# Patient Record
Sex: Female | Born: 1940
Health system: Southern US, Community
[De-identification: ages and names within clinical notes are randomized; demographics above are authoritative.]

## PROBLEM LIST (undated history)

## (undated) DIAGNOSIS — H409 Unspecified glaucoma: Secondary | ICD-10-CM

## (undated) DIAGNOSIS — R002 Palpitations: Secondary | ICD-10-CM

## (undated) DIAGNOSIS — J189 Pneumonia, unspecified organism: Secondary | ICD-10-CM

## (undated) DIAGNOSIS — F419 Anxiety disorder, unspecified: Secondary | ICD-10-CM

## (undated) DIAGNOSIS — K219 Gastro-esophageal reflux disease without esophagitis: Secondary | ICD-10-CM

## (undated) DIAGNOSIS — G43909 Migraine, unspecified, not intractable, without status migrainosus: Secondary | ICD-10-CM

## (undated) DIAGNOSIS — I499 Cardiac arrhythmia, unspecified: Secondary | ICD-10-CM

## (undated) DIAGNOSIS — Z87442 Personal history of urinary calculi: Secondary | ICD-10-CM

## (undated) DIAGNOSIS — E785 Hyperlipidemia, unspecified: Secondary | ICD-10-CM

## (undated) DIAGNOSIS — N390 Urinary tract infection, site not specified: Secondary | ICD-10-CM

## (undated) HISTORY — PX: TONSILLECTOMY: SUR1361

## (undated) HISTORY — PX: ABDOMINAL HYSTERECTOMY: SHX81

## (undated) HISTORY — PX: OTHER SURGICAL HISTORY: SHX169

## (undated) HISTORY — DX: Migraine, unspecified, not intractable, without status migrainosus: G43.909

## (undated) HISTORY — PX: EYE SURGERY: SHX253

## (undated) HISTORY — DX: Hyperlipidemia, unspecified: E78.5

## (undated) HISTORY — PX: COLONOSCOPY: SHX174

## (undated) HISTORY — DX: Urinary tract infection, site not specified: N39.0

## (undated) HISTORY — PX: ROTATOR CUFF REPAIR: SHX139

## (undated) HISTORY — DX: Palpitations: R00.2

## (undated) HISTORY — PX: LITHOTRIPSY: SUR834

## (undated) HISTORY — PX: FOOT SURGERY: SHX648

---

## 1985-06-19 HISTORY — PX: BACK SURGERY: SHX140

## 2010-04-12 LAB — PULMONARY FUNCTION TEST

## 2010-06-08 ENCOUNTER — Emergency Department (HOSPITAL_BASED_OUTPATIENT_CLINIC_OR_DEPARTMENT_OTHER)
Admission: EM | Admit: 2010-06-08 | Discharge: 2010-06-08 | Payer: Self-pay | Source: Home / Self Care | Admitting: Emergency Medicine

## 2010-07-14 ENCOUNTER — Ambulatory Visit
Admission: RE | Admit: 2010-07-14 | Discharge: 2010-07-14 | Payer: Self-pay | Source: Home / Self Care | Attending: Family Medicine | Admitting: Family Medicine

## 2010-07-14 ENCOUNTER — Other Ambulatory Visit: Payer: Self-pay | Admitting: Family Medicine

## 2010-07-14 ENCOUNTER — Encounter: Payer: Self-pay | Admitting: Family Medicine

## 2010-07-14 DIAGNOSIS — E119 Type 2 diabetes mellitus without complications: Secondary | ICD-10-CM | POA: Insufficient documentation

## 2010-07-14 DIAGNOSIS — H409 Unspecified glaucoma: Secondary | ICD-10-CM | POA: Insufficient documentation

## 2010-07-14 DIAGNOSIS — F418 Other specified anxiety disorders: Secondary | ICD-10-CM | POA: Insufficient documentation

## 2010-07-14 DIAGNOSIS — J42 Unspecified chronic bronchitis: Secondary | ICD-10-CM | POA: Insufficient documentation

## 2010-07-14 DIAGNOSIS — J309 Allergic rhinitis, unspecified: Secondary | ICD-10-CM | POA: Insufficient documentation

## 2010-07-14 DIAGNOSIS — E785 Hyperlipidemia, unspecified: Secondary | ICD-10-CM | POA: Insufficient documentation

## 2010-07-14 LAB — HEMOGLOBIN A1C: Hgb A1c MFr Bld: 6.9 % — ABNORMAL HIGH (ref 4.6–6.5)

## 2010-07-14 LAB — BASIC METABOLIC PANEL
CO2: 25 mEq/L (ref 19–32)
Chloride: 102 mEq/L (ref 96–112)
Potassium: 4.2 mEq/L (ref 3.5–5.1)

## 2010-07-14 LAB — TSH: TSH: 1.59 u[IU]/mL (ref 0.35–5.50)

## 2010-07-18 ENCOUNTER — Telehealth (INDEPENDENT_AMBULATORY_CARE_PROVIDER_SITE_OTHER): Payer: Self-pay | Admitting: *Deleted

## 2010-07-20 ENCOUNTER — Telehealth: Payer: Self-pay | Admitting: Family Medicine

## 2010-07-27 NOTE — Progress Notes (Signed)
Summary: labs  Phone Note Outgoing Call   Call placed by: Doristine Devoid CMA,  July 18, 2010 1:36 PM Call placed to: Patient Summary of Call: A1C of 6.9 is not ideal- should increase Actoplus met to 15/850 two times a day.  Follow-up for Phone Call        spoke w/ patient aware of labs and change in medication........Marland KitchenDoristine Devoid CMA  July 18, 2010 1:36 PM     New/Updated Medications: ACTOPLUS MET 15-850 MG TABS (PIOGLITAZONE HCL-METFORMIN HCL) take one tablet daily Prescriptions: ACTOPLUS MET 15-850 MG TABS (PIOGLITAZONE HCL-METFORMIN HCL) take one tablet daily  #60 x 2   Entered by:   Doristine Devoid CMA   Authorized by:   Neena Rhymes MD   Signed by:   Doristine Devoid CMA on 07/18/2010   Method used:   Electronically to        [facility] (retail)       [address]       [place], [place]  [address]       Ph: [phone]       Fax: [phone]   RxID:   [id number]

## 2010-07-27 NOTE — Progress Notes (Signed)
Summary: should she have PT referral??  Phone Note Call from Patient Call back at Home Phone [phone]   Caller: Spouse Summary of Call: Patient's spouse called to check on his referrals---said that patient should have a PT from [facility] in her list of referrals---I didnt see anything listed Initial call taken by: Jerolyn Shin,  July 20, 2010 3:11 PM  Follow-up for Phone Call        Pls advise .............Marland KitchenFelecia Deloach CMA  July 20, 2010 3:33 PM   Additional Follow-up for Phone Call Additional follow up Details #1::        At their visits we discussed MR Cirrincione having PT referral.  we did not discuss her having or needing PT referral.  not sure what she would like this for. Additional Follow-up by: Neena Rhymes MD,  July 20, 2010 3:42 PM  New Problems: UNSPECIFIED FALL (ICD-E888.9)   Additional Follow-up for Phone Call Additional follow up Details #2::    Pt states that she is having difficulty with balance that she fail to mention at OV. Pt notes that she fell this AM in shower and bruise arm, but is fine other than that. Pt states that she has spoken with PT and they inform her that they could help her with balancing. Pls advise.Marland KitchenMarland KitchenFelecia Deloach CMA  July 20, 2010 5:06 PM   Additional Follow-up for Phone Call Additional follow up Details #3:: Details for Additional Follow-up Action Taken: ok for PT referral to work on balance.  dx- falls.  Pt aware, awaiting appt info...........Marland KitchenFelecia Deloach CMA  July 21, 2010 9:14 AM  Additional Follow-up by: Neena Rhymes MD,  July 21, 2010 8:03 AM  New Problems: UNSPECIFIED FALL (ICD-E888.9)

## 2010-07-27 NOTE — Assessment & Plan Note (Signed)
Summary: NEW TO EST/MEDICARE NO CPX/KN   Vital Signs:  Patient profile:   70 year old female Height:      63.25 inches Weight:      191 pounds BMI:     33.69 Pulse rate:   68 / minute BP sitting:   124 / 72  (left arm)  Vitals Entered By: Doristine Devoid CMA (July 14, 2010 10:17 AM) CC: NEW EST- new to area no cpx    History of Present Illness: 70 yo woman here today to establish care.  recently moved from [place].  1) DM- on Actoplust Met.  rarely checking sugars.  follows ADA diet.  recently started doing water aerobics 3x/week and fitness program.  last had blood work- Sept.  no symptomatic lows.  no CP, SOB, edema.  2) Elevated Triglycerides- on Tricor.  no abd pain, N/V/D  3) Chronic Bronchitis- on Advair.  no SOB, no cough.  had a lot more problems in [place] due to pollution.  4) allergic rhinitis- on nasonex, allegra D.  sxs well controlled.  has not experienced Spring in [place].  5) Depression- on Venlafaxine.  sxs well controlled.  pt reports that depression is more of a 'short temper' issue and that meds 'keep me more even keeled'.  6) Glaucoma- previously on alphagan but developed an intolerance.  needs local ophthamologist.  Preventive Screening-Counseling & Management  Alcohol-Tobacco     Alcohol drinks/day: <1     Smoking Status: never  Caffeine-Diet-Exercise     Does Patient Exercise: yes      Sexual History:  currently monogamous.        Drug Use:  never.    Current Medications (verified): 1)  Allegra-D Allergy & Congestion 180-240 Mg Xr24h-Tab (Fexofenadine-Pseudoephedrine) .... Take One Tablet Daily 2)  Piroxicam 20 Mg Caps (Piroxicam) .... Take One Tablet Daiy As Needed 3)  Actoplus Met 15-850 Mg Tabs (Pioglitazone Hcl-Metformin Hcl) .... Take One Tablet Daily 4)  Tricor 145 Mg Tabs (Fenofibrate) .... Take One Tablet Daily 5)  Venlafaxine Hcl 75 Mg Tabs (Venlafaxine Hcl) .... Take One Tablet Daily 6)  Advair Diskus 250-50 Mcg/dose Aepb  (Fluticasone-Salmeterol) .Marland Kitchen.. 1 Dose Two Times A Day 7)  Nasonex 50 Mcg/act Susp (Mometasone Furoate) .... 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 No apparent alopecia or balding. Eyes:  PERRL, EOMI Neck:  No deformities, masses, or tenderness noted. Lungs:  Normal respiratory effort, chest expands symmetrically. Lungs are clear to auscultation, no crackles or wheezes. Heart:  Normal rate and regular rhythm. S1 and S2 normal without gallop, murmur, click, rub or other extra sounds. Abdomen:  soft, NT/ND, +BS Pulses:  +2 carotid, radial, DP Extremities:  no C/C/E Neurologic:  alert & oriented X3, cranial nerves II-XII intact, strength normal in all extremities, gait normal, and DTRs symmetrical and normal.   Skin:  turgor normal and color normal.   Cervical Nodes:  No lymphadenopathy noted Psych:  Oriented X3, memory intact for recent and remote, normally interactive, good eye contact, not anxious appearing, and not depressed appearing.     Impression & Recommendations:  Problem # 1:  DIABETES MELLITUS, TYPE II (ICD-250.00) Assessment New check A1C and adjust meds as needed.  pt asymptomatic.  not checking sugars.  recently started  exercising.  applauded her efforts.  will refer for eye exam. Her updated medication list for this problem includes:    Actoplus Met 15-850 Mg Tabs (Pioglitazone hcl-metformin hcl) .Marland Kitchen... Take one tablet daily  Orders: Venipuncture (40981) Specimen Handling (19147) TLB-A1C / Hgb A1C (Glycohemoglobin) (83036-A1C) TLB-BMP (Basic Metabolic Panel-BMET) (80048-METABOL) TLB-TSH (Thyroid Stimulating Hormone) (84443-TSH)  Problem # 2:  HYPERTRIGLYCERIDEMIA (ICD-272.1) Assessment: New  not fasting today.  will need to get records from previous MD.  continue current tx. Her updated medication list for this problem includes:    Tricor 145 Mg Tabs (Fenofibrate) .Marland Kitchen... Take one tablet daily  Orders: Prescription Created Electronically [phone])  Problem # 3:  BRONCHITIS, CHRONIC (ICD-491.9) Assessment: New sxs well controlled on Advair.  will need to watch closely as we progress into spring as pt may have increased difficulty w/ allergic triggers.  Problem # 4:  ALLERGIC RHINITIS (ICD-477.9) Assessment: New Pt on Allegra D and Nasonex.  sxs currently well controlled.  may have increased problems as spring approaches. Her updated medication list for this problem includes:    Nasonex 50 Mcg/act Susp (Mometasone furoate) ..... Use once daily  Problem # 5:  DEPRESSION (ICD-311) Assessment: New doing well on current meds.  no changes at this time. Her updated medication list for this problem includes:    Venlafaxine Hcl 75 Mg Tabs (Venlafaxine hcl) .Marland Kitchen... Take one tablet daily  Problem # 6:  GLAUCOMA (ICD-365.9) Assessment: New  pt in need of local ophtho.  will refer.  Orders: Ophthalmology Referral (Ophthalmology)  Complete Medication List: 1)  Allegra-d Allergy & Congestion 180-240 Mg Xr24h-tab (Fexofenadine-pseudoephedrine) .... Take one tablet daily 2)  Piroxicam 20 Mg Caps (Piroxicam) .... Take one tablet daiy as needed 3)  Actoplus Met 15-850 Mg Tabs (Pioglitazone hcl-metformin hcl)  .... Take one tablet daily 4)  Tricor 145 Mg Tabs (Fenofibrate) .... Take one tablet daily 5)  Venlafaxine Hcl 75 Mg Tabs (Venlafaxine hcl) .... Take one tablet daily 6)  Advair Diskus 250-50 Mcg/dose Aepb (Fluticasone-salmeterol) .Marland Kitchen.. 1 dose two times a day 7)  Nasonex 50 Mcg/act Susp (Mometasone furoate) .... Use once daily  Patient Instructions: 1)  We'll notify you of your lab results and make any adjustments to meds as needed 2)  Plan on following up in 3-4 months for diabetes 3)  We'll refer you to your specialists 4)  Call with any questions or concerns 5)  Welcome!  We're glad to have you! Prescriptions: NASONEX 50 MCG/ACT SUSP (MOMETASONE FUROATE) use once daily  #1 x 3   Entered and Authorized by:   Neena Rhymes MD   Signed by:   Neena Rhymes MD on 07/19/2010   Method used:   Electronically to        [facility] (retail)       [address]       [place], [place]  [address]       Ph: [phone]       Fax: [phone]   RxID:   [id number]  ADVAIR DISKUS 250-50 MCG/DOSE AEPB (FLUTICASONE-SALMETEROL) 1 dose two times a day  #1 x 3   Entered and Authorized by:   Neena Rhymes MD   Signed by:   Neena Rhymes MD on 07/19/2010   Method used:   Electronically to        [facility] (retail)       [address]       [place], [place]  [address]       Ph: [phone]       Fax: [phone]   RxID:   [id number] VENLAFAXINE HCL 75 MG TABS (VENLAFAXINE HCL) take one tablet daily  #30 x 3   Entered and Authorized by:   Neena Rhymes MD   Signed by:   Neena Rhymes MD on 07/19/2010   Method used:   Electronically to        [facility] (retail)       [address]       [place], [place]  [address]       Ph: [phone]       Fax: [phone]   RxID:   [id number] TRICOR 145 MG TABS (FENOFIBRATE) take one tablet daily  #30 x 3   Entered and  Authorized by:   Neena Rhymes MD   Signed by:   Neena Rhymes MD on 07/19/2010   Method used:   Electronically to        [facility] (retail)       [address]       [place], [place]  [address]       Ph: [phone]       Fax: [phone]   RxID:   [id number] PIROXICAM 20 MG CAPS (PIROXICAM) take one tablet daiy as needed  #30 x 3   Entered and Authorized by:   Neena Rhymes MD   Signed by:   Neena Rhymes MD on 07/19/2010   Method used:   Electronically to        [facility] (retail)       [address]       [place], [place]  [address]       Ph: [phone]       Fax: [phone]   RxID:   [id number]    Orders Added: 1)  Venipuncture [82423] 2)  Specimen Handling [99000] 3)  TLB-A1C / Hgb A1C (Glycohemoglobin) [83036-A1C] 4)  TLB-BMP (Basic Metabolic Panel-BMET) [80048-METABOL] 5)  TLB-TSH (Thyroid Stimulating Hormone) [84443-TSH] 6)  Ophthalmology Referral [Ophthalmology] 7)  New Patient Level III [53614] 8)  Prescription Created Electronically [phone]   Immunization History:  Pneumovax Immunization History:    Pneumovax:  historical (04/19/2010)   Immunization History:  Pneumovax Immunization History:    Pneumovax:  Historical (04/19/2010)

## 2010-07-29 ENCOUNTER — Encounter: Payer: Self-pay | Admitting: Family Medicine

## 2010-08-04 ENCOUNTER — Encounter: Payer: Self-pay | Admitting: Family Medicine

## 2010-08-04 NOTE — Progress Notes (Signed)
Summary: List of Physicians provided by patient  List of Physicians provided by patient   Imported By: Maryln Gottron 07/25/2010 12:20:53  _____________________________________________________________________  External Attachment:    Type:   Image     Comment:   External Document

## 2010-08-05 ENCOUNTER — Ambulatory Visit: Payer: Self-pay | Admitting: Family Medicine

## 2010-08-05 ENCOUNTER — Ambulatory Visit (INDEPENDENT_AMBULATORY_CARE_PROVIDER_SITE_OTHER): Payer: Medicare Other | Admitting: Family Medicine

## 2010-08-05 ENCOUNTER — Encounter: Payer: Self-pay | Admitting: Family Medicine

## 2010-08-05 DIAGNOSIS — J019 Acute sinusitis, unspecified: Secondary | ICD-10-CM

## 2010-08-10 NOTE — Assessment & Plan Note (Signed)
Summary: possible sinus infection/kn---LMOM to call back and resch///sph   Vital Signs:  Patient profile:   70 year old female Weight:      193.0 pounds Temp:     98.4 degrees F oral BP sitting:   114 / 60  (right arm) Cuff size:   large  Vitals Entered By: Almeta Monas CMA Duncan Dull) (August 05, 2010 10:14 AM) CC: x3days c/o sinus inf, URI symptoms   History of Present Illness:       This is a 70 year old woman who presents with URI symptoms.  b/l  sinus ha.  The patient complains of nasal congestion, purulent nasal discharge, productive cough, earache, and sick contacts.  The patient denies fever, low-grade fever (<100.5 degrees), fever of 100.5-103 degrees, fever of 103.1-104 degrees, fever to >104 degrees, stiff neck, dyspnea, wheezing, rash, vomiting, diarrhea, use of an antipyretic, and response to antipyretic.  The patient also reports headache.  The patient denies the following risk factors for Strep sinusitis: unilateral facial pain, unilateral nasal discharge, poor response to decongestant, double sickening, tooth pain, Strep exposure, tender adenopathy, and absence of cough.    Current Medications (verified): 1)  Allegra-D Allergy & Congestion 180-240 Mg Xr24h-Tab (Fexofenadine-Pseudoephedrine) .... Take One Tablet Daily 2)  Piroxicam 20 Mg Caps (Piroxicam) .... Take One Tablet Daiy As Needed 3)  Actoplus Met 15-850 Mg Tabs (Pioglitazone Hcl-Metformin Hcl) .... Take One Tablet Daily 4)  Tricor 145 Mg Tabs (Fenofibrate) .... Take One Tablet Daily 5)  Venlafaxine Hcl 75 Mg Tabs (Venlafaxine Hcl) .... Take One Tablet Daily 6)  Advair Diskus 250-50 Mcg/dose Aepb (Fluticasone-Salmeterol) .Marland Kitchen.. 1 Dose Two Times A Day 7)  Nasonex 50 Mcg/act Susp (Mometasone Furoate) .... Use Once Daily 8)  Ceftin 500 Mg Tabs (Cefuroxime Axetil) .Marland Kitchen.. 1 By Mouth Two Times A Day 9)  Flonase 50 Mcg/act Susp (Fluticasone Propionate) .... 2 Sprays Each Nostril Once Daily  Allergies (verified): No Known  Drug Allergies  Past History:  Past medical, surgical, family and social histories (including risk factors) reviewed for relevance to current acute and chronic problems.  Past Medical History: Reviewed history from 07/14/2010 and no changes required. hx of chicken pox Allergic rhinitis Diabetes mellitus, type II UTI  Migraines   Past Surgical History: Reviewed history from 07/14/2010 and no changes required. shoulder Hysterectomy-ovaries remain Tonsillectomy  Family History: Reviewed history from 07/14/2010 and no changes required. CAD-no HTN-no DM-father STROKE-no COLON CA-no BREAST CA-no  Social History: Reviewed history from 07/14/2010 and no changes required. 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  Otoscopic examination reveals clear canals, tympanic membranes are intact bilaterally without bulging, retraction, inflammation or discharge. Hearing is grossly normal bilaterally. Nose:  mucosal erythema, mucosal edema, L frontal sinus tenderness, L maxillary sinus tenderness, R frontal sinus tenderness, and R maxillary sinus tenderness.   Mouth:  Oral mucosa and oropharynx without lesions or exudates.  Teeth in good repair.   Impression & Recommendations:  Problem # 1:  SINUSITIS - ACUTE-NOS (ICD-461.9)  Her updated medication list for this problem includes:    Allegra-d Allergy & Congestion 180-240 Mg Xr24h-tab (Fexofenadine-pseudoephedrine) .Marland Kitchen... Take one tablet daily    Nasonex 50 Mcg/act Susp (Mometasone furoate) ..... Use once daily  Ceftin 500 Mg Tabs (Cefuroxime axetil) .Marland Kitchen... 1 by mouth two times a day    Flonase 50 Mcg/act Susp (Fluticasone propionate) .Marland Kitchen... 2 sprays each  nostril once daily  Instructed on treatment. Call if symptoms persist or worsen.   Complete Medication List: 1)  Allegra-d Allergy & Congestion 180-240 Mg Xr24h-tab (Fexofenadine-pseudoephedrine) .... Take one tablet daily 2)  Piroxicam 20 Mg Caps (Piroxicam) .... Take one tablet daiy as needed 3)  Actoplus Met 15-850 Mg Tabs (Pioglitazone hcl-metformin hcl) .... Take one tablet daily 4)  Tricor 145 Mg Tabs (Fenofibrate) .... Take one tablet daily 5)  Venlafaxine Hcl 75 Mg Tabs (Venlafaxine hcl) .... Take one tablet daily 6)  Advair Diskus 250-50 Mcg/dose Aepb (Fluticasone-salmeterol) .Marland Kitchen.. 1 dose two times a day 7)  Nasonex 50 Mcg/act Susp (Mometasone furoate) .... Use once daily 8)  Ceftin 500 Mg Tabs (Cefuroxime axetil) .Marland Kitchen.. 1 by mouth two times a day 9)  Flonase 50 Mcg/act Susp (Fluticasone propionate) .... 2 sprays each nostril once daily Prescriptions: FLONASE 50 MCG/ACT SUSP (FLUTICASONE PROPIONATE) 2 sprays each nostril once daily  #1 x 2   Entered and Authorized by:   Loreen Freud DO   Signed by:   Loreen Freud DO on 08/05/2010   Method used:   Faxed to ...       [facility] (retail)       [phone]       [place], [place]  [address]       Ph: [phone]       Fax: [phone]   RxID:   [id number] CEFTIN 500 MG TABS (CEFUROXIME AXETIL) 1 by mouth two times a day  #20 x 0   Entered and Authorized by:   Loreen Freud DO   Signed by:   Loreen Freud DO on 08/05/2010   Method used:   Faxed to ...       [facility] (retail)       [phone]       [place], [place]  [address]       Ph: [phone]       Fax: [phone]   RxID:   [id number]    Orders Added: 1)  Est. Patient 18-39 years [phone]

## 2010-08-10 NOTE — Miscellaneous (Signed)
Summary: Orders, Initial Evaluation/[facility]  Orders, Initial Evaluation/[facility]   Imported By: Maryln Gottron 08/03/2010 14:23:44  _____________________________________________________________________  External Attachment:    Type:   Image     Comment:   External Document

## 2010-08-16 NOTE — Miscellaneous (Signed)
Summary: PT Evaluation/[facility]  PT Evaluation/[facility]   Imported By: Maryln Gottron 08/09/2010 14:58:07  _____________________________________________________________________  External Attachment:    Type:   Image     Comment:   External Document

## 2010-08-18 ENCOUNTER — Encounter: Payer: Self-pay | Admitting: Family Medicine

## 2010-08-19 ENCOUNTER — Telehealth (INDEPENDENT_AMBULATORY_CARE_PROVIDER_SITE_OTHER): Payer: Self-pay | Admitting: *Deleted

## 2010-08-25 NOTE — Progress Notes (Signed)
Summary: patient to pick up records from dr wyll eye dr   Phone Note Call from Patient   Caller: Patient Summary of Call: patient called inquiring about records from dr wyll & wanted a copy to take to dr digby - i checked with kelly who was with dr Beverely Low she said there were no records from that dr - the patient was very upset - she asked me to call his office - i called dr wylls office [phone] & spoke with crystal she said records were sent [phone] - she said she could send the records again but the original photos were mail the first time - looked through records on back shelf & found them -   chemira had photos in dr [facility] area -- i called the patient & told her records & photo ready for her to pick up Initial call taken by: Okey Regal [place],  August 19, 2010 10:39 AM

## 2010-08-29 LAB — COMPREHENSIVE METABOLIC PANEL
AST: 23 U/L (ref 0–37)
Albumin: 4.7 g/dL (ref 3.5–5.2)
BUN: 20 mg/dL (ref 6–23)
Calcium: 10 mg/dL (ref 8.4–10.5)
Creatinine, Ser: 0.8 mg/dL (ref 0.4–1.2)
GFR calc non Af Amer: >60 mL/min (ref >60)
Glucose, Bld: 78 mg/dL (ref 70–99)

## 2010-09-06 NOTE — Miscellaneous (Signed)
Summary: OT Discharge Summary/[facility]  OT Discharge Summary/[facility]   Imported By: Maryln Gottron 08/31/2010 12:50:19  _____________________________________________________________________  External Attachment:    Type:   Image     Comment:   External Document

## 2010-09-16 ENCOUNTER — Ambulatory Visit (INDEPENDENT_AMBULATORY_CARE_PROVIDER_SITE_OTHER): Payer: Medicare Other | Admitting: Family Medicine

## 2010-09-16 ENCOUNTER — Encounter: Payer: Self-pay | Admitting: Family Medicine

## 2010-09-16 VITALS — BP 130/86 | Temp 97.3°F | Ht 63.25 in | Wt 193.0 lb

## 2010-09-16 DIAGNOSIS — R42 Dizziness and giddiness: Secondary | ICD-10-CM

## 2010-09-16 LAB — CBC WITH DIFFERENTIAL/PLATELET
Basophils Absolute: 0 10*3/uL (ref 0.0–0.1)
Basophils Relative: 0.7 % (ref 0.0–3.0)
Eosinophils Absolute: 0.2 10*3/uL (ref 0.0–0.7)
HCT: 36.2 % (ref 36.0–46.0)
Hemoglobin: 12.3 g/dL (ref 12.0–15.0)
Monocytes Relative: 8 % (ref 3.0–12.0)
Neutrophils Relative %: 54.4 % (ref 43.0–77.0)
Platelets: 316 10*3/uL (ref 150.0–400.0)
RBC: 4.07 Mil/uL (ref 3.87–5.11)
WBC: 5.9 10*3/uL (ref 4.5–10.5)

## 2010-09-16 NOTE — Patient Instructions (Signed)
Schedule a diabetes follow up for the end of April Make sure you increase your water intake Change positions slowly to allow your body time to adjust If no improvement in the next 7-10 days, CALL ME and we'll send you to a neurologist for evaluation Call with any questions or concerns Hang in there!!!

## 2010-09-16 NOTE — Progress Notes (Signed)
  Subjective:    Patient ID: [name], female    DOB: February 22, 1941, 70 y.o.   MRN: [id number]  HPI Dizziness- 3 episodes since Oct. 2 nights ago woke from sleep and had difficulty maintaining balance on way to bathroom.  Dizziness extended into morning almost through lunch.  Pt describes if as 'an off balanced' feeling.  Had sensation of 'my eyes spinning' w/ some nausea.  Had 15 oz of wine in 3 hrs the evening before.  sxs improved by early afternoon spontaneously.  Ate breakfast.   Is walking regularly, taking golf lessons, doing core work.  Denies current sxs.  No focal weakness or numbness.   Review of Systems For ROS see HPI     Objective:   Physical Exam  Constitutional: She is oriented to person, place, and time. She appears well-developed and well-nourished. No distress.  HENT:  Head: Normocephalic and atraumatic.  Right Ear: Tympanic membrane normal.  Left Ear: Tympanic membrane normal.  Eyes: Conjunctivae and EOM are normal. Pupils are equal, round, and reactive to light. Right eye exhibits no nystagmus. Left eye exhibits no nystagmus.  Cardiovascular: Normal rate, regular rhythm, normal heart sounds and intact distal pulses.   Pulmonary/Chest: Effort normal and breath sounds normal. No respiratory distress.  Neurological: She is alert and oriented to person, place, and time. She has normal strength and normal reflexes. No cranial nerve deficit. She displays a negative Romberg sign. Coordination and gait normal.          Assessment & Plan:

## 2010-09-27 NOTE — Assessment & Plan Note (Signed)
Pt's sxs have resolved.  Dizziness was likely due to wine intake, decreased water intake and mild hang over.  Normal neuro exam today.  Reviewed supportive care and red flags that should prompt return.  Pt expressed understanding and is in agreement w/ plan.

## 2010-12-09 ENCOUNTER — Other Ambulatory Visit: Payer: Self-pay | Admitting: Family Medicine

## 2010-12-09 NOTE — Telephone Encounter (Signed)
RX done by MD. 

## 2010-12-09 NOTE — Telephone Encounter (Signed)
Refill sent, pt appears to be overdue for appt.

## 2010-12-09 NOTE — Telephone Encounter (Signed)
This appears to have never been given by our office. Please advise.

## 2010-12-12 ENCOUNTER — Encounter: Payer: Self-pay | Admitting: Family Medicine

## 2010-12-12 ENCOUNTER — Ambulatory Visit (INDEPENDENT_AMBULATORY_CARE_PROVIDER_SITE_OTHER): Payer: Medicare Other | Admitting: Family Medicine

## 2010-12-12 DIAGNOSIS — R413 Other amnesia: Secondary | ICD-10-CM

## 2010-12-12 DIAGNOSIS — R232 Flushing: Secondary | ICD-10-CM | POA: Insufficient documentation

## 2010-12-12 DIAGNOSIS — N951 Menopausal and female climacteric states: Secondary | ICD-10-CM

## 2010-12-12 DIAGNOSIS — Z1231 Encounter for screening mammogram for malignant neoplasm of breast: Secondary | ICD-10-CM | POA: Insufficient documentation

## 2010-12-12 DIAGNOSIS — E119 Type 2 diabetes mellitus without complications: Secondary | ICD-10-CM

## 2010-12-12 LAB — CBC WITH DIFFERENTIAL/PLATELET
Basophils Relative: 0.5 % (ref 0.0–3.0)
Hemoglobin: 12 g/dL (ref 12.0–15.0)
Lymphocytes Relative: 24.3 % (ref 12.0–46.0)
Lymphs Abs: 1.7 10*3/uL (ref 0.7–4.0)
Monocytes Absolute: 0.4 10*3/uL (ref 0.1–1.0)
Neutro Abs: 4.6 10*3/uL (ref 1.4–7.7)
Neutrophils Relative %: 66.1 % (ref 43.0–77.0)
RBC: 4.01 Mil/uL (ref 3.87–5.11)
WBC: 7 10*3/uL (ref 4.5–10.5)

## 2010-12-12 LAB — HEMOGLOBIN A1C: Hgb A1c MFr Bld: 6.9 % — ABNORMAL HIGH (ref 4.6–6.5)

## 2010-12-12 LAB — BASIC METABOLIC PANEL
Chloride: 102 mEq/L (ref 96–112)
Creatinine, Ser: 1.1 mg/dL (ref 0.4–1.2)

## 2010-12-12 LAB — TSH: TSH: 2.51 u[IU]/mL (ref 0.35–5.50)

## 2010-12-12 NOTE — Progress Notes (Signed)
  Subjective:    Patient ID: [name], female    DOB: [birth year], 70 y.o.   MRN: [id number]  HPI DM- overdue for labs, chronic problem for pt.  On Actoplus Met.  Has not been checking sugars.  Exercising regularly.  Hot Flashes- had sxs for 20 yrs after hysterectomy.  For last 10 yrs has not had sxs.  sxs are occurring 1-2x/day.  sxs started ~1 month ago.  Taking effexor regularly.  Denies fevers.  Increased fatigue.  Memory- pt has family hx of dementia.  Got confused the other morning when she and her husband had different schedules.  Reports she is forgetting names (not of those close to her).  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   Review of Systems For ROS see HPI     Objective:   Physical Exam  Constitutional: She is oriented to person, place, and time. She appears well-developed and well-nourished. No distress.  HENT:  Head: Normocephalic and atraumatic.  Eyes: Conjunctivae and EOM are normal. Pupils are equal, round, and reactive to light.  Neck: Normal range of motion. Neck supple. No thyromegaly present.  Cardiovascular: Normal rate, regular rhythm, normal heart sounds and intact distal pulses.   No murmur heard. Pulmonary/Chest: Effort normal and breath sounds normal. No respiratory distress.  Abdominal: Soft. She exhibits no distension. There is no tenderness.  Musculoskeletal: She exhibits no edema.  Lymphadenopathy:    She has no cervical adenopathy.  Neurological: She is alert and oriented to person, place, and time. She has normal reflexes. No cranial nerve deficit. Coordination normal.  Skin: Skin is warm and dry.  Psychiatric: She has a normal mood and affect. Her behavior is normal. Judgment and thought content normal.          Assessment & Plan:

## 2010-12-12 NOTE — Patient Instructions (Signed)
We will notify you of your lab results Please start checking your sugars- especially if you are feeling hot We'll call you with your neuro appt and mammogram Call with any questions or concerns Hang in there!

## 2010-12-13 NOTE — Assessment & Plan Note (Signed)
Due for mammo- needs local referral since moving from [place].

## 2010-12-13 NOTE — Assessment & Plan Note (Signed)
Overdue for labs.  Pt is not checking sugars.  Since she is exercising regularly and per pt and husband's report eater 'lighter' due to the heat her hot flashes may actually be symptomatic lows.  Stressed importance of checking sugars- particularly when symptomatic.  Will check A1C and adjust meds prn.

## 2010-12-13 NOTE — Assessment & Plan Note (Signed)
Hot flashes at this stage would be unlikely to be hormonal in the traditional sense.  Given associated fatigue will check TSH to r/o thyroid involvement.  May be having symptomatic lows related to her diabetes (see above).  Will follow closely.

## 2010-12-13 NOTE — Assessment & Plan Note (Signed)
Pt w/out evidence of memory loss in office.  She is oriented x3, able to recall the names, birthdates, and other info of those close to her (accuracy confirmed by husband).  Husband reports that due to family hx of dementia she is not going to stop worrying about this until someone can prove it to her.  Will refer to neuro for complete assessment.

## 2010-12-19 ENCOUNTER — Other Ambulatory Visit: Payer: Self-pay | Admitting: Family Medicine

## 2010-12-19 NOTE — Telephone Encounter (Signed)
Refill sent, physical due. 

## 2010-12-26 ENCOUNTER — Ambulatory Visit (INDEPENDENT_AMBULATORY_CARE_PROVIDER_SITE_OTHER): Payer: Medicare Other | Admitting: Family Medicine

## 2010-12-26 VITALS — BP 112/70 | Temp 98.1°F | Wt 192.8 lb

## 2010-12-26 DIAGNOSIS — R232 Flushing: Secondary | ICD-10-CM

## 2010-12-26 DIAGNOSIS — N951 Menopausal and female climacteric states: Secondary | ICD-10-CM

## 2010-12-26 DIAGNOSIS — R002 Palpitations: Secondary | ICD-10-CM | POA: Insufficient documentation

## 2010-12-26 NOTE — Patient Instructions (Signed)
We will call you with your endocrinology appt We have ruled out an arrhythmia, low sugar, and possibly thyroid- this is the good news. We will wait and see what endocrinology has to say about it b/c it sounds hormonal (and this is their specialty) Call with any questions or concerns Hang in there!

## 2010-12-26 NOTE — Progress Notes (Signed)
  Subjective:    Patient ID: [name], female    DOB: [birth year], 70 y.o.   MRN: [id number]  HPI Hot flashes- reports sxs are worsening.  She had 7 episodes in 2 days.  Husband reports 'you can see the heat in her face'.  She says 'sweat just pours off me'.  A few times she felt that her heart was racing.  Notes that it most often occurs w/ some exertion, 'it's like when i stop doing something'.  No chest pain, SOB.  Reviewed CBGs- no dramatic lows or highs.   Review of Systems For ROS see HPI  Objective:   Physical Exam Constitutional: She is oriented to person, place, and time. She appears well-developed and well-nourished. No distress.  HENT:  Head: Normocephalic and atraumatic.  Eyes: Conjunctivae and EOM are normal. Pupils are equal, round, and reactive to light.  Neck: Normal range of motion. Neck supple. No thyromegaly present.  Cardiovascular: Normal rate, regular rhythm, normal heart sounds and intact distal pulses.   No murmur heard. Pulmonary/Chest: Effort normal and breath sounds normal. No respiratory distress.  Abdominal: Soft. She exhibits no distension. There is no tenderness.  Musculoskeletal: She exhibits no edema.  Lymphadenopathy:    She has no cervical adenopathy.  Neurological: She is alert and oriented to person, place, and time. She has normal reflexes. No cranial nerve deficit. Coordination normal.  Skin: Skin is warm and dry.  Psychiatric: She has a normal mood and affect. Her behavior is normal. Judgment and thought content normal.           Assessment & Plan:

## 2010-12-27 ENCOUNTER — Ambulatory Visit
Admission: RE | Admit: 2010-12-27 | Discharge: 2010-12-27 | Disposition: A | Discharge status: Home / Self Care | Payer: Medicare Other | Source: Ambulatory Visit | Attending: Family Medicine | Admitting: Family Medicine

## 2010-12-27 DIAGNOSIS — Z1231 Encounter for screening mammogram for malignant neoplasm of breast: Secondary | ICD-10-CM

## 2011-01-08 NOTE — Assessment & Plan Note (Signed)
Pt's sxs sound hormonal but pt is well past menopausal age and thyroid tests have been normal.  Not having low CBGs.  Will refer to endo for complete eval and tx.  Pt expressed understanding and is in agreement w/ plan.

## 2011-01-08 NOTE — Assessment & Plan Note (Signed)
Most likely related to her hot flashes.  No evidence of arrhythmia on EKG.

## 2011-01-18 ENCOUNTER — Encounter: Payer: Self-pay | Admitting: *Deleted

## 2011-01-19 ENCOUNTER — Encounter: Payer: Self-pay | Admitting: Cardiovascular Disease

## 2011-01-19 ENCOUNTER — Ambulatory Visit (INDEPENDENT_AMBULATORY_CARE_PROVIDER_SITE_OTHER): Payer: Medicare Other | Admitting: Cardiovascular Disease

## 2011-01-19 VITALS — BP 148/83 | HR 98 | Resp 16 | Ht 63.0 in | Wt 192.0 lb

## 2011-01-19 DIAGNOSIS — R002 Palpitations: Secondary | ICD-10-CM

## 2011-01-19 NOTE — Patient Instructions (Signed)
Your physician recommends that you schedule a follow-up appointment in:3 WEEKS WITH DR. Clifton James  Your physician has recommended that you wear a holter monitor for 48 hours. Holter monitors are medical devices that record the heart's electrical activity. Doctors most often use these monitors to diagnose arrhythmias. Arrhythmias are problems with the speed or rhythm of the heartbeat. The monitor is a small, portable device. You can wear one while you do your normal daily activities. This is usually used to diagnose what is causing palpitations/syncope (passing out).  Your physician has requested that you have an echocardiogram. Echocardiography is a painless test that uses sound waves to create images of your heart. It provides your doctor with information about the size and shape of your heart and how well your heart's chambers and valves are working. This procedure takes approximately one hour. There are no restrictions for this procedure.

## 2011-01-19 NOTE — Progress Notes (Signed)
History of Present Illness:70 yo female with history of DM, migraine headaches here today to establish cardiac care. She tells me that she has been having hot flashes. Her head and upper chest become hot. She then feels her heart race. No irregularity. She begins to sweat. No chest pain or SOB. These episodes occur 5-6 times daily. She notes constant fatigue and some dizziness when turning her head. She was seen by Dr. Beverely Low in primary care 3 weeks ago. EKG was normal at that time. She has been seen by endocrinology. She has no family history of CAD. She has no prior cardiac issues. No previous history of HTN. Normal stress test 5 years ago.   Past Medical History  Diagnosis Date  . Allergic rhinitis   . Diabetes mellitus   . Urinary tract infection   . Migraines   . Palpitations     Past Surgical History  Procedure Date  . Shoulder surgery   . Tonsillectomy   . Abdominal hysterectomy   . Deviated septum repair     Current Outpatient Prescriptions  Medication Sig Dispense Refill  . b complex vitamins capsule Take 1 capsule by mouth daily.        . bimatoprost (LUMIGAN) 0.03 % ophthalmic solution 1 drop at bedtime.        . fexofenadine-pseudoephedrine (ALLEGRA-D 24) 180-240 MG per 24 hr tablet Take 1 tablet by mouth daily.        . Fluticasone-Salmeterol (ADVAIR) 250-50 MCG/DOSE AEPB Inhale 1 puff into the lungs every 12 (twelve) hours.        . milk thistle 175 MG tablet Take 175 mg by mouth daily.        . mometasone (NASONEX) 50 MCG/ACT nasal spray 2 sprays by Nasal route daily as needed.        . pioglitazone-metformin (ACTOPLUS MET) 15-500 MG per tablet Take 1 tablet by mouth 2 (two) times daily with a meal.        . piroxicam (FELDENE) 20 MG capsule TAKE ONE CAPSULE DAILY AS NEEDED  30 capsule  0  . TRICOR 145 MG tablet TAKE ONE (1) TABLET EACH DAY  30 tablet  0  . venlafaxine (EFFEXOR) 75 MG tablet TAKE ONE (1) TABLET EACH DAY  30 tablet  0  . VITAMIN D, CHOLECALCIFEROL, PO  Take by mouth daily.          No Known Allergies  History   Social History  . Marital Status: Married    Spouse Name: N/A    Number of Children: N/A  . Years of Education: N/A   Occupational History  . Not on file.   Social History Main Topics  . Smoking status: Former Games developer  . Smokeless tobacco: Not on file   Comment: smoked in college years ago  . Alcohol Use: Yes     wine 4 nights per week  . Drug Use: No  . Sexually Active:    Other Topics Concern  . Not on file   Social History Narrative  . No narrative on file    Family History  Problem Relation Age of Onset  . Diabetes Father     Review of Systems:  As stated in the HPI and otherwise negative.   BP 148/83  Pulse 98  Resp 16  Ht 5\' 3"  (1.6 m)  Wt 192 lb (87.091 kg)  BMI 34.01 kg/m2  Physical Examination: General: Well developed, well nourished, NAD HEENT: OP clear, mucus membranes moist SKIN: warm,  dry. No rashes. Neuro: No focal deficits Musculoskeletal: Muscle strength 5/5 all ext Psychiatric: Mood and affect normal Neck: No JVD, no carotid bruits, no thyromegaly, no lymphadenopathy. Lungs:Clear bilaterally, no wheezes, rhonci, crackles Cardiovascular: Regular rate and rhythm. No murmurs, gallops or rubs. Abdomen:Soft. Bowel sounds present. Non-tender.  Extremities: No lower extremity edema. Pulses are 2 + in the bilateral DP/PT.  EKG reviewed from 12/26/10. NSR.

## 2011-01-19 NOTE — Assessment & Plan Note (Signed)
Will get 48 hour monitor. Will get echo to assess LV function. I do not think she needs an ischemic workup.

## 2011-01-20 ENCOUNTER — Ambulatory Visit (HOSPITAL_COMMUNITY): Payer: Medicare Other | Attending: Cardiovascular Disease | Admitting: Radiology

## 2011-01-20 DIAGNOSIS — Z87891 Personal history of nicotine dependence: Secondary | ICD-10-CM | POA: Insufficient documentation

## 2011-01-20 DIAGNOSIS — R5381 Other malaise: Secondary | ICD-10-CM | POA: Insufficient documentation

## 2011-01-20 DIAGNOSIS — R002 Palpitations: Secondary | ICD-10-CM | POA: Insufficient documentation

## 2011-01-20 DIAGNOSIS — I517 Cardiomegaly: Secondary | ICD-10-CM

## 2011-01-20 DIAGNOSIS — R5383 Other fatigue: Secondary | ICD-10-CM | POA: Insufficient documentation

## 2011-01-20 DIAGNOSIS — R42 Dizziness and giddiness: Secondary | ICD-10-CM | POA: Insufficient documentation

## 2011-01-20 DIAGNOSIS — E119 Type 2 diabetes mellitus without complications: Secondary | ICD-10-CM | POA: Insufficient documentation

## 2011-01-23 ENCOUNTER — Encounter (INDEPENDENT_AMBULATORY_CARE_PROVIDER_SITE_OTHER): Payer: Medicare Other

## 2011-01-23 DIAGNOSIS — R42 Dizziness and giddiness: Secondary | ICD-10-CM

## 2011-01-30 ENCOUNTER — Encounter: Payer: Self-pay | Admitting: Cardiovascular Disease

## 2011-01-31 ENCOUNTER — Telehealth: Payer: Self-pay | Admitting: Cardiology

## 2011-01-31 NOTE — Telephone Encounter (Signed)
Patient aware of her heart monitor results. She will f/u with Dr. Clifton James in a few weeks.

## 2011-02-13 ENCOUNTER — Other Ambulatory Visit: Payer: Self-pay | Admitting: Family Medicine

## 2011-02-13 MED ORDER — PIROXICAM 20 MG PO CAPS: 20.0000 mg | ORAL_CAPSULE | Freq: Every day | ORAL | Status: DC

## 2011-02-13 NOTE — Telephone Encounter (Signed)
Refill request.....Dr.Tabori's patient, please advise     KP

## 2011-02-14 ENCOUNTER — Ambulatory Visit: Payer: Medicare Other | Admitting: Cardiovascular Disease

## 2011-02-21 ENCOUNTER — Other Ambulatory Visit: Payer: Self-pay | Admitting: Family Medicine

## 2011-03-30 ENCOUNTER — Other Ambulatory Visit: Payer: Self-pay | Admitting: Family Medicine

## 2011-04-11 ENCOUNTER — Ambulatory Visit (INDEPENDENT_AMBULATORY_CARE_PROVIDER_SITE_OTHER): Payer: Medicare Other | Admitting: Family Medicine

## 2011-04-11 ENCOUNTER — Encounter: Payer: Self-pay | Admitting: Family Medicine

## 2011-04-11 DIAGNOSIS — E119 Type 2 diabetes mellitus without complications: Secondary | ICD-10-CM

## 2011-04-11 DIAGNOSIS — E781 Pure hyperglyceridemia: Secondary | ICD-10-CM

## 2011-04-11 DIAGNOSIS — Z23 Encounter for immunization: Secondary | ICD-10-CM

## 2011-04-11 DIAGNOSIS — Z0000 Encounter for general adult medical examination without abnormal findings: Secondary | ICD-10-CM | POA: Insufficient documentation

## 2011-04-11 LAB — TSH: TSH: 1.94 u[IU]/mL (ref 0.35–5.50)

## 2011-04-11 LAB — BASIC METABOLIC PANEL
BUN: 19 mg/dL (ref 6–23)
Chloride: 105 mEq/L (ref 96–112)
GFR: 69.04 mL/min (ref >60.00)
Glucose, Bld: 117 mg/dL — ABNORMAL HIGH (ref 70–99)
Sodium: 139 mEq/L (ref 135–145)

## 2011-04-11 LAB — HEPATIC FUNCTION PANEL
ALT: 22 U/L (ref 0–35)
Alkaline Phosphatase: 49 U/L (ref 39–117)
Bilirubin, Direct: 0 mg/dL (ref 0.0–0.3)
Total Protein: 7.6 g/dL (ref 6.0–8.3)

## 2011-04-11 LAB — LIPID PANEL
HDL: 51.8 mg/dL (ref >39.00)
LDL Cholesterol: 87 mg/dL (ref 0–99)
Total CHOL/HDL Ratio: 3

## 2011-04-11 LAB — CBC WITH DIFFERENTIAL/PLATELET
Basophils Absolute: 0 10*3/uL (ref 0.0–0.1)
HCT: 39.1 % (ref 36.0–46.0)
Lymphs Abs: 1.5 10*3/uL (ref 0.7–4.0)
MCHC: 34 g/dL (ref 30.0–36.0)
Platelets: 315 10*3/uL (ref 150.0–400.0)
RBC: 4.44 Mil/uL (ref 3.87–5.11)
RDW: 14.4 % (ref 11.5–14.6)

## 2011-04-11 LAB — HEMOGLOBIN A1C: Hgb A1c MFr Bld: 6.8 % — ABNORMAL HIGH (ref 4.6–6.5)

## 2011-04-11 NOTE — Progress Notes (Signed)
  Subjective:    Patient ID: [name], female    DOB: 03/24/1941, 70 y.o.   MRN: [id number]  HPI Here today for CPE.  Risk Factors: DM- chronic problem, typically well controlled.  On Actoplus Met.  Reports sugars have been well controlled 90s-120.  No CP, SOB, HAs, visual changes, edema. Hyperlipidemia- chronic problem, on Tricor daily.  Denies N/V, myalgias.  Physical Activity: very active, exercises regularly Fall risk: low risk, steady on feet Depression: ongoing issue for pt but well controlled on Effexor. Hearing: normal to whispered voice and conversational tones ADL's: independent Cognitive: normal linear thought process, memory and attention intact Home Safety: safe at home, lives w/ husband Height, Weight, BMI, Visual Acuity: see vitals, vision corrected to 20/20 w/ glasses Counseling: UTD on mammo and colonoscopy.  No need for pap due to TAH Labs Ordered: See A&P Care Plan: See A&P    Review of Systems Patient reports no vision/ hearing changes, adenopathy,fever, weight change,  persistant/recurrent hoarseness , swallowing issues, chest pain, palpitations, edema, persistant/recurrent cough, hemoptysis, dyspnea (rest/exertional/paroxysmal nocturnal), gastrointestinal bleeding (melena, rectal bleeding), abdominal pain, significant heartburn, bowel changes, GU symptoms (dysuria, hematuria, incontinence), Gyn symptoms (abnormal  bleeding, pain),  syncope, focal weakness, memory loss, numbness & tingling, skin/hair/nail changes, abnormal bruising or bleeding, anxiety, or depression.     Objective:   Physical Exam  General Appearance:    Alert, cooperative, no distress, appears stated age  Head:    Normocephalic, without obvious abnormality, atraumatic  Eyes:    PERRL, conjunctiva/corneas clear, EOM's intact, fundi    benign, both eyes  Ears:    Normal TM's and external ear canals, both ears  Nose:   Nares normal, septum midline, mucosa normal, no drainage    or sinus  tenderness  Throat:   Lips, mucosa, and tongue normal; teeth and gums normal  Neck:   Supple, symmetrical, trachea midline, no adenopathy;    Thyroid: no enlargement/tenderness/nodules  Back:     Symmetric, no curvature, ROM normal, no CVA tenderness  Lungs:     Clear to auscultation bilaterally, respirations unlabored  Chest Wall:    No tenderness or deformity   Heart:    Regular rate and rhythm, S1 and S2 normal, no murmur, rub   or gallop  Breast Exam:    No tenderness, masses, or nipple abnormality  Abdomen:     Soft, non-tender, bowel sounds active all four quadrants,    no masses, no organomegaly  Genitalia:    Deferred  Rectal:    Extremities:   Extremities normal, atraumatic, no cyanosis or edema  Pulses:   2+ and symmetric all extremities  Skin:   Skin color, texture, turgor normal, no rashes or lesions  Lymph nodes:   Cervical, supraclavicular, and axillary nodes normal  Neurologic:   CNII-XII intact, normal strength, sensation and reflexes    throughout          Assessment & Plan:

## 2011-04-11 NOTE — Patient Instructions (Signed)
Follow up in 3 months to recheck diabetes We'll notify you of your lab results and make any changes if needed Keep up the good work!  You look great!! Call with any questions or concerns Happy Holidays!!! 

## 2011-04-12 ENCOUNTER — Encounter: Payer: Self-pay | Admitting: *Deleted

## 2011-04-18 NOTE — Assessment & Plan Note (Signed)
Pt's PE WNL.  UTD on health maintenance.  Check labs.  Anticipatory guidance provided.  

## 2011-04-18 NOTE — Assessment & Plan Note (Signed)
Chronic problem for pt, tolerating med w/out difficulty.  Check labs.  Make med adjustments prn.

## 2011-04-18 NOTE — Assessment & Plan Note (Signed)
Chronic problem, typically well controlled.  Asymptomatic.  Check lab.  Adjust meds prn.

## 2011-04-26 ENCOUNTER — Other Ambulatory Visit: Payer: Self-pay | Admitting: Family Medicine

## 2011-06-30 DIAGNOSIS — H2510 Age-related nuclear cataract, unspecified eye: Secondary | ICD-10-CM | POA: Diagnosis not present

## 2011-07-13 ENCOUNTER — Encounter: Payer: Self-pay | Admitting: Family Medicine

## 2011-07-13 ENCOUNTER — Ambulatory Visit (INDEPENDENT_AMBULATORY_CARE_PROVIDER_SITE_OTHER): Payer: Medicare Other | Admitting: Family Medicine

## 2011-07-13 DIAGNOSIS — E119 Type 2 diabetes mellitus without complications: Secondary | ICD-10-CM

## 2011-07-13 DIAGNOSIS — R030 Elevated blood-pressure reading, without diagnosis of hypertension: Secondary | ICD-10-CM | POA: Diagnosis not present

## 2011-07-13 DIAGNOSIS — L989 Disorder of the skin and subcutaneous tissue, unspecified: Secondary | ICD-10-CM | POA: Diagnosis not present

## 2011-07-13 DIAGNOSIS — IMO0001 Reserved for inherently not codable concepts without codable children: Secondary | ICD-10-CM | POA: Insufficient documentation

## 2011-07-13 LAB — BASIC METABOLIC PANEL
BUN: 20 mg/dL (ref 6–23)
Calcium: 9.8 mg/dL (ref 8.4–10.5)
GFR: 57.97 mL/min — ABNORMAL LOW (ref >60.00)
Potassium: 4 mEq/L (ref 3.5–5.1)

## 2011-07-13 LAB — MICROALBUMIN / CREATININE URINE RATIO: Microalb, Ur: 0.8 mg/dL (ref 0.0–1.9)

## 2011-07-13 NOTE — Assessment & Plan Note (Signed)
New.  Pt needs tight BP control b/c of DM.  Discussed starting ACE/ARB for renal protection.  Pt not open to idea of taking additional med unless necessary.  Will continue to follow.

## 2011-07-13 NOTE — Assessment & Plan Note (Signed)
Area in question looks like a keratosis but since it is now itching and scaling will refer to derm for complete evaluation.

## 2011-07-13 NOTE — Patient Instructions (Signed)
Follow up in 3 months to recheck sugar and cholesterol (don't eat before this appt) We'll notify you of your lab results Someone will call you with your derm appt Call with any questions or concerns You look great!  Keep up the good work!

## 2011-07-13 NOTE — Assessment & Plan Note (Signed)
Chronic problem.  Typically well controlled.  Asymptomatic.  Check labs.  Adjust meds prn  

## 2011-07-13 NOTE — Progress Notes (Signed)
  Subjective:    Patient ID: [name], female    DOB: 01/20/1941, 71 y.o.   MRN: [id number]  HPI DM- chronic problem.  On Actoplus Met.  Sugars well controlled.  Denies symptomatic low- shakiness, dizziness.  UTD on eye exam.  No CP, SOB, HAs, visual changes.  Has never been on ACE  Elevated BP- new.  Does not carry dx of HTN.  Needs tight BP control due to DM.  Skin lesion- R shoulder.  Reports area is itching.  Would like to see Derm.  Has long hx of sun exposure.   Review of Systems For ROS see HPI     Objective:   Physical Exam  Vitals reviewed. Constitutional: She is oriented to person, place, and time. She appears well-developed and well-nourished. No distress.  HENT:  Head: Normocephalic and atraumatic.  Eyes: Conjunctivae and EOM are normal. Pupils are equal, round, and reactive to light.  Neck: Normal range of motion. Neck supple. No thyromegaly present.  Cardiovascular: Normal rate, regular rhythm, normal heart sounds and intact distal pulses.   No murmur heard. Pulmonary/Chest: Effort normal and breath sounds normal. No respiratory distress.  Abdominal: Soft. She exhibits no distension. There is no tenderness.  Musculoskeletal: She exhibits no edema.  Lymphadenopathy:    She has no cervical adenopathy.  Neurological: She is alert and oriented to person, place, and time.  Skin: Skin is warm and dry.       1 cm erythematous, scaly lesion on R shoulder  Psychiatric: She has a normal mood and affect. Her behavior is normal.          Assessment & Plan:

## 2011-07-14 ENCOUNTER — Encounter: Payer: Self-pay | Admitting: *Deleted

## 2011-07-17 ENCOUNTER — Telehealth: Payer: Self-pay | Admitting: *Deleted

## 2011-07-17 NOTE — Telephone Encounter (Signed)
Wanted to add the last bone density scan

## 2011-07-31 DIAGNOSIS — D235 Other benign neoplasm of skin of trunk: Secondary | ICD-10-CM | POA: Diagnosis not present

## 2011-07-31 DIAGNOSIS — L821 Other seborrheic keratosis: Secondary | ICD-10-CM | POA: Diagnosis not present

## 2011-08-07 ENCOUNTER — Telehealth: Payer: Self-pay | Admitting: Family Medicine

## 2011-08-07 NOTE — Telephone Encounter (Signed)
Refill- piroxicam(feldene) 20mg  capsule. Take one capsule each day. Qty 30 last fill 1.16.13

## 2011-08-10 MED ORDER — PIROXICAM 20 MG PO CAPS: 20.0000 mg | ORAL_CAPSULE | Freq: Every day | ORAL | Status: DC

## 2011-08-10 NOTE — Telephone Encounter (Signed)
rx sent to pharmacy by e-script  

## 2011-08-23 ENCOUNTER — Ambulatory Visit (HOSPITAL_BASED_OUTPATIENT_CLINIC_OR_DEPARTMENT_OTHER)
Admission: RE | Admit: 2011-08-23 | Discharge: 2011-08-23 | Disposition: A | Discharge status: Home / Self Care | Payer: Medicare Other | Source: Ambulatory Visit | Attending: Family | Admitting: Family

## 2011-08-23 ENCOUNTER — Encounter: Payer: Self-pay | Admitting: Family

## 2011-08-23 ENCOUNTER — Telehealth: Payer: Self-pay | Admitting: *Deleted

## 2011-08-23 ENCOUNTER — Ambulatory Visit (INDEPENDENT_AMBULATORY_CARE_PROVIDER_SITE_OTHER): Payer: Medicare Other | Admitting: Family

## 2011-08-23 DIAGNOSIS — N200 Calculus of kidney: Secondary | ICD-10-CM | POA: Diagnosis not present

## 2011-08-23 DIAGNOSIS — M545 Low back pain, unspecified: Secondary | ICD-10-CM | POA: Diagnosis not present

## 2011-08-23 DIAGNOSIS — R319 Hematuria, unspecified: Secondary | ICD-10-CM | POA: Insufficient documentation

## 2011-08-23 DIAGNOSIS — K7689 Other specified diseases of liver: Secondary | ICD-10-CM | POA: Insufficient documentation

## 2011-08-23 DIAGNOSIS — R911 Solitary pulmonary nodule: Secondary | ICD-10-CM | POA: Insufficient documentation

## 2011-08-23 DIAGNOSIS — R109 Unspecified abdominal pain: Secondary | ICD-10-CM

## 2011-08-23 DIAGNOSIS — R1032 Left lower quadrant pain: Secondary | ICD-10-CM | POA: Insufficient documentation

## 2011-08-23 DIAGNOSIS — R1031 Right lower quadrant pain: Secondary | ICD-10-CM | POA: Diagnosis not present

## 2011-08-23 LAB — POCT URINALYSIS DIPSTICK
Ketones, UA: NEGATIVE
Protein, UA: 100
Spec Grav, UA: >=1.03
pH, UA: 6

## 2011-08-23 MED ORDER — CIPROFLOXACIN HCL 500 MG PO TABS: 500.0000 mg | ORAL_TABLET | Freq: Two times a day (BID) | ORAL | Status: DC

## 2011-08-23 NOTE — Assessment & Plan Note (Signed)
Will obtain CT abd/pelvis to rule out stone.  Start empiric cipro, send urine for culture.

## 2011-08-23 NOTE — Telephone Encounter (Signed)
[facility] Triage Call Report Triage Record Num: [id number] Operator: Di Kindle Patient Name: [name] Call Date & Time: 08/23/2011 9:30:25AM Patient Phone: [phone] PCP: Lezlie Octave Patient Gender: Female PCP Fax : [phone] Patient DOB: 1941-05-27 Practice Name: [facility] Reason for Call: Caller: Naylani/Patient; PCP: Sheliah Hatch.; CB#: [phone]; Call regarding Urinary Sympoms, with back pain, onset 08/21/11 of urine darker in color. Symptoms reviewed Bloody Urine, afebrile, with back pain, caller to office for appt within 4hrs. Protocol(s) Used: Bloody Urine Protocol(s) Used: Urinary Symptoms - Female Recommended Outcome per Protocol: See Provider within 4 hours Reason for Outcome: Blood in urine Urinary tract symptoms AND any flank, low back, lower abdominal or genital area (labia, vagina OR testicle/scrotum) pain Care Advice: ~ Another adult should drive. Most adults need to drink 6-10 eight-ounce glasses (1.2-2.0 liters) of fluids per day unless previously told to limit fluid intake for other medical reasons. Limit fluids that contain caffeine, sugar or alcohol. Urine will be a very light yellow color when you drink enough fluids. ~ Systemic Inflammatory Response Syndrome (SIRS): Watch for signs of a generalized, whole body infection. Occurs within days of a localized infection, especially of the urinary, GI, respiratory or nervous systems; or after a traumatic injury or invasive procedure. - Call EMS 911 if symptoms have worsened, such as increasing confusion or unusual drowsiness; cold and clammy skin; no urine output; rapid respiration (>30/min.) or slow respiration (<10/min.); struggling to breathe. - Go to the ED immediately for early symptoms of rapid pulse >90/min. or rapid breathing >20/min. at rest; chills; oral temperature >100.4 F (38 C) or <96.8 F (36 C) when associated with conditions noted.

## 2011-08-23 NOTE — Progress Notes (Signed)
Subjective:    Patient ID: [name], female    DOB: 14-Oct-1940, 71 y.o.   MRN: [id number]  HPI  [name] is a 71 yr old female who presents today with complaint of suprapubic tenderness, bilateral low back pain,  Gross hematuria.  Symptoms started 2-3 days ago.  Denies fever.  Reports hx of UTI's but denies hx of gross hematuria, or kidney stones.     Review of Systems See HPI   Past Medical History  Diagnosis Date  . Allergic rhinitis   . Diabetes mellitus   . Urinary tract infection   . Migraines   . Palpitations     History   Social History  . Marital Status: Married    Spouse Name: N/A    Number of Children: N/A  . Years of Education: N/A   Occupational History  . Not on file.   Social History Main Topics  . Smoking status: Former Games developer  . Smokeless tobacco: Not on file   Comment: smoked in college years ago  . Alcohol Use: Yes     wine 4 nights per week  . Drug Use: No  . Sexually Active:    Other Topics Concern  . Not on file   Social History Narrative  . No narrative on file    Past Surgical History  Procedure Date  . Shoulder surgery   . Tonsillectomy   . Abdominal hysterectomy   . Deviated septum repair     Family History  Problem Relation Age of Onset  . Diabetes Father     Allergies  Allergen Reactions  . Papaya Derivatives Anaphylaxis  . Mushroom Ext Cmplx(Shiitake-Reishi-Mait) Other (See Comments)    Head congestion and sore throat  . Venlafaxine Other (See Comments)    Pt states she can take Name brand. Lethargy    Current Outpatient Prescriptions on File Prior to Visit  Medication Sig Dispense Refill  . b complex vitamins capsule Take 1 capsule by mouth daily.        . bimatoprost (LUMIGAN) 0.03 % ophthalmic solution 1 drop at bedtime.        . fexofenadine-pseudoephedrine (ALLEGRA-D 24) 180-240 MG per 24 hr tablet Take 1 tablet by mouth daily.        . Fluticasone-Salmeterol (ADVAIR) 250-50 MCG/DOSE AEPB Inhale 1 puff  into the lungs every 12 (twelve) hours.        . milk thistle 175 MG tablet Take 175 mg by mouth daily.        . mometasone (NASONEX) 50 MCG/ACT nasal spray 2 sprays by Nasal route daily as needed.        . pioglitazone-metformin (ACTOPLUS MET) 15-500 MG per tablet Take 1 tablet by mouth 2 (two) times daily with a meal.        . pioglitazone-metformin (ACTOPLUS MET) 15-850 MG per tablet TAKE ONE (1) TABLET EACH DAY  60 tablet  3  . piroxicam (FELDENE) 20 MG capsule Take 1 capsule (20 mg total) by mouth daily.  30 capsule  3  . TRICOR 145 MG tablet TAKE ONE (1) TABLET EACH DAY (PATIENT   NEEDS APPOINTMENT FOR MORE REFILLS)  30 tablet  3  . VITAMIN D, CHOLECALCIFEROL, PO Take by mouth daily.        Marland Kitchen venlafaxine (EFFEXOR) 75 MG tablet TAKE ONE (1) TABLET EACH DAY  30 tablet  0    BP 120/76  Pulse 81  Temp(Src) 97.8 F (36.6 C) (Oral)  Resp 16  Wt 193 lb 1.3 oz (87.581 kg)  SpO2 97%       Objective:   Physical Exam  Constitutional: She appears well-developed and well-nourished. No distress.  Cardiovascular: Normal rate and regular rhythm.   No murmur heard. Pulmonary/Chest: Effort normal and breath sounds normal. No respiratory distress. She has no wheezes. She has no rales. She exhibits no tenderness.  Genitourinary:       Mild bilateral CVAT R>L  Musculoskeletal: She exhibits no edema.          Assessment & Plan:

## 2011-08-23 NOTE — Telephone Encounter (Signed)
noted 

## 2011-08-23 NOTE — Telephone Encounter (Signed)
Pt has appt today with Melissa.

## 2011-08-23 NOTE — Patient Instructions (Signed)
Please start cipro. Complete your CT this afternoon on the first floor.   Call if you develop fever >101, chills, if symptoms worsen, or if you are not feeling better in 2-3 days. Follow up with Dr. Beverely Low in 1 week.

## 2011-08-25 ENCOUNTER — Telehealth: Payer: Self-pay | Admitting: Family

## 2011-08-25 DIAGNOSIS — R911 Solitary pulmonary nodule: Secondary | ICD-10-CM

## 2011-08-25 DIAGNOSIS — K760 Fatty (change of) liver, not elsewhere classified: Secondary | ICD-10-CM | POA: Insufficient documentation

## 2011-08-25 DIAGNOSIS — N200 Calculus of kidney: Secondary | ICD-10-CM | POA: Insufficient documentation

## 2011-08-25 NOTE — Telephone Encounter (Signed)
Spoke with pt. Reviewed neg urine culture- she reports feeling better and blood in urine has "cleared up."  Instructed pt to d/c cipro.  Discussed non-obstructive stone L kidney and incidental finding of hepatic steatosis. We discussed low fat/low cholesterol diet/exercise and weight loss.  Also discussed incidental finding of RLL 4mm nodule. She has former smoking hx, will need follow up CT in 1 yr. Pt is aware.

## 2011-08-28 ENCOUNTER — Telehealth: Payer: Self-pay | Admitting: Family Medicine

## 2011-08-28 NOTE — Telephone Encounter (Signed)
Refill: Fenofibrate 145 mg tab. Take 1 tablet each day-Patient needs appt for more refills. Qty 30. Last fill 07-31-11   

## 2011-08-28 NOTE — Telephone Encounter (Signed)
Refill: Fenofibrate 145 mg tab. Take 1 tablet each day-Patient needs appt for more refills. Qty 30. Last fill 07-31-11

## 2011-08-29 MED ORDER — FENOFIBRATE 145 MG PO TABS: 145.0000 mg | ORAL_TABLET | Freq: Every day | ORAL | Status: DC

## 2011-08-29 NOTE — Telephone Encounter (Signed)
rx sent to pharmacy by e-script  

## 2011-08-31 ENCOUNTER — Encounter: Payer: Self-pay | Admitting: Family Medicine

## 2011-08-31 ENCOUNTER — Ambulatory Visit (INDEPENDENT_AMBULATORY_CARE_PROVIDER_SITE_OTHER): Payer: Medicare Other | Admitting: Family Medicine

## 2011-08-31 VITALS — BP 121/75 | HR 92 | Temp 98.4°F | Ht 63.25 in | Wt 195.0 lb

## 2011-08-31 DIAGNOSIS — R1031 Right lower quadrant pain: Secondary | ICD-10-CM | POA: Diagnosis not present

## 2011-08-31 DIAGNOSIS — R319 Hematuria, unspecified: Secondary | ICD-10-CM

## 2011-08-31 DIAGNOSIS — K2970 Gastritis, unspecified, without bleeding: Secondary | ICD-10-CM | POA: Diagnosis not present

## 2011-08-31 DIAGNOSIS — K2990 Gastroduodenitis, unspecified, without bleeding: Secondary | ICD-10-CM | POA: Diagnosis not present

## 2011-08-31 LAB — CBC WITH DIFFERENTIAL/PLATELET
Basophils Absolute: 0 10*3/uL (ref 0.0–0.1)
Hemoglobin: 12.1 g/dL (ref 12.0–15.0)
Lymphocytes Relative: 26.2 % (ref 12.0–46.0)
Monocytes Relative: 5.9 % (ref 3.0–12.0)
Neutro Abs: 3.8 10*3/uL (ref 1.4–7.7)
RBC: 4.14 Mil/uL (ref 3.87–5.11)
RDW: 14.2 % (ref 11.5–14.6)

## 2011-08-31 LAB — BASIC METABOLIC PANEL
CO2: 28 mEq/L (ref 19–32)
Chloride: 103 mEq/L (ref 96–112)
Creatinine, Ser: 1.1 mg/dL (ref 0.4–1.2)

## 2011-08-31 LAB — POCT URINALYSIS DIPSTICK
Bilirubin, UA: NEGATIVE
Glucose, UA: NEGATIVE
Ketones, UA: NEGATIVE

## 2011-08-31 LAB — CK: Total CK: 34 U/L (ref 7–177)

## 2011-08-31 LAB — HEPATIC FUNCTION PANEL: Albumin: 4.5 g/dL (ref 3.5–5.2)

## 2011-08-31 MED ORDER — CIPROFLOXACIN HCL 500 MG PO TABS: 500.0000 mg | ORAL_TABLET | Freq: Two times a day (BID) | ORAL | Status: AC

## 2011-08-31 MED ORDER — HYDROCODONE-ACETAMINOPHEN 5-500 MG PO TABS: 1.0000 | ORAL_TABLET | Freq: Four times a day (QID) | ORAL | Status: AC | PRN

## 2011-08-31 NOTE — Progress Notes (Signed)
  Subjective:    Patient ID: [name], female    DOB: [birth year], 71 y.o.   MRN: [id number]  HPI Hematuria- saw Melissa for painful hematuria.  'i was doubled over'.  At the time had dark colored urine.  Started on Cipro for UTI but cx was negative.  Still having discomfort.  No dysuria.  + LBP.  Has L 9mm non-obstructing stone.  Pt has hx of intermittent RLQ pain.  Pain will radiate into low back.  GERD- reports central abd pain, last week had episode of regurg.  Certain foods are causing severe abdominal/epigastric pain, 'burning' .  Hx of PUD.   Review of Systems For ROS see HPI     Objective:   Physical Exam  Vitals reviewed. Constitutional: She appears well-developed and well-nourished. No distress.  Cardiovascular: Normal rate, regular rhythm and normal heart sounds.   No murmur heard. Pulmonary/Chest: Effort normal and breath sounds normal. No respiratory distress. She has no wheezes. She has no rales.  Abdominal: Soft. Bowel sounds are normal. She exhibits no distension. There is tenderness (mild suprapubic tenderness on R, no CVA tenderness). There is no rebound and no guarding.          Assessment & Plan:

## 2011-08-31 NOTE — Patient Instructions (Signed)
Start the Nexium daily for the acid reflux Drink plenty of fluids We'll notify you of your lab results and determine the next step Someone will call you with your urology and GI appts Call with any questions or concerns Hang in there!!!

## 2011-09-01 ENCOUNTER — Ambulatory Visit: Payer: Medicare Other | Admitting: Family Medicine

## 2011-09-02 LAB — CULTURE, URINE COMPREHENSIVE
Colony Count: NO GROWTH
Organism ID, Bacteria: NO GROWTH

## 2011-09-05 ENCOUNTER — Encounter: Payer: Self-pay | Admitting: Gastroenterology

## 2011-09-08 DIAGNOSIS — H4011X0 Primary open-angle glaucoma, stage unspecified: Secondary | ICD-10-CM | POA: Diagnosis not present

## 2011-09-08 DIAGNOSIS — H409 Unspecified glaucoma: Secondary | ICD-10-CM | POA: Diagnosis not present

## 2011-09-10 NOTE — Assessment & Plan Note (Signed)
New.  Pt w/ hx of PUD.  Check H pylori.  Start Nexium.  Refer to GI.  Reviewed supportive care and red flags that should prompt return.  Pt expressed understanding and is in agreement w/ plan.

## 2011-09-10 NOTE — Assessment & Plan Note (Signed)
New.  Pt w/ hx of similar.  Recent CT showed no abnormality in this location.  Recheck CBC to r/o infxn (appendicitis).  Refer to GI for complete w/u.  Reviewed supportive care and red flags that should prompt return.  Pt expressed understanding and is in agreement w/ plan.

## 2011-09-10 NOTE — Assessment & Plan Note (Signed)
Persistent.  Given reported 'tea colored' urine have concerns for possible liver dysfxn or myoglobinuria.  Check CK, LFTs, refer to uro.  Restart Cipro for possible infxn.

## 2011-09-12 ENCOUNTER — Telehealth: Payer: Self-pay | Admitting: Family Medicine

## 2011-09-12 NOTE — Telephone Encounter (Signed)
Patient called stating she is having sinus/flu symptoms including productive cough with discolored mucous. She states she has lung problems and the last 3 times she has had these symptoms she has ended up in the hospital with pneumonia. Patient did not want to speak with a triage nurse. She requested to speak with Dr. Rennis Golden assistant because she is familiar with her situation. Patient would like advice on what she should do. I advised pt that she may need to come into the office and she stated she would, if that's what Dr. Rennis Golden asst wants her to do.

## 2011-09-12 NOTE — Telephone Encounter (Signed)
FYI,.left message to have patient return my call.

## 2011-09-12 NOTE — Telephone Encounter (Signed)
Agree w appt

## 2011-09-12 NOTE — Telephone Encounter (Signed)
Called pt to clarify sxs, pt husband advised that the pt has lost her voice and that both of them note same sxs for the most part, husband states that she has yellow and brownish colored mucus, soreness noted behind her nose/throat, ears are stopped up, denies skin hurting/vomitting/fever/diarhea, mostly a lot of mucus and drainage, wondered if anything could be called in for her, advised that she will need to be seen, pt stated she would rather wait to see MD Beverely Low tomorrow instead of seeing another MD in office, pt accepted apt for 09-13-11 at 11:30am, pt husband understood apt time and accepted

## 2011-09-13 ENCOUNTER — Ambulatory Visit (INDEPENDENT_AMBULATORY_CARE_PROVIDER_SITE_OTHER): Payer: Medicare Other | Admitting: Family Medicine

## 2011-09-13 ENCOUNTER — Encounter: Payer: Self-pay | Admitting: Family Medicine

## 2011-09-13 VITALS — BP 122/79 | HR 84 | Temp 98.4°F | Ht 63.25 in | Wt 193.0 lb

## 2011-09-13 DIAGNOSIS — J019 Acute sinusitis, unspecified: Secondary | ICD-10-CM

## 2011-09-13 MED ORDER — AMOXICILLIN 875 MG PO TABS: 875.0000 mg | ORAL_TABLET | Freq: Two times a day (BID) | ORAL | Status: DC

## 2011-09-13 NOTE — Progress Notes (Signed)
  Subjective:    Patient ID: [name], female    DOB: 24-Dec-1940, 71 y.o.   MRN: [id number]  HPI ? Sinusitis- husband was recently sick.  Hx of PNA.  + congestion, facial pain, sore throat, hoarseness.  sxs started on Sunday.  No fevers.  + cough- mild but productive of 'brownish yellow stuff'.   Review of Systems For ROS see HPI     Objective:   Physical Exam  Vitals reviewed. Constitutional: She appears well-developed and well-nourished. No distress.  HENT:  Head: Normocephalic and atraumatic.  Right Ear: Tympanic membrane normal.  Left Ear: Tympanic membrane normal.  Nose: Mucosal edema and rhinorrhea present. Right sinus exhibits maxillary sinus tenderness and frontal sinus tenderness. Left sinus exhibits maxillary sinus tenderness and frontal sinus tenderness.  Mouth/Throat: Uvula is midline and mucous membranes are normal. Posterior oropharyngeal erythema present. No oropharyngeal exudate.  Eyes: Conjunctivae and EOM are normal. Pupils are equal, round, and reactive to light.  Neck: Normal range of motion. Neck supple.  Cardiovascular: Normal rate, regular rhythm and normal heart sounds.   Pulmonary/Chest: Effort normal and breath sounds normal. No respiratory distress. She has no wheezes.  Lymphadenopathy:    She has no cervical adenopathy.          Assessment & Plan:

## 2011-09-13 NOTE — Assessment & Plan Note (Signed)
Pt's sxs and PE consistent w/ infxn.  Start abx.  Reviewed supportive care and red flags that should prompt return.  Pt expressed understanding and is in agreement w/ plan.  

## 2011-09-13 NOTE — Patient Instructions (Signed)
This is a sinus infection Start the Amox twice daily- take w/ food Add Mucinex to thin your congestion Delsym as needed for cough Drink plenty of fluids REST! Hang in there!

## 2011-10-02 ENCOUNTER — Ambulatory Visit (INDEPENDENT_AMBULATORY_CARE_PROVIDER_SITE_OTHER): Payer: Medicare Other | Admitting: Gastroenterology

## 2011-10-02 ENCOUNTER — Encounter: Payer: Self-pay | Admitting: Gastroenterology

## 2011-10-02 DIAGNOSIS — R109 Unspecified abdominal pain: Secondary | ICD-10-CM

## 2011-10-02 MED ORDER — ESOMEPRAZOLE MAGNESIUM 40 MG PO CPDR: 40.0000 mg | DELAYED_RELEASE_CAPSULE | Freq: Every day | ORAL | Status: DC

## 2011-10-02 NOTE — Patient Instructions (Addendum)
You need to reschedule your urology appt to workup the blood in your urine.  Blood in urine is not from GI process.  You will be set up for an upper endoscopy. You should change the way you are taking your antiacid medicine (nexium) so that you are taking it 20-30 minutes prior to a decent meal as that is the way the pill is designed to work most effectively.  A new prescription was called in for this. We will get records from [facility] colonoscopy 3-4 years ago.

## 2011-10-02 NOTE — Progress Notes (Signed)
HPI: This is a   very pleasant 71 year old woman whom I am meeting for the first time today.  Had pain in abd, blood in urine.  She was scheduled to see a urologist but was sick, has not seen them.  This was about 1 month ago.  She has intermittent right lower quadrant pain.  Will awake at night to urinate with the pain, full bladder.  Urinating will cause the pain to diminish usually.  She thinks she is developing   She has had pyrosis periodically.  "burning in her esophagus."  She was given samples of nexium about a month. She has been taking about one pill a day.  Seems to have been helping.  Has had GI acute illness, started 4 days ago.  Her husband had same sypmtoms (diarrheal illness, very gassey).  Husbands illness last 3-4 weeks.  She will have loose stools with any po intake.  Taking pepto which has solidified stools.    Colonsocopy in [place] 3-4 years ago.  She tells me that it was normal. We do not have the reports available at the time of this visit however.  IMPRESSION from last month Non-IV contrast CT scan:  1. 9 mm nonobstructive calculus in the left renal collecting system. 2. Mild hepatic steatosis. 3. 4 mm pulmonary nodule in the right lower lobe. If the patient is at high risk for bronchogenic carcinoma, follow-up chest CT at 1 year is recommended. If the patient is at low risk, no follow-up is needed. This recommendation follows the consensus statement: Guidelines for Management of Small Pulmonary Nodules Detected on CT Scans: A Statement from the Fleischner Society as published in Radiology 2005; 237:395-400. 4. Status post hysterectomy.   Recent bloodwork last month: Normal CBC, normal CMET, + blood in urine, H pylori Ab negative   Review of systems: Pertinent positive and negative review of systems were noted in the above HPI section. Complete review of systems was performed and was otherwise normal.    Past Medical History  Diagnosis Date  . Allergic rhinitis    . Diabetes mellitus   . Urinary tract infection   . Migraines   . Palpitations   . Asthma   . Hyperlipemia     Past Surgical History  Procedure Date  . Shoulder surgery   . Tonsillectomy   . Abdominal hysterectomy   . Deviated septum repair     Current Outpatient Prescriptions  Medication Sig Dispense Refill  . b complex vitamins capsule Take 1 capsule by mouth daily.        . bimatoprost (LUMIGAN) 0.03 % ophthalmic solution 1 drop at bedtime.        . brimonidine (ALPHAGAN P) 0.1 % SOLN as directed.      Marland Kitchen esomeprazole (NEXIUM) 40 MG capsule Take 40 mg by mouth daily before breakfast.      . fenofibrate (TRICOR) 145 MG tablet Take 1 tablet (145 mg total) by mouth daily.  30 tablet  3  . fexofenadine-pseudoephedrine (ALLEGRA-D 24) 180-240 MG per 24 hr tablet Take 1 tablet by mouth daily.        . Fluticasone-Salmeterol (ADVAIR) 250-50 MCG/DOSE AEPB Inhale 1 puff into the lungs every 12 (twelve) hours.        . mometasone (NASONEX) 50 MCG/ACT nasal spray Place 2 sprays into the nose daily as needed.        . pioglitazone-metformin (ACTOPLUS MET) 15-500 MG per tablet Take 1 tablet by mouth 2 (two) times daily with  a meal.        . piroxicam (FELDENE) 20 MG capsule Take 1 capsule (20 mg total) by mouth daily.  30 capsule  3  . venlafaxine (EFFEXOR) 75 MG tablet TAKE ONE (1) TABLET EACH DAY  30 tablet  0  . VITAMIN D, CHOLECALCIFEROL, PO Take 1,000 Units by mouth daily.       . milk thistle 175 MG tablet Take 175 mg by mouth daily.        Marland Kitchen DISCONTD: pioglitazone-metformin (ACTOPLUS MET) 15-850 MG per tablet TAKE ONE (1) TABLET EACH DAY  60 tablet  3    Allergies as of 10/02/2011 - Review Complete 10/02/2011  Allergen Reaction Noted  . Papaya derivatives Anaphylaxis 08/23/2011  . Mushroom ext cmplx(shiitake-reishi-mait) Other (See Comments) 08/23/2011  . Venlafaxine Other (See Comments) 08/23/2011    Family History  Problem Relation Age of Onset  . Diabetes Father   . Colon  cancer Neg Hx     History   Social History  . Marital Status: Married    Spouse Name: N/A    Number of Children: 2  . Years of Education: N/A   Occupational History  . Retired -Research officer, political party    Social History Main Topics  . Smoking status: Former Games developer  . Smokeless tobacco: Never Used   Comment: smoked in college years ago  . Alcohol Use: Yes     1-2 glasses a night   . Drug Use: No  . Sexually Active: Not on file   Other Topics Concern  . Not on file   Social History Narrative   Daily caffeine        Physical Exam: BP 134/68  Pulse 88  Ht 5\' 3"  (1.6 m)  Wt 191 lb (86.637 kg)  BMI 33.83 kg/m2 Constitutional: generally well-appearing Psychiatric: alert and oriented x3 Eyes: extraocular movements intact Mouth: oral pharynx moist, no lesions Neck: supple no lymphadenopathy Cardiovascular: heart regular rate and rhythm Lungs: clear to auscultation bilaterally Abdomen: soft, nontender, nondistended, no obvious ascites, no peritoneal signs, normal bowel sounds Extremities: no lower extremity edema bilaterally Skin: no lesions on visible extremities    Assessment and plan: 71 y.o. female with  pyrosis, intermittent right lower quadrant pains, blood in urine  First, the blood in her urine cannot be explained by a gastrointestinal cause and I recommend that she reschedule her urology appointment. Today she is describing mainly upper GI symptoms with pyrosis, epigastric discomfort. I think we should proceed with EGD at [facility] his convenience and I have called in a new prescription for Nexium. She was not taking it every day since she received the samples. I reassured that she should be taking one pill once daily shortly before breakfast.

## 2011-10-03 ENCOUNTER — Telehealth: Payer: Self-pay | Admitting: Gastroenterology

## 2011-10-03 ENCOUNTER — Telehealth: Payer: Self-pay | Admitting: *Deleted

## 2011-10-03 NOTE — Telephone Encounter (Signed)
Stop Feldene & monitor symptoms

## 2011-10-03 NOTE — Telephone Encounter (Signed)
Left message to call office

## 2011-10-03 NOTE — Telephone Encounter (Signed)
Pt left VM stating that she thinks that the [facility] 20 MG may be interacting with her actoplus and causing her stomach issue. Pt notes that she does not feel like she needs the feldene because she does not have arthritis.Marland KitchenPlease advise

## 2011-10-03 NOTE — Telephone Encounter (Signed)
I spoke with the pt and she provided the information from her last colon dated 09/12/2005 Dr Murrell Redden [phone]  I will fax the records release form today

## 2011-10-04 ENCOUNTER — Telehealth: Payer: Self-pay | Admitting: Family Medicine

## 2011-10-04 NOTE — Telephone Encounter (Signed)
Needs to call GI and discuss this w/ them since she had appt on Monday

## 2011-10-04 NOTE — Telephone Encounter (Signed)
Caller: [name]/Patient; PCP: Sheliah Hatch.; CB#: [phone]; ; ; Call regarding Diarrhea; onset > 1 week ago.  States her entire GI system hurts after eating.  Has endoscopy scheduled 10/06/11 and is concerned that her diarrhea will continue while she is under anesthesia.   Taking fluids and bland diet well.  Per protocol, emergent symptoms denied; advised per home care guidelines, with callback parameters given.

## 2011-10-04 NOTE — Telephone Encounter (Signed)
Called pt to advise, and she stated that she started imodium and drinking plenty of fluids and she feels much better, I advised to stay away from eggs, milk and diary products, pt understood.MD Beverely Low made aware verbally

## 2011-10-04 NOTE — Telephone Encounter (Signed)
Discuss with patient  

## 2011-10-06 ENCOUNTER — Ambulatory Visit (AMBULATORY_SURGERY_CENTER): Payer: Medicare Other | Admitting: Gastroenterology

## 2011-10-06 ENCOUNTER — Encounter: Payer: Self-pay | Admitting: Gastroenterology

## 2011-10-06 VITALS — BP 162/82 | HR 84 | Temp 98.4°F | Resp 18 | Ht 63.0 in | Wt 191.0 lb

## 2011-10-06 DIAGNOSIS — K319 Disease of stomach and duodenum, unspecified: Secondary | ICD-10-CM | POA: Diagnosis not present

## 2011-10-06 DIAGNOSIS — K2990 Gastroduodenitis, unspecified, without bleeding: Secondary | ICD-10-CM | POA: Diagnosis not present

## 2011-10-06 DIAGNOSIS — K2970 Gastritis, unspecified, without bleeding: Secondary | ICD-10-CM

## 2011-10-06 DIAGNOSIS — R109 Unspecified abdominal pain: Secondary | ICD-10-CM | POA: Diagnosis not present

## 2011-10-06 LAB — GLUCOSE, CAPILLARY
Glucose-Capillary: 105 mg/dL — ABNORMAL HIGH (ref 70–99)
Glucose-Capillary: 103 mg/dL — ABNORMAL HIGH (ref 70–99)

## 2011-10-06 MED ORDER — SODIUM CHLORIDE 0.9 % IV SOLN: 500.0000 mL | INTRAVENOUS | Status: DC

## 2011-10-06 MED ORDER — METOCLOPRAMIDE HCL 5 MG PO TABS: 5.0000 mg | ORAL_TABLET | Freq: Three times a day (TID) | ORAL | Status: DC

## 2011-10-06 NOTE — Op Note (Signed)
[facility] [address] [place], [place]  [address]  ENDOSCOPY PROCEDURE REPORT  PATIENT:  Karen, Griffin  MR#:  [id number] BIRTHDATE:  01-28-1941, 70 yrs. old  GENDER:  female ENDOSCOPIST:  Rachael Fee, MD Referred by:  Neena Rhymes, M.D. PROCEDURE DATE:  10/06/2011 PROCEDURE:  EGD with biopsy, 43239 ASA CLASS:  Class II INDICATIONS:  pyrosis, nausea, dyspepsia MEDICATIONS:   Fentanyl 50 mcg IV, These medications were titrated to patient response per physician's verbal order, Versed 5 mg IV TOPICAL ANESTHETIC:  Cetacaine Spray  DESCRIPTION OF PROCEDURE:   After the risks benefits and alternatives of the procedure were thoroughly explained, informed consent was obtained.  The LB [id number] endoscope was introduced through the mouth and advanced to the second portion of the duodenum, without limitations.  The instrument was slowly withdrawn as the mucosa was fully examined. <<PROCEDUREIMAGES>> There was moderate, non-specific gastritis. Biopsies taken and sent to pathology (jar 1) (see image1 and image2).  Otherwise the examination was normal (see image3, image5, image6, and image8). Retroflexed views revealed no abnormalities.    The scope was then withdrawn from the patient and the procedure completed. COMPLICATIONS:  None ENDOSCOPIC IMPRESSION: 1) Moderate gastritis,  biopsied to check for H. pylori 2) Otherwise normal examination  RECOMMENDATIONS: Continue nexium once daily.  Will call in Reglan 5mg  pills, take one pill with meals for the next 7-10 days. If biopsies show H. pylori, you will be started on appropriate antibiotics.  ______________________________ Rachael Fee, MD  n. eSIGNED:   Rachael Fee at 10/06/2011 03:48 PM  Julieanne Cotton, [id number]

## 2011-10-06 NOTE — Patient Instructions (Signed)
Your blood sugar was 103 in the recovery room.  You may resume your prior medications today.  A handout was given on gastritis to your care partner.  Per Dr. Christella Hartigan continue once daily NEXIUM and REGLAN.  Please call if any questions or concerns.    YOU HAD AN ENDOSCOPIC PROCEDURE TODAY AT THE [facility]: Refer to the procedure report that was given to you for any specific questions about what was found during the examination.  If the procedure report does not answer your questions, please call your gastroenterologist to clarify.  If you requested that your care partner not be given the details of your procedure findings, then the procedure report has been included in a sealed envelope for you to review at your convenience later.  YOU SHOULD EXPECT: Some feelings of bloating in the abdomen. Passage of more gas than usual.  Walking can help get rid of the air that was put into your GI tract during the procedure and reduce the bloating. If you had a lower endoscopy (such as a colonoscopy or flexible sigmoidoscopy) you may notice spotting of blood in your stool or on the toilet paper. If you underwent a bowel prep for your procedure, then you may not have a normal bowel movement for a few days.  DIET: Your first meal following the procedure should be a light meal and then it is ok to progress to your normal diet.  A half-sandwich or bowl of soup is an example of a good first meal.  Heavy or fried foods are harder to digest and may make you feel nauseous or bloated.  Likewise meals heavy in dairy and vegetables can cause extra gas to form and this can also increase the bloating.  Drink plenty of fluids but you should avoid alcoholic beverages for 24 hours.  ACTIVITY: Your care partner should take you home directly after the procedure.  You should plan to take it easy, moving slowly for the rest of the day.  You can resume normal activity the day after the procedure however you should NOT DRIVE or  use heavy machinery for 24 hours (because of the sedation medicines used during the test).    SYMPTOMS TO REPORT IMMEDIATELY: A gastroenterologist can be reached at any hour.  During normal business hours, 8:30 AM to 5:00 PM Monday through Friday, call [phone].  After hours and on weekends, please call the GI answering service at [phone] who will take a message and have the physician on call contact you.     Following upper endoscopy (EGD)  Vomiting of blood or coffee ground material  New chest pain or pain under the shoulder blades  Painful or persistently difficult swallowing  New shortness of breath  Fever of 100F or higher  Black, tarry-looking stools  FOLLOW UP: If any biopsies were taken you will be contacted by phone or by letter within the next 1-3 weeks.  Call your gastroenterologist if you have not heard about the biopsies in 3 weeks.  Our staff will call the home number listed on your records the next business day following your procedure to check on you and address any questions or concerns that you may have at that time regarding the information given to you following your procedure. This is a courtesy call and so if there is no answer at the home number and we have not heard from you through the emergency physician on call, we will assume that you have returned to  your regular daily activities without incident.  SIGNATURES/CONFIDENTIALITY: You and/or your care partner have signed paperwork which will be entered into your electronic medical record.  These signatures attest to the fact that that the information above on your After Visit Summary has been reviewed and is understood.  Full responsibility of the confidentiality of this discharge information lies with you and/or your care-partner.

## 2011-10-06 NOTE — Progress Notes (Signed)
No complaints noted in the recovery room. Maw  Patient did not experience any of the following events: a burn prior to discharge; a fall within the facility; wrong site/side/patient/procedure/implant event; or a hospital transfer or hospital admission upon discharge from the facility. (G8907) Patient did not have preoperative order for IV antibiotic SSI prophylaxis. (G8918)  

## 2011-10-09 ENCOUNTER — Telehealth: Payer: Self-pay | Admitting: Gastroenterology

## 2011-10-09 ENCOUNTER — Telehealth: Payer: Self-pay | Admitting: *Deleted

## 2011-10-09 DIAGNOSIS — R197 Diarrhea, unspecified: Secondary | ICD-10-CM

## 2011-10-09 NOTE — Telephone Encounter (Signed)
  Follow up Call-  Call back number 10/06/2011  Post procedure Call Back phone  # [phone]  Permission to leave phone message Yes     Patient questions:  Do you have a fever, pain , or abdominal swelling? no Pain Score  0 *  Have you tolerated food without any problems? yes  Have you been able to return to your normal activities? yes  Do you have any questions about your discharge instructions: Diet   no Medications  no Follow up visit  no  Do you have questions or concerns about your Care? yes Patient states she is still having diarrhea,does feel her stomach is better with taking new medications. Patient wants biopsy result, notified her I do not have this at this time but MD should have the results this week. Told patient if she is concern about symptoms she should call Dr.Jacobs nurse. She understands and said she will call "patty" if she feels like she needs to.   Actions: * If pain score is 4 or above: No action needed, pain <4.

## 2011-10-09 NOTE — Telephone Encounter (Signed)
Pt has diarrhea and is taking reglan and imodium with very little relief,  She also has abd cramping.  Please advise.

## 2011-10-09 NOTE — Telephone Encounter (Signed)
She needs stool tested for C. Difficile by PCR, routine culture, fecal leukocytes. I would like her to cut back on her Reglan to twice daily dosing only

## 2011-10-09 NOTE — Telephone Encounter (Signed)
Pt aware and will be in for stool testing

## 2011-10-10 ENCOUNTER — Other Ambulatory Visit: Payer: Medicare Other

## 2011-10-10 ENCOUNTER — Telehealth: Payer: Self-pay

## 2011-10-10 DIAGNOSIS — R197 Diarrhea, unspecified: Secondary | ICD-10-CM | POA: Diagnosis not present

## 2011-10-10 NOTE — Telephone Encounter (Signed)
The pt walked in today very upset that she has not gotten any answers for her diarrhea.  She went down stairs to the lab and got the kits for her stool testing and she was also upset that she had to complete these test herself.  She states she doesn't understand why Dr Christella Hartigan did not do the stool testing on Monday.  She also is upset that the pathology is not back yet.  She wanted to know why the lab did not work the weekends and get the results back immediately.  I tried to explain to her the way the process works and tell her that we take her health very serious and can not treat her for something until the results are back.  She was encouraged to take imodium as needed until we get some results back.  She had calmed down a lot by the time she left and was going to complete her stool test and wait for her results to be reviewed.

## 2011-10-10 NOTE — Telephone Encounter (Signed)
Ok, thanks.

## 2011-10-11 ENCOUNTER — Telehealth: Payer: Self-pay | Admitting: *Deleted

## 2011-10-11 ENCOUNTER — Telehealth: Payer: Self-pay | Admitting: Gastroenterology

## 2011-10-11 MED ORDER — VENLAFAXINE HCL 75 MG PO TABS: ORAL_TABLET | ORAL | Status: DC

## 2011-10-11 NOTE — Telephone Encounter (Signed)
Pt called and is very upset that she has yet to hear from GI in reference to her results. Pt states that she has left messages and it is taking forever for to get any response. Pt would like to know if there is something that Dr Beverely Low could do to speed this process up so that she can figure out what is going on.Pt indicated that she has been taking Imodium 2 to 3 times a day which is helping with the diarrhea but if she does not take it she end up in the bathroom with diarrhea.. Pt notes that it even hurts her to drink plan water at room temperature.

## 2011-10-11 NOTE — Telephone Encounter (Signed)
Noted pt spoke with GI MD Nurse and agreed to wait for results tomorrow, MD Tabori advised verbally

## 2011-10-11 NOTE — Telephone Encounter (Signed)
At this time I have to defer to GI since they have done the work up.  We will send a message to try and speed her results

## 2011-10-11 NOTE — Telephone Encounter (Signed)
Pt was told that the results have not yet been reviewed and I spoke with her at length about about continuing the imodium for the diarrhea and that she could eat whatever foods she could tolerate.  She did advise me that she was taking an NSAID that she was unaware of and had stopped that about a week ago and the burning in her stomach has gotten better.  She agreed to wait for me to call her tomorrow with any results that are available.

## 2011-10-12 ENCOUNTER — Telehealth: Payer: Self-pay | Admitting: Gastroenterology

## 2011-10-12 ENCOUNTER — Telehealth: Payer: Self-pay | Admitting: *Deleted

## 2011-10-12 ENCOUNTER — Encounter: Payer: Medicare Other | Admitting: Gastroenterology

## 2011-10-12 DIAGNOSIS — R197 Diarrhea, unspecified: Secondary | ICD-10-CM

## 2011-10-12 MED ORDER — EFFEXOR XR 75 MG PO CP24: 75.0000 mg | ORAL_CAPSULE | Freq: Every day | ORAL | Status: DC

## 2011-10-12 NOTE — Telephone Encounter (Signed)
Pt was notified that the stool test have all so far came back negative.  She was told that the recommendation of Dr Christella Hartigan was to have a colonoscopy.  She says she does not want to go through that again.  I did advise her that it was important to have to find out why she is having diarrhea.  She did decide to have it done and will come to the office today for instructions and paperwork.

## 2011-10-12 NOTE — Telephone Encounter (Signed)
i sent a results note already about her biopsy and labs so far.  Good to hear that she is feeling a bit better after stopping nsaid

## 2011-10-12 NOTE — Telephone Encounter (Signed)
Unable to enter previsit area of EPIC to do my updates.  Pt was here for her previsit and was given her instructions and signed her consent form and Pre-Procedure Patient Acknowledgement.

## 2011-10-12 NOTE — Telephone Encounter (Signed)
Per call from PharmacyPt advise them that she is unable to take tablet because she is allgery to them. Pt indicated that she can only take capsule. Verbally advised Dr Beverely Low , [place] to change to 75 mg XR capsules. Rx sent

## 2011-10-13 ENCOUNTER — Telehealth: Payer: Self-pay | Admitting: Gastroenterology

## 2011-10-13 NOTE — Telephone Encounter (Signed)
Pt wanted to let Dr Christella Hartigan know that she had a CT scan done in March.  She wanted to know if it would tell the same as a colon.  I told her that the colon is a different test and she would need to have that done.  Pt agreed

## 2011-10-13 NOTE — Progress Notes (Signed)
This encounter was created in error - please disregard.  This encounter was created in error - please disregard.

## 2011-10-14 LAB — STOOL CULTURE

## 2011-10-16 ENCOUNTER — Ambulatory Visit (AMBULATORY_SURGERY_CENTER): Payer: Medicare Other | Admitting: Gastroenterology

## 2011-10-16 ENCOUNTER — Encounter: Payer: Self-pay | Admitting: Gastroenterology

## 2011-10-16 VITALS — BP 158/76 | HR 91 | Temp 93.6°F | Resp 16 | Ht 63.0 in | Wt 191.0 lb

## 2011-10-16 DIAGNOSIS — R197 Diarrhea, unspecified: Secondary | ICD-10-CM | POA: Diagnosis not present

## 2011-10-16 DIAGNOSIS — K5289 Other specified noninfective gastroenteritis and colitis: Secondary | ICD-10-CM

## 2011-10-16 LAB — GLUCOSE, CAPILLARY: Glucose-Capillary: 119 mg/dL — ABNORMAL HIGH (ref 70–99)

## 2011-10-16 MED ORDER — SODIUM CHLORIDE 0.9 % IV SOLN: 500.0000 mL | INTRAVENOUS | Status: DC

## 2011-10-16 NOTE — Progress Notes (Signed)
Patient did not experience any of the following events: a burn prior to discharge; a fall within the facility; wrong site/side/patient/procedure/implant event; or a hospital transfer or hospital admission upon discharge from the facility. (G8907) Patient did not have preoperative order for IV antibiotic SSI prophylaxis. (G8918)  

## 2011-10-16 NOTE — Op Note (Signed)
[facility] [address] [place], [place]  [address]  COLONOSCOPY PROCEDURE REPORT  PATIENT:  Karen, Griffin  MR#:  [id number] BIRTHDATE:  12-25-1940, 70 yrs. old  GENDER:  female ENDOSCOPIST:  Rachael Fee, MD PROCEDURE DATE:  10/16/2011 PROCEDURE:  Colonoscopy with biopsy ASA CLASS:  Class II INDICATIONS:  diarrhea (lactoferring +), improving this past weekend MEDICATIONS:   Fentanyl 75 mcg IV, These medications were titrated to patient response per physician's verbal order, Versed 8 mg IV  DESCRIPTION OF PROCEDURE:   After the risks benefits and alternatives of the procedure were thoroughly explained, informed consent was obtained.  Digital rectal exam was performed and revealed no rectal masses.   The LB CF-[id number] endoscope was introduced through the anus and advanced to the terminal ileum which was intubated for a short distance, without limitations. The quality of the prep was good..  The instrument was then slowly withdrawn as the colon was fully examined. <<PROCEDUREIMAGES>> FINDINGS:  The terminal ileum appeared normal (see image2).  A normal appearing cecum, ileocecal valve, and appendiceal orifice were identified. The ascending, hepatic flexure, transverse, splenic flexure, descending, sigmoid colon, and rectum appeared unremarkable (see image1 and image3).   Retroflexed views in the rectum revealed no abnormalities.  Random colon biopsies were taken and sent to pathology (jar 1). COMPLICATIONS:  None  ENDOSCOPIC IMPRESSION: 1) Normal terminal ileum 2) Normal colon; randomly biopsied to check for microscopic colitis.  RECOMMENDATIONS: 1) You will receive a letter within 1-2 weeks with the results of your biopsy as well as final recommendations. Please call my office if you have not received a letter after 3 weeks. 2) You should continue to follow colorectal cancer screening guidelines for "routine risk" patients with a repeat  colonoscopy in 10 years. There is no need for FOBT (stool) testing for at least 5 years.  REPEAT EXAM:  10 years  ______________________________ Rachael Fee, MD  cc: Neena Rhymes, MD  n. Rosalie Doctor:   Rachael Fee at 10/16/2011 08:43 AM  Julieanne Cotton, [phone]

## 2011-10-16 NOTE — Patient Instructions (Signed)
Discharge instructions given with verbal understanding. Biopsies taken. Resume previous medications. YOU HAD AN ENDOSCOPIC PROCEDURE TODAY AT THE [facility]: Refer to the procedure report that was given to you for any specific questions about what was found during the examination.  If the procedure report does not answer your questions, please call your gastroenterologist to clarify.  If you requested that your care partner not be given the details of your procedure findings, then the procedure report has been included in a sealed envelope for you to review at your convenience later.  YOU SHOULD EXPECT: Some feelings of bloating in the abdomen. Passage of more gas than usual.  Walking can help get rid of the air that was put into your GI tract during the procedure and reduce the bloating. If you had a lower endoscopy (such as a colonoscopy or flexible sigmoidoscopy) you may notice spotting of blood in your stool or on the toilet paper. If you underwent a bowel prep for your procedure, then you may not have a normal bowel movement for a few days.  DIET: Your first meal following the procedure should be a light meal and then it is ok to progress to your normal diet.  A half-sandwich or bowl of soup is an example of a good first meal.  Heavy or fried foods are harder to digest and may make you feel nauseous or bloated.  Likewise meals heavy in dairy and vegetables can cause extra gas to form and this can also increase the bloating.  Drink plenty of fluids but you should avoid alcoholic beverages for 24 hours.  ACTIVITY: Your care partner should take you home directly after the procedure.  You should plan to take it easy, moving slowly for the rest of the day.  You can resume normal activity the day after the procedure however you should NOT DRIVE or use heavy machinery for 24 hours (because of the sedation medicines used during the test).    SYMPTOMS TO REPORT IMMEDIATELY: A gastroenterologist  can be reached at any hour.  During normal business hours, 8:30 AM to 5:00 PM Monday through Friday, call [phone].  After hours and on weekends, please call the GI answering service at [phone] who will take a message and have the physician on call contact you.   Following lower endoscopy (colonoscopy or flexible sigmoidoscopy):  Excessive amounts of blood in the stool  Significant tenderness or worsening of abdominal pains  Swelling of the abdomen that is new, acute  Fever of 100F or higher  FOLLOW UP: If any biopsies were taken you will be contacted by phone or by letter within the next 1-3 weeks.  Call your gastroenterologist if you have not heard about the biopsies in 3 weeks.  Our staff will call the home number listed on your records the next business day following your procedure to check on you and address any questions or concerns that you may have at that time regarding the information given to you following your procedure. This is a courtesy call and so if there is no answer at the home number and we have not heard from you through the emergency physician on call, we will assume that you have returned to your regular daily activities without incident.  SIGNATURES/CONFIDENTIALITY: You and/or your care partner have signed paperwork which will be entered into your electronic medical record.  These signatures attest to the fact that that the information above on your After Visit Summary has been reviewed   and is understood.  Full responsibility of the confidentiality of this discharge information lies with you and/or your care-partner.  

## 2011-10-17 ENCOUNTER — Telehealth: Payer: Self-pay | Admitting: *Deleted

## 2011-10-17 NOTE — Telephone Encounter (Signed)
  Follow up Call-  Call back number 10/16/2011 10/06/2011  Post procedure Call Back phone  # [phone] hm [phone]  Permission to leave phone message Yes Yes     Patient questions:  Do you have a fever, pain , or abdominal swelling? no Pain Score  0 *  Have you tolerated food without any problems? yes  Have you been able to return to your normal activities? yes  Do you have any questions about your discharge instructions: Diet   no Medications  no Follow up visit  no  Do you have questions or concerns about your Care? no  Actions: * If pain score is 4 or above: No action needed, pain <4.

## 2011-10-18 ENCOUNTER — Telehealth: Payer: Self-pay | Admitting: Gastroenterology

## 2011-10-19 NOTE — Telephone Encounter (Addendum)
Pt has still been having to eat very bland food and is beginning to have diarrhea very frequently again.  She is taking Align daily and imodium 3-4 daily.  She has also taken Pepto Bismol.  Pt would like to have her path results.  I did advise her that the results are not available yet, but I will call her as soon as available.  Pt agreed and ok's a message on the voice mail

## 2011-10-20 ENCOUNTER — Other Ambulatory Visit: Payer: Self-pay

## 2011-10-20 DIAGNOSIS — K52839 Microscopic colitis, unspecified: Secondary | ICD-10-CM

## 2011-10-20 MED ORDER — BUDESONIDE 3 MG PO CP24: 9.0000 mg | ORAL_CAPSULE | Freq: Every day | ORAL | Status: DC

## 2011-10-20 MED ORDER — BUDESONIDE 3 MG PO CP24: 3.0000 mg | ORAL_CAPSULE | Freq: Three times a day (TID) | ORAL | Status: DC

## 2011-10-20 NOTE — Telephone Encounter (Signed)
See path report

## 2011-10-24 ENCOUNTER — Telehealth: Payer: Self-pay | Admitting: Gastroenterology

## 2011-10-25 NOTE — Telephone Encounter (Signed)
Left message on machine to call back  

## 2011-10-25 NOTE — Telephone Encounter (Signed)
The pt has been on Budesonide for 5 days and still has diarrhea about 4-5 episodes per day.  The cramping has gotten better but she is still concerned about the diarrhea episodes.  What else can she try?  Please advise

## 2011-10-26 NOTE — Telephone Encounter (Signed)
[phone] cell Left message on machine to call back cell and home

## 2011-10-26 NOTE — Telephone Encounter (Signed)
Pt has been notified she will call if no better

## 2011-10-26 NOTE — Telephone Encounter (Signed)
Should stay on budesonide 3 pills a day and ADD two imodium every am shortly after she wakes up.

## 2011-11-08 ENCOUNTER — Encounter: Payer: Self-pay | Admitting: Gastroenterology

## 2011-11-08 ENCOUNTER — Ambulatory Visit (INDEPENDENT_AMBULATORY_CARE_PROVIDER_SITE_OTHER): Payer: Medicare Other | Admitting: Gastroenterology

## 2011-11-08 VITALS — BP 132/68 | HR 88 | Ht 63.0 in | Wt 186.0 lb

## 2011-11-08 DIAGNOSIS — K52839 Microscopic colitis, unspecified: Secondary | ICD-10-CM

## 2011-11-08 DIAGNOSIS — K5289 Other specified noninfective gastroenteritis and colitis: Secondary | ICD-10-CM

## 2011-11-08 NOTE — Progress Notes (Signed)
Review of pertinent gastrointestinal problems: 1. routine risk for colon cancer, no polyps on colonoscopy April 2013 2. microscopic, lymphocytic colitis noted on biopsies, colonoscopy April 2013 with normal colon, normal terminal ileum, random colon biopsies performed.  Started on budesonide 9mg  a day    HPI: This is a  pleasant 71 year old woman whom I last saw the time of colonoscopy about a month ago.   Has been on budesonide 9mg  a day for 3 weeks now.  The past 2-3 days, her stools have been "considerably better."  She does not have any cramps, the "burning in her gut," have improved.   She was having 2-3 loose stools a night at times, 3 times during the day.   Her loose stools are "definitely trending better."  She still drinks a lot of coffee.  Still takes 2 imodium a day.  Has not need any peptobismol at all.  Drinks 24 oz of coffee in AM, then no other caffinated bevs a day.     Past Medical History  Diagnosis Date  . Allergic rhinitis   . Diabetes mellitus   . Urinary tract infection   . Migraines   . Palpitations   . Asthma   . Hyperlipemia     Past Surgical History  Procedure Date  . Shoulder surgery   . Tonsillectomy   . Abdominal hysterectomy   . Deviated septum repair     Current Outpatient Prescriptions  Medication Sig Dispense Refill  . b complex vitamins capsule Take 1 capsule by mouth daily.        . bimatoprost (LUMIGAN) 0.03 % ophthalmic solution 1 drop at bedtime.        Marland Kitchen BIOTIN PO Take by mouth.      . brimonidine (ALPHAGAN P) 0.1 % SOLN as directed.      . budesonide (ENTOCORT EC) 3 MG 24 hr capsule Take 3 capsules (9 mg total) by mouth daily.  90 capsule  3  . EFFEXOR XR 75 MG 24 hr capsule Take 1 capsule (75 mg total) by mouth daily.  30 capsule  2  . esomeprazole (NEXIUM) 40 MG capsule Take 1 capsule (40 mg total) by mouth daily before breakfast.  30 capsule  2  . fenofibrate (TRICOR) 145 MG tablet Take 1 tablet (145 mg total) by mouth daily.   30 tablet  3  . fexofenadine-pseudoephedrine (ALLEGRA-D 24) 180-240 MG per 24 hr tablet Take 1 tablet by mouth daily.        . Fluticasone-Salmeterol (ADVAIR) 250-50 MCG/DOSE AEPB Inhale 1 puff into the lungs every 12 (twelve) hours.        . Loperamide HCl (IMODIUM PO) Take by mouth.      . milk thistle 175 MG tablet Take 175 mg by mouth daily.        . mometasone (NASONEX) 50 MCG/ACT nasal spray Place 2 sprays into the nose daily as needed.        . pioglitazone-metformin (ACTOPLUS MET) 15-500 MG per tablet Take 1 tablet by mouth 2 (two) times daily with a meal.        . VITAMIN D, CHOLECALCIFEROL, PO Take 1,000 Units by mouth daily.         Allergies as of 11/08/2011 - Review Complete 11/08/2011  Allergen Reaction Noted  . Papaya derivatives Anaphylaxis 08/23/2011  . Mushroom ext cmplx(shiitake-reishi-mait) Other (See Comments) 08/23/2011  . Venlafaxine Other (See Comments) 08/23/2011    Family History  Problem Relation Age of Onset  . Diabetes  Father   . Colon cancer Neg Hx   . Esophageal cancer Neg Hx   . Rectal cancer Neg Hx   . Stomach cancer Neg Hx     History   Social History  . Marital Status: Married    Spouse Name: N/A    Number of Children: 2  . Years of Education: N/A   Occupational History  . Retired -Research officer, political party    Social History Main Topics  . Smoking status: Former Games developer  . Smokeless tobacco: Never Used   Comment: smoked in college years ago  . Alcohol Use: 0.0 oz/week     1-2 glasses a night variety beer/wine/scotch  . Drug Use: No  . Sexually Active: Not on file   Other Topics Concern  . Not on file   Social History Narrative   Daily caffeine       Physical Exam: Ht 5\' 3"  (1.6 m)  Wt 186 lb (84.369 kg)  BMI 32.95 kg/m2 Constitutional: generally well-appearing Psychiatric: alert and oriented x3 Abdomen: soft, nontender, nondistended, no obvious ascites, no peritoneal signs, normal bowel sounds     Assessment and plan: 71 y.o.  female with microscopic, lymphocytic colitis  Partial response so far to budesonide 9 mg once daily. She will continue on this for another 3 weeks and then start to taper. She'll return here in 5-6 weeks and sooner if needed.

## 2011-11-08 NOTE — Patient Instructions (Addendum)
Continue budesonide 3 pills once a day for another 3 weeks. Then cut back to 2 pills once a day for 1 month, then cut back to 1 pill once a day. Return to see Dr. Christella Hartigan in 5-6 weeks. Continue imodium 2 pills in AM. Caffinated beverages and alcohol can contribute to loose stools.

## 2011-12-11 ENCOUNTER — Encounter: Payer: Self-pay | Admitting: Gastroenterology

## 2011-12-11 ENCOUNTER — Ambulatory Visit (INDEPENDENT_AMBULATORY_CARE_PROVIDER_SITE_OTHER): Payer: Medicare Other | Admitting: Gastroenterology

## 2011-12-11 VITALS — BP 130/70 | HR 70 | Ht 63.75 in | Wt 178.6 lb

## 2011-12-11 DIAGNOSIS — R197 Diarrhea, unspecified: Secondary | ICD-10-CM | POA: Diagnosis not present

## 2011-12-11 NOTE — Patient Instructions (Addendum)
Avoid dairy products or try lactaid supplement. Cut back on budesonide to 2 pills a day for one month, then 1 pill a day for one month. Take imodium if needed. Return to see Dr. Christella Hartigan in 2 months. You need to see a urologist again for blood in your urine, right pelvic pain improved with urination.

## 2011-12-11 NOTE — Progress Notes (Signed)
Review of pertinent gastrointestinal problems:  1. routine risk for colon cancer, no polyps on colonoscopy April 2013  2. microscopic, lymphocytic colitis noted on biopsies, colonoscopy April 2013 with normal colon, normal terminal ileum, random colon biopsies performed. Started on budesonide 9mg  a day   HPI: This is a  pleasant 71 year old woman whom I last saw one month ago.  She has not cut back the budesonide. Still on 3 pills a day.  Bowels are better. This AM, she had milk on cereal.    Having 2-4 semiformed bms a day.  She has cut back  A bit on caffeine.  Does not drink etoh lately.  She brought up blood in her urine again and asked me my opinion about the blood in her urine as well as some right sided pelvic pains that are relieved when she urinates.    Past Medical History  Diagnosis Date  . Allergic rhinitis   . Diabetes mellitus   . Urinary tract infection   . Migraines   . Palpitations   . Asthma   . Hyperlipemia   . Kidney stone     Past Surgical History  Procedure Date  . Rotator cuff repair     right  . Tonsillectomy   . Abdominal hysterectomy     Has ovaries  . Deviated septum repair     Current Outpatient Prescriptions  Medication Sig Dispense Refill  . b complex vitamins capsule Take 1 capsule by mouth daily.        . bimatoprost (LUMIGAN) 0.03 % ophthalmic solution 1 drop at bedtime.        Marland Kitchen BIOTIN PO Take by mouth.      . brimonidine (ALPHAGAN P) 0.1 % SOLN as directed.      . budesonide (ENTOCORT EC) 3 MG 24 hr capsule Take 3 capsules (9 mg total) by mouth daily.  90 capsule  3  . EFFEXOR XR 75 MG 24 hr capsule Take 1 capsule (75 mg total) by mouth daily.  30 capsule  2  . esomeprazole (NEXIUM) 40 MG capsule Take 1 capsule (40 mg total) by mouth daily before breakfast.  30 capsule  2  . fenofibrate (TRICOR) 145 MG tablet Take 1 tablet (145 mg total) by mouth daily.  30 tablet  3  . fexofenadine-pseudoephedrine (ALLEGRA-D 24) 180-240 MG per 24 hr  tablet Take 1 tablet by mouth daily.        . Fluticasone-Salmeterol (ADVAIR) 250-50 MCG/DOSE AEPB Inhale 1 puff into the lungs every 12 (twelve) hours.        . Loperamide HCl (IMODIUM PO) Take by mouth.      . milk thistle 175 MG tablet Take 175 mg by mouth daily.        . mometasone (NASONEX) 50 MCG/ACT nasal spray Place 2 sprays into the nose daily as needed.        . pioglitazone-metformin (ACTOPLUS MET) 15-500 MG per tablet Take 1 tablet by mouth 2 (two) times daily with a meal.        . Probiotic Product (ALIGN PO) Take by mouth.      Marland Kitchen VITAMIN D, CHOLECALCIFEROL, PO Take 1,000 Units by mouth daily.         Allergies as of 12/11/2011 - Review Complete 12/11/2011  Allergen Reaction Noted  . Papaya derivatives Anaphylaxis 08/23/2011  . Mushroom ext cmplx(shiitake-reishi-mait) Other (See Comments) 08/23/2011  . Venlafaxine Other (See Comments) 08/23/2011    Family History  Problem Relation Age of  Onset  . Diabetes Father   . Colon cancer Neg Hx   . Esophageal cancer Neg Hx   . Rectal cancer Neg Hx   . Stomach cancer Neg Hx   . Breast cancer Neg Hx     History   Social History  . Marital Status: Married    Spouse Name: N/A    Number of Children: 2  . Years of Education: N/A   Occupational History  . Retired -Research officer, political party    Social History Main Topics  . Smoking status: Former Smoker    Types: Cigarettes  . Smokeless tobacco: Never Used   Comment: smoked in college years ago  . Alcohol Use: 0.0 oz/week     1-2 glasses a night variety beer  . Drug Use: No  . Sexually Active: Not on file   Other Topics Concern  . Not on file   Social History Narrative   Daily caffeine       Physical Exam: BP 130/70  Pulse 70  Ht 5' 3.75" (1.619 m)  Wt 178 lb 9.6 oz (81.012 kg)  BMI 30.90 kg/m2 Constitutional: generally well-appearing Psychiatric: alert and oriented x3 Abdomen: soft, nontender, nondistended, no obvious ascites, no peritoneal signs, normal bowel  sounds     Assessment and plan: 71 y.o. female with microscopic colitis, and blood in urine  I recommended she see her urologist again for the blood in her urine. For microscopic colitis she should start cutting back as I have recommended she do a month ago on the budesonide. I gave her a tapering schedule and she'll return to see me in 7-8 weeks, sooner if needed.

## 2011-12-14 ENCOUNTER — Encounter: Payer: Self-pay | Admitting: Family Medicine

## 2011-12-14 ENCOUNTER — Ambulatory Visit (INDEPENDENT_AMBULATORY_CARE_PROVIDER_SITE_OTHER): Payer: Medicare Other | Admitting: Family Medicine

## 2011-12-14 VITALS — BP 125/80 | HR 84 | Temp 97.9°F | Ht 63.5 in | Wt 178.4 lb

## 2011-12-14 DIAGNOSIS — Z780 Asymptomatic menopausal state: Secondary | ICD-10-CM

## 2011-12-14 DIAGNOSIS — R3989 Other symptoms and signs involving the genitourinary system: Secondary | ICD-10-CM

## 2011-12-14 DIAGNOSIS — R319 Hematuria, unspecified: Secondary | ICD-10-CM

## 2011-12-14 DIAGNOSIS — R301 Vesical tenesmus: Secondary | ICD-10-CM

## 2011-12-14 DIAGNOSIS — E739 Lactose intolerance, unspecified: Secondary | ICD-10-CM | POA: Diagnosis not present

## 2011-12-14 LAB — POCT URINALYSIS DIPSTICK
Glucose, UA: NEGATIVE
Protein, UA: NEGATIVE
Spec Grav, UA: 1.02
Urobilinogen, UA: 0.2

## 2011-12-14 NOTE — Progress Notes (Signed)
  Subjective:    Patient ID: [name], female    DOB: 03/29/1941, 71 y.o.   MRN: [id number]  HPI Urinary sxs- pt has hx of tea colored urine, continues to have R pelvic pain when she needs to urinate.  Had 1 episode of 'bloody brown urine last week'.  Has seen uro for this and has not had sxs in 'months'. That episode was painless.  Urine is now 'back to normal'.  Reports bowels are improving w/ Dr Christella Hartigan help.  They have discovered that she is lactose intolerant and this has pt concerned about osteoporosis due to limited dairy/calcium intake.  Has not had DEXA in 'years'.  R pelvic pain will worsen if pt has held urine for any period of time.  Pain will relieve s/p urination- particularly in the AM which is when sxs are the worst.  Pt knows it's 'b/c i hold my urine all night'.  Denies dysuria, frequency, urgency.   Review of Systems For ROS see HPI     Objective:   Physical Exam  Vitals reviewed. Constitutional: She appears well-developed and well-nourished. No distress.  Abdominal: Soft. Bowel sounds are normal. She exhibits no distension. There is no tenderness. There is no rebound and no guarding.          Assessment & Plan:

## 2011-12-14 NOTE — Patient Instructions (Addendum)
We'll call you with your urology appt Call us and let us know which imaging center you prefer for the bone density Call with any questions or concerns- you look great!!! Have a great summer!!!

## 2011-12-15 ENCOUNTER — Telehealth: Payer: Self-pay | Admitting: Family Medicine

## 2011-12-15 LAB — CULTURE, URINE COMPREHENSIVE: Colony Count: NO GROWTH

## 2011-12-15 NOTE — Telephone Encounter (Signed)
gets mammogram & Bone density at [facility]  Patient call back # [phone]

## 2011-12-17 NOTE — Assessment & Plan Note (Signed)
New.  Pt reports she has discovered this w/ the help of Dr Christella Hartigan (who she likes very much).  But due to this, she gets limited amounts of calcium in diet.  Needs DEXA.  Encouraged her to take OTC Ca supplement.  Will follow.

## 2011-12-17 NOTE — Assessment & Plan Note (Signed)
Recurrent issue for pt.  Has seen urology previously but w/u was WNL.  Pt again had 'tea colored urine' last week.  Episode was painless and pt reports urine is now 'back to normal' despite large blood present in UA.  Will refer back to urology for additional evaluation.  Pt expressed understanding and is in agreement w/ plan.

## 2011-12-17 NOTE — Assessment & Plan Note (Signed)
New.  Pt has had persistent RLQ/pelvic pain.  Has had normal GI and urologic workups in regards to pain.  Pt describes sxs as worse when holding urine and relieved w/ urination.  Sounds like painful bladder spasm.  Will refer to urology for possible urodynamics.  Pt expressed understanding and is in agreement w/ plan.

## 2011-12-17 NOTE — Assessment & Plan Note (Signed)
Check DEXA.  Pt to call w/ preferred imaging center.

## 2011-12-18 ENCOUNTER — Encounter: Payer: Self-pay | Admitting: *Deleted

## 2011-12-19 ENCOUNTER — Other Ambulatory Visit: Payer: Self-pay | Admitting: Family Medicine

## 2011-12-19 DIAGNOSIS — Z1231 Encounter for screening mammogram for malignant neoplasm of breast: Secondary | ICD-10-CM

## 2011-12-19 NOTE — Telephone Encounter (Signed)
Patient scheduled for dexa & mammo at [facility].  I have left message for patient to return my call.

## 2012-01-04 ENCOUNTER — Other Ambulatory Visit: Payer: Self-pay | Admitting: Urology

## 2012-01-04 DIAGNOSIS — R310 Gross hematuria: Secondary | ICD-10-CM | POA: Diagnosis not present

## 2012-01-04 DIAGNOSIS — N200 Calculus of kidney: Secondary | ICD-10-CM | POA: Diagnosis not present

## 2012-01-04 DIAGNOSIS — R1031 Right lower quadrant pain: Secondary | ICD-10-CM | POA: Diagnosis not present

## 2012-01-08 ENCOUNTER — Telehealth: Payer: Self-pay | Admitting: Family Medicine

## 2012-01-08 DIAGNOSIS — R102 Pelvic and perineal pain: Secondary | ICD-10-CM

## 2012-01-08 NOTE — Telephone Encounter (Signed)
[id number] referral in pt chart with additional notation to send to [facility] per MD Tabori suggestion, called pt to advise that someone will call her with the apt once they have one, pt understood, pt notes she is in severe pain, this nurse asked if she is still taking the pain medication prescribed, pt advised she stopped taking about the medication per thought this was possibly causing her GI issues, also noted that she was told a kidney stone that she will have removed next month per upcoming apt, however MD advised that the pain is more likely from the scar tissue, pt advised she still has a current Hydrocodone 500mg  that MD Tabori prescribed for her 3-13, pt noted she will start taking this medication again and if this does not help her pain she will call our office again to advise if this is not helping her pain, pt also advised if her sxs worsen to please go to the ER, pt understood all instructions, advised that we will put a rush on the GYN referral, called Helen at [facility] per referral rep out of office today, was advised they will call me back when Myriam Jacobson returns to office

## 2012-01-08 NOTE — Telephone Encounter (Signed)
Any GYN can do laprascopic surgery.  We typically use [facility] or [facility]

## 2012-01-08 NOTE — Telephone Encounter (Signed)
Received incoming call from [facility] whom advised that she will put a rush on pt GYN referral and get back to the pt asap. MD Beverely Low made aware verbally

## 2012-01-08 NOTE — Telephone Encounter (Signed)
Patient states she needs a referral for a gynecologist who performs surgery through the navel. The kidney specialist we referred her to states she has scar tissue that is causing her pain.

## 2012-01-08 NOTE — Telephone Encounter (Signed)
Noted  

## 2012-01-08 NOTE — Telephone Encounter (Signed)
Please advise if knowledge of such GYN per referral rep out of office today

## 2012-01-09 ENCOUNTER — Ambulatory Visit
Admission: RE | Admit: 2012-01-09 | Discharge: 2012-01-09 | Disposition: A | Discharge status: Home / Self Care | Payer: Medicare Other | Source: Ambulatory Visit | Attending: Family Medicine | Admitting: Family Medicine

## 2012-01-09 DIAGNOSIS — Z1231 Encounter for screening mammogram for malignant neoplasm of breast: Secondary | ICD-10-CM

## 2012-01-09 DIAGNOSIS — Z780 Asymptomatic menopausal state: Secondary | ICD-10-CM | POA: Diagnosis not present

## 2012-01-09 DIAGNOSIS — E739 Lactose intolerance, unspecified: Secondary | ICD-10-CM

## 2012-01-09 DIAGNOSIS — Z13820 Encounter for screening for osteoporosis: Secondary | ICD-10-CM | POA: Diagnosis not present

## 2012-01-10 ENCOUNTER — Encounter: Payer: Self-pay | Admitting: *Deleted

## 2012-01-10 DIAGNOSIS — R1031 Right lower quadrant pain: Secondary | ICD-10-CM | POA: Diagnosis not present

## 2012-01-15 ENCOUNTER — Telehealth: Payer: Self-pay | Admitting: Family Medicine

## 2012-01-15 MED ORDER — EFFEXOR XR 75 MG PO CP24: 75.0000 mg | ORAL_CAPSULE | Freq: Every day | ORAL | Status: DC

## 2012-01-15 NOTE — Telephone Encounter (Signed)
rx sent to pharmacy by e-script  

## 2012-01-15 NOTE — Telephone Encounter (Signed)
Refill: effexor er 75mg . Take one capsule each day. Qty 30. Last fill 12-14-11

## 2012-01-17 DIAGNOSIS — R1031 Right lower quadrant pain: Secondary | ICD-10-CM | POA: Diagnosis not present

## 2012-01-18 ENCOUNTER — Telehealth: Payer: Self-pay | Admitting: Gastroenterology

## 2012-01-18 NOTE — Telephone Encounter (Signed)
Randie Heinz, i agree

## 2012-01-18 NOTE — Telephone Encounter (Signed)
Pt called because she is having some constipation and would like to know what she can try.  She is going to use 2 caps of miralax this morning and again tomorrow until she has a bowel movement and then daily unless diarrhea develops.  She will call if she doesn't get any results tomorrow.

## 2012-01-19 ENCOUNTER — Telehealth: Payer: Self-pay

## 2012-01-19 NOTE — Telephone Encounter (Signed)
Pt has used 4 doses of miralax yesterday and 2 doses today she has had several bowel movements today and pain has gotten better.  She will call back if not 100% better after the weekend.

## 2012-01-22 ENCOUNTER — Telehealth: Payer: Self-pay | Admitting: Gastroenterology

## 2012-01-22 DIAGNOSIS — M999 Biomechanical lesion, unspecified: Secondary | ICD-10-CM | POA: Diagnosis not present

## 2012-01-22 DIAGNOSIS — M5137 Other intervertebral disc degeneration, lumbosacral region: Secondary | ICD-10-CM | POA: Diagnosis not present

## 2012-01-22 DIAGNOSIS — IMO0002 Reserved for concepts with insufficient information to code with codable children: Secondary | ICD-10-CM | POA: Diagnosis not present

## 2012-01-22 NOTE — Telephone Encounter (Signed)
Pt continues to use 2 doses of miralax everyday and is having but the pain continues under her right rib cage.  Pt feels like she is not emptying her bowels.  Her GYN told her she has large amount of stool on her right side per an [place].  Pt is having her [place] results faxed to Dr Christella Hartigan for his review.  Pt not sure what else to try.  She has appt on 02/13/12 with Dr Christella Hartigan.  Please advise

## 2012-01-23 NOTE — Telephone Encounter (Signed)
I don't think she is obstructed with stool.  She is having BMs with miralax.  Also colonoscopy recently.  Plan on rov already scheduled.

## 2012-01-23 NOTE — Telephone Encounter (Signed)
He had lymphocytic colitis with diarrhea, now is bothered by feeling "impacted" and "incomplete evacution" on miralax.  I'm not sure what is going on with her.  Have no advice until I see her in office.

## 2012-01-23 NOTE — Telephone Encounter (Signed)
Note was faxed to me by the pt that states she does not want me to bother Dr Christella Hartigan about her problem she has figured it out by her own research.  Dr Christella Hartigan was given the letter for review

## 2012-01-23 NOTE — Telephone Encounter (Signed)
Pt very upset that she has been given no further advice.  She says she has a "blockage" and was told so by her GYN and she is not sure why Dr Christella Hartigan has no time for her.  She asked to be admitted to the hospital and have the stool removed.  I advised her that I would send Dr Christella Hartigan this message.  I did tell the pt that she can have stool in her colon and NOT have an obstruction.  She has been passing gas and loose stools since she began the miralax, she doesn't feel like she has emptied,  She is also concerned because she is having lithortripsy on Monday.  Please advise

## 2012-01-24 ENCOUNTER — Encounter (HOSPITAL_COMMUNITY): Payer: Self-pay | Admitting: *Deleted

## 2012-01-24 NOTE — Progress Notes (Signed)
Spoke to patient via phone,history obtained,updated.  Bring blue folder,insurance cards,picture ID,designated driver and living will,POA, if desires (to be placed on chart). Reinforced no aspirin(instructions to hold aspirin per your doctor), ibuprofen products 72 hours prior to procedure. No vitamins or herbal medicines 7 days prior to procedure.   Follow laxative instructions provided by urologist (office) and in blue folder. Wear easy on/off clothing and no jewelry except wedding rings and ear rings. Leave all other valuables at home. Verbalizes understanding of instructions  To arrive [facility] short stay parking lot 08 12 2013 at 0530, npo after mn

## 2012-01-25 NOTE — H&P (Signed)
History of Present Illness     Right lower quadrant pain. The patient reports she has had intermittent pain in the right lower quadrant and that seems to be most pronounced in the morning when she awakens and her bladder is full. It also occurs at night when she gets up to urinate. The discomfort is relieved after about 15 minutes once she has voided. It does not resolve immediately with voiding. She has a history of scar tissue/adhesions from her hysterectomy that has caused difficulty with previous colonoscopies.  Left renal calculus. She underwent a CT scan in 3/13 which revealed a nonobstructing 9 mm stone in the left kidney located in the area of the renal pelvis with Hounsfield units of approximately 900. No right renal calculi were identified at that time. She was told about 20 years ago that she had a stone in her left kidney. She's not passed any stones.  Gross hematuria. She had an episode of gross hematuria in 3/13. A urine culture done at that time was negative. Her urine remained clear but then gross hematuria was again noted in 6/13. This is almost certainly due to her left renal pelvic stone.   Past Medical History Problems  1. History of  Diabetes Mellitus 250.00 2. History of  Gastric Ulcer 531.90  Surgical History Problems  1. History of  Back Surgery 2. History of  Hysteroscopy Of Uterus 3. History of  Shoulder Surgery Right 4. History of  Tonsillectomy  Current Meds 1. Align; Therapy: (Recorded:18Jul2013) to 2. Budesonide ER 3 MG Oral Capsule Extended Release 24 Hour; Therapy: 03May2013 to 3. Cetirizine HCl 10 MG Oral Tablet; Therapy: (Recorded:18Jul2013) to 4. Effexor XR 75 MG Oral Capsule Extended Release 24 Hour; Therapy: 17Dec2012 to 5. Fenofibrate 145 MG Oral Tablet; Therapy: 07Nov2012 to 6. Imodium Advanced TABS; TAKE TABLET  PRN; Therapy: (Recorded:18Jul2013) to 7. Lumigan 0.01 % Ophthalmic Solution; Therapy: 09Mar2012 to 8. Pioglitazone HCl-Metformin HCl  15-850 MG Oral Tablet; Therapy: 11Oct2012 to  Allergies Medication  1. Venlafaxine HCl TABS  Family History Problems  1. Paternal history of  Asthma V17.5 2. Family history of  Death In The Family Father 3. Family history of  Death In The Family Mother 4. Paternal history of  Diabetes Mellitus V18.0 5. Maternal history of  Esophageal Reflux 6. Maternal history of  Glaucoma V19.11 7. Paternal history of  Stroke Syndrome V17.1  Social History Problems    Alcohol Use   Caffeine Use   Former Smoker V15.82   Marital History - Currently Married   Retired From Work Denied    History of  Tobacco Use  Review of Systems Genitourinary, constitutional, skin, eye, otolaryngeal, hematologic/lymphatic, cardiovascular, pulmonary, endocrine, musculoskeletal, gastrointestinal, neurological and psychiatric system(s) were reviewed and pertinent findings if present are noted.  Genitourinary: nocturia, incontinence and hematuria.  Gastrointestinal: abdominal pain and diarrhea.  Constitutional: feeling tired (fatigue).  Musculoskeletal: back pain and joint pain.    Vitals Vital Signs  BMI Calculated: 30.97 BSA Calculated: 1.84 Height: 5 ft 3.5 in Weight: 177 lb  Blood Pressure: 125 / 77 Temperature: 97.6 F Heart Rate: 75  Physical Exam Constitutional: Well nourished and well developed . No acute distress.  ENT:. The ears and nose are normal in appearance.  Neck: The appearance of the neck is normal and no neck mass is present.  Pulmonary: No respiratory distress and normal respiratory rhythm and effort.  Cardiovascular: Heart rate and rhythm are normal . No peripheral edema.  Abdomen: The abdomen is soft  and nontender. No masses are palpated. No CVA tenderness. No hernias are palpable. No hepatosplenomegaly noted.  Lymphatics: The femoral and inguinal nodes are not enlarged or tender.  Skin: Normal skin turgor, no visible rash and no visible skin lesions.  Neuro/Psych:. Mood and  affect are appropriate.    Results/Data Urine     COLOR YELLOW   APPEARANCE CLEAR   SPECIFIC GRAVITY 1.025   pH 6.0   GLUCOSE NEG mg/dL  BILIRUBIN NEG   KETONE NEG mg/dL  BLOOD NEG   PROTEIN NEG mg/dL  UROBILINOGEN 0.2 mg/dL  NITRITE NEG   LEUKOCYTE ESTERASE NEG    Old records or history reviewed: Notes from Dr. Rennis Golden office as above.  The following images/tracing/specimen were independently visualized:  CT scan as above.  The following clinical lab reports were reviewed:  Urine culture as above.  The following radiology reports were reviewed: CT scan.    Procedure  Procedure: Cystoscopy   Indication: Hematuria.  Informed Consent: Risks, benefits, and potential adverse events were discussed and informed consent was obtained from the patient.  Prep: The patient was prepped with betadine.  Procedure Note:  Urethral meatus:. No abnormalities.  Anterior urethra: No abnormalities.  Prostatic urethra: No abnormalities.  Bladder: Visulization was clear. The ureteral orifices were in the normal anatomic position bilaterally and had clear efflux of urine. A systematic survey of the bladder demonstrated no bladder tumors or stones. The mucosa was smooth without abnormalities. The patient tolerated the procedure well.  Complications: None.    Assessment Assessed  1. Abdominal Pain In The Right Lower Belly (RLQ) 789.03 2. Gross Hematuria 599.71 3. Nephrolithiasis Of The Left Kidney 592.0      As far as the left renal calculus does she says she has had a stone in her left kidney dating back 20 years however I suspect it was more peripherally located and not located at the ureteropelvic junction/renal pelvic region where it is currently located and I think this is what is causing her intermittent gross hematuria. We therefore have discussed treating the stone with lithotripsy. Ureteroscopy was also discussed as an alternative option. I went over each of these procedures with her in  detail including the risks, complications and limitations. She has elected to proceed with lithotripsy. I have discussed this procedure with her in detail including its risks and complications and anticipated probability of success.  Her gross hematuria is almost certainly from her stone. Cystoscopically I found no abnormality of her bladder. The pain she is experiencing in her right lower quadrant sounds like it is due to scar tissue from her previous hysterectomy it does not appear to be intrinsic to the bladder itself. We also discussed that I saw nothing on her CT scan in the right lower quadrant that could cause the pain either. She has not seen a gynecologist and I think she may benefit from that. It's possible that laparoscopy may be necessary for both diagnostic and therapeutic reasons.   Plan    She will be scheduled for lithotripsy of her left renal calculus.

## 2012-01-26 ENCOUNTER — Encounter (HOSPITAL_COMMUNITY): Payer: Self-pay | Admitting: Pharmacy Technician

## 2012-01-29 ENCOUNTER — Ambulatory Visit (HOSPITAL_COMMUNITY): Payer: Medicare Other

## 2012-01-29 ENCOUNTER — Ambulatory Visit (HOSPITAL_COMMUNITY)
Admission: RE | Admit: 2012-01-29 | Discharge: 2012-01-29 | Disposition: A | Discharge status: Home / Self Care | Payer: Medicare Other | Source: Ambulatory Visit | Attending: Urology | Admitting: Urology

## 2012-01-29 ENCOUNTER — Encounter (HOSPITAL_COMMUNITY): Payer: Self-pay

## 2012-01-29 ENCOUNTER — Encounter (HOSPITAL_COMMUNITY)
Admission: RE | Disposition: A | Discharge status: Home / Self Care | Payer: Self-pay | Source: Ambulatory Visit | Attending: Urology

## 2012-01-29 DIAGNOSIS — N200 Calculus of kidney: Secondary | ICD-10-CM

## 2012-01-29 DIAGNOSIS — E119 Type 2 diabetes mellitus without complications: Secondary | ICD-10-CM | POA: Insufficient documentation

## 2012-01-29 DIAGNOSIS — Z79899 Other long term (current) drug therapy: Secondary | ICD-10-CM | POA: Insufficient documentation

## 2012-01-29 LAB — GLUCOSE, CAPILLARY: Glucose-Capillary: 105 mg/dL — ABNORMAL HIGH (ref 70–99)

## 2012-01-29 SURGERY — LITHOTRIPSY, ESWL
Anesthesia: LOCAL | Laterality: Left

## 2012-01-29 MED ORDER — SODIUM CHLORIDE 0.9 % IV SOLN
INTRAVENOUS | Status: DC
  Administered 2012-01-29: 07:00:00 via INTRAVENOUS

## 2012-01-29 MED ORDER — OXYCODONE-ACETAMINOPHEN 5-325 MG PO TABS: 1.0000 | ORAL_TABLET | ORAL | Status: AC | PRN

## 2012-01-29 MED ORDER — DIPHENHYDRAMINE HCL 25 MG PO CAPS
25.0000 mg | ORAL_CAPSULE | ORAL | Status: AC
  Administered 2012-01-29: 25 mg via ORAL
  Filled 2012-01-29: qty 1

## 2012-01-29 MED ORDER — DIAZEPAM 5 MG PO TABS
10.0000 mg | ORAL_TABLET | ORAL | Status: AC
  Administered 2012-01-29: 10 mg via ORAL
  Filled 2012-01-29: qty 2

## 2012-01-29 MED ORDER — TAMSULOSIN HCL 0.4 MG PO CAPS: 0.4000 mg | ORAL_CAPSULE | Freq: Every day | ORAL | Status: DC

## 2012-01-29 MED ORDER — CIPROFLOXACIN HCL 500 MG PO TABS
500.0000 mg | ORAL_TABLET | ORAL | Status: AC
  Administered 2012-01-29: 500 mg via ORAL
  Filled 2012-01-29: qty 1

## 2012-01-29 NOTE — Progress Notes (Signed)
Denies aspirin, advil or toradol

## 2012-01-29 NOTE — Op Note (Signed)
See [facility] OP note scanned into chart. 

## 2012-01-29 NOTE — Interval H&P Note (Signed)
History and Physical Interval Note:  01/29/2012 7:32 AM  [name]  has presented today for surgery, with the diagnosis of Left Renal Pelvis Stone  The various methods of treatment have been discussed with the patient and family. After consideration of risks, benefits and other options for treatment, the patient has consented to  Procedure(s) (LRB): EXTRACORPOREAL SHOCK WAVE LITHOTRIPSY (ESWL) (Left) as a surgical intervention .  The patient's history has been reviewed, patient examined, no change in status, stable for surgery.  I have reviewed the patient's chart and labs.  Questions were answered to the patient's satisfaction.     [place]ett Farm

## 2012-01-30 ENCOUNTER — Encounter (HOSPITAL_BASED_OUTPATIENT_CLINIC_OR_DEPARTMENT_OTHER): Payer: Self-pay | Admitting: *Deleted

## 2012-01-30 ENCOUNTER — Emergency Department (HOSPITAL_BASED_OUTPATIENT_CLINIC_OR_DEPARTMENT_OTHER)
Admission: EM | Admit: 2012-01-30 | Discharge: 2012-01-30 | Disposition: A | Discharge status: Home / Self Care | Payer: Medicare Other | Attending: Emergency Medicine | Admitting: Emergency Medicine

## 2012-01-30 ENCOUNTER — Emergency Department (HOSPITAL_BASED_OUTPATIENT_CLINIC_OR_DEPARTMENT_OTHER): Payer: Medicare Other

## 2012-01-30 DIAGNOSIS — N23 Unspecified renal colic: Secondary | ICD-10-CM | POA: Diagnosis not present

## 2012-01-30 DIAGNOSIS — E785 Hyperlipidemia, unspecified: Secondary | ICD-10-CM | POA: Insufficient documentation

## 2012-01-30 DIAGNOSIS — Z79899 Other long term (current) drug therapy: Secondary | ICD-10-CM | POA: Diagnosis not present

## 2012-01-30 DIAGNOSIS — R109 Unspecified abdominal pain: Secondary | ICD-10-CM | POA: Insufficient documentation

## 2012-01-30 DIAGNOSIS — Z9889 Other specified postprocedural states: Secondary | ICD-10-CM | POA: Diagnosis not present

## 2012-01-30 DIAGNOSIS — Z87442 Personal history of urinary calculi: Secondary | ICD-10-CM | POA: Diagnosis not present

## 2012-01-30 DIAGNOSIS — N201 Calculus of ureter: Secondary | ICD-10-CM | POA: Diagnosis not present

## 2012-01-30 DIAGNOSIS — E119 Type 2 diabetes mellitus without complications: Secondary | ICD-10-CM | POA: Insufficient documentation

## 2012-01-30 DIAGNOSIS — N200 Calculus of kidney: Secondary | ICD-10-CM | POA: Diagnosis not present

## 2012-01-30 LAB — BASIC METABOLIC PANEL
CO2: 21 mEq/L (ref 19–32)
Glucose, Bld: 159 mg/dL — ABNORMAL HIGH (ref 70–99)
Potassium: 3.4 mEq/L — ABNORMAL LOW (ref 3.5–5.1)
Sodium: 138 mEq/L (ref 135–145)

## 2012-01-30 LAB — URINE MICROSCOPIC-ADD ON

## 2012-01-30 LAB — CBC
HCT: 35.9 % — ABNORMAL LOW (ref 36.0–46.0)
Hemoglobin: 12.6 g/dL (ref 12.0–15.0)
MCH: 29 pg (ref 26.0–34.0)
RBC: 4.35 MIL/uL (ref 3.87–5.11)

## 2012-01-30 LAB — URINALYSIS, ROUTINE W REFLEX MICROSCOPIC
Bilirubin Urine: NEGATIVE
Glucose, UA: NEGATIVE mg/dL
Nitrite: NEGATIVE
Specific Gravity, Urine: 1.019 (ref 1.005–1.030)
pH: 5.5 (ref 5.0–8.0)

## 2012-01-30 MED ORDER — MORPHINE SULFATE 4 MG/ML IJ SOLN: 6.0000 mg | Freq: Once | INTRAMUSCULAR | Status: DC

## 2012-01-30 MED ORDER — ONDANSETRON HCL 4 MG/2ML IJ SOLN: 4.0000 mg | Freq: Once | INTRAMUSCULAR | Status: DC

## 2012-01-30 MED ORDER — MORPHINE SULFATE 10 MG/ML IJ SOLN
INTRAMUSCULAR | Status: AC
  Administered 2012-01-30: 6 mg
  Filled 2012-01-30: qty 1

## 2012-01-30 MED ORDER — KETOROLAC TROMETHAMINE 30 MG/ML IJ SOLN
30.0000 mg | Freq: Once | INTRAMUSCULAR | Status: AC
  Administered 2012-01-30: 30 mg via INTRAVENOUS
  Filled 2012-01-30: qty 1

## 2012-01-30 MED ORDER — HYDROMORPHONE HCL PF 1 MG/ML IJ SOLN
1.0000 mg | Freq: Once | INTRAMUSCULAR | Status: AC
  Administered 2012-01-30: 1 mg via INTRAVENOUS
  Filled 2012-01-30: qty 1

## 2012-01-30 MED ORDER — ONDANSETRON HCL 4 MG/2ML IJ SOLN
INTRAMUSCULAR | Status: AC
  Administered 2012-01-30: 4 mg
  Filled 2012-01-30: qty 2

## 2012-01-30 NOTE — ED Provider Notes (Signed)
History     CSN: [id number]  Arrival date & time 01/30/12  0228   First MD Initiated Contact with Patient 01/30/12 0239      Chief Complaint  Patient presents with  . Flank Pain    (Consider location/radiation/quality/duration/timing/severity/associated sxs/prior treatment) The history is provided by the patient.   the patient lithotripsy 18 hours ago for a 13 mm left kidney stone.  She reports she did well throughout the day and then was awoken tonight in the middle the night with severe left flank pain radiating towards her left groin.  She had nausea without vomiting.  She denies fevers or chills.  Her pain is severe at this time.  She denies upper abdominal pain.  She has no other complaints.  Past Medical History  Diagnosis Date  . Allergic rhinitis   . Diabetes mellitus   . Urinary tract infection   . Migraines   . Palpitations   . Asthma   . Hyperlipemia   . Kidney stone     Past Surgical History  Procedure Date  . Rotator cuff repair     right  . Tonsillectomy   . Abdominal hysterectomy     Has ovaries  . Deviated septum repair   . Lithotripsy     Family History  Problem Relation Age of Onset  . Diabetes Father   . Colon cancer Neg Hx   . Esophageal cancer Neg Hx   . Rectal cancer Neg Hx   . Stomach cancer Neg Hx   . Breast cancer Neg Hx     History  Substance Use Topics  . Smoking status: Former Smoker    Types: Cigarettes  . Smokeless tobacco: Never Used   Comment: smoked in college years ago  . Alcohol Use: 0.0 oz/week     1-2 glasses a night variety beer    OB History    Grav Para Term Preterm Abortions TAB SAB Ect Mult Living   2 2              Review of Systems  All other systems reviewed and are negative.    Allergies  Papain; Papaya derivatives; Mushroom ext cmplx(shiitake-reishi-mait); and Venlafaxine  Home Medications   Current Outpatient Rx  Name Route Sig Dispense Refill  . B COMPLEX VITAMINS PO CAPS Oral Take 1  capsule by mouth daily.     Marland Kitchen BIMATOPROST 0.03 % OP SOLN Left Eye Place 1 drop into the left eye at bedtime.     Marland Kitchen CALCIUM CARB-CHOLECALCIFEROL 600-800 MG-UNIT PO TABS Oral Take 1 tablet by mouth 2 (two) times daily.    Marland Kitchen CETIRIZINE HCL 10 MG PO TABS Oral Take 10 mg by mouth daily.    . FENOFIBRATE 145 MG PO TABS Oral Take 145 mg by mouth daily after breakfast.    . OVER THE COUNTER MEDICATION Oral Take 1 tablet by mouth 2 (two) times daily. Glucosamine 1500mg  chondroitin 1200mg     . OXYCODONE-ACETAMINOPHEN 5-325 MG PO TABS Oral Take 1 tablet by mouth every 4 (four) hours as needed for pain. 30 tablet 0  . PIOGLITAZONE HCL-METFORMIN HCL 15-850 MG PO TABS Oral Take 1 tablet by mouth daily after breakfast.     . TAMSULOSIN HCL 0.4 MG PO CAPS Oral Take 1 capsule (0.4 mg total) by mouth daily after supper. 30 capsule   . VENLAFAXINE HCL ER 75 MG PO CP24 Oral Take 75 mg by mouth daily after breakfast.    . VITAMIN D (CHOLECALCIFEROL) PO  Oral Take 1,000 Units by mouth daily.       BP 179/75  Pulse 76  Temp 98.7 F (37.1 C) (Oral)  Resp 18  SpO2 98%  Physical Exam  Nursing note and vitals reviewed. Constitutional: She is oriented to person, place, and time. She appears well-developed and well-nourished. No distress.       Uncomfortable appearing  HENT:  Head: Normocephalic and atraumatic.  Eyes: EOM are normal.  Neck: Normal range of motion.  Cardiovascular: Normal rate, regular rhythm and normal heart sounds.   Pulmonary/Chest: Effort normal and breath sounds normal.  Abdominal: Soft. She exhibits no distension. There is no tenderness.  Musculoskeletal: Normal range of motion.  Neurological: She is alert and oriented to person, place, and time.  Skin: Skin is warm and dry.  Psychiatric: She has a normal mood and affect. Judgment normal.    ED Course  Procedures (including critical care time)  Labs Reviewed  URINALYSIS, ROUTINE W REFLEX MICROSCOPIC - Abnormal; Notable for the  following:    APPearance CLOUDY (*)     Hgb urine dipstick LARGE (*)     Ketones, ur 15 (*)     Leukocytes, UA TRACE (*)     All other components within normal limits  CBC - Abnormal; Notable for the following:    HCT 35.9 (*)     All other components within normal limits  BASIC METABOLIC PANEL - Abnormal; Notable for the following:    Potassium 3.4 (*)     Glucose, Bld 159 (*)     GFR calc non Af Amer 72 (*)     GFR calc Af Amer 83 (*)     All other components within normal limits  URINE MICROSCOPIC-ADD ON - Abnormal; Notable for the following:    Squamous Epithelial / LPF FEW (*)     Bacteria, UA FEW (*)     Crystals CA OXALATE CRYSTALS (*)     All other components within normal limits   Ct Abdomen Pelvis Wo Contrast  01/30/2012  *RADIOLOGY REPORT*  Clinical Data: Recent lithotripsy.  Lithotripsy for left renal stone.  Severe abdominal pain.  Hysterectomy.  CT ABDOMEN AND PELVIS WITHOUT CONTRAST  Technique:  Multidetector CT imaging of the abdomen and pelvis was performed following the standard protocol without intravenous contrast.  Comparison: 08/23/2011.  Findings: 4 mm right lower lobe pulmonary nodule again noted. Fragmentation of previously seen calculus in the left renal pelvis. Small collecting system calculi remain present.  The largest calculi measure 6 mm and 7 mm in the upper lower pole.  Fragmented calculi are present within the left renal pelvis.  There is no subcapsular hematoma in the left kidney.  Inflammatory changes are present in the pelvis and around the left ureter. Multiple tiny stones present in the distal left ureter extending up to the left ureter vesicle junction.  The right kidney appears within normal limits without calculi.  Colon and small bowel appear normal.  Hysterectomy.  Normal appendix. Lumbar spondylosis.  IMPRESSION: Postoperative changes of recent left lithotripsy. Expected inflammatory changes around the left kidney.  No subcapsular hematoma.  Residual  collecting system and renal pelvis calculi are present.  Multiple tiny fragmented stones are present in the distal ureter extending up to the left UVJ.  Original Report Authenticated By: Andreas Newport, M.D.   Dg Abd 1 View  01/29/2012  *RADIOLOGY REPORT*  Clinical Data: Pre lithotripsy.  Left renal calculus.  ABDOMEN - 1 VIEW  Comparison: 08/23/2011.  Findings: Left sided calculus is present measuring 13 mm long axis. This overlies the interpolar left renal shadow and left renal pelvis.  No definite right-sided renal calculi are identified.  No ureteral calculi.  The calcified phleboliths are present in the right anatomic pelvis.  IMPRESSION: 13 mm left renal calculus.  Original Report Authenticated By: Andreas Newport, M.D.    I personally reviewed the imaging tests through PACS system  I reviewed available ER/hospitalization records thought the EMR   1. Ureteral colic       MDM  5:11 AM The patient's pain is resolved at this time.  She has Percocet at home.  I've instructed her to call her urology office later today for further instructions.        Lyanne Co, MD 01/30/12 [phone]

## 2012-01-30 NOTE — ED Notes (Signed)
Pt. Is pain free at present. No complaints. Awaiting CT results. Resp even and unlabored. O2 sat 99% on RA. Family with pt.

## 2012-01-30 NOTE — ED Notes (Signed)
Pt had lithotripsy Monday for 9mm stone, pt now c/o severe pain

## 2012-01-30 NOTE — ED Notes (Signed)
Returned from CT.

## 2012-01-30 NOTE — ED Notes (Signed)
Patient transported to CT 

## 2012-01-30 NOTE — ED Notes (Signed)
MD at bedside. 

## 2012-02-01 ENCOUNTER — Encounter: Payer: Self-pay | Admitting: Family Medicine

## 2012-02-05 ENCOUNTER — Telehealth: Payer: Self-pay | Admitting: Family Medicine

## 2012-02-05 NOTE — Telephone Encounter (Signed)
Caller: [place]/Patient; Patient Name: [name]; PCP: Sheliah Hatch.; Best Callback Phone Number: [phone].  Pt. calling - relates she has kidney stone which she had lithotripsy on 01/29/12.  Constant pressure with urination.  Reports small amount blood in urine.  Urgency, pain with urination.  Advised see provider within 24 hours per Bloody Urine protocol.   She states she has appointment( with provider who did the Lithotripsy on 02/06/12).  Requests Rx. to [facility].  She refused appointment.  Note to provider for follow up  Per patient insistence "to get something for relief - antibiotic or something.Marland Kitchen"

## 2012-02-05 NOTE — Telephone Encounter (Signed)
Will need to call uro if she wants abx w/out being seen.  Would need appt for possible UTI to get abx from this office.

## 2012-02-05 NOTE — Telephone Encounter (Signed)
Discuss with patient, decline to schedule OV.

## 2012-02-06 DIAGNOSIS — R300 Dysuria: Secondary | ICD-10-CM | POA: Diagnosis not present

## 2012-02-06 DIAGNOSIS — N200 Calculus of kidney: Secondary | ICD-10-CM | POA: Diagnosis not present

## 2012-02-07 ENCOUNTER — Encounter: Payer: Self-pay | Admitting: Family Medicine

## 2012-02-07 ENCOUNTER — Ambulatory Visit (INDEPENDENT_AMBULATORY_CARE_PROVIDER_SITE_OTHER): Payer: Medicare Other | Admitting: Family Medicine

## 2012-02-07 VITALS — BP 127/82 | HR 89 | Temp 98.2°F | Ht 64.0 in | Wt 179.6 lb

## 2012-02-07 DIAGNOSIS — L259 Unspecified contact dermatitis, unspecified cause: Secondary | ICD-10-CM | POA: Diagnosis not present

## 2012-02-07 DIAGNOSIS — R21 Rash and other nonspecific skin eruption: Secondary | ICD-10-CM | POA: Insufficient documentation

## 2012-02-07 MED ORDER — TRIAMCINOLONE ACETONIDE 0.1 % EX OINT: TOPICAL_OINTMENT | Freq: Two times a day (BID) | CUTANEOUS | Status: DC

## 2012-02-07 MED ORDER — METHYLPREDNISOLONE ACETATE 80 MG/ML IJ SUSP
80.0000 mg | Freq: Once | INTRAMUSCULAR | Status: AC
  Administered 2012-02-07: 80 mg via INTRAMUSCULAR

## 2012-02-07 NOTE — Patient Instructions (Addendum)
This is something you came in contact with Start the Benadryl as needed for itching Use the Triamcinolone twice daily as needed for itching Wash all your clothes Hang in there!!!

## 2012-02-07 NOTE — Progress Notes (Signed)
  Subjective:    Patient ID: [name], female    DOB: 06-04-1941, 71 y.o.   MRN: [id number]  HPI Rash- started in scalp a few weeks ago.  Scalp is improving but now having red, itchy bumps on chest, abdomen.  Husband w/out similar sxs.  No recent travel.  No new or different meds.  On both sides of abd.  + fatigue.  Spares bra area and stops at waistband.   Review of Systems For ROS see HPI     Objective:   Physical Exam  Vitals reviewed. Constitutional: She appears well-developed and well-nourished. No distress.  Skin: Skin is warm and dry. Rash (maculopapular rash consistent w/ contact dermatitis) noted.          Assessment & Plan:

## 2012-02-07 NOTE — Assessment & Plan Note (Signed)
New.  Likely due to shirt washed in different detergent or shirt that was not washed prior to wearing due to distribution of rash.  Depo medrol given in office, pt to use benadryl and triamcinolone prn.  Pt declined oral steroids due to previous side effects.  Pt expressed understanding and is in agreement w/ plan.

## 2012-02-13 ENCOUNTER — Ambulatory Visit: Payer: Medicare Other | Admitting: Gastroenterology

## 2012-02-14 ENCOUNTER — Ambulatory Visit (INDEPENDENT_AMBULATORY_CARE_PROVIDER_SITE_OTHER): Payer: Medicare Other | Admitting: Family Medicine

## 2012-02-14 ENCOUNTER — Encounter: Payer: Self-pay | Admitting: Family Medicine

## 2012-02-14 VITALS — BP 123/76 | HR 90 | Temp 98.5°F | Ht 63.25 in | Wt 176.2 lb

## 2012-02-14 DIAGNOSIS — L259 Unspecified contact dermatitis, unspecified cause: Secondary | ICD-10-CM | POA: Diagnosis not present

## 2012-02-14 MED ORDER — HYDROXYZINE HCL 10 MG PO TABS: 10.0000 mg | ORAL_TABLET | Freq: Three times a day (TID) | ORAL | Status: AC | PRN

## 2012-02-14 MED ORDER — METHYLPREDNISOLONE ACETATE 80 MG/ML IJ SUSP
80.0000 mg | Freq: Once | INTRAMUSCULAR | Status: AC
  Administered 2012-02-14: 80 mg via INTRAMUSCULAR

## 2012-02-14 NOTE — Patient Instructions (Addendum)
We'll call you with your derm appt Hold the Melatonin for 1-2 weeks and see if your symptoms improve Use the Hydroxyzine as needed for itching Continue the steroid ointment Call with any questions or concerns Hang in there! Happy Labor Day!!!

## 2012-02-14 NOTE — Progress Notes (Signed)
  Subjective:    Patient ID: [name], female    DOB: 02-20-1941, 71 y.o.   MRN: [id number]  HPI Rash- improved from last visit but still itching.  Ran out of benadryl.  Has changed shampoos, lotion, has washed clothes.  Redness much improved but still diffuse across abdomen.  Pt is starting to wonder if melatonin tabs are cause of scalp and abd itch.  Has been using steroid ointment w/ some relief.  Still opposed to taking oral steroids.  Rash remains confined to abdomen and base of scalp.  No itching in groin, finger webbing, feet.  Pt would like to see derm.   Review of Systems For ROS see HPI     Objective:   Physical Exam  Vitals reviewed. Constitutional: She appears well-developed and well-nourished. No distress.  Skin: Skin is warm and dry. Rash noted. There is erythema (faint erythematous maculopapular rash consistent w/ contact dermatitis).          Assessment & Plan:

## 2012-02-14 NOTE — Assessment & Plan Note (Signed)
Unchanged.  i feel rash has improved but pt reports it remains 'terribly itchy'.  Will start atarax prn.  Refer to new derm as she was unhappy w/ last one.  Repeat depo medrol injxn as pt still refusing oral steroids.  Will follow.

## 2012-02-20 ENCOUNTER — Encounter: Payer: Self-pay | Admitting: Family Medicine

## 2012-02-20 NOTE — Telephone Encounter (Signed)
Please advise 

## 2012-02-21 NOTE — Telephone Encounter (Signed)
MD Beverely Low made aware of pt apt for today, no further instructions given.

## 2012-02-22 ENCOUNTER — Other Ambulatory Visit: Payer: Self-pay | Admitting: Dermatology

## 2012-02-22 DIAGNOSIS — L259 Unspecified contact dermatitis, unspecified cause: Secondary | ICD-10-CM | POA: Diagnosis not present

## 2012-02-26 ENCOUNTER — Other Ambulatory Visit: Payer: Self-pay | Admitting: Family Medicine

## 2012-02-26 MED ORDER — FENOFIBRATE 145 MG PO TABS: 145.0000 mg | ORAL_TABLET | Freq: Every day | ORAL | Status: DC

## 2012-02-26 NOTE — Telephone Encounter (Signed)
Refill TRICOR 145 MG Take 145 mg by mouth daily after breakfast--last fill 8.7.13 no qty listed  Last ov 8.28.13 acute

## 2012-02-26 NOTE — Telephone Encounter (Signed)
rx sent to pharmacy by e-script  

## 2012-03-06 ENCOUNTER — Ambulatory Visit (INDEPENDENT_AMBULATORY_CARE_PROVIDER_SITE_OTHER): Payer: Medicare Other | Admitting: Family Medicine

## 2012-03-06 ENCOUNTER — Encounter: Payer: Self-pay | Admitting: Family Medicine

## 2012-03-06 VITALS — BP 128/75 | HR 92 | Temp 98.2°F | Ht 63.25 in | Wt 174.2 lb

## 2012-03-06 DIAGNOSIS — L259 Unspecified contact dermatitis, unspecified cause: Secondary | ICD-10-CM

## 2012-03-06 DIAGNOSIS — R110 Nausea: Secondary | ICD-10-CM | POA: Diagnosis not present

## 2012-03-06 DIAGNOSIS — E119 Type 2 diabetes mellitus without complications: Secondary | ICD-10-CM

## 2012-03-06 LAB — TSH: TSH: 1.08 u[IU]/mL (ref 0.35–5.50)

## 2012-03-06 LAB — CBC WITH DIFFERENTIAL/PLATELET
Basophils Absolute: 0 10*3/uL (ref 0.0–0.1)
Lymphocytes Relative: 19.3 % (ref 12.0–46.0)
Monocytes Relative: 5.1 % (ref 3.0–12.0)
Neutrophils Relative %: 71.7 % (ref 43.0–77.0)
Platelets: 337 10*3/uL (ref 150.0–400.0)
RDW: 15.4 % — ABNORMAL HIGH (ref 11.5–14.6)

## 2012-03-06 LAB — BASIC METABOLIC PANEL
CO2: 27 mEq/L (ref 19–32)
Calcium: 10 mg/dL (ref 8.4–10.5)
Chloride: 103 mEq/L (ref 96–112)
Potassium: 3.5 mEq/L (ref 3.5–5.1)
Sodium: 140 mEq/L (ref 135–145)

## 2012-03-06 LAB — HEPATIC FUNCTION PANEL
ALT: 19 U/L (ref 0–35)
AST: 21 U/L (ref 0–37)
Bilirubin, Direct: 0 mg/dL (ref 0.0–0.3)
Total Bilirubin: 0.5 mg/dL (ref 0.3–1.2)

## 2012-03-06 MED ORDER — CICLOPIROX 1 % EX SHAM: 1.0000 "application " | MEDICATED_SHAMPOO | Freq: Every day | CUTANEOUS | Status: DC

## 2012-03-06 NOTE — Progress Notes (Signed)
  Subjective:    Patient ID: [name], female    DOB: May 17, 1941, 71 y.o.   MRN: [id number]  HPI Rash- saw derm, results of multiple biopsies were inconclusive.  Using Clobetasol cream BID w/ some relief. But rash is spreading under arms and head is still itching.  Has not used prescription shampoo for scalp itching.  Has changed everything but toothpaste (has been using x20 yrs) and dove soap (using since age 6)  Nausea- having food aversion (currently sweets), nausea will worsen will certain movements and 'it gets really bad when I get hot'.  Will get a little lightheaded w/ rapid movement.  Had episode on boat recently that she got so hot and nauseated she had to be brought back to shore- 'no one else was hot'.  Craving fluids.   Review of Systems For ROS see HPI     Objective:   Physical Exam  Vitals reviewed. Constitutional: She is oriented to person, place, and time. She appears well-developed and well-nourished. No distress.  HENT:  Head: Normocephalic and atraumatic.  Eyes: Conjunctivae normal and EOM are normal. Pupils are equal, round, and reactive to light.  Neck: Normal range of motion. Neck supple. No thyromegaly present.  Cardiovascular: Normal rate, regular rhythm and normal heart sounds.   Pulmonary/Chest: Effort normal and breath sounds normal. No respiratory distress. She has no wheezes. She has no rales.  Abdominal: Soft. Bowel sounds are normal. She exhibits no distension. There is no tenderness. There is no rebound and no guarding.  Musculoskeletal: She exhibits no edema.  Lymphadenopathy:    She has no cervical adenopathy.  Neurological: She is alert and oriented to person, place, and time. No cranial nerve deficit. Coordination normal.  Skin: Skin is warm and dry. Rash (now very faint, much improved from previous, bx sites on abd x4 are well healing) noted.          Assessment & Plan:

## 2012-03-06 NOTE — Assessment & Plan Note (Signed)
Chronic problem.  Long overdue on A1C.  Pt's nausea, hot flashes, and fatigue may be due to low CBGs.  Encouraged pt to check sugars when feeling poorly.  Will adjust meds based on A1C results.  Will need more regular f/u.  Pt expressed understanding and is in agreement w/ plan.

## 2012-03-06 NOTE — Assessment & Plan Note (Signed)
Improving but continues to have severely itchy scalp.  Will start ciclopirox shampoo and see if pt has relief.

## 2012-03-06 NOTE — Assessment & Plan Note (Signed)
Recurrent problem.  Pt's current sxs sound consistent w/ either vagal response or hypoglycemia.  Will check labs to r/o infxn, biliary abnormality, electrolyte disturbance, thyroid problem.  Encouraged her to check CBGs when sxs occur.  Will follow closely.

## 2012-03-06 NOTE — Patient Instructions (Addendum)
We'll notify you of your lab results and make any changes if needed Use the shampoo daily for the itchy scalp- decrease to 2-3x/week once itching improves Make sure you are eating regularly Drink plenty of fluids Change positions slowly Call with any questions or concerns Hang in there!!!

## 2012-03-11 ENCOUNTER — Telehealth: Payer: Self-pay | Admitting: Family Medicine

## 2012-03-11 MED ORDER — PIOGLITAZONE HCL-METFORMIN HCL 15-850 MG PO TABS: 1.0000 | ORAL_TABLET | Freq: Every day | ORAL | Status: DC

## 2012-03-11 NOTE — Telephone Encounter (Signed)
ACTOPLUS MET 15-85 TAKE ONE TABLET EACH DAY

## 2012-03-19 DIAGNOSIS — H2510 Age-related nuclear cataract, unspecified eye: Secondary | ICD-10-CM | POA: Diagnosis not present

## 2012-03-25 ENCOUNTER — Ambulatory Visit (INDEPENDENT_AMBULATORY_CARE_PROVIDER_SITE_OTHER): Payer: Medicare Other | Admitting: Family Medicine

## 2012-03-25 ENCOUNTER — Encounter: Payer: Self-pay | Admitting: Family Medicine

## 2012-03-25 VITALS — BP 127/79 | HR 81 | Temp 98.5°F | Ht 63.25 in | Wt 175.6 lb

## 2012-03-25 DIAGNOSIS — L259 Unspecified contact dermatitis, unspecified cause: Secondary | ICD-10-CM | POA: Diagnosis not present

## 2012-03-25 MED ORDER — PERMETHRIN 5 % EX CREA: TOPICAL_CREAM | Freq: Once | CUTANEOUS | Status: DC

## 2012-03-25 NOTE — Patient Instructions (Addendum)
Apply the ointment from the jawline down, sleep in it, then wash it off Wash all sheets and towels If symptoms persist after 7 days, use the 2nd half of the tube If this doesn't do the trick, we'll send you to [facility]  Scabies Scabies are small bugs (mites) that burrow under the skin and cause red bumps and severe itching. These bugs can only be seen with a microscope. Scabies are highly contagious. They can spread easily from person to person by direct contact. They are also spread through sharing clothing or linens that have the scabies mites living in them. It is not unusual for an entire family to become infected through shared towels, clothing, or bedding.  HOME CARE INSTRUCTIONS   Your caregiver may prescribe a cream or lotion to kill the mites. If cream is prescribed, massage the cream into the entire body from the neck to the bottom of both feet. Also massage the cream into the scalp and face if your child is less than 40 year old. Avoid the eyes and mouth. Do not wash your hands after application.  Leave the cream on for 8 to 12 hours. Your child should bathe or shower after the 8 to 12 hour application period. Sometimes it is helpful to apply the cream to your child right before bedtime.  One treatment is usually effective and will eliminate approximately 95% of infestations. For severe cases, your caregiver may decide to repeat the treatment in 1 week. Everyone in your household should be treated with one application of the cream.  New rashes or burrows should not appear within 24 to 48 hours after successful treatment. However, the itching and rash may last for 2 to 4 weeks after successful treatment. Your caregiver may prescribe a medicine to help with the itching or to help the rash go away more quickly.  Scabies can live on clothing or linens for up to 3 days. All of your child's recently used clothing, towels, stuffed toys, and bed linens should be washed in hot water and then dried  in a dryer for at least 20 minutes on high heat. Items that cannot be washed should be enclosed in a plastic bag for at least 3 days.  To help relieve itching, bathe your child in a cool bath or apply cool washcloths to the affected areas.  Your child may return to school after treatment with the prescribed cream. SEEK MEDICAL CARE IF:   The itching persists longer than 4 weeks after treatment.  The rash spreads or becomes infected. Signs of infection include red blisters or yellow-tan crust. Document Released: 06/05/2005 Document Revised: 08/28/2011 Document Reviewed: 10/14/2008 [facility] Patient Information 2013 [place], [place].

## 2012-03-25 NOTE — Progress Notes (Signed)
  Subjective:    Patient ID: [name], female    DOB: 06/25/1940, 71 y.o.   MRN: [id number]  HPI Skin rash- trunk, back, now spreading to arms.  Saw derm x2- was found to be perivasicular inflammation w/ eosinophils, drug rxn vs urticaria vs bites.  sxs started initially the end of August.  No longer on Flomax.  On Effexor, Actoplus Met, Tricor, and decongestant for allergies.  Did trial off both Tricor and Actoplus met to see if sxs improved and they didn't.  Is using Ciclopirox shampoo for itching but no relief.  Itching is causing pt to wake up at night.     Review of Systems For ROS see HPI     Objective:   Physical Exam  Vitals reviewed. Constitutional: She appears well-developed and well-nourished. No distress.  Skin: Skin is warm and dry. Rash (faint, linear appearing red bumps on trunk, upper arms, groin.  + excoriations) noted.          Assessment & Plan:

## 2012-03-25 NOTE — Assessment & Plan Note (Signed)
Pt's rash is not responding to traditional tx of contact dermatitis despite steroid injxns, creams, lotions, etc.  Given itch out of proportion to exam will attempt to treat as scabies ([place] w/ Elimite.  If no improvement w/ this, will refer to [facility] for additional opinion.  Pt expressed understanding and is in agreement w/ plan.

## 2012-03-27 ENCOUNTER — Telehealth: Payer: Self-pay | Admitting: Family Medicine

## 2012-03-27 NOTE — Telephone Encounter (Signed)
Called pt to advise per MD Tabori that the itching will get worse before and can take up to 7 days for this to get better and please do not repeat the cream until 7 days have passed, pt understood and will not use the cream until the 7 days have passed

## 2012-03-27 NOTE — Telephone Encounter (Signed)
Pt called and is still really bad with itching from the bugs. She needs to talk with someone about the med she has and what to do. She says she has enough for one more coat this evening.

## 2012-04-01 ENCOUNTER — Telehealth: Payer: Self-pay | Admitting: *Deleted

## 2012-04-01 ENCOUNTER — Ambulatory Visit: Payer: Medicare Other | Admitting: Family Medicine

## 2012-04-01 DIAGNOSIS — B86 Scabies: Secondary | ICD-10-CM

## 2012-04-01 MED ORDER — HYDROXYZINE HCL 50 MG PO TABS: 50.0000 mg | ORAL_TABLET | ORAL | Status: DC | PRN

## 2012-04-01 NOTE — Telephone Encounter (Signed)
Pt notes she is broken out from head to her toes and notes holes in her skin, feels that it is worse, taking sleeping pills but cant sleep per the itching, does note her head does feel better, notes worsening everyday, now notes tiny scabs and little holes, MD Tabori advised that she use the second dose of the cream tonight and she will call her in a itch pills and set her up to see a specialist with [facility], pt understood and agreed to the new medication and referral placed in chart for [facility], pt aware someone will call her with the apt, pt advised that she has been taking hot showers, advised this can dry the skin out more and cause more itching, pt understood all instructions

## 2012-04-03 ENCOUNTER — Telehealth: Payer: Self-pay | Admitting: Family Medicine

## 2012-04-03 MED ORDER — CLOBETASOL PROPIONATE 0.05 % EX FOAM: 1.0000 "application " | Freq: Two times a day (BID) | CUTANEOUS | Status: DC

## 2012-04-03 NOTE — Telephone Encounter (Signed)
Ok to give largest quantity available of Clobetasol Yes, the mites may exit through the hands, particularly the wrists and webbing between the fingers.  This is normal and expected.  There is nothing that needs to be done at this time.

## 2012-04-03 NOTE — Telephone Encounter (Signed)
2-issues today  1-wants Dr.Tabori to send a new RX for a medication prescribed by Dr.Lupkin--Spoke wt/tabori verbally and she said she would if I could get the name for it per pt is  CLOBETASOL PROPIONATE FOAM .05% -- NOTE she wants the largest tube she can get called into [facility] [phone]  2-Pt made a mistake after medicating and washed her hands and is having some issues. Feels like they are coming out thru her hands needs medical advise please call at [phone]

## 2012-04-03 NOTE — Telephone Encounter (Signed)
Spoke to pt to advise results/instructions. Pt understood. Sent Rx to pharmacy per pt requests, notes plans to place cream on her face and hands tonight per forgot last night during the overall treatment for her body, MD Beverely Low advised okay to use that treatment in the area

## 2012-04-03 NOTE — Telephone Encounter (Signed)
Please advise 

## 2012-04-04 DIAGNOSIS — R21 Rash and other nonspecific skin eruption: Secondary | ICD-10-CM | POA: Diagnosis not present

## 2012-04-20 ENCOUNTER — Emergency Department (HOSPITAL_BASED_OUTPATIENT_CLINIC_OR_DEPARTMENT_OTHER): Payer: Medicare Other

## 2012-04-20 ENCOUNTER — Encounter (HOSPITAL_BASED_OUTPATIENT_CLINIC_OR_DEPARTMENT_OTHER): Payer: Self-pay

## 2012-04-20 ENCOUNTER — Emergency Department (HOSPITAL_BASED_OUTPATIENT_CLINIC_OR_DEPARTMENT_OTHER)
Admission: EM | Admit: 2012-04-20 | Discharge: 2012-04-20 | Disposition: A | Discharge status: Home / Self Care | Payer: Medicare Other | Attending: Emergency Medicine | Admitting: Emergency Medicine

## 2012-04-20 DIAGNOSIS — Z23 Encounter for immunization: Secondary | ICD-10-CM | POA: Insufficient documentation

## 2012-04-20 DIAGNOSIS — IMO0002 Reserved for concepts with insufficient information to code with codable children: Secondary | ICD-10-CM | POA: Insufficient documentation

## 2012-04-20 DIAGNOSIS — T148XXA Other injury of unspecified body region, initial encounter: Secondary | ICD-10-CM

## 2012-04-20 DIAGNOSIS — Z79899 Other long term (current) drug therapy: Secondary | ICD-10-CM | POA: Insufficient documentation

## 2012-04-20 DIAGNOSIS — S022XXA Fracture of nasal bones, initial encounter for closed fracture: Secondary | ICD-10-CM | POA: Diagnosis not present

## 2012-04-20 DIAGNOSIS — Z91018 Allergy to other foods: Secondary | ICD-10-CM | POA: Insufficient documentation

## 2012-04-20 DIAGNOSIS — S298XXA Other specified injuries of thorax, initial encounter: Secondary | ICD-10-CM | POA: Diagnosis not present

## 2012-04-20 DIAGNOSIS — S199XXA Unspecified injury of neck, initial encounter: Secondary | ICD-10-CM | POA: Diagnosis not present

## 2012-04-20 DIAGNOSIS — G43909 Migraine, unspecified, not intractable, without status migrainosus: Secondary | ICD-10-CM | POA: Insufficient documentation

## 2012-04-20 DIAGNOSIS — Y93E8 Activity, other personal hygiene: Secondary | ICD-10-CM | POA: Insufficient documentation

## 2012-04-20 DIAGNOSIS — E785 Hyperlipidemia, unspecified: Secondary | ICD-10-CM | POA: Insufficient documentation

## 2012-04-20 DIAGNOSIS — W1789XA Other fall from one level to another, initial encounter: Secondary | ICD-10-CM | POA: Insufficient documentation

## 2012-04-20 DIAGNOSIS — S0990XA Unspecified injury of head, initial encounter: Secondary | ICD-10-CM | POA: Diagnosis not present

## 2012-04-20 DIAGNOSIS — Z87442 Personal history of urinary calculi: Secondary | ICD-10-CM | POA: Insufficient documentation

## 2012-04-20 DIAGNOSIS — W19XXXA Unspecified fall, initial encounter: Secondary | ICD-10-CM

## 2012-04-20 DIAGNOSIS — J45909 Unspecified asthma, uncomplicated: Secondary | ICD-10-CM | POA: Insufficient documentation

## 2012-04-20 DIAGNOSIS — F1010 Alcohol abuse, uncomplicated: Secondary | ICD-10-CM | POA: Insufficient documentation

## 2012-04-20 DIAGNOSIS — Z87891 Personal history of nicotine dependence: Secondary | ICD-10-CM | POA: Insufficient documentation

## 2012-04-20 DIAGNOSIS — E119 Type 2 diabetes mellitus without complications: Secondary | ICD-10-CM | POA: Insufficient documentation

## 2012-04-20 DIAGNOSIS — Y92009 Unspecified place in unspecified non-institutional (private) residence as the place of occurrence of the external cause: Secondary | ICD-10-CM | POA: Insufficient documentation

## 2012-04-20 MED ORDER — CEPHALEXIN 500 MG PO CAPS: 500.0000 mg | ORAL_CAPSULE | Freq: Four times a day (QID) | ORAL | Status: DC

## 2012-04-20 MED ORDER — TETANUS-DIPHTH-ACELL PERTUSSIS 5-2.5-18.5 LF-MCG/0.5 IM SUSP
0.5000 mL | Freq: Once | INTRAMUSCULAR | Status: AC
  Administered 2012-04-20: 0.5 mL via INTRAMUSCULAR
  Filled 2012-04-20: qty 0.5

## 2012-04-20 NOTE — ED Notes (Signed)
Pt got up to use bathroom, husband heard scream, pt was found on floor with blood on face, bruise to nasal bridge, [facility] called, ekg en route nsr, vs 152/78, cbg 123, hr 92, pt intoxicated, reports drinking vodka night before

## 2012-04-20 NOTE — ED Provider Notes (Signed)
History     CSN: [id number]  Arrival date & time 04/20/12  4098   First MD Initiated Contact with Patient 04/20/12 [phone]      Chief Complaint  Patient presents with  . Alcohol Intoxication    (Consider location/radiation/quality/duration/timing/severity/associated sxs/prior treatment) Patient is a 71 y.o. female presenting with intoxication and fall. The history is provided by the patient. No language interpreter was used.  Alcohol Intoxication This is a new problem. The current episode started 1 to 2 hours ago. The problem occurs constantly. The problem has not changed since onset.Pertinent negatives include no chest pain, no abdominal pain, no headaches and no shortness of breath. Nothing aggravates the symptoms.  Fall She fell from a height of 1 to 2 ft. She landed on a hard floor. The volume of blood lost was minimal. The point of impact was the head. The pain is present in the head. The pain is mild. Pertinent negatives include no fever, no numbness, no abdominal pain, no nausea and no headaches. Treatment on scene includes a c-collar and a backboard. She has tried nothing for the symptoms. The treatment provided no relief.    Past Medical History  Diagnosis Date  . Allergic rhinitis   . Diabetes mellitus   . Urinary tract infection   . Migraines   . Palpitations   . Asthma   . Hyperlipemia   . Kidney stone     Past Surgical History  Procedure Date  . Rotator cuff repair     right  . Tonsillectomy   . Abdominal hysterectomy     Has ovaries  . Deviated septum repair   . Lithotripsy     Family History  Problem Relation Age of Onset  . Diabetes Father   . Colon cancer Neg Hx   . Esophageal cancer Neg Hx   . Rectal cancer Neg Hx   . Stomach cancer Neg Hx   . Breast cancer Neg Hx     History  Substance Use Topics  . Smoking status: Former Smoker    Types: Cigarettes  . Smokeless tobacco: Never Used   Comment: smoked in college years ago  . Alcohol Use: 0.0  oz/week     1-2 glasses a night variety beer    OB History    Grav Para Term Preterm Abortions TAB SAB Ect Mult Living   2 2              Review of Systems  Constitutional: Negative for fever.  Respiratory: Negative for chest tightness and shortness of breath.   Cardiovascular: Negative for chest pain and palpitations.  Gastrointestinal: Negative for nausea, abdominal pain and diarrhea.  Genitourinary: Negative for dysuria.  Neurological: Negative for dizziness, weakness, numbness and headaches.  Hematological: Negative for adenopathy.  All other systems reviewed and are negative.    Allergies  Papain; Papaya derivatives; Mushroom ext cmplx(shiitake-reishi-mait); and Venlafaxine  Home Medications   Current Outpatient Rx  Name Route Sig Dispense Refill  . B COMPLEX VITAMINS PO CAPS Oral Take 1 capsule by mouth daily.     Marland Kitchen BIMATOPROST 0.03 % OP SOLN Left Eye Place 1 drop into the left eye at bedtime.     . BUDESONIDE ER 3 MG PO CP24      . CALCIUM CARB-CHOLECALCIFEROL 600-800 MG-UNIT PO TABS Oral Take 1 tablet by mouth 2 (two) times daily.    Marland Kitchen CETIRIZINE HCL 10 MG PO TABS Oral Take 10 mg by mouth daily.    Marland Kitchen  CICLOPIROX 1 % EX SHAM Topical Apply 1 application topically at bedtime. 120 mL 3  . CLOBETASOL PROPIONATE 0.05 % EX FOAM Topical Apply 1 application topically 2 (two) times daily. 100 g 1  . FENOFIBRATE 145 MG PO TABS Oral Take 1 tablet (145 mg total) by mouth daily after breakfast. 30 tablet 5  . HYDROXYZINE HCL 50 MG PO TABS Oral Take 1 tablet (50 mg total) by mouth every 4 (four) hours as needed for itching. 60 tablet 0  . MELATONIN 10 MG PO CAPS Oral Take 1 capsule by mouth.    Marland Kitchen OVER THE COUNTER MEDICATION Oral Take 1 tablet by mouth 2 (two) times daily. Glucosamine 1500mg  chondroitin 1200mg     . OXYCODONE-ACETAMINOPHEN 5-500 MG PO CAPS      . PERMETHRIN 5 % EX CREA Topical Apply topically once. Repeat in 7 days if symptoms persist 60 g 0  . PIOGLITAZONE  HCL-METFORMIN HCL 15-850 MG PO TABS Oral Take 1 tablet by mouth daily after breakfast. 30 tablet 3  . SULFAMETHOXAZOLE-TMP DS 800-160 MG PO TABS      . TAMSULOSIN HCL 0.4 MG PO CAPS      . TRIAMCINOLONE ACETONIDE 0.1 % EX OINT      . VENLAFAXINE HCL ER 75 MG PO CP24 Oral Take 75 mg by mouth daily after breakfast.    . VITAMIN D (CHOLECALCIFEROL) PO Oral Take 1,000 Units by mouth daily.       BP 145/76  Pulse 83  Temp 97.4 F (36.3 C) (Oral)  Resp 18  Ht 5\' 3"  (1.6 m)  Wt 170 lb (77.111 kg)  BMI 30.11 kg/m2  SpO2 96%  Physical Exam  Constitutional: She is oriented to person, place, and time. She appears well-developed and well-nourished.  HENT:  Head: Macrocephalic.    Right Ear: No mastoid tenderness. No hemotympanum.  Left Ear: No mastoid tenderness. No hemotympanum.  Mouth/Throat: Oropharynx is clear and moist.  Eyes: Conjunctivae normal and EOM are normal. Pupils are equal, round, and reactive to light.  Neck: No tracheal deviation present.       No c spine tenderness  Cardiovascular: Normal rate and regular rhythm.   Pulmonary/Chest: Effort normal and breath sounds normal. She has no wheezes. She has no rales.  Abdominal: Soft. Bowel sounds are normal. There is no tenderness. There is no rebound.  Musculoskeletal: She exhibits no tenderness.       No snuff box tenderness of either wrist, negative anterior and posterior drawer tests of B knees  Neurological: She is alert and oriented to person, place, and time. She has normal reflexes.  Skin: Skin is warm and dry.  Psychiatric: She has a normal mood and affect.    ED Course  Procedures (including critical care time)  Labs Reviewed - No data to display Dg Chest 2 View  04/20/2012  *RADIOLOGY REPORT*  Clinical Data: Fall.  CHEST - 2 VIEW  Comparison: 01/03/2011  Findings: The heart size and pulmonary vascularity are normal. The lungs appear clear and expanded without focal air space disease or consolidation. No blunting  of the costophrenic angles.  No pneumothorax.  Mediastinal contours appear intact.  Postoperative changes in the right shoulder.  Degenerative changes in the spine. No significant change since previous study.  IMPRESSION: No evidence of active pulmonary disease.   Original Report Authenticated By: Burman Nieves, M.D.    Dg Pelvis 1-2 Views  04/20/2012  *RADIOLOGY REPORT*  Clinical Data: Fall.  PELVIS - 1-2 VIEW  Comparison: 02/06/2012  Findings: Degenerative changes in the lower lumbar spine and hips. No displaced fractures identified.  Bone cortex and trabecular architecture appear intact.  No focal bone lesion or bone destruction.  SI joints and symphysis pubis are not displaced.  IMPRESSION: Degenerative changes in the lower lumbar spine and hips.  No displaced fractures identified.   Original Report Authenticated By: Burman Nieves, M.D.    Ct Head Wo Contrast  04/20/2012  *RADIOLOGY REPORT*  Clinical Data:  Intoxicated patient after fall was found on floor with blood on face and bruised to the nose.  CT HEAD WITHOUT CONTRAST CT MAXILLOFACIAL WITHOUT CONTRAST CT CERVICAL SPINE WITHOUT CONTRAST  Technique:  Multidetector CT imaging of the head, cervical spine, and maxillofacial structures were performed using the standard protocol without intravenous contrast. Multiplanar CT image reconstructions of the cervical spine and maxillofacial structures were also generated.  Comparison:   None  CT HEAD  Findings: Mild cerebral atrophy.  Low attenuation changes in the deep white matter consistent with small vessel ischemia.  No ventricular dilatation.  No mass effect or midline shift.  No abnormal extra-axial fluid collections.  Gray-white matter junctions are distinct.  Basal cisterns are not effaced.  No evidence of acute intracranial hemorrhage.  No depressed skull fractures.  Mastoid air cells are not opacified.  IMPRESSION: No acute intracranial abnormalities.  CT MAXILLOFACIAL  Findings:  Comminuted nasal  bone fractures displaced towards the left.  Fractures of the superior nasal septum.  The globes and extraocular muscles appear intact and symmetrical.  Small air-fluid level in the right maxillary antrum.  No displaced maxillary antral fractures are appreciated.  The orbital rims, maxillary antral walls, pterygoid plates, zygomatic arches, temporomandibular joints, maxilla, and mandibles appear intact.  IMPRESSION: Displaced fractures of the nasal bones.  Nasal septal fracture superiorly.  Acute appearing air-fluid level in the right maxillary antrum.  No additional displaced fractures are identified.  CT CERVICAL SPINE  Findings:   There is reversal of the usual cervical lordosis.  This may be due to patient positioning but muscle spasm or ligamentous injury can also have this appearance.  Degenerative changes throughout the cervical spine with narrowed cervical interspaces and associated endplate hypertrophic changes.  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  Degenerative changes in the facet joints.  No vertebral compression deformities.  No prevertebral soft tissue swelling.  Lateral masses of C1 appear symmetrical.  The odontoid process appears intact.  Degenerative changes at C1-2.  No focal bone lesion or bone destruction is otherwise appreciated.  Bone cortex and trabecular architecture appear otherwise intact.  IMPRESSION: Straightening of the usual cervical lordosis is likely due to patient positioning but ligamentous injury or muscle spasm can also have this appearance.  Diffuse degenerative changes.  No displaced fractures identified.   Original Report Authenticated By: Burman Nieves, M.D.    Ct Cervical Spine Wo Contrast  04/20/2012  *RADIOLOGY REPORT*  Clinical Data:  Intoxicated patient after fall was found on floor with blood on face and bruised to the nose.  CT HEAD WITHOUT CONTRAST CT MAXILLOFACIAL WITHOUT CONTRAST CT CERVICAL SPINE WITHOUT CONTRAST  Technique:   Multidetector CT imaging of the head, cervical spine, and maxillofacial structures were performed using the standard protocol without intravenous contrast. Multiplanar CT image reconstructions of the cervical spine and maxillofacial structures were also generated.  Comparison:   None  CT HEAD  Findings: Mild cerebral atrophy.  Low attenuation changes in the deep white matter consistent with small vessel  ischemia.  No ventricular dilatation.  No mass effect or midline shift.  No abnormal extra-axial fluid collections.  Gray-white matter junctions are distinct.  Basal cisterns are not effaced.  No evidence of acute intracranial hemorrhage.  No depressed skull fractures.  Mastoid air cells are not opacified.  IMPRESSION: No acute intracranial abnormalities.  CT MAXILLOFACIAL  Findings:  Comminuted nasal bone fractures displaced towards the left.  Fractures of the superior nasal septum.  The globes and extraocular muscles appear intact and symmetrical.  Small air-fluid level in the right maxillary antrum.  No displaced maxillary antral fractures are appreciated.  The orbital rims, maxillary antral walls, pterygoid plates, zygomatic arches, temporomandibular joints, maxilla, and mandibles appear intact.  IMPRESSION: Displaced fractures of the nasal bones.  Nasal septal fracture superiorly.  Acute appearing air-fluid level in the right maxillary antrum.  No additional displaced fractures are identified.  CT CERVICAL SPINE  Findings:   There is reversal of the usual cervical lordosis.  This may be due to patient positioning but muscle spasm or ligamentous injury can also have this appearance.  Degenerative changes throughout the cervical spine with narrowed cervical interspaces and associated endplate hypertrophic changes.  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  Degenerative changes in the facet joints.  No vertebral compression deformities.  No prevertebral soft tissue swelling.  Lateral masses of C1  appear symmetrical.  The odontoid process appears intact.  Degenerative changes at C1-2.  No focal bone lesion or bone destruction is otherwise appreciated.  Bone cortex and trabecular architecture appear otherwise intact.  IMPRESSION: Straightening of the usual cervical lordosis is likely due to patient positioning but ligamentous injury or muscle spasm can also have this appearance.  Diffuse degenerative changes.  No displaced fractures identified.   Original Report Authenticated By: Burman Nieves, M.D.    Ct Maxillofacial Wo Cm  04/20/2012  *RADIOLOGY REPORT*  Clinical Data:  Intoxicated patient after fall was found on floor with blood on face and bruised to the nose.  CT HEAD WITHOUT CONTRAST CT MAXILLOFACIAL WITHOUT CONTRAST CT CERVICAL SPINE WITHOUT CONTRAST  Technique:  Multidetector CT imaging of the head, cervical spine, and maxillofacial structures were performed using the standard protocol without intravenous contrast. Multiplanar CT image reconstructions of the cervical spine and maxillofacial structures were also generated.  Comparison:   None  CT HEAD  Findings: Mild cerebral atrophy.  Low attenuation changes in the deep white matter consistent with small vessel ischemia.  No ventricular dilatation.  No mass effect or midline shift.  No abnormal extra-axial fluid collections.  Gray-white matter junctions are distinct.  Basal cisterns are not effaced.  No evidence of acute intracranial hemorrhage.  No depressed skull fractures.  Mastoid air cells are not opacified.  IMPRESSION: No acute intracranial abnormalities.  CT MAXILLOFACIAL  Findings:  Comminuted nasal bone fractures displaced towards the left.  Fractures of the superior nasal septum.  The globes and extraocular muscles appear intact and symmetrical.  Small air-fluid level in the right maxillary antrum.  No displaced maxillary antral fractures are appreciated.  The orbital rims, maxillary antral walls, pterygoid plates, zygomatic arches,  temporomandibular joints, maxilla, and mandibles appear intact.  IMPRESSION: Displaced fractures of the nasal bones.  Nasal septal fracture superiorly.  Acute appearing air-fluid level in the right maxillary antrum.  No additional displaced fractures are identified.  CT CERVICAL SPINE  Findings:   There is reversal of the usual cervical lordosis.  This may be due to patient positioning but muscle spasm  or ligamentous injury can also have this appearance.  Degenerative changes throughout the cervical spine with narrowed cervical interspaces and associated endplate hypertrophic changes.  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  Degenerative changes in the facet joints.  No vertebral compression deformities.  No prevertebral soft tissue swelling.  Lateral masses of C1 appear symmetrical.  The odontoid process appears intact.  Degenerative changes at C1-2.  No focal bone lesion or bone destruction is otherwise appreciated.  Bone cortex and trabecular architecture appear otherwise intact.  IMPRESSION: Straightening of the usual cervical lordosis is likely due to patient positioning but ligamentous injury or muscle spasm can also have this appearance.  Diffuse degenerative changes.  No displaced fractures identified.   Original Report Authenticated By: Burman Nieves, M.D.      No diagnosis found.    MDM  No C spine tenderness.  Will update tetanus and patient will follow up with maxillofacial in 7 days regarding broken nose.  Follow up with ENT in 7 days       Haelee Bolen Smitty Cords, MD 04/20/12 1610

## 2012-04-22 ENCOUNTER — Telehealth: Payer: Self-pay | Admitting: Family Medicine

## 2012-04-22 NOTE — Telephone Encounter (Signed)
Pt notified, Dr. Jarold Motto will have to do the testing.

## 2012-04-22 NOTE — Telephone Encounter (Signed)
Dr Jarold Motto would do this testing

## 2012-04-22 NOTE — Telephone Encounter (Signed)
Patient would like to know if she can come here for celiac test or if she needs referral to Dr. Jarold Motto. She states she has been doing research and thinks she has Celiac disease.

## 2012-04-24 DIAGNOSIS — S022XXA Fracture of nasal bones, initial encounter for closed fracture: Secondary | ICD-10-CM | POA: Diagnosis not present

## 2012-04-24 DIAGNOSIS — S060X9A Concussion with loss of consciousness of unspecified duration, initial encounter: Secondary | ICD-10-CM | POA: Diagnosis not present

## 2012-04-25 ENCOUNTER — Encounter (HOSPITAL_BASED_OUTPATIENT_CLINIC_OR_DEPARTMENT_OTHER): Payer: Self-pay | Admitting: *Deleted

## 2012-04-25 NOTE — Progress Notes (Signed)
Pt says she cannot come in for labs Will have to do preop Needs istat and ekg To bring meds She is somewhat hyper

## 2012-04-26 NOTE — H&P (Signed)
[name], [name] 71 y.o., female [id number]     Chief Complaint:  Closed Displaced Nasal Fracture  HPI: Patient is a 71 year old female who presents for nasal trauma. She reports going to the bathroom very early one morning, 4.5 days ago. She states that she was "still asleep" and fell with her face hitting the tile floor. She also hit her right knee, wrist and shoulder. For about 20 minutes, her husband states that her mental status was not normal. Patient does not recall the injury very well. She feels more lightheaded than usual and has been more tired with headaches since the accident. Vision seems to be blurred with reading. No palpitations. Blood sugars are reportedly under good control.  She had significant swelling under the eyes and nose. Also chipped a tooth. Facial swelling has come down significantly. Nasal breathing is reported as normal. She feels the nose is wider than it should be.  She is due to see her dentist next week. Maxillofacial CT showed displaced fractures of the nasal bones. Nasal septal fracture superiorly. Acute appearing air-fluid level in the right maxillary antrum. CT head showed no acute intracranial abnormalities. CT cervical spine showed no displaced fractures. History of nasal fractures twice as a child. Also history of nasal polypectomy and septoplasty in her 70's in [place].  PMH: Past Medical History  Diagnosis Date  . Allergic rhinitis   . Diabetes mellitus   . Urinary tract infection   . Migraines   . Palpitations   . Asthma   . Hyperlipemia   . Kidney stone   . Glaucoma     Surg Hx: Past Surgical History  Procedure Date  . Rotator cuff repair     right  . Tonsillectomy   . Abdominal hysterectomy     Has ovaries  . Deviated septum repair   . Lithotripsy   . Back surgery 1987    lumb  . Colonoscopy     FHx:   Family History  Problem Relation Age of Onset  . Diabetes Father   . Colon cancer Neg Hx   . Esophageal cancer Neg Hx   . Rectal  cancer Neg Hx   . Stomach cancer Neg Hx   . Breast cancer Neg Hx    SocHx:  reports that she has quit smoking. Her smoking use included Cigarettes. She has never used smokeless tobacco. She reports that she drinks alcohol. She reports that she does not use illicit drugs.  ALLERGIES:  Allergies  Allergen Reactions  . Papain Anaphylaxis    Takes an extreme concentration  . Papaya Derivatives Anaphylaxis  . Mushroom Ext Cmplx(Shiitake-Reishi-Mait) Other (See Comments)    Head congestion and sore throat  . Venlafaxine Other (See Comments)    Pt states she can take Name brand. Lethargy    No prescriptions prior to admission    No results found for this or any previous visit (from the past 48 hour(s)). No results found.  WUJ:WJXBJYNW: Not feeling tired (fatigue).  No fever, no night sweats, and no recent weight loss. Head: No headache. Eyes: No eye symptoms. Otolaryngeal: No hearing loss, no earache, no tinnitus, and no purulent nasal discharge.  No nasal passage blockage (stuffiness), no snoring, no sneezing, no hoarseness, and no sore throat. Cardiovascular: No chest pain or discomfort  and no palpitations. Pulmonary: No dyspnea, no cough, and no wheezing. Gastrointestinal: No dysphagia  and no heartburn.  No nausea, no abdominal pain, and no melena.  No diarrhea. Genitourinary: No  dysuria. Endocrine: No muscle weakness. Musculoskeletal: No calf muscle cramps, no arthralgias, and no soft tissue swelling. Neurological: No dizziness, no fainting, no tingling, and no numbness. Psychological: No anxiety  and no depression. Skin: Rash:.  Height 5\' 3"  (1.6 m), weight 77.111 kg (170 lb).  PHYSICAL EXAM:  Height: 63.5 in, Weight: 172 lb, BMI: 30 kg/m2  BP: 136/73 mm Hg   HR: 107 b/min     APPEARANCE: Well developed, well nourished, in no acute distress.  Normal affect, in a pleasant mood. Oriented to time, place and person. COMMUNICATION: Normal voice   HEAD & FACE:  No scars,  lesions or masses of head and face.  Bilateral infraorbital ecchymosis. Sinuses nontender to palpation.  Salivary glands without mass or tenderness.  Facial strength symmetric.   EYES: EOMI with normal primary gaze alignment. Visual acuity grossly intact.  PERRLA. No bony step-off of orbital rim. EXTERNAL EAR & NOSE: No scars, lesions or masses. Mild swelling and ecchymosis of nasal dorsum. Concave curvature to the right.  EAC & TYMPANIC MEMBRANE:  EAC shows no obstructing lesions or debris and tympanic membranes are normal bilaterally. No middle ear effusion, infection or cholesteatoma. GROSS HEARING: Normal   TMJ:  Nontender  INTRANASAL EXAM: Septum midline, no septal hematoma. No polyps or purulence.  NASOPHARYNX: Normal, without lesions. LIPS, TEETH & GUMS: No lip lesions and normal gums. Right lower pre-molar chipped. ORAL CAVITY/OROPHARYNX:  Oral mucosa moist without lesion or asymmetry of the palate, tongue or posterior pharynx. Tonsils absent.  NECK:  Supple without adenopathy or mass. THYROID:  Normal with no masses palpable.  NEUROLOGIC:  No gross CN deficits. No nystagmus noted.   LYMPHATIC:  No enlarged nodes palpable. CARDIOVASCULAR: regular rate and rhythm.  LUNGS: clear to auscultation bilaterally.  Studies Reviewed:  CT maxillofacial    Assessment/Plan  Fracture of nasal bones (802.0) (S02.2XXA).  Concussion (850.9) (S06.0X9A).  I am concerned that you are having headaches, sleepiness, trouble focusing your eyes.  This could all be related to concussion.  I am also concerned that we're not exactly sure what happened to you that caused the fall.  Could it have been fainting, or some sort of seizure, or a heart rhythm disturbance  I need for you to see Dr. Beverely Low in the near future to sort this out.  I also think you might benefit from seeing Dr. Hazle Quant, and of course, you do need to see your dentist.   Referrable to me and my expertise, the nose seems to be deviated to the  LEFT.  I trust you when you tell me this is more than before.  It is still moderately swollen, so I would defer the final decision for when the swelling is further reduced and the exam is more clear.   At this time, we will plan to do a closed reduction with stabilization of your nasal fracture Monday AM under a light general anesthesia.    This takes about 15 minutes, home the same day, and with a foam rubber and aluminum splint on the outside for the better part of 10 days.  You should not have worsening of your black eyes.  You should not need any pain medication.  I would plan on seeing you back around 10 days here in the office.  If the splint is still in place at that time, I will remove it then.     The nose will be somewhat fragile for 6 weeks, so avoid  banging it again  or it may break again  more easily.  Flo Shanks 04/26/2012, 4:30 PM

## 2012-04-29 ENCOUNTER — Ambulatory Visit (HOSPITAL_BASED_OUTPATIENT_CLINIC_OR_DEPARTMENT_OTHER)
Admission: RE | Admit: 2012-04-29 | Discharge: 2012-04-29 | Disposition: A | Discharge status: Home / Self Care | Payer: Medicare Other | Source: Ambulatory Visit | Attending: Otolaryngology | Admitting: Otolaryngology

## 2012-04-29 ENCOUNTER — Ambulatory Visit (HOSPITAL_BASED_OUTPATIENT_CLINIC_OR_DEPARTMENT_OTHER): Payer: Medicare Other | Admitting: Anesthesiology

## 2012-04-29 ENCOUNTER — Encounter (HOSPITAL_BASED_OUTPATIENT_CLINIC_OR_DEPARTMENT_OTHER)
Admission: RE | Disposition: A | Discharge status: Home / Self Care | Payer: Self-pay | Source: Ambulatory Visit | Attending: Otolaryngology

## 2012-04-29 ENCOUNTER — Encounter (HOSPITAL_BASED_OUTPATIENT_CLINIC_OR_DEPARTMENT_OTHER): Payer: Self-pay | Admitting: Anesthesiology

## 2012-04-29 ENCOUNTER — Encounter (HOSPITAL_BASED_OUTPATIENT_CLINIC_OR_DEPARTMENT_OTHER): Payer: Self-pay | Admitting: *Deleted

## 2012-04-29 DIAGNOSIS — E785 Hyperlipidemia, unspecified: Secondary | ICD-10-CM | POA: Diagnosis not present

## 2012-04-29 DIAGNOSIS — J45909 Unspecified asthma, uncomplicated: Secondary | ICD-10-CM | POA: Diagnosis not present

## 2012-04-29 DIAGNOSIS — Z01812 Encounter for preprocedural laboratory examination: Secondary | ICD-10-CM | POA: Diagnosis not present

## 2012-04-29 DIAGNOSIS — G43909 Migraine, unspecified, not intractable, without status migrainosus: Secondary | ICD-10-CM | POA: Diagnosis not present

## 2012-04-29 DIAGNOSIS — Y92009 Unspecified place in unspecified non-institutional (private) residence as the place of occurrence of the external cause: Secondary | ICD-10-CM | POA: Insufficient documentation

## 2012-04-29 DIAGNOSIS — H409 Unspecified glaucoma: Secondary | ICD-10-CM | POA: Insufficient documentation

## 2012-04-29 DIAGNOSIS — W19XXXA Unspecified fall, initial encounter: Secondary | ICD-10-CM | POA: Insufficient documentation

## 2012-04-29 DIAGNOSIS — Z87442 Personal history of urinary calculi: Secondary | ICD-10-CM | POA: Insufficient documentation

## 2012-04-29 DIAGNOSIS — Z01810 Encounter for preprocedural cardiovascular examination: Secondary | ICD-10-CM | POA: Diagnosis not present

## 2012-04-29 DIAGNOSIS — S022XXA Fracture of nasal bones, initial encounter for closed fracture: Secondary | ICD-10-CM | POA: Insufficient documentation

## 2012-04-29 DIAGNOSIS — E119 Type 2 diabetes mellitus without complications: Secondary | ICD-10-CM | POA: Diagnosis not present

## 2012-04-29 HISTORY — PX: CLOSED REDUCTION NASAL FRACTURE: SHX5365

## 2012-04-29 HISTORY — DX: Unspecified glaucoma: H40.9

## 2012-04-29 LAB — POCT I-STAT, CHEM 8
Chloride: 106 mEq/L (ref 96–112)
Creatinine, Ser: 1 mg/dL (ref 0.50–1.10)
Hemoglobin: 13.3 g/dL (ref 12.0–15.0)
Potassium: 3.8 mEq/L (ref 3.5–5.1)
Sodium: 141 mEq/L (ref 135–145)

## 2012-04-29 SURGERY — CLOSED REDUCTION, FRACTURE, NASAL BONE
Anesthesia: General | Site: Nose | Wound class: Clean Contaminated

## 2012-04-29 MED ORDER — PROPOFOL 10 MG/ML IV BOLUS
INTRAVENOUS | Status: DC | PRN
  Administered 2012-04-29: 200 mg via INTRAVENOUS

## 2012-04-29 MED ORDER — LACTATED RINGERS IV SOLN
INTRAVENOUS | Status: DC
  Administered 2012-04-29: 09:00:00 via INTRAVENOUS

## 2012-04-29 MED ORDER — LIDOCAINE HCL (CARDIAC) 20 MG/ML IV SOLN
INTRAVENOUS | Status: DC | PRN
  Administered 2012-04-29: 50 mg via INTRAVENOUS

## 2012-04-29 MED ORDER — VANCOMYCIN HCL IN DEXTROSE 1-5 GM/200ML-% IV SOLN
INTRAVENOUS | Status: AC
  Filled 2012-04-29: qty 200

## 2012-04-29 MED ORDER — FENTANYL CITRATE 0.05 MG/ML IJ SOLN
INTRAMUSCULAR | Status: DC | PRN
  Administered 2012-04-29 (×2): 50 ug via INTRAVENOUS

## 2012-04-29 SURGICAL SUPPLY — 36 items
APPLICATOR COTTON TIP 6IN STRL (MISCELLANEOUS) IMPLANT
CANISTER SUCTION 1200CC (MISCELLANEOUS) ×2 IMPLANT
CLOTH BEACON ORANGE TIMEOUT ST (SAFETY) ×2 IMPLANT
CONT SPEC 4OZ CLIKSEAL STRL BL (MISCELLANEOUS) IMPLANT
CONT SPECI 4OZ STER CLIK (MISCELLANEOUS) IMPLANT
COTTONBALL LRG STERILE PKG (GAUZE/BANDAGES/DRESSINGS) IMPLANT
DECANTER SPIKE VIAL GLASS SM (MISCELLANEOUS) IMPLANT
DEPRESSOR TONGUE BLADE STERILE (MISCELLANEOUS) IMPLANT
DRSG NASAL KENNEDY LMNT 8CM (GAUZE/BANDAGES/DRESSINGS) IMPLANT
DRSG TELFA 3X8 NADH (GAUZE/BANDAGES/DRESSINGS) IMPLANT
GAUZE PACKING FOLDED 2  STR (GAUZE/BANDAGES/DRESSINGS)
GAUZE PACKING FOLDED 2 STR (GAUZE/BANDAGES/DRESSINGS) IMPLANT
GAUZE SPONGE 4X4 12PLY STRL LF (GAUZE/BANDAGES/DRESSINGS) IMPLANT
GAUZE SPONGE 4X4 16PLY XRAY LF (GAUZE/BANDAGES/DRESSINGS) IMPLANT
GLOVE ECLIPSE 8.0 STRL XLNG CF (GLOVE) ×2 IMPLANT
GOWN PREVENTION PLUS XLARGE (GOWN DISPOSABLE) IMPLANT
KIT SPLINT NASAL DENVER LRG BE (GAUZE/BANDAGES/DRESSINGS) IMPLANT
KIT SPLINT NASAL DENVER MIN BE (GAUZE/BANDAGES/DRESSINGS) IMPLANT
KIT SPLINT NASAL DENVER PET BE (GAUZE/BANDAGES/DRESSINGS) IMPLANT
KIT SPLINT NASAL DENVER SM BEI (GAUZE/BANDAGES/DRESSINGS) ×2 IMPLANT
MARKER SKIN DUAL TIP RULER LAB (MISCELLANEOUS) IMPLANT
NDL SAFETY ECLIPSE 18X1.5 (NEEDLE) IMPLANT
NEEDLE HYPO 18GX1.5 SHARP (NEEDLE)
NEEDLE HYPO 22GX1.5 SAFETY (NEEDLE) IMPLANT
NEEDLE HYPO 25X1 1.5 SAFETY (NEEDLE) IMPLANT
PAD ALCOHOL SWAB (MISCELLANEOUS) IMPLANT
SHEET MEDIUM DRAPE 40X70 STRL (DRAPES) ×2 IMPLANT
SPONGE GAUZE 2X2 8PLY STRL LF (GAUZE/BANDAGES/DRESSINGS) IMPLANT
SPONGE NEURO XRAY DETECT 1X3 (DISPOSABLE) ×2 IMPLANT
SURGILUBE 2OZ TUBE FLIPTOP (MISCELLANEOUS) IMPLANT
SUT SILK 2 0 FS (SUTURE) IMPLANT
SYR 5ML LL (SYRINGE) IMPLANT
SYR CONTROL 10ML LL (SYRINGE) IMPLANT
TOWEL OR NON WOVEN STRL DISP B (DISPOSABLE) ×2 IMPLANT
TUBE CONNECTING 20X1/4 (TUBING) ×2 IMPLANT
YANKAUER SUCT BULB TIP NO VENT (SUCTIONS) ×2 IMPLANT

## 2012-04-29 NOTE — Anesthesia Procedure Notes (Signed)
Procedure Name: LMA Insertion Date/Time: 04/29/2012 10:34 AM Performed by: Gar Gibbon Pre-anesthesia Checklist: Patient identified, Emergency Drugs available, Suction available and Patient being monitored Patient Re-evaluated:Patient Re-evaluated prior to inductionOxygen Delivery Method: Circle System Utilized Preoxygenation: Pre-oxygenation with 100% oxygen Intubation Type: IV induction Ventilation: Mask ventilation without difficulty LMA: LMA inserted LMA Size: 4.0 Number of attempts: 1 Airway Equipment and Method: bite block Placement Confirmation: positive ETCO2 Tube secured with: Tape Dental Injury: Teeth and Oropharynx as per pre-operative assessment

## 2012-04-29 NOTE — Transfer of Care (Signed)
Immediate Anesthesia Transfer of Care Note  Patient: [name]  Procedure(s) Performed: Procedure(s) (LRB) with comments: CLOSED REDUCTION NASAL FRACTURE (N/A) - WITH STABILIZATION  Patient Location: PACU  Anesthesia Type:General  Level of Consciousness: awake, alert  and oriented  Airway & Oxygen Therapy: Patient Spontanous Breathing and aerosol face mask  Post-op Assessment: Report given to PACU RN and Post -op Vital signs reviewed and stable  Post vital signs: Reviewed and stable  Complications: No apparent anesthesia complications

## 2012-04-29 NOTE — Op Note (Signed)
04/29/2012  10:50 AM    Julieanne Cotton  [id number]   Pre-Op Dx:  Closed displaced nasal fracture  Post-op Dx: Same  Proc: Closed reduction nasal fracture with stabilization   Surg:  Flo Shanks T MD  Anes: General LMA  EBL:  None  Comp:  None  Findings:  Leftward displacement of the bony nasal dorsum, easily reduced.  Procedure: The patient received preoperative Afrin nasal spray for decongestion and hemostasis.  In a comfortable supine position, general LMA anesthesia was administered. At an appropriate level, she was placed in a slight sitting position. The nose was inspected with the findings as described above. A blunt fracture elevator was measured to the level of the medial canthus to avoid trauma to the cribriform plates. This was placed under the nasal dorsum and with anterior and rightward pressure, dorsum was reduced back into a midline configuration. A good symmetric reduction was accomplished with good support of the dorsum. Hemostasis was spontaneous.  The external nose was cleaned with alcohol and painted with skin prep followed by 1/2 inch Steri-Strips in the standard fashion. A small/medium [place] splint was fashioned and then placed on the nose and compressed slightly for support.  At this point the procedure was completed. Hemostasis was observed. The patient was returned to anesthesia, awakened, extubated, and transferred to recovery in stable condition.  Dispo:   PACU to home  Plan:  Ice, elevation. We'll remove the splint in 10 days. Given low anticipated risk of postanesthetic or postsurgical complications, feel an outpatient venue is appropriate.  Cephus Richer MD

## 2012-04-29 NOTE — Anesthesia Preprocedure Evaluation (Signed)
Anesthesia Evaluation  Patient identified by MRN, date of birth, ID band Patient awake    Reviewed: Allergy & Precautions, H&P , NPO status , Patient's Chart, lab work & pertinent test results  Airway Mallampati: III TM Distance: >3 FB Neck ROM: Full    Dental No notable dental hx. (+) Teeth Intact, Chipped and Dental Advisory Given   Pulmonary asthma ,  breath sounds clear to auscultation  Pulmonary exam normal       Cardiovascular negative cardio ROS  Rhythm:Regular Rate:Normal     Neuro/Psych  Headaches, negative psych ROS   GI/Hepatic negative GI ROS, Neg liver ROS,   Endo/Other  diabetes, Well Controlled, Type 2, Oral Hypoglycemic Agents  Renal/GU negative Renal ROS  negative genitourinary   Musculoskeletal   Abdominal   Peds  Hematology negative hematology ROS (+)   Anesthesia Other Findings   Reproductive/Obstetrics negative OB ROS                           Anesthesia Physical Anesthesia Plan  ASA: II  Anesthesia Plan: General   Post-op Pain Management:    Induction: Intravenous  Airway Management Planned: LMA  Additional Equipment:   Intra-op Plan:   Post-operative Plan: Extubation in OR  Informed Consent: I have reviewed the patients History and Physical, chart, labs and discussed the procedure including the risks, benefits and alternatives for the proposed anesthesia with the patient or authorized representative who has indicated his/her understanding and acceptance.   Dental advisory given  Plan Discussed with: CRNA  Anesthesia Plan Comments:         Anesthesia Quick Evaluation

## 2012-04-29 NOTE — Anesthesia Postprocedure Evaluation (Signed)
  Anesthesia Post-op Note  Patient: [name]  Procedure(s) Performed: Procedure(s) (LRB) with comments: CLOSED REDUCTION NASAL FRACTURE (N/A) - WITH STABILIZATION  Patient Location: PACU  Anesthesia Type:General  Level of Consciousness: awake, alert  and oriented  Airway and Oxygen Therapy: Patient Spontanous Breathing  Post-op Pain: none  Post-op Assessment: Post-op Vital signs reviewed, Patient's Cardiovascular Status Stable, Respiratory Function Stable, Patent Airway and No signs of Nausea or vomiting  Post-op Vital Signs: Reviewed and stable  Complications: No apparent anesthesia complications

## 2012-04-29 NOTE — Interval H&P Note (Signed)
History and Physical Interval Note:  04/29/2012 9:57 AM  [name]  has presented today for surgery, with the diagnosis of CLOSED DISPLACED NASAL FRACTURE  The various methods of treatment have been discussed with the patient and family. After consideration of risks, benefits and other options for treatment, the patient has consented to  Procedure(s) (LRB) with comments: CLOSED REDUCTION NASAL FRACTURE (N/A) - WITH STABILIZATION as a surgical intervention .  The patient's history has been re-reviewed, patient re-examined, no change in status, stable for surgery.  I have re-reviewed the patient's chart and labs.  Questions were answered to the patient's satisfaction.     Flo Shanks

## 2012-04-30 ENCOUNTER — Encounter (HOSPITAL_BASED_OUTPATIENT_CLINIC_OR_DEPARTMENT_OTHER): Payer: Self-pay | Admitting: Otolaryngology

## 2012-05-02 DIAGNOSIS — L259 Unspecified contact dermatitis, unspecified cause: Secondary | ICD-10-CM | POA: Diagnosis not present

## 2012-05-02 DIAGNOSIS — R21 Rash and other nonspecific skin eruption: Secondary | ICD-10-CM | POA: Diagnosis not present

## 2012-05-06 ENCOUNTER — Encounter: Payer: Self-pay | Admitting: Family Medicine

## 2012-05-06 ENCOUNTER — Ambulatory Visit (INDEPENDENT_AMBULATORY_CARE_PROVIDER_SITE_OTHER): Payer: Medicare Other | Admitting: Family Medicine

## 2012-05-06 VITALS — BP 120/68 | HR 85 | Temp 98.0°F | Wt 179.0 lb

## 2012-05-06 DIAGNOSIS — K9041 Non-celiac gluten sensitivity: Secondary | ICD-10-CM | POA: Insufficient documentation

## 2012-05-06 DIAGNOSIS — K900 Celiac disease: Secondary | ICD-10-CM

## 2012-05-06 NOTE — Progress Notes (Signed)
  Subjective:    Patient ID: [name], female    DOB: [birth year], 71 y.o.   MRN: [id number]  HPI Gluten intolerance- pt has been struggling w/ rash and itching for months.  Has seen multiple dermatologists, has tried oral steroids, steroid injxns, creams w/out relief.  When she eats a gluten free diet, sxs dramatically improve.  When she does eat gluten, itching will return.  Has tested negative for celiac disease on multiple occasions.  Wants to know what i think about this.   Review of Systems For ROS see HPI     Objective:   Physical Exam  Vitals reviewed. Constitutional: She is oriented to person, place, and time. She appears well-developed and well-nourished. No distress.  Neurological: She is alert and oriented to person, place, and time.  Skin: Skin is warm and dry. Rash (faint erythema on trunk) noted.  Psychiatric:       Rapid, almost pressured speech          Assessment & Plan:

## 2012-05-06 NOTE — Patient Instructions (Addendum)
Continue the gluten free diet since the rash is improving There is no reason not to believe the evidence- the rash is better! Keep up the good work!!  You look great! Call with any questions or concerns Happy Thanksgiving!!!

## 2012-05-12 NOTE — Assessment & Plan Note (Signed)
New.  Unclear is this is an actual problem for pt or not.  Rash that has been unresponsive to multiple treatment approaches and after seeing multiple dermatologists improves w/ elimination of gluten from diet (per pt report).  At this time, pt to continue gluten free eating and see if she remains sxs free.  Pt expressed understanding and is in agreement w/ plan.

## 2012-07-16 DIAGNOSIS — M545 Low back pain: Secondary | ICD-10-CM | POA: Diagnosis not present

## 2012-07-16 DIAGNOSIS — M546 Pain in thoracic spine: Secondary | ICD-10-CM | POA: Diagnosis not present

## 2012-07-16 DIAGNOSIS — M999 Biomechanical lesion, unspecified: Secondary | ICD-10-CM | POA: Diagnosis not present

## 2012-07-19 DIAGNOSIS — M999 Biomechanical lesion, unspecified: Secondary | ICD-10-CM | POA: Diagnosis not present

## 2012-07-19 DIAGNOSIS — M546 Pain in thoracic spine: Secondary | ICD-10-CM | POA: Diagnosis not present

## 2012-07-19 DIAGNOSIS — M545 Low back pain: Secondary | ICD-10-CM | POA: Diagnosis not present

## 2012-07-22 ENCOUNTER — Other Ambulatory Visit: Payer: Self-pay | Admitting: Family Medicine

## 2012-07-22 NOTE — Telephone Encounter (Signed)
Please advise on RF request.  Last OV:05-01-12.//AB/CMA

## 2012-08-03 ENCOUNTER — Other Ambulatory Visit: Payer: Self-pay

## 2012-08-07 DIAGNOSIS — M545 Low back pain: Secondary | ICD-10-CM | POA: Diagnosis not present

## 2012-08-07 DIAGNOSIS — M546 Pain in thoracic spine: Secondary | ICD-10-CM | POA: Diagnosis not present

## 2012-08-07 DIAGNOSIS — M999 Biomechanical lesion, unspecified: Secondary | ICD-10-CM | POA: Diagnosis not present

## 2012-08-28 ENCOUNTER — Telehealth: Payer: Self-pay | Admitting: Family Medicine

## 2012-08-28 NOTE — Telephone Encounter (Signed)
refill ACTOPLUS MET 15-85 MG Take 1 tablet by mouth each day #30 last fill 2.12.14

## 2012-08-30 MED ORDER — PIOGLITAZONE HCL-METFORMIN HCL 15-850 MG PO TABS: ORAL_TABLET | ORAL | Status: DC

## 2012-09-10 DIAGNOSIS — E119 Type 2 diabetes mellitus without complications: Secondary | ICD-10-CM | POA: Diagnosis not present

## 2012-10-25 ENCOUNTER — Ambulatory Visit (INDEPENDENT_AMBULATORY_CARE_PROVIDER_SITE_OTHER): Payer: Medicare Other | Admitting: Family Medicine

## 2012-10-25 ENCOUNTER — Encounter: Payer: Self-pay | Admitting: Family Medicine

## 2012-10-25 VITALS — BP 128/62 | HR 80 | Temp 98.3°F | Ht 63.25 in | Wt 187.6 lb

## 2012-10-25 DIAGNOSIS — R21 Rash and other nonspecific skin eruption: Secondary | ICD-10-CM

## 2012-10-25 DIAGNOSIS — E119 Type 2 diabetes mellitus without complications: Secondary | ICD-10-CM | POA: Diagnosis not present

## 2012-10-25 DIAGNOSIS — J309 Allergic rhinitis, unspecified: Secondary | ICD-10-CM | POA: Diagnosis not present

## 2012-10-25 DIAGNOSIS — E781 Pure hyperglyceridemia: Secondary | ICD-10-CM

## 2012-10-25 LAB — LIPID PANEL
Cholesterol: 166 mg/dL (ref 0–200)
HDL: 50.3 mg/dL (ref >39.00)
Triglycerides: 101 mg/dL (ref 0.0–149.0)
VLDL: 20.2 mg/dL (ref 0.0–40.0)

## 2012-10-25 LAB — BASIC METABOLIC PANEL
BUN: 20 mg/dL (ref 6–23)
Creatinine, Ser: 1 mg/dL (ref 0.4–1.2)
GFR: 59.83 mL/min — ABNORMAL LOW (ref >60.00)
Potassium: 3.4 mEq/L — ABNORMAL LOW (ref 3.5–5.1)

## 2012-10-25 LAB — TSH: TSH: 2.26 u[IU]/mL (ref 0.35–5.50)

## 2012-10-25 LAB — HEPATIC FUNCTION PANEL: Total Bilirubin: 0.4 mg/dL (ref 0.3–1.2)

## 2012-10-25 MED ORDER — MELATONIN 10 MG PO CAPS: 1.0000 | ORAL_CAPSULE | Freq: Every day | ORAL | Status: DC

## 2012-10-25 MED ORDER — CETIRIZINE HCL 10 MG PO TABS: 10.0000 mg | ORAL_TABLET | Freq: Every day | ORAL | Status: DC

## 2012-10-25 MED ORDER — FENOFIBRATE 145 MG PO TABS: 145.0000 mg | ORAL_TABLET | Freq: Every day | ORAL | Status: DC

## 2012-10-25 MED ORDER — LIRAGLUTIDE 18 MG/3ML ~~LOC~~ SOPN: 1.6000 mg | PEN_INJECTOR | Freq: Every day | SUBCUTANEOUS | Status: DC

## 2012-10-25 MED ORDER — LIRAGLUTIDE 18 MG/3ML ~~LOC~~ SOPN: 1.2000 mg | PEN_INJECTOR | Freq: Every day | SUBCUTANEOUS | Status: DC

## 2012-10-25 MED ORDER — TRIAMCINOLONE ACETONIDE 0.1 % EX OINT: TOPICAL_OINTMENT | Freq: Two times a day (BID) | CUTANEOUS | Status: DC

## 2012-10-25 NOTE — Progress Notes (Signed)
  Subjective:    Patient ID: [name], female    DOB: August 02, 1940, 72 y.o.   MRN: [id number]  HPI Hypertriglyceridemia- chronic problem, on Tricor.  Denies abd pain, N/V, myalgias  DM- chronic problem, on Actoplusmet.  Pt would like to switch med due to increased appetite.  Denies symptomatic lows.  No CP, SOB, HAs, visual changes, edema.  UTD on eye exam.  No numbness/tingling.  Husband is on Victoza and she wants to be on this for appetite suppression.  Allergic rhinitis- needs refill on Zyrtec  Rash- bilateral hips, very itchy.  1st noticed 8-10 days ago after working in the garden.  Only on hips, not spreading.   Review of Systems For ROS see HPI     Objective:   Physical Exam  Vitals reviewed. Constitutional: She is oriented to person, place, and time. She appears well-developed and well-nourished. No distress.  HENT:  Head: Normocephalic and atraumatic.  Eyes: Conjunctivae and EOM are normal. Pupils are equal, round, and reactive to light.  Neck: Normal range of motion. Neck supple. No thyromegaly present.  Cardiovascular: Normal rate, regular rhythm, normal heart sounds and intact distal pulses.   No murmur heard. Pulmonary/Chest: Effort normal and breath sounds normal. No respiratory distress.  Abdominal: Soft. She exhibits no distension. There is no tenderness.  Musculoskeletal: She exhibits no edema.  Lymphadenopathy:    She has no cervical adenopathy.  Neurological: She is alert and oriented to person, place, and time.  Skin: Skin is warm and dry. Rash (scabbed area consistent w/ contact dermatitis) noted.  Psychiatric: She has a normal mood and affect. Her behavior is normal.          Assessment & Plan:

## 2012-10-25 NOTE — Patient Instructions (Addendum)
Follow up in 3 months to recheck sugars We'll notify you of your lab results STOP the ActoPlusMet START the Victoza as directed- 0.6 mg x1 week and then increase to 1.2 mg daily We'll notify you of your lab results Use the Triamcinolone twice daily on the rash Call with any questions or concerns Happy Mother's Day and Happy Belated Birthday!

## 2012-10-27 NOTE — Assessment & Plan Note (Signed)
Chronic problem.  Asymptomatic.  UTD on eye exam.  Pt concerned b/c meds are increasing her appetite (Actos).  Will switch to Victoza to control diabetes and manage appetite.  Pt expressed understanding and is in agreement w/ plan.

## 2012-10-27 NOTE — Assessment & Plan Note (Signed)
Refill on Zyrtec provided.

## 2012-10-27 NOTE — Assessment & Plan Note (Signed)
Chronic problem.  Tolerating meds w/out difficulty.  Check labs.  Adjust meds prn  

## 2012-10-27 NOTE — Assessment & Plan Note (Signed)
New.  Suspect contact dermatitis.  Already scabbed.  Start topical steroid ointment.  Pt expressed understanding and is in agreement w/ plan.

## 2012-11-08 DIAGNOSIS — M62838 Other muscle spasm: Secondary | ICD-10-CM | POA: Diagnosis not present

## 2012-11-08 DIAGNOSIS — M545 Low back pain: Secondary | ICD-10-CM | POA: Diagnosis not present

## 2012-11-08 DIAGNOSIS — M999 Biomechanical lesion, unspecified: Secondary | ICD-10-CM | POA: Diagnosis not present

## 2012-11-08 DIAGNOSIS — M546 Pain in thoracic spine: Secondary | ICD-10-CM | POA: Diagnosis not present

## 2012-11-13 DIAGNOSIS — M545 Low back pain: Secondary | ICD-10-CM | POA: Diagnosis not present

## 2012-11-13 DIAGNOSIS — M999 Biomechanical lesion, unspecified: Secondary | ICD-10-CM | POA: Diagnosis not present

## 2012-11-13 DIAGNOSIS — M546 Pain in thoracic spine: Secondary | ICD-10-CM | POA: Diagnosis not present

## 2012-11-13 DIAGNOSIS — M62838 Other muscle spasm: Secondary | ICD-10-CM | POA: Diagnosis not present

## 2012-11-14 ENCOUNTER — Encounter: Payer: Self-pay | Admitting: Family Medicine

## 2012-11-14 DIAGNOSIS — R21 Rash and other nonspecific skin eruption: Secondary | ICD-10-CM

## 2012-11-15 DIAGNOSIS — M546 Pain in thoracic spine: Secondary | ICD-10-CM | POA: Diagnosis not present

## 2012-11-15 DIAGNOSIS — M999 Biomechanical lesion, unspecified: Secondary | ICD-10-CM | POA: Diagnosis not present

## 2012-11-15 DIAGNOSIS — M62838 Other muscle spasm: Secondary | ICD-10-CM | POA: Diagnosis not present

## 2012-11-15 DIAGNOSIS — M545 Low back pain: Secondary | ICD-10-CM | POA: Diagnosis not present

## 2012-11-15 NOTE — Telephone Encounter (Signed)
Referral placed.

## 2012-11-19 ENCOUNTER — Other Ambulatory Visit: Payer: Self-pay | Admitting: Family Medicine

## 2012-11-22 NOTE — Telephone Encounter (Signed)
Med filled.  

## 2012-12-09 DIAGNOSIS — M999 Biomechanical lesion, unspecified: Secondary | ICD-10-CM | POA: Diagnosis not present

## 2012-12-09 DIAGNOSIS — M545 Low back pain: Secondary | ICD-10-CM | POA: Diagnosis not present

## 2012-12-09 DIAGNOSIS — M62838 Other muscle spasm: Secondary | ICD-10-CM | POA: Diagnosis not present

## 2012-12-09 DIAGNOSIS — M546 Pain in thoracic spine: Secondary | ICD-10-CM | POA: Diagnosis not present

## 2012-12-10 DIAGNOSIS — L259 Unspecified contact dermatitis, unspecified cause: Secondary | ICD-10-CM | POA: Diagnosis not present

## 2012-12-10 DIAGNOSIS — L2089 Other atopic dermatitis: Secondary | ICD-10-CM | POA: Diagnosis not present

## 2012-12-26 ENCOUNTER — Encounter: Payer: Self-pay | Admitting: Family Medicine

## 2013-01-07 DIAGNOSIS — H4011X0 Primary open-angle glaucoma, stage unspecified: Secondary | ICD-10-CM | POA: Diagnosis not present

## 2013-01-07 DIAGNOSIS — H2510 Age-related nuclear cataract, unspecified eye: Secondary | ICD-10-CM | POA: Diagnosis not present

## 2013-01-07 DIAGNOSIS — H409 Unspecified glaucoma: Secondary | ICD-10-CM | POA: Diagnosis not present

## 2013-01-21 ENCOUNTER — Telehealth: Payer: Self-pay | Admitting: Family Medicine

## 2013-01-21 MED ORDER — SCOPOLAMINE 1 MG/3DAYS TD PT72: 1.0000 | MEDICATED_PATCH | TRANSDERMAL | Status: DC

## 2013-01-21 NOTE — Telephone Encounter (Signed)
Ok for scopolamine patch.  Change every 3 days.  #4 (1 box)

## 2013-01-21 NOTE — Telephone Encounter (Signed)
Called and LM informing the pt that Dr. Tawni Carnes the motion sickness patches and I will sent the rx to her pharmacy.  Informed the pt if she has any questions she can give me a call back.  Rx sent to the pharmacy by e-script.//AB/CMA

## 2013-01-21 NOTE — Telephone Encounter (Signed)
Patient called stating she is going on a trip and will be on a boat for about 7-8 days. She is requesting motion sickness patches be sent to [facility]. She states she has the bracelets, but she doesn't know if that will be enough.

## 2013-03-05 ENCOUNTER — Other Ambulatory Visit: Payer: Self-pay | Admitting: *Deleted

## 2013-03-05 DIAGNOSIS — M546 Pain in thoracic spine: Secondary | ICD-10-CM | POA: Diagnosis not present

## 2013-03-05 DIAGNOSIS — M62838 Other muscle spasm: Secondary | ICD-10-CM | POA: Diagnosis not present

## 2013-03-05 DIAGNOSIS — M545 Low back pain: Secondary | ICD-10-CM | POA: Diagnosis not present

## 2013-03-05 DIAGNOSIS — M999 Biomechanical lesion, unspecified: Secondary | ICD-10-CM | POA: Diagnosis not present

## 2013-03-05 MED ORDER — LIRAGLUTIDE 18 MG/3ML ~~LOC~~ SOPN: 1.2000 mg | PEN_INJECTOR | Freq: Every day | SUBCUTANEOUS | Status: DC

## 2013-03-05 NOTE — Telephone Encounter (Signed)
Rx was refilled for victoza pen.  Ag cma

## 2013-03-11 DIAGNOSIS — M546 Pain in thoracic spine: Secondary | ICD-10-CM | POA: Diagnosis not present

## 2013-03-11 DIAGNOSIS — M545 Low back pain: Secondary | ICD-10-CM | POA: Diagnosis not present

## 2013-03-11 DIAGNOSIS — M62838 Other muscle spasm: Secondary | ICD-10-CM | POA: Diagnosis not present

## 2013-03-11 DIAGNOSIS — M999 Biomechanical lesion, unspecified: Secondary | ICD-10-CM | POA: Diagnosis not present

## 2013-03-18 DIAGNOSIS — Z23 Encounter for immunization: Secondary | ICD-10-CM | POA: Diagnosis not present

## 2013-03-26 ENCOUNTER — Encounter: Payer: Self-pay | Admitting: Family Medicine

## 2013-03-26 ENCOUNTER — Other Ambulatory Visit: Payer: Self-pay | Admitting: General Practice

## 2013-03-26 MED ORDER — INSULIN PEN NEEDLE 32G X 6 MM MISC: 1.0000 | Freq: Every day | Status: DC

## 2013-04-21 IMAGING — CT CT ABD-PELV W/O CM
2 of 4 series · 16 of 46 positions shown, 18 images · non-contrast
Comparison: No priors.

CLINICAL DATA: Bilateral flank pain and gross hematuria.

CT ABDOMEN AND PELVIS WITHOUT CONTRAST
TECHNIQUE: Multidetector CT imaging of the abdomen and pelvis was
performed following the standard protocol without intravenous
contrast.

[Series 2: renal stone > 200 lbs 5.0 b31f · axial · 0.76mm/px · z∈[-448,-63]mm · 13 of 85 slices shown, 15 images]
[im 4/85  soft-tissue]
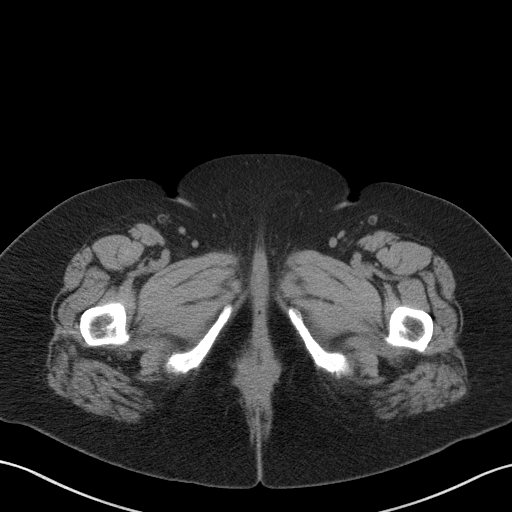
[im 4/85  bone]
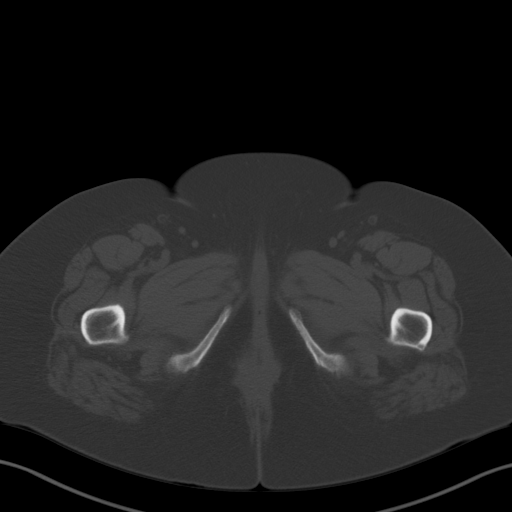
[im 11/85  soft-tissue]
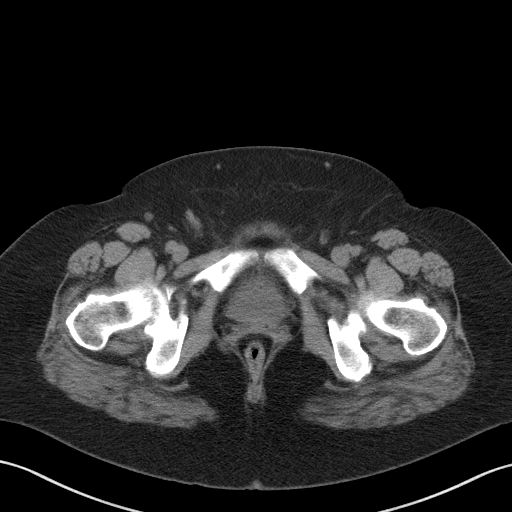
[im 19/85  soft-tissue]
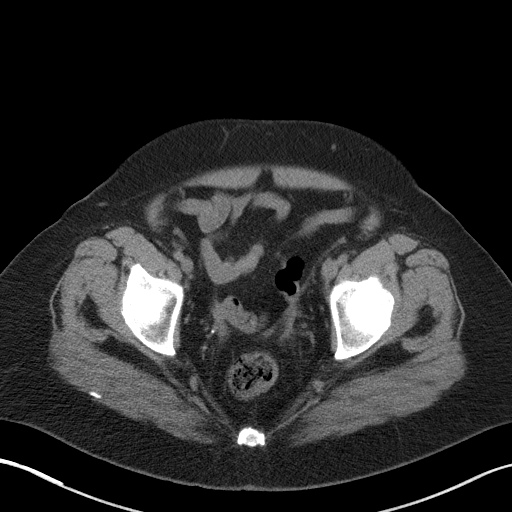
[im 22/85  soft-tissue]
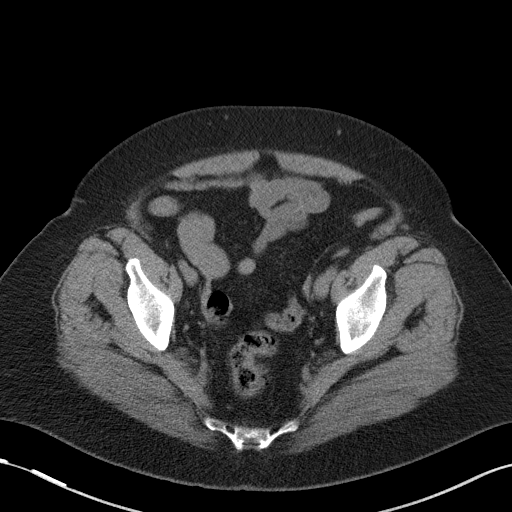
[im 30/85  soft-tissue]
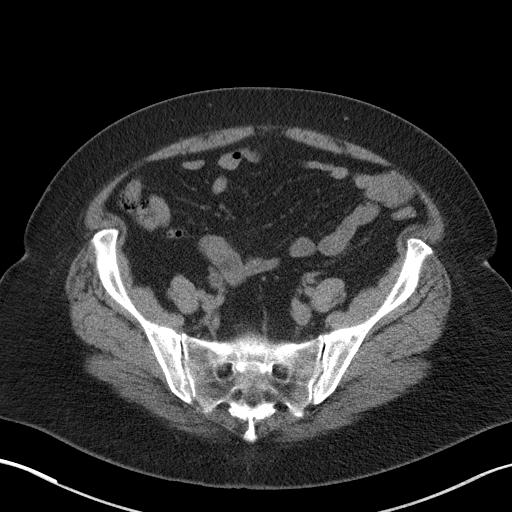
[im 37/85  soft-tissue]
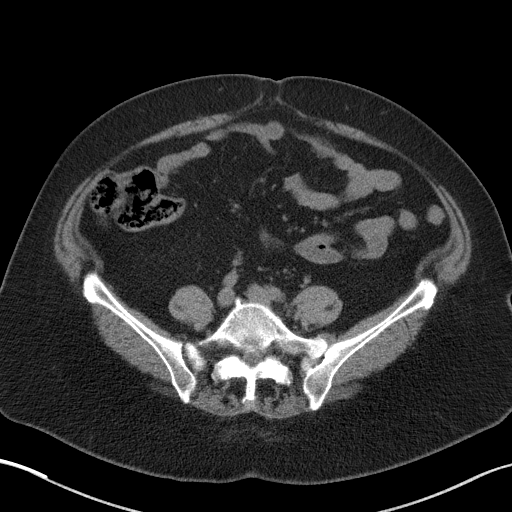
[im 44/85  soft-tissue]
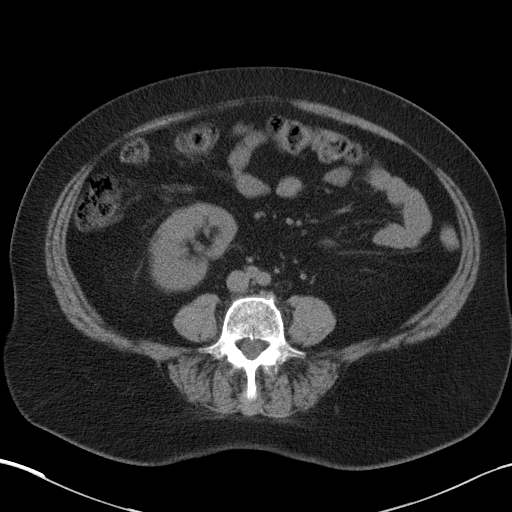
[im 48/85  soft-tissue]
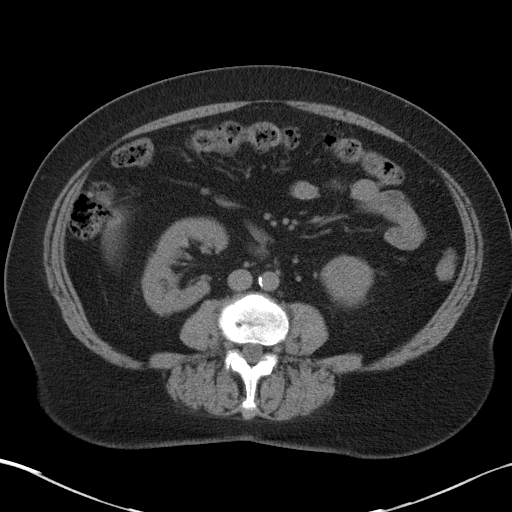
[im 55/85  soft-tissue]
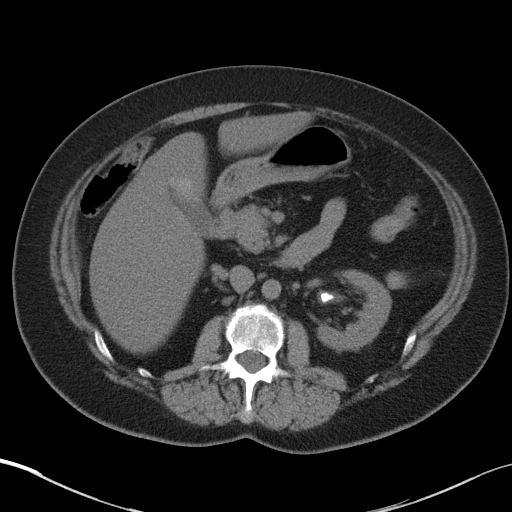
[im 55/85  bone]
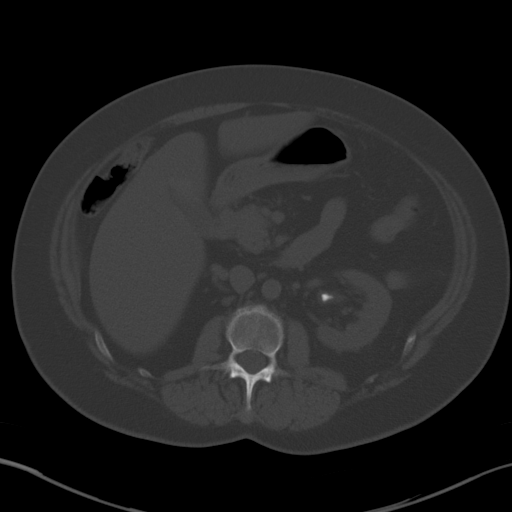
[im 63/85  soft-tissue]
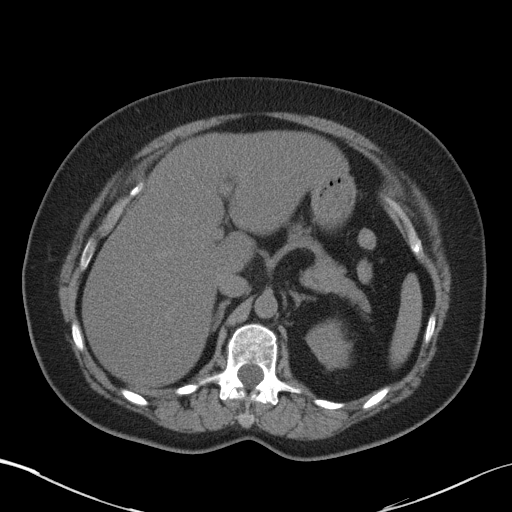
[im 66/85  soft-tissue]
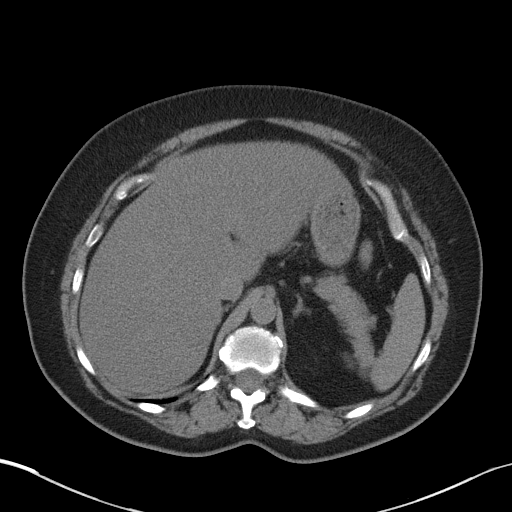
[im 74/85  soft-tissue]
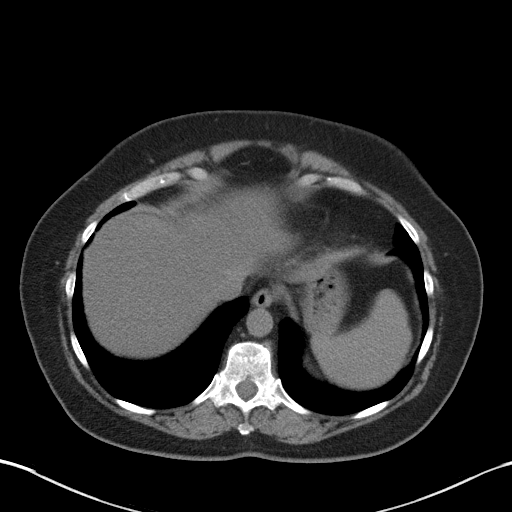
[im 81/85  soft-tissue]
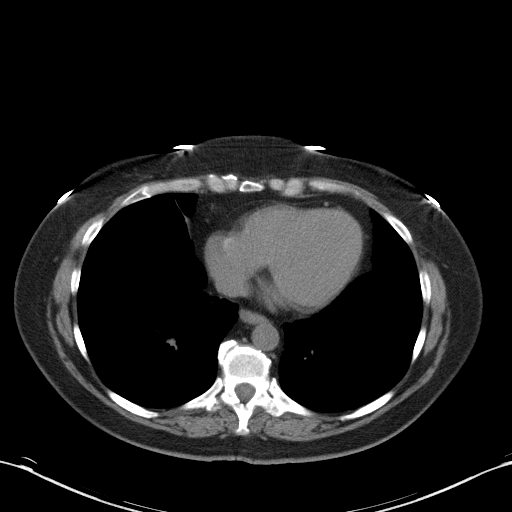

[Series 5: renal stone 3.0 coronal · coronal · 0.78mm/px · 3 of 106 slices shown]
[im 36/106  soft-tissue]
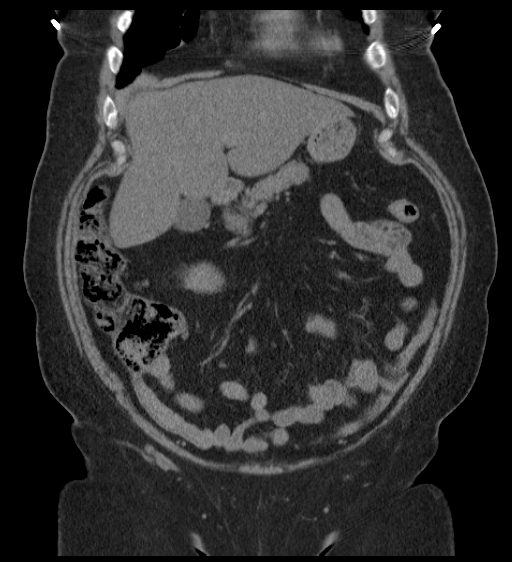
[im 47/106  soft-tissue]
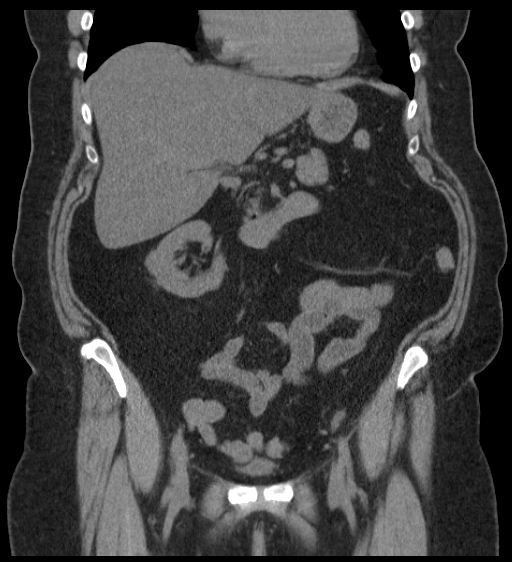
[im 59/106  soft-tissue]
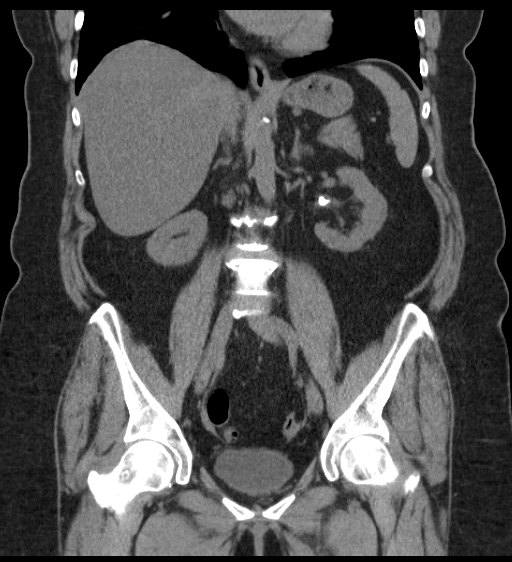

[16 of 46 positions shown; findings below may reference images not displayed]

FINDINGS: Lung Bases: Subsegmental atelectasis or scarring in the medial
segment of the right middle lobe and inferior segment of the
lingula.  4 mm right lower lobe nodule (image 6 of series 4).

Abdomen/Pelvis:  Mild diffuse decreased attenuation throughout the
hepatic parenchyma, consistent with mild hepatic steatosis (some
focal fatty sparing adjacent to the gallbladder fossa is noted).
The liver is otherwise unremarkable in appearance.  The unenhanced
appearance of the gallbladder, pancreas, spleen, bilateral adrenal
glands and right kidney is unremarkable.  In the left renal
collecting system there is a 9 mm calculus (image 31 of series 2).
No ureteral calculi are identified within either ureter.  No
calculus noted within the lumen of the urinary bladder.  The
patient is status post hysterectomy.  Ovaries are atrophic
bilaterally.  Normal appendix.  No ascites or pneumoperitoneum and
no pathologic distension of bowel.  No definite pathologic
adenopathy noted within the abdomen or pelvis.

Musculoskeletal: There are no aggressive appearing lytic or blastic
lesions noted in the visualized portions of the skeleton.
IMPRESSION: 1.  9 mm nonobstructive calculus in the left renal collecting
system.
2.  Mild hepatic steatosis.
3.  4 mm pulmonary nodule in the right lower lobe. If the patient
is at high risk for bronchogenic carcinoma, follow-up chest CT at 1
year is recommended.  If the patient is at low risk, no follow-up
is needed.  This recommendation follows the consensus statement:
Guidelines for Management of Small Pulmonary Nodules Detected on CT
Scans:  A Statement from the [HOSPITAL] as published in
4.  Status post hysterectomy.

## 2013-04-24 ENCOUNTER — Other Ambulatory Visit: Payer: Self-pay

## 2013-04-29 DIAGNOSIS — H2510 Age-related nuclear cataract, unspecified eye: Secondary | ICD-10-CM | POA: Diagnosis not present

## 2013-05-05 ENCOUNTER — Encounter: Payer: Self-pay | Admitting: Family Medicine

## 2013-05-05 ENCOUNTER — Ambulatory Visit (INDEPENDENT_AMBULATORY_CARE_PROVIDER_SITE_OTHER): Payer: Medicare Other | Admitting: Family Medicine

## 2013-05-05 VITALS — BP 130/70 | HR 84 | Temp 98.1°F | Resp 16 | Wt 177.4 lb

## 2013-05-05 DIAGNOSIS — E781 Pure hyperglyceridemia: Secondary | ICD-10-CM

## 2013-05-05 DIAGNOSIS — E119 Type 2 diabetes mellitus without complications: Secondary | ICD-10-CM

## 2013-05-05 DIAGNOSIS — G4700 Insomnia, unspecified: Secondary | ICD-10-CM

## 2013-05-05 MED ORDER — TRAZODONE HCL 50 MG PO TABS
25.0000 mg | ORAL_TABLET | Freq: Every evening | ORAL | Status: DC | PRN
Start: 2013-05-05 — End: 2013-05-16

## 2013-05-05 NOTE — Progress Notes (Signed)
  Subjective:    Patient ID: [name], female    DOB: Nov 24, 1940, 72 y.o.   MRN: [id number]  HPI Pre visit review using our clinic review tool, if applicable. No additional management support is needed unless otherwise documented below in the visit note.   Insomnia- pt has been taking Melatonin and OTC sleep aides w/out relief.  'i am just not sleeping, and it's getting worse and worse'.  Pt has been using ETOH to fall asleep and this causes HA.  Difficulty both falling asleep and staying asleep.  DM- overdue for A1C.  On Victoza.  UTD on eye exam.  Denies symptomatic lows.  No CP, SOB, HAs, visual changes, edema.  Hyperlipidemia- chronic problem, on Tricor.  Denies abd pain, N/V, myalgias.   Review of Systems For ROS see HPI     Objective:   Physical Exam  Vitals reviewed. Constitutional: She is oriented to person, place, and time. She appears well-developed and well-nourished. No distress.  HENT:  Head: Normocephalic and atraumatic.  Eyes: Conjunctivae and EOM are normal. Pupils are equal, round, and reactive to light.  Neck: Normal range of motion. Neck supple. No thyromegaly present.  Cardiovascular: Normal rate, regular rhythm, normal heart sounds and intact distal pulses.   No murmur heard. Pulmonary/Chest: Effort normal and breath sounds normal. No respiratory distress.  Abdominal: Soft. She exhibits no distension. There is no tenderness.  Musculoskeletal: She exhibits no edema.  Lymphadenopathy:    She has no cervical adenopathy.  Neurological: She is alert and oriented to person, place, and time.  Skin: Skin is warm and dry.  Psychiatric: She has a normal mood and affect. Her behavior is normal.          Assessment & Plan:

## 2013-05-05 NOTE — Patient Instructions (Signed)
Schedule your complete physical in 6 months We'll notify you of your lab results and make any changes if needed Start the Trazodone as needed for sleep- start w/ 1/2 tab and increase to full tab if needed Keep up the good work!  You look great! Call with any questions or concerns Happy Thanksgiving!

## 2013-05-06 LAB — HEPATIC FUNCTION PANEL
ALT: 28 U/L (ref 0–35)
Bilirubin, Direct: 0.1 mg/dL (ref 0.0–0.3)
Total Protein: 7.5 g/dL (ref 6.0–8.3)

## 2013-05-06 LAB — BASIC METABOLIC PANEL
CO2: 26 mEq/L (ref 19–32)
Calcium: 10.1 mg/dL (ref 8.4–10.5)
Creatinine, Ser: 0.9 mg/dL (ref 0.4–1.2)
Glucose, Bld: 91 mg/dL (ref 70–99)

## 2013-05-06 LAB — LDL CHOLESTEROL, DIRECT: Direct LDL: 113 mg/dL

## 2013-05-06 LAB — LIPID PANEL
Cholesterol: 160 mg/dL (ref 0–200)
Triglycerides: 232 mg/dL — ABNORMAL HIGH (ref 0.0–149.0)

## 2013-05-07 NOTE — Assessment & Plan Note (Signed)
New.  Cautioned against self medicating w/ ETOH.  Will start Trazodone.  Monitor for sxs improvement.

## 2013-05-07 NOTE — Assessment & Plan Note (Signed)
Chronic problem.  Typically well controlled.  Asymptomatic.  Check labs.  Adjust meds prn

## 2013-05-07 NOTE — Assessment & Plan Note (Signed)
Chronic problem, on fenofibrate.  Check labs.  Adjust meds prn

## 2013-05-09 ENCOUNTER — Encounter: Payer: Self-pay | Admitting: Family Medicine

## 2013-05-09 ENCOUNTER — Ambulatory Visit (INDEPENDENT_AMBULATORY_CARE_PROVIDER_SITE_OTHER): Payer: Medicare Other | Admitting: Family Medicine

## 2013-05-09 VITALS — BP 142/82 | HR 107 | Temp 99.1°F | Resp 17 | Wt 172.2 lb

## 2013-05-09 DIAGNOSIS — R6889 Other general symptoms and signs: Secondary | ICD-10-CM | POA: Diagnosis not present

## 2013-05-09 DIAGNOSIS — J019 Acute sinusitis, unspecified: Secondary | ICD-10-CM

## 2013-05-09 LAB — POCT INFLUENZA A/B
Influenza A, POC: NEGATIVE
Influenza B, POC: NEGATIVE

## 2013-05-09 MED ORDER — PROMETHAZINE-DM 6.25-15 MG/5ML PO SYRP: 5.0000 mL | ORAL_SOLUTION | Freq: Four times a day (QID) | ORAL | Status: DC | PRN

## 2013-05-09 MED ORDER — AMOXICILLIN 875 MG PO TABS: 875.0000 mg | ORAL_TABLET | Freq: Two times a day (BID) | ORAL | Status: DC

## 2013-05-09 NOTE — Progress Notes (Signed)
  Subjective:    Patient ID: [name], female    DOB: 1941/02/06, 72 y.o.   MRN: [id number]  HPI Pre visit review using our clinic review tool, if applicable. No additional management support is needed unless otherwise documented below in the visit note.   URI- sxs started Wednesday w/ cold sxs.  + nasal congestion, fatigue.  No body aches.  Sore throat, eyes burn, HA.  No ear pain.  Some sinus pressure.  + cough- productive.  Subjective fevers.  No N/V/D.   Review of Systems For ROS see HPI     Objective:   Physical Exam  Vitals reviewed. Constitutional: She appears well-developed and well-nourished. No distress.  HENT:  Head: Normocephalic and atraumatic.  Right Ear: Tympanic membrane normal.  Left Ear: Tympanic membrane normal.  Nose: Mucosal edema and rhinorrhea present. Right sinus exhibits maxillary sinus tenderness. Right sinus exhibits no frontal sinus tenderness. Left sinus exhibits maxillary sinus tenderness. Left sinus exhibits no frontal sinus tenderness.  Mouth/Throat: Uvula is midline and mucous membranes are normal. Posterior oropharyngeal erythema present. No oropharyngeal exudate.  Eyes: Conjunctivae and EOM are normal. Pupils are equal, round, and reactive to light.  Neck: Normal range of motion. Neck supple.  Cardiovascular: Normal rate, regular rhythm and normal heart sounds.   Pulmonary/Chest: Effort normal and breath sounds normal. No respiratory distress. She has no wheezes.  Lymphadenopathy:    She has no cervical adenopathy.          Assessment & Plan:

## 2013-05-09 NOTE — Assessment & Plan Note (Signed)
Pt's sxs and PE consistent w/ infxn.  Start abx.  Cough meds prn.  Reviewed supportive care and red flags that should prompt return.  Pt expressed understanding and is in agreement w/ plan.  

## 2013-05-09 NOTE — Patient Instructions (Signed)
Follow up as needed This is a sinus infection- start the Amoxicillin twice daily Drink plenty of fluids Use the cough syrup for nights and weekends (will cause drowsiness) REST! Call with any questions or concerns Hang in there!! Happy Holidays!

## 2013-05-12 ENCOUNTER — Encounter: Payer: Self-pay | Admitting: Family Medicine

## 2013-05-12 ENCOUNTER — Ambulatory Visit (INDEPENDENT_AMBULATORY_CARE_PROVIDER_SITE_OTHER): Payer: Medicare Other | Admitting: Family Medicine

## 2013-05-12 VITALS — BP 138/82 | HR 101 | Temp 98.6°F | Resp 16

## 2013-05-12 DIAGNOSIS — J42 Unspecified chronic bronchitis: Secondary | ICD-10-CM | POA: Diagnosis not present

## 2013-05-12 DIAGNOSIS — J019 Acute sinusitis, unspecified: Secondary | ICD-10-CM

## 2013-05-12 MED ORDER — PREDNISONE 10 MG PO TABS: ORAL_TABLET | ORAL | Status: DC

## 2013-05-12 MED ORDER — IPRATROPIUM BROMIDE 0.02 % IN SOLN
0.5000 mg | Freq: Once | RESPIRATORY_TRACT | Status: AC
  Administered 2013-05-12: 0.5 mg via RESPIRATORY_TRACT

## 2013-05-12 MED ORDER — ALBUTEROL SULFATE HFA 108 (90 BASE) MCG/ACT IN AERS: 2.0000 | INHALATION_SPRAY | RESPIRATORY_TRACT | Status: DC | PRN

## 2013-05-12 MED ORDER — CLARITHROMYCIN ER 500 MG PO TB24: 1000.0000 mg | ORAL_TABLET | Freq: Every day | ORAL | Status: DC

## 2013-05-12 MED ORDER — METHYLPREDNISOLONE ACETATE 80 MG/ML IJ SUSP
80.0000 mg | Freq: Once | INTRAMUSCULAR | Status: AC
  Administered 2013-05-12: 80 mg via INTRAMUSCULAR

## 2013-05-12 MED ORDER — ALBUTEROL SULFATE (5 MG/ML) 0.5% IN NEBU
2.5000 mg | INHALATION_SOLUTION | Freq: Once | RESPIRATORY_TRACT | Status: AC
  Administered 2013-05-12: 2.5 mg via RESPIRATORY_TRACT

## 2013-05-12 NOTE — Progress Notes (Signed)
  Subjective:    Patient ID: [name], female    DOB: [birth year], 72 y.o.   MRN: [id number]  HPI Pre visit review using our clinic review tool, if applicable. No additional management support is needed unless otherwise documented below in the visit note.  Sinusitis- started Amox last week (3 days ago) for sinus infxn.  Increased fatigue- sleeping nearly all day.  Alternating sweats and chills.  Cough is now painful, 'my breathing is restricted'.  Hx of PNA w/ URI.  Pt admits to SOB, increased wheezing, chest tightness  Insomnia- pt reports Trazodone kept her awake rather than help her sleep.  Bought OTC sleep aid w/ better results.   Review of Systems For ROS see HPI     Objective:   Physical Exam  Vitals reviewed. Constitutional: She is oriented to person, place, and time. She appears well-developed and well-nourished. No distress.  HENT:  Head: Normocephalic and atraumatic.  TMs normal bilaterally Mild nasal congestion Throat w/out erythema, edema, or exudate  Eyes: Conjunctivae and EOM are normal. Pupils are equal, round, and reactive to light.  Neck: Normal range of motion. Neck supple.  Cardiovascular: Normal rate, regular rhythm, normal heart sounds and intact distal pulses.   No murmur heard. Pulmonary/Chest: Effort normal. No respiratory distress. She has wheezes (diffuse inspiratory/expiratory wheezes s/p neb tx).  + hacking cough Poor air movement diffusely- improved s/p neb tx w/ increased wheezing No obvious crackles but coarse BS diffusely w/ SOB  Lymphadenopathy:    She has no cervical adenopathy.  Neurological: She is alert and oriented to person, place, and time.          Assessment & Plan:

## 2013-05-12 NOTE — Patient Instructions (Signed)
Follow up as needed STOP the Amoxicillin START the Biaxin twice daily Continue the cough syrup Drink plenty of fluids REST! Hang in there!!

## 2013-05-13 NOTE — Assessment & Plan Note (Signed)
Deteriorated.  Pt now w/ wheezing, coarse BS, and obvious SOB.  Start pred taper after depo-medrol shot in office.  Change abx to stronger Biaxin.  Albuterol prn.  Reviewed supportive care and red flags that should prompt return.  Pt expressed understanding and is in agreement w/ plan.

## 2013-05-13 NOTE — Assessment & Plan Note (Signed)
Unchanged.  Will switch abx to cover deteriorating respiratory picture (see below)

## 2013-05-16 ENCOUNTER — Ambulatory Visit (INDEPENDENT_AMBULATORY_CARE_PROVIDER_SITE_OTHER): Payer: Medicare Other | Admitting: Internal Medicine

## 2013-05-16 ENCOUNTER — Encounter (HOSPITAL_COMMUNITY): Payer: Self-pay | Admitting: Emergency Medicine

## 2013-05-16 ENCOUNTER — Inpatient Hospital Stay (HOSPITAL_COMMUNITY)
Admission: EM | Admit: 2013-05-16 | Discharge: 2013-05-19 | DRG: 202 | Disposition: A | Discharge status: Home / Self Care | Payer: Medicare Other | Attending: Internal Medicine | Admitting: Internal Medicine

## 2013-05-16 ENCOUNTER — Emergency Department (HOSPITAL_COMMUNITY): Payer: Medicare Other

## 2013-05-16 ENCOUNTER — Encounter: Payer: Self-pay | Admitting: Internal Medicine

## 2013-05-16 VITALS — BP 152/92 | HR 93 | Temp 98.0°F | Resp 18 | Wt 172.0 lb

## 2013-05-16 DIAGNOSIS — J309 Allergic rhinitis, unspecified: Secondary | ICD-10-CM | POA: Diagnosis not present

## 2013-05-16 DIAGNOSIS — K2900 Acute gastritis without bleeding: Secondary | ICD-10-CM | POA: Diagnosis not present

## 2013-05-16 DIAGNOSIS — IMO0001 Reserved for inherently not codable concepts without codable children: Secondary | ICD-10-CM | POA: Diagnosis present

## 2013-05-16 DIAGNOSIS — E781 Pure hyperglyceridemia: Secondary | ICD-10-CM | POA: Diagnosis present

## 2013-05-16 DIAGNOSIS — J45909 Unspecified asthma, uncomplicated: Secondary | ICD-10-CM | POA: Diagnosis present

## 2013-05-16 DIAGNOSIS — J449 Chronic obstructive pulmonary disease, unspecified: Secondary | ICD-10-CM

## 2013-05-16 DIAGNOSIS — E785 Hyperlipidemia, unspecified: Secondary | ICD-10-CM | POA: Diagnosis present

## 2013-05-16 DIAGNOSIS — J9819 Other pulmonary collapse: Secondary | ICD-10-CM | POA: Diagnosis not present

## 2013-05-16 DIAGNOSIS — R0602 Shortness of breath: Secondary | ICD-10-CM | POA: Diagnosis not present

## 2013-05-16 DIAGNOSIS — E119 Type 2 diabetes mellitus without complications: Secondary | ICD-10-CM | POA: Diagnosis not present

## 2013-05-16 DIAGNOSIS — J209 Acute bronchitis, unspecified: Secondary | ICD-10-CM | POA: Diagnosis not present

## 2013-05-16 DIAGNOSIS — R0902 Hypoxemia: Secondary | ICD-10-CM

## 2013-05-16 DIAGNOSIS — J4540 Moderate persistent asthma, uncomplicated: Secondary | ICD-10-CM | POA: Insufficient documentation

## 2013-05-16 DIAGNOSIS — J45901 Unspecified asthma with (acute) exacerbation: Secondary | ICD-10-CM

## 2013-05-16 DIAGNOSIS — Z8701 Personal history of pneumonia (recurrent): Secondary | ICD-10-CM

## 2013-05-16 DIAGNOSIS — J42 Unspecified chronic bronchitis: Secondary | ICD-10-CM | POA: Diagnosis not present

## 2013-05-16 DIAGNOSIS — K2970 Gastritis, unspecified, without bleeding: Secondary | ICD-10-CM | POA: Diagnosis not present

## 2013-05-16 LAB — CREATININE, SERUM
GFR calc Af Amer: >90 mL/min (ref >90)
GFR calc non Af Amer: 82 mL/min — ABNORMAL LOW (ref >90)

## 2013-05-16 LAB — CBC
HCT: 39.6 % (ref 36.0–46.0)
HCT: 37.5 % (ref 36.0–46.0)
Hemoglobin: 13.1 g/dL (ref 12.0–15.0)
MCH: 29.8 pg (ref 26.0–34.0)
MCH: 29.8 pg (ref 26.0–34.0)
MCHC: 34.9 g/dL (ref 30.0–36.0)
MCV: 83.7 fL (ref 78.0–100.0)
Platelets: 249 10*3/uL (ref 150–400)
Platelets: 299 10*3/uL (ref 150–400)
RBC: 4.73 MIL/uL (ref 3.87–5.11)
RBC: 4.4 MIL/uL (ref 3.87–5.11)
RDW: 13.4 % (ref 11.5–15.5)
WBC: 9.1 10*3/uL (ref 4.0–10.5)
WBC: 9 10*3/uL (ref 4.0–10.5)

## 2013-05-16 LAB — BASIC METABOLIC PANEL
BUN: 16 mg/dL (ref 6–23)
CO2: 17 mEq/L — ABNORMAL LOW (ref 19–32)
Chloride: 95 mEq/L — ABNORMAL LOW (ref 96–112)
Creatinine, Ser: 0.72 mg/dL (ref 0.50–1.10)
GFR calc Af Amer: >90 mL/min (ref >90)
Sodium: 134 mEq/L — ABNORMAL LOW (ref 135–145)

## 2013-05-16 LAB — GLUCOSE, CAPILLARY

## 2013-05-16 LAB — POCT I-STAT TROPONIN I

## 2013-05-16 MED ORDER — LEVOFLOXACIN IN D5W 750 MG/150ML IV SOLN
750.0000 mg | INTRAVENOUS | Status: DC
  Administered 2013-05-17 – 2013-05-18 (×2): 750 mg via INTRAVENOUS
  Filled 2013-05-16 (×3): qty 150

## 2013-05-16 MED ORDER — FOLIC ACID 0.5 MG HALF TAB
0.5000 mg | ORAL_TABLET | Freq: Every day | ORAL | Status: DC
  Administered 2013-05-16 – 2013-05-19 (×4): 0.5 mg via ORAL
  Filled 2013-05-16 (×4): qty 1

## 2013-05-16 MED ORDER — IPRATROPIUM BROMIDE 0.02 % IN SOLN
0.5000 mg | Freq: Four times a day (QID) | RESPIRATORY_TRACT | Status: DC
  Administered 2013-05-17 – 2013-05-19 (×8): 0.5 mg via RESPIRATORY_TRACT
  Filled 2013-05-16 (×7): qty 2.5

## 2013-05-16 MED ORDER — ALUM & MAG HYDROXIDE-SIMETH 200-200-20 MG/5ML PO SUSP: 30.0000 mL | Freq: Four times a day (QID) | ORAL | Status: DC | PRN

## 2013-05-16 MED ORDER — LEVALBUTEROL HCL 0.63 MG/3ML IN NEBU: 0.6300 mg | INHALATION_SOLUTION | Freq: Four times a day (QID) | RESPIRATORY_TRACT | Status: DC | PRN

## 2013-05-16 MED ORDER — MAGNESIUM OXIDE 400 MG PO TABS: 400.0000 mg | ORAL_TABLET | Freq: Every day | ORAL | Status: DC

## 2013-05-16 MED ORDER — HYDROMORPHONE HCL PF 1 MG/ML IJ SOLN: 1.0000 mg | INTRAMUSCULAR | Status: DC | PRN

## 2013-05-16 MED ORDER — BUDESONIDE 0.25 MG/2ML IN SUSP
0.2500 mg | Freq: Two times a day (BID) | RESPIRATORY_TRACT | Status: DC
  Administered 2013-05-17 – 2013-05-19 (×5): 0.25 mg via RESPIRATORY_TRACT
  Filled 2013-05-16 (×8): qty 2

## 2013-05-16 MED ORDER — SODIUM CHLORIDE 0.9 % IJ SOLN
3.0000 mL | Freq: Two times a day (BID) | INTRAMUSCULAR | Status: DC
  Administered 2013-05-16 – 2013-05-18 (×4): 3 mL via INTRAVENOUS

## 2013-05-16 MED ORDER — PANTOPRAZOLE SODIUM 40 MG PO TBEC
40.0000 mg | DELAYED_RELEASE_TABLET | Freq: Two times a day (BID) | ORAL | Status: DC
  Administered 2013-05-16 – 2013-05-19 (×6): 40 mg via ORAL
  Filled 2013-05-16 (×6): qty 1

## 2013-05-16 MED ORDER — ALBUTEROL SULFATE (5 MG/ML) 0.5% IN NEBU
5.0000 mg | INHALATION_SOLUTION | Freq: Once | RESPIRATORY_TRACT | Status: AC
  Administered 2013-05-16: 5 mg via RESPIRATORY_TRACT

## 2013-05-16 MED ORDER — ENOXAPARIN SODIUM 40 MG/0.4ML ~~LOC~~ SOLN
40.0000 mg | SUBCUTANEOUS | Status: DC
  Administered 2013-05-16 – 2013-05-18 (×2): 40 mg via SUBCUTANEOUS
  Filled 2013-05-16 (×4): qty 0.4

## 2013-05-16 MED ORDER — DEXTROSE 5 % IV SOLN
500.0000 mg | Freq: Once | INTRAVENOUS | Status: AC
  Administered 2013-05-16: 500 mg via INTRAVENOUS

## 2013-05-16 MED ORDER — FOLIC ACID 400 MCG PO TABS
400.0000 ug | ORAL_TABLET | Freq: Every day | ORAL | Status: DC
Start: 2013-05-16 — End: 2013-05-16

## 2013-05-16 MED ORDER — LORATADINE 10 MG PO TABS
10.0000 mg | ORAL_TABLET | Freq: Every day | ORAL | Status: DC
  Administered 2013-05-16 – 2013-05-19 (×4): 10 mg via ORAL
  Filled 2013-05-16 (×4): qty 1

## 2013-05-16 MED ORDER — ONDANSETRON HCL 4 MG/2ML IJ SOLN: 4.0000 mg | Freq: Four times a day (QID) | INTRAMUSCULAR | Status: DC | PRN

## 2013-05-16 MED ORDER — GUAIFENESIN-DM 100-10 MG/5ML PO SYRP
5.0000 mL | ORAL_SOLUTION | ORAL | Status: DC | PRN
  Filled 2013-05-16: qty 5

## 2013-05-16 MED ORDER — ALBUTEROL SULFATE (5 MG/ML) 0.5% IN NEBU: 2.5000 mg | INHALATION_SOLUTION | RESPIRATORY_TRACT | Status: DC

## 2013-05-16 MED ORDER — ACETAMINOPHEN 650 MG RE SUPP: 650.0000 mg | Freq: Four times a day (QID) | RECTAL | Status: DC | PRN

## 2013-05-16 MED ORDER — ALBUTEROL SULFATE (5 MG/ML) 0.5% IN NEBU
2.5000 mg | INHALATION_SOLUTION | Freq: Once | RESPIRATORY_TRACT | Status: DC
  Filled 2013-05-16: qty 0.5

## 2013-05-16 MED ORDER — DEXTROSE 5 % IV SOLN
1.0000 g | Freq: Once | INTRAVENOUS | Status: AC
  Administered 2013-05-16: 1 g via INTRAVENOUS
  Filled 2013-05-16: qty 10

## 2013-05-16 MED ORDER — VENLAFAXINE HCL ER 75 MG PO CP24
75.0000 mg | ORAL_CAPSULE | Freq: Every day | ORAL | Status: DC
  Filled 2013-05-16: qty 1

## 2013-05-16 MED ORDER — IPRATROPIUM BROMIDE 0.02 % IN SOLN
0.5000 mg | RESPIRATORY_TRACT | Status: DC
  Administered 2013-05-16: 20:00:00 0.5 mg via RESPIRATORY_TRACT
  Filled 2013-05-16: qty 2.5

## 2013-05-16 MED ORDER — HYDROCODONE-ACETAMINOPHEN 5-325 MG PO TABS: 1.0000 | ORAL_TABLET | ORAL | Status: DC | PRN

## 2013-05-16 MED ORDER — ACETAMINOPHEN 325 MG PO TABS: 650.0000 mg | ORAL_TABLET | Freq: Four times a day (QID) | ORAL | Status: DC | PRN

## 2013-05-16 MED ORDER — MAGNESIUM OXIDE 400 (241.3 MG) MG PO TABS
400.0000 mg | ORAL_TABLET | Freq: Every day | ORAL | Status: DC
  Administered 2013-05-16 – 2013-05-19 (×4): 400 mg via ORAL
  Filled 2013-05-16 (×4): qty 1

## 2013-05-16 MED ORDER — ALBUTEROL SULFATE (5 MG/ML) 0.5% IN NEBU: 2.5000 mg | INHALATION_SOLUTION | RESPIRATORY_TRACT | Status: DC | PRN

## 2013-05-16 MED ORDER — INSULIN ASPART 100 UNIT/ML ~~LOC~~ SOLN: 0.0000 [IU] | Freq: Every day | SUBCUTANEOUS | Status: DC

## 2013-05-16 MED ORDER — LEVALBUTEROL HCL 0.63 MG/3ML IN NEBU
0.6300 mg | INHALATION_SOLUTION | Freq: Four times a day (QID) | RESPIRATORY_TRACT | Status: DC
  Administered 2013-05-17 – 2013-05-19 (×8): 0.63 mg via RESPIRATORY_TRACT
  Filled 2013-05-16 (×19): qty 3

## 2013-05-16 MED ORDER — METHYLPREDNISOLONE SODIUM SUCC 125 MG IJ SOLR
125.0000 mg | Freq: Once | INTRAMUSCULAR | Status: AC
  Administered 2013-05-16: 125 mg via INTRAVENOUS
  Filled 2013-05-16: qty 2

## 2013-05-16 MED ORDER — ALBUTEROL SULFATE (5 MG/ML) 0.5% IN NEBU
2.5000 mg | INHALATION_SOLUTION | RESPIRATORY_TRACT | Status: DC
  Administered 2013-05-16: 20:00:00 2.5 mg via RESPIRATORY_TRACT
  Filled 2013-05-16: qty 0.5

## 2013-05-16 MED ORDER — DIPHENHYDRAMINE HCL 25 MG PO CAPS
25.0000 mg | ORAL_CAPSULE | Freq: Once | ORAL | Status: AC
  Administered 2013-05-16: 23:00:00 25 mg via ORAL
  Filled 2013-05-16: qty 1

## 2013-05-16 MED ORDER — LATANOPROST 0.005 % OP SOLN
1.0000 [drp] | Freq: Every day | OPHTHALMIC | Status: DC
  Administered 2013-05-16 – 2013-05-18 (×2): 1 [drp] via OPHTHALMIC
  Filled 2013-05-16 (×2): qty 2.5

## 2013-05-16 MED ORDER — METHYLPREDNISOLONE SODIUM SUCC 125 MG IJ SOLR
60.0000 mg | Freq: Four times a day (QID) | INTRAMUSCULAR | Status: DC
  Administered 2013-05-16 – 2013-05-19 (×10): 60 mg via INTRAVENOUS
  Filled 2013-05-16 (×13): qty 0.96
  Filled 2013-05-16: qty 2
  Filled 2013-05-16: qty 0.96

## 2013-05-16 MED ORDER — MELATONIN 10 MG PO CAPS
1.0000 | ORAL_CAPSULE | Freq: Every day | ORAL | Status: DC
Start: 2013-05-16 — End: 2013-05-16

## 2013-05-16 MED ORDER — ONDANSETRON HCL 4 MG PO TABS: 4.0000 mg | ORAL_TABLET | Freq: Four times a day (QID) | ORAL | Status: DC | PRN

## 2013-05-16 MED ORDER — INSULIN ASPART 100 UNIT/ML ~~LOC~~ SOLN
8.0000 [IU] | Freq: Once | SUBCUTANEOUS | Status: AC
  Administered 2013-05-16: 8 [IU] via SUBCUTANEOUS

## 2013-05-16 MED ORDER — INSULIN ASPART 100 UNIT/ML ~~LOC~~ SOLN
0.0000 [IU] | Freq: Three times a day (TID) | SUBCUTANEOUS | Status: DC
  Administered 2013-05-17: 12:00:00 9 [IU] via SUBCUTANEOUS
  Administered 2013-05-17: 5 [IU] via SUBCUTANEOUS
  Administered 2013-05-18: 09:00:00 9 [IU] via SUBCUTANEOUS

## 2013-05-16 MED ORDER — IPRATROPIUM BROMIDE 0.02 % IN SOLN
0.5000 mg | RESPIRATORY_TRACT | Status: DC
Start: 2013-05-16 — End: 2013-05-16

## 2013-05-16 MED ORDER — FENOFIBRATE 160 MG PO TABS
160.0000 mg | ORAL_TABLET | Freq: Every day | ORAL | Status: DC
  Administered 2013-05-16 – 2013-05-19 (×4): 160 mg via ORAL
  Filled 2013-05-16 (×4): qty 1

## 2013-05-16 NOTE — Progress Notes (Signed)
Subjective:    Patient ID: [name], female    DOB: [birth year], 72 y.o.   MRN: [id number]  HPI Despite the weaning dose of prednisone ( presently 20 mg daily as of today), Biaxin, and albuterol metered-dose inhaler she continues to have a significant productive cough associated with shortness breath and wheezing. This is her 5th physician visit.Today she exhibited paroxysmal nocturnal dyspnea which required her to get out of bed. She stayed in a hot shower to help her respiratory issues with marginal response.  She states that she coughs up a teaspoon of yellow sputum up to 20 times a day.  She has a long-standing history of asthma which manifested itself as bronchitis as a child. She's had pneumonia on multiple occasions, initially at age 78. Her father had a similar history  Her smoking history is insignificant.    Review of Systems  She denies any significant extrinsic symptoms of itchy, watery eyes, or sneezing.  She also has no rhinosinusitis symptoms of frontal sinus pain, maxillary sinus pain, dental pain, sore throat, or nasal purulence.  She is not having fever, chills, or sweats.  Since she's been on the weaning doses of steroids; she's not been checking her sugars. Her most recent A1c 05/05/13 exhibited good control with an A1c of 6.4%.     Objective:   Physical Exam Gen.:  well-nourished in appearance. Alert, appropriate and cooperative throughout exam. Obvious respiratory compromise.  Eyes: No corneal or conjunctival inflammation noted.  Ears: External  ear exam reveals no significant lesions or deformities. Canals clear .TMs normal. Hearing is grossly normal bilaterally. Nose: External nasal exam reveals no deformity or inflammation. Nasal mucosa are dry  With scant exudate. No lesions or exudates noted.   Mouth: Oral mucosa and oropharynx reveal no lesions or exudates. Teeth in good repair. Neck: No deformities, masses, or tenderness noted. Audible upper airway  rhonchorous sounds. No NVD @10  degrees Lungs: Increased respiratory effort; decreased inspiratory capacity.Chest sounds asymmetric, louder on R than L with diffuse coarse rhonchi & musical  wheezes. Heart: Normal rate and rhythm. Normal S1 and S2. Slight gallop, click, or rub. Breath sounds obscure heart sounds. Abdomen: Bowel sounds normal; abdomen soft and nontender. No masses, organomegaly or hernias noted. No HJR                                  Musculoskeletal/extremities:  No clubbing, cyanosis, edema, or significant extremity  deformity noted. Tone & strength normal. Hand joints normal . Fake fingernails. Able to lie down & sit up w/o help.  Vascular: Carotid, radial artery, dorsalis pedis and  posterior tibial pulses are full and equal. No bruits present. Homan's negative bilaterally Neurologic: Alert and oriented x3.        Skin: Intact without suspicious lesions or rashes. Lymph: No cervical, axillary lymphadenopathy present. Psych: Mood and affect are normal. Normally interactive                                                                                        Assessment & Plan:  See Current  Assessment & Plan in Problem List under specific Diagnosis

## 2013-05-16 NOTE — ED Notes (Signed)
Phlebotomy at bedside.

## 2013-05-16 NOTE — ED Notes (Signed)
Onset today 0500 shortness of breath took steam shower no relief.  Seen Doctor who gave one treatment of albuterol and called EMS who started IV left ac 20g gave solumedrol 125mg  IVP and 10mg  albuterol and 1mg  Atrovent. Stated feels better.

## 2013-05-16 NOTE — Patient Instructions (Addendum)
Share records with ER medical staff . [facility] Triage Nurse called

## 2013-05-16 NOTE — ED Provider Notes (Signed)
CSN: [id number]     Arrival date & time 05/16/13  1316 History   First MD Initiated Contact with Patient 05/16/13 1320     Chief Complaint  Patient presents with  . Shortness of Breath   (Consider location/radiation/quality/duration/timing/severity/associated sxs/prior Treatment) HPI This is a 72 year old female with history of recurrent pneumonia and respiratory arrest who presents today after multiple days of worsening dyspnea. She was seen in her primary care office on several occasions and had been placed on an inhaler, Biaxin, and steroids several days ago. She returned to them for reevaluation today due to worsening dyspnea. There she was noted to have rales at her right base, oxygen saturations that were 88% on nasal cannula and after nebulizer. She was subsequently transferred here due to her respiratory status. She states that she has had URI type symptoms for a week with nasal congestion, productive cough, and subjective fever and chills. She does not have a nebulizer at home and had not been using it until she was given this in her doctor's office several days ago but has been using her inhaler multiple times per day since that time. She is not a smoker. She states that her previous respiratory arrest occurred abruptly and she required resuscitation in the emergency department however this was her prior residence in [place] I do not have record availability here Past Medical History  Diagnosis Date  . Allergic rhinitis   . Diabetes mellitus   . Urinary tract infection   . Migraines   . Palpitations   . Asthma   . Hyperlipemia   . Kidney stone   . Glaucoma    Past Surgical History  Procedure Laterality Date  . Rotator cuff repair      right  . Tonsillectomy    . Abdominal hysterectomy      Has ovaries  . Deviated septum repair    . Lithotripsy    . Back surgery  1987    lumb  . Colonoscopy    . Closed reduction nasal fracture  04/29/2012    Procedure: CLOSED REDUCTION  NASAL FRACTURE;  Surgeon: Flo Shanks, MD;  Location: [facility];  Service: ENT;  Laterality: N/A;  WITH STABILIZATION   Family History  Problem Relation Age of Onset  . Diabetes Father   . Colon cancer Neg Hx   . Esophageal cancer Neg Hx   . Rectal cancer Neg Hx   . Stomach cancer Neg Hx   . Breast cancer Neg Hx    History  Substance Use Topics  . Smoking status: Former Smoker    Types: Cigarettes  . Smokeless tobacco: Never Used     Comment: smoked in college years ago  . Alcohol Use: 0.0 oz/week     Comment: 1-2 glasses a night variety beer   OB History   Grav Para Term Preterm Abortions TAB SAB Ect Mult Living   2 2             Review of Systems  All other systems reviewed and are negative.    Allergies  Papain; Papaya derivatives; Mushroom ext cmplx(shiitake-reishi-mait); Penicillins; and Venlafaxine  Home Medications   Current Outpatient Rx  Name  Route  Sig  Dispense  Refill  . albuterol (PROAIR HFA) 108 (90 BASE) MCG/ACT inhaler   Inhalation   Inhale 2 puffs into the lungs every 4 (four) hours as needed for wheezing or shortness of breath.   1 Inhaler   6   . b  complex vitamins capsule   Oral   Take 1 capsule by mouth daily.          . bimatoprost (LUMIGAN) 0.03 % ophthalmic solution   Left Eye   Place 1 drop into the left eye at bedtime.          . Biotin 5000 MCG CAPS   Oral   Take 1 capsule by mouth daily.         . cetirizine (ZYRTEC) 10 MG tablet   Oral   Take 1 tablet (10 mg total) by mouth daily.   30 tablet   6   . clarithromycin (BIAXIN XL) 500 MG 24 hr tablet   Oral   Take 2 tablets (1,000 mg total) by mouth daily.   20 tablet   0   . EFFEXOR XR 75 MG 24 hr capsule      TAKE ONE (1) CAPSULE EACH DAY   30 each   11     Dispense as written.   . fenofibrate (TRICOR) 145 MG tablet   Oral   Take 1 tablet (145 mg total) by mouth daily after breakfast.   30 tablet   5   . folic acid (FOLVITE) 400 MCG  tablet   Oral   Take 400 mcg by mouth daily.         . Insulin Pen Needle 32G X 6 MM MISC   Does not apply   1 each by Does not apply route daily. To inject insulin.   50 each   12     DX. 250.00   . Liraglutide (VICTOZA) 18 MG/3ML SOPN   Subcutaneous   Inject 1.2 mg into the skin daily.   9 mL   6   . magnesium oxide (MAG-OX) 400 MG tablet   Oral   Take 400 mg by mouth daily.         . Melatonin 10 MG CAPS   Oral   Take 1 capsule by mouth at bedtime.   30 capsule   6   . OVER THE COUNTER MEDICATION   Oral   Take 1 tablet by mouth 2 (two) times daily. Glucosamine 1500mg  chondroitin 1200mg          . oxyCODONE-acetaminophen (TYLOX) 5-500 MG per capsule               . predniSONE (DELTASONE) 10 MG tablet      3 tabs x3 days and then 2 tabs x3 days and then 1 tab x3 days.  Take w/ food.   18 tablet   0   . promethazine-dextromethorphan (PROMETHAZINE-DM) 6.25-15 MG/5ML syrup   Oral   Take 5 mLs by mouth 4 (four) times daily as needed.   240 mL   0   . traZODone (DESYREL) 50 MG tablet   Oral   Take 0.5-1 tablets (25-50 mg total) by mouth at bedtime as needed for sleep.   30 tablet   3   . VITAMIN D, CHOLECALCIFEROL, PO   Oral   Take 1,000 Units by mouth daily.           BP 161/69  Pulse 102  Temp(Src) 98 F (36.7 C) (Oral)  Resp 22  SpO2 100% Physical Exam  Nursing note and vitals reviewed. Constitutional: She is oriented to person, place, and time. She appears well-developed and well-nourished.  Patient has mask in place and does not appear to be in respiratory distress. She is awake and alert and interactive with examiner.  HENT:  Head: Normocephalic and atraumatic.  Right Ear: External ear normal.  Left Ear: External ear normal.  Nose: Nose normal.  Mouth/Throat: Oropharynx is clear and moist.  Eyes: Conjunctivae and EOM are normal. Pupils are equal, round, and reactive to light.  Neck: Normal range of motion. Neck supple.   Cardiovascular: Tachycardia present.   Pulmonary/Chest:  Diffuse coarse breath sounds with bilateral basilar rhonchi and some and expiratory wheezes noted  Abdominal: Soft. Bowel sounds are normal.  Musculoskeletal: Normal range of motion.  Neurological: She is alert and oriented to person, place, and time.  Skin: Skin is warm and dry.  Psychiatric: She has a normal mood and affect. Her behavior is normal. Judgment and thought content normal.    ED Course  Procedures (including critical care time) Labs Review Labs Reviewed  BASIC METABOLIC PANEL  CBC   Imaging Review No results found.  EKG Interpretation    Date/Time:  Friday May 16 2013 13:22:58 EST Ventricular Rate:  99 PR Interval:  153 QRS Duration: 85 QT Interval:  372 QTC Calculation: 477 R Axis:   44 Text Interpretation:  Sinus rhythm Non-specific ST-t changes unchanged from first prior ekg of 04/29/12 Confirmed by Sandra Tellefsen MD, Chantavia Bazzle ([id number]) on 05/16/2013 1:31:54 PM            MDM  72 year old female history of asthma and rest rest it comes in today with several day history of upper respiratory symptoms, cough, and worsening dyspnea. She appears to be hemodynamically stable here and although tachypneic, does not appear to have any increased work of breathing. Her saturations have remained stable here. I discussed the patient's care with Dr. Isidoro Donning and plan admission to telemetry bed.    Hilario Quarry, MD 05/16/13 [id number]

## 2013-05-16 NOTE — H&P (Signed)
History and Physical       Hospital Admission Note Date: 05/16/2013  Patient name: [name] Medical record number: [id number] Date of birth: 07/19/1940 Age: 72 y.o. Gender: female PCP: Neena Rhymes, MD    Chief Complaint:  Shortness of breath with wheezing for last 1week  HPI: Patient is 72 year old female with history of pneumonia, asthma, history of prior respiratory arrest in [place]  (relocated to [place] 3 years ago), presented to the ER with above complaints. Patient reports that she has been having shortness of breath with wheezing, low-grade fevers, productive cough for last one week. Patient went to her primary care physician who placed her on antibiotics. Patient reports that she continued to get worse and Friday (a week ago), she went back to her PCP.  Patient was noted to have hypoxia with O2 sats 88% and was treated with nebulizer and placed on prednisone. Patient states that she started feeling a little bit better however this morning she woke up and couldn't breathe.   Review of Systems:  Constitutional: + fever, chills, diaphoresis, poor appetite and fatigue.  HEENT: Denies photophobia, eye pain, redness, hearing loss, ear pain,+ congestion, +sore throat, rhinorrhea, sneezing, mouth sores, trouble swallowing, neck pain, neck stiffness and tinnitus.   Respiratory please see history of present illness  Cardiovascular: Denies chest pain, palpitations and leg swelling.  Gastrointestinal: Denies nausea, vomiting, abdominal pain, diarrhea, constipation, blood in stool and abdominal distention.  Genitourinary: Denies dysuria, urgency, frequency, hematuria, flank pain and difficulty urinating.  Musculoskeletal: Denies myalgias, back pain, joint swelling, arthralgias and gait problem.  Skin: Denies pallor, rash and wound.  Neurological: Denies dizziness, seizures, syncope, weakness, light-headedness, numbness and  headaches.  Hematological: Denies adenopathy. Easy bruising, personal or family bleeding history  Psychiatric/Behavioral: Denies suicidal ideation, mood changes, confusion, nervousness, sleep disturbance and agitation  Past Medical History: Past Medical History  Diagnosis Date  . Allergic rhinitis   . Diabetes mellitus   . Urinary tract infection   . Migraines   . Palpitations   . Asthma   . Hyperlipemia   . Kidney stone   . Glaucoma    Past Surgical History  Procedure Laterality Date  . Rotator cuff repair      right  . Tonsillectomy    . Abdominal hysterectomy      Has ovaries  . Deviated septum repair    . Lithotripsy    . Back surgery  1987    lumb  . Colonoscopy    . Closed reduction nasal fracture  04/29/2012    Procedure: CLOSED REDUCTION NASAL FRACTURE;  Surgeon: Flo Shanks, MD;  Location: [facility];  Service: ENT;  Laterality: N/A;  WITH STABILIZATION    Medications: Prior to Admission medications   Medication Sig Start Date End Date Taking? Authorizing Provider  albuterol (PROAIR HFA) 108 (90 BASE) MCG/ACT inhaler Inhale 2 puffs into the lungs every 4 (four) hours as needed for wheezing or shortness of breath. 05/12/13  Yes Sheliah Hatch, MD  b complex vitamins capsule Take 1 capsule by mouth daily.    Yes Historical Provider, MD  bimatoprost (LUMIGAN) 0.03 % ophthalmic solution Place 1 drop into the left eye at bedtime.    Yes Historical Provider, MD  Biotin 5000 MCG CAPS Take 1 capsule by mouth daily.   Yes Historical Provider, MD  cetirizine (ZYRTEC) 10 MG tablet Take 1 tablet (10 mg total) by mouth daily. 10/25/12  Yes Sheliah Hatch, MD  clarithromycin Quita Skye  XL) 500 MG 24 hr tablet Take 2 tablets (1,000 mg total) by mouth daily. 05/12/13  Yes Sheliah Hatch, MD  fenofibrate (TRICOR) 145 MG tablet Take 1 tablet (145 mg total) by mouth daily after breakfast. 10/25/12  Yes Sheliah Hatch, MD  folic acid (FOLVITE) 400 MCG tablet  Take 400 mcg by mouth daily.   Yes Historical Provider, MD  Liraglutide (VICTOZA) 18 MG/3ML SOPN Inject 1.2 mg into the skin daily. 03/05/13  Yes Sheliah Hatch, MD  magnesium oxide (MAG-OX) 400 MG tablet Take 400 mg by mouth daily.   Yes Historical Provider, MD  Melatonin 10 MG CAPS Take 1 capsule by mouth at bedtime. 10/25/12  Yes Sheliah Hatch, MD  OVER THE COUNTER MEDICATION Take 1 tablet by mouth 2 (two) times daily. Glucosamine 1500mg  chondroitin 1200mg    Yes Historical Provider, MD  predniSONE (DELTASONE) 10 MG tablet Take 10 mg by mouth See admin instructions. 3 tabs x3 days and then 2 tabs x3 days and then 1 tab x3 days.  Take w/ food. Patient states she is on the 4th day of taking 2 tabs as of 05-16-13 05/12/13  Yes Sheliah Hatch, MD  promethazine-dextromethorphan (PROMETHAZINE-DM) 6.25-15 MG/5ML syrup Take 5 mLs by mouth 4 (four) times daily as needed for cough. 05/09/13  Yes Sheliah Hatch, MD  venlafaxine XR (EFFEXOR XR) 75 MG 24 hr capsule TAKE ONE (1) CAPSULE EACH DAY 07/22/12  Yes Sheliah Hatch, MD  VITAMIN D, CHOLECALCIFEROL, PO Take 1,000 Units by mouth daily.    Yes Historical Provider, MD  Insulin Pen Needle 32G X 6 MM MISC 1 each by Does not apply route daily. To inject insulin. 03/26/13   Sheliah Hatch, MD    Allergies:   Allergies  Allergen Reactions  . Papain Anaphylaxis    Takes an extreme concentration  . Papaya Derivatives Anaphylaxis  . Mushroom Ext Cmplx(Shiitake-Reishi-Mait) Other (See Comments)    Head congestion and sore throat  . Penicillins   . Venlafaxine Other (See Comments)    Pt states she can take Name brand. Lethargy    Social History:  reports that she has quit smoking. Her smoking use included Cigarettes. She smoked 0.00 packs per day. She has never used smokeless tobacco. She reports that she drinks alcohol. She reports that she does not use illicit drugs.  Family History: Family History  Problem Relation Age of Onset  .  Diabetes Father   . Colon cancer Neg Hx   . Esophageal cancer Neg Hx   . Rectal cancer Neg Hx   . Stomach cancer Neg Hx   . Breast cancer Neg Hx     Physical Exam: Blood pressure 133/64, pulse 110, temperature 98 F (36.7 C), temperature source Oral, resp. rate 23, SpO2 100.00%. General: Alert, awake, oriented x3, in mild respiratory distress however able to speak full sentences. HEENT: normocephalic, atraumatic, anicteric sclera, pink conjunctiva, pupils equal and reactive to light and accomodation, oropharynx clear Neck: supple, no masses or lymphadenopathy, no goiter, no bruits  Heart: Regular rate and rhythm, without murmurs, rubs or gallops. Sinus tachycardia Lungs: Diffuse wheezing bilaterally  Abdomen: Soft, nontender, nondistended, positive bowel sounds, no masses. Extremities: No clubbing, cyanosis or edema with positive pedal pulses. Neuro: Grossly intact, no focal neurological deficits, strength 5/5 upper and lower extremities bilaterally Psych: alert and oriented x 3, normal mood and affect Skin: no rashes or lesions, warm and dry   LABS on Admission:  Basic Metabolic Panel:  Recent Labs Lab 05/16/13 1324  NA 134*  K 3.8  CL 95*  CO2 17*  GLUCOSE 301*  BUN 16  CREATININE 0.72  CALCIUM 9.5   Liver Function Tests: No results found for this basename: AST, ALT, ALKPHOS, BILITOT, PROT, ALBUMIN,  in the last 168 hours No results found for this basename: LIPASE, AMYLASE,  in the last 168 hours No results found for this basename: AMMONIA,  in the last 168 hours CBC:  Recent Labs Lab 05/16/13 1324  WBC 9.1  HGB 14.1  HCT 39.6  MCV 83.7  PLT 249   Cardiac Enzymes: No results found for this basename: CKTOTAL, CKMB, CKMBINDEX, TROPONINI,  in the last 168 hours BNP: No components found with this basename: POCBNP,  CBG: No results found for this basename: GLUCAP,  in the last 168 hours   Radiological Exams on Admission: Dg Chest Portable 1  View  05/16/2013   CLINICAL DATA:  Shortness of breath.  EXAM: PORTABLE CHEST - 1 VIEW  COMPARISON:  04/20/2012.  FINDINGS: Mediastinum and hilar structures are normal. Heart size normal. Mild subsegmental atelectasis left lung base. No pleural effusion or pneumothorax. Degenerative changes thoracic spine and both shoulders.  IMPRESSION: Mild subsegmental atelectasis left lung base.   Electronically Signed   By: Maisie Fus  Register   On: 05/16/2013 13:51    Assessment/Plan Principal Problem:   Asthma exacerbation - Placed on scheduled Nebulizer treatments, Pulmicort, IV Solu-Medrol (transition to oral prednisone tomorrow if improving), IV Levaquin, flutter valve - Patient does not have any nebulizer machine on nebulizers at home, will need case management help - Home O2 evaluation at the time of discharge  Active Problems:   DIABETES MELLITUS, TYPE II - Obtain hemoglobin A1c, place on sliding scale insulin    HYPERTRIGLYCERIDEMIA - Continue TriCor  DVT prophylaxis: Lovenox   CODE STATUS:  discussed in detail with the patient, she opted to be full CODE STATUS, intubation if needed   Family Communication: Admission, patients condition and plan of care including tests being ordered have been discussed with the patient  who indicates understanding and agree with the plan and Code Status   Further plan will depend as patient's clinical course evolves and further radiologic and laboratory data become available.   Time Spent on Admission: 1 hour  Harl Wiechmann M.D. [facility] 05/16/2013, 5:29 PM Pager: [phone]  If 7PM-7AM, please contact night-coverage [email or web address] Password [email or web address]

## 2013-05-16 NOTE — Progress Notes (Signed)
PT with an elevated cbg of 406. rn paged Md on call. Awaiting further orders.

## 2013-05-16 NOTE — Assessment & Plan Note (Signed)
A1c 6.4 % pre oral steroids; ? Current level of control

## 2013-05-16 NOTE — Assessment & Plan Note (Addendum)
Clinical respiratory compromise with desaturation . Asymmetry of lung sounds raise possibility of R sided pneumonia, especially with PMH of recurrent PNA & respiratory arrest 2009. Rx: neb treatment , O2 @2L / min Post neb treatment & nasal O2 ; O2 sats still 88 %. Ambulance transport to hospital for admission with parenteral therapy

## 2013-05-16 NOTE — Progress Notes (Signed)
Pt admitted to the unit. Pt is alert and oriented. Pt oriented to room, staff, and call bell. Bed in lowest position. Full assessment to Epic. Full Code.  Call bell with in reach. Told to call for assists. Will continue to monitor.  Earley Favor, Zeplin Aleshire L

## 2013-05-17 DIAGNOSIS — J42 Unspecified chronic bronchitis: Secondary | ICD-10-CM

## 2013-05-17 DIAGNOSIS — E119 Type 2 diabetes mellitus without complications: Secondary | ICD-10-CM

## 2013-05-17 DIAGNOSIS — J209 Acute bronchitis, unspecified: Principal | ICD-10-CM | POA: Diagnosis present

## 2013-05-17 LAB — GLUCOSE, CAPILLARY
Glucose-Capillary: 285 mg/dL — ABNORMAL HIGH (ref 70–99)
Glucose-Capillary: 391 mg/dL — ABNORMAL HIGH (ref 70–99)
Glucose-Capillary: 463 mg/dL — ABNORMAL HIGH (ref 70–99)
Glucose-Capillary: 412 mg/dL — ABNORMAL HIGH (ref 70–99)

## 2013-05-17 LAB — BASIC METABOLIC PANEL
BUN: 16 mg/dL (ref 6–23)
CO2: 24 mEq/L (ref 19–32)
Calcium: 9.4 mg/dL (ref 8.4–10.5)
Creatinine, Ser: 0.62 mg/dL (ref 0.50–1.10)
GFR calc non Af Amer: 87 mL/min — ABNORMAL LOW (ref >90)
Glucose, Bld: 300 mg/dL — ABNORMAL HIGH (ref 70–99)

## 2013-05-17 LAB — CBC
MCH: 29.5 pg (ref 26.0–34.0)
MCHC: 35.1 g/dL (ref 30.0–36.0)
MCV: 84 fL (ref 78.0–100.0)
Platelets: 310 10*3/uL (ref 150–400)
WBC: 8.1 10*3/uL (ref 4.0–10.5)

## 2013-05-17 LAB — HEMOGLOBIN A1C: Mean Plasma Glucose: 146 mg/dL — ABNORMAL HIGH (ref <117)

## 2013-05-17 MED ORDER — IPRATROPIUM BROMIDE 0.02 % IN SOLN
RESPIRATORY_TRACT | Status: AC
  Filled 2013-05-17: qty 2.5

## 2013-05-17 MED ORDER — POTASSIUM CHLORIDE CRYS ER 20 MEQ PO TBCR
40.0000 meq | EXTENDED_RELEASE_TABLET | Freq: Once | ORAL | Status: AC
  Administered 2013-05-17: 40 meq via ORAL
  Filled 2013-05-17: qty 2

## 2013-05-17 MED ORDER — FUROSEMIDE 10 MG/ML IJ SOLN
40.0000 mg | Freq: Once | INTRAMUSCULAR | Status: AC
  Administered 2013-05-17: 11:00:00 40 mg via INTRAVENOUS
  Filled 2013-05-17: qty 4

## 2013-05-17 MED ORDER — INSULIN ASPART 100 UNIT/ML ~~LOC~~ SOLN
8.0000 [IU] | Freq: Once | SUBCUTANEOUS | Status: AC
  Administered 2013-05-17: 22:00:00 8 [IU] via SUBCUTANEOUS

## 2013-05-17 MED ORDER — INSULIN ASPART 100 UNIT/ML ~~LOC~~ SOLN
15.0000 [IU] | Freq: Once | SUBCUTANEOUS | Status: AC
  Administered 2013-05-17: 15 [IU] via SUBCUTANEOUS

## 2013-05-17 MED ORDER — GUAIFENESIN ER 600 MG PO TB12
1200.0000 mg | ORAL_TABLET | Freq: Two times a day (BID) | ORAL | Status: DC
  Administered 2013-05-17 – 2013-05-19 (×5): 1200 mg via ORAL
  Filled 2013-05-17 (×7): qty 2

## 2013-05-17 MED ORDER — LEVALBUTEROL HCL 0.63 MG/3ML IN NEBU
INHALATION_SOLUTION | RESPIRATORY_TRACT | Status: AC
  Administered 2013-05-17: 0.63 mg
  Filled 2013-05-17: qty 3

## 2013-05-17 NOTE — Progress Notes (Signed)
Pt Blood sugar was 463, MD contacted and 15 units were ordered. Will continue to monitor pt.

## 2013-05-17 NOTE — Progress Notes (Signed)
UR completed. Haik Mahoney RN CCM Case Mgmt 

## 2013-05-17 NOTE — Progress Notes (Signed)
[facility] PROGRESS NOTE  CLOA BUSHONG WUJ:[id number] DOB: 06/15/1941 DOA: 05/16/2013 PCP: Neena Rhymes, MD  HPI/Subjective: Feels much better, still has nonproductive cough  Assessment/Plan: Principal Problem:   Asthma exacerbation Active Problems:   DIABETES MELLITUS, TYPE II   HYPERTRIGLYCERIDEMIA   Asthma   Acute asthma exacerbation with acute bronchitis -Patient treated as outpatient for acute bronchitis, failed outpatient therapy. -Admitted to the hospital, started on IV Levaquin, continue inhaled bronchodilators. -Started also on systemic steroid therapy, supportive management with antitussives and mucolytics. -Chest x-ray showed no evidence of pneumonia.  Diabetes mellitus type 2 -Controlled diabetes with hemoglobin A1c of 6.7. -Currently on insulin sliding scale, patient is on high dose of steroids.  Hypertriglyceridemia -Patient is on TriCor, continue.  Code Status: Full code Family Communication: Plan discussed with the patient. Disposition Plan: Remains inpatient   Consultants:  None  Procedures:  None  Antibiotics:  On Levaquin 750 mg.   Objective: Filed Vitals:   05/17/13 0705  BP: 157/77  Pulse: 94  Temp: 97.8 F (36.6 C)  Resp: 28   No intake or output data in the 24 hours ending 05/17/13 1026 Filed Weights   05/16/13 1750  Weight: 77.111 kg (170 lb)    Exam: General: Alert and awake, oriented x3, not in any acute distress. HEENT: anicteric sclera, pupils reactive to light and accommodation, EOMI CVS: S1-S2 clear, no murmur rubs or gallops Chest: clear to auscultation bilaterally, no wheezing, rales or rhonchi Abdomen: soft nontender, nondistended, normal bowel sounds, no organomegaly Extremities: no cyanosis, clubbing or edema noted bilaterally Neuro: Cranial nerves II-XII intact, no focal neurological deficits  Data Reviewed: Basic Metabolic Panel:  Recent Labs Lab 05/16/13 1324 05/16/13 2034 05/17/13 0600   NA 134*  --  134*  K 3.8  --  3.7  CL 95*  --  96  CO2 17*  --  24  GLUCOSE 301*  --  300*  BUN 16  --  16  CREATININE 0.72 0.76 0.62  CALCIUM 9.5  --  9.4   Liver Function Tests: No results found for this basename: AST, ALT, ALKPHOS, BILITOT, PROT, ALBUMIN,  in the last 168 hours No results found for this basename: LIPASE, AMYLASE,  in the last 168 hours No results found for this basename: AMMONIA,  in the last 168 hours CBC:  Recent Labs Lab 05/16/13 1324 05/16/13 2034 05/17/13 0600  WBC 9.1 9.0 8.1  HGB 14.1 13.1 12.7  HCT 39.6 37.5 36.2  MCV 83.7 85.2 84.0  PLT 249 299 310   Cardiac Enzymes: No results found for this basename: CKTOTAL, CKMB, CKMBINDEX, TROPONINI,  in the last 168 hours BNP (last 3 results)  Recent Labs  05/16/13 2034  PROBNP 152.3*   CBG:  Recent Labs Lab 05/16/13 2155 05/17/13 0745  GLUCAP 406* 285*    Micro No results found for this or any previous visit (from the past 240 hour(s)).   Studies: Dg Chest Portable 1 View  05/16/2013   CLINICAL DATA:  Shortness of breath.  EXAM: PORTABLE CHEST - 1 VIEW  COMPARISON:  04/20/2012.  FINDINGS: Mediastinum and hilar structures are normal. Heart size normal. Mild subsegmental atelectasis left lung base. No pleural effusion or pneumothorax. Degenerative changes thoracic spine and both shoulders.  IMPRESSION: Mild subsegmental atelectasis left lung base.   Electronically Signed   By: Maisie Fus  Register   On: 05/16/2013 13:51    Scheduled Meds: . budesonide  0.25 mg Nebulization BID  . enoxaparin (LOVENOX) injection  40 mg Subcutaneous Q24H  . fenofibrate  160 mg Oral Daily  . folic acid  0.5 mg Oral Daily  . insulin aspart  0-5 Units Subcutaneous QHS  . insulin aspart  0-9 Units Subcutaneous TID WC  . ipratropium  0.5 mg Nebulization QID  . latanoprost  1 drop Left Eye QHS  . levalbuterol  0.63 mg Nebulization QID  . levofloxacin (LEVAQUIN) IV  750 mg Intravenous Q24H  . loratadine  10 mg Oral  Daily  . magnesium oxide  400 mg Oral Daily  . methylPREDNISolone (SOLU-MEDROL) injection  60 mg Intravenous Q6H  . pantoprazole  40 mg Oral BID  . sodium chloride  3 mL Intravenous Q12H   Continuous Infusions:      Time spent: 35 minutes    [facility] A  [facility] Pager [phone] If 7PM-7AM, please contact night-coverage at [email or web address], password [facility] 05/17/2013, 10:26 AM  LOS: 1 day

## 2013-05-18 DIAGNOSIS — J209 Acute bronchitis, unspecified: Principal | ICD-10-CM

## 2013-05-18 LAB — BASIC METABOLIC PANEL
BUN: 24 mg/dL — ABNORMAL HIGH (ref 6–23)
Calcium: 9.5 mg/dL (ref 8.4–10.5)
Creatinine, Ser: 0.8 mg/dL (ref 0.50–1.10)
GFR calc non Af Amer: 71 mL/min — ABNORMAL LOW (ref >90)
Glucose, Bld: 328 mg/dL — ABNORMAL HIGH (ref 70–99)
Sodium: 133 mEq/L — ABNORMAL LOW (ref 135–145)

## 2013-05-18 LAB — URINE CULTURE: Colony Count: 15000

## 2013-05-18 LAB — CBC
HCT: 36.3 % (ref 36.0–46.0)
MCH: 29.1 pg (ref 26.0–34.0)
MCHC: 34.7 g/dL (ref 30.0–36.0)
Platelets: 322 10*3/uL (ref 150–400)
RDW: 13.5 % (ref 11.5–15.5)
WBC: 9.6 10*3/uL (ref 4.0–10.5)

## 2013-05-18 LAB — GLUCOSE, CAPILLARY
Glucose-Capillary: 366 mg/dL — ABNORMAL HIGH (ref 70–99)
Glucose-Capillary: 366 mg/dL — ABNORMAL HIGH (ref 70–99)
Glucose-Capillary: 289 mg/dL — ABNORMAL HIGH (ref 70–99)

## 2013-05-18 MED ORDER — INSULIN ASPART 100 UNIT/ML ~~LOC~~ SOLN
0.0000 [IU] | Freq: Three times a day (TID) | SUBCUTANEOUS | Status: DC
  Administered 2013-05-18 – 2013-05-19 (×3): 20 [IU] via SUBCUTANEOUS

## 2013-05-18 NOTE — Progress Notes (Signed)
Physician notified of cbg results. New order received.

## 2013-05-18 NOTE — Progress Notes (Signed)
[facility] PROGRESS NOTE  [name] ZOX:[id number] DOB: [birth year] DOA: 05/16/2013 PCP: Neena Rhymes, MD  HPI/Subjective: Feels much better, still has wheezing and nonproductive cough  Assessment/Plan: Principal Problem:   Asthma exacerbation Active Problems:   DIABETES MELLITUS, TYPE II   HYPERTRIGLYCERIDEMIA   Asthma   Acute bronchitis   Acute asthma exacerbation with acute bronchitis -Patient treated as outpatient for acute bronchitis, failed outpatient therapy. -Admitted to the hospital, started on IV Levaquin, continue inhaled bronchodilators. -Started also on systemic steroid therapy, supportive management with antitussives and mucolytics. -Chest x-ray showed no evidence of pneumonia.  Diabetes mellitus type 2 -Controlled diabetes with hemoglobin A1c of 6.7. -Currently on insulin sliding scale, patient is on high dose of steroids. -Currently has uncontrolled CBGs secondary to high dose of steroids, increase sliding scale dosage.  Hypertriglyceridemia -Patient is on TriCor, continue.  Code Status: Full code Family Communication: Plan discussed with the patient. Disposition Plan: Remains inpatient   Consultants:  None  Procedures:  None  Antibiotics:  On Levaquin 750 mg.   Objective: Filed Vitals:   05/18/13 0608  BP: 151/77  Pulse: 85  Temp: 98.4 F (36.9 C)  Resp: 20    Intake/Output Summary (Last 24 hours) at 05/18/13 0958 Last data filed at 05/18/13 0000  Gross per 24 hour  Intake    450 ml  Output      0 ml  Net    450 ml   Filed Weights   05/16/13 1750  Weight: 77.111 kg (170 lb)    Exam: General: Alert and awake, oriented x3, not in any acute distress. HEENT: anicteric sclera, pupils reactive to light and accommodation, EOMI CVS: S1-S2 clear, no murmur rubs or gallops Chest: clear to auscultation bilaterally, no wheezing, rales or rhonchi Abdomen: soft nontender, nondistended, normal bowel sounds, no  organomegaly Extremities: no cyanosis, clubbing or edema noted bilaterally Neuro: Cranial nerves II-XII intact, no focal neurological deficits  Data Reviewed: Basic Metabolic Panel:  Recent Labs Lab 05/16/13 1324 05/16/13 2034 05/17/13 0600 05/18/13 0445  NA 134*  --  134* 133*  K 3.8  --  3.7 4.2  CL 95*  --  96 95*  CO2 17*  --  24 24  GLUCOSE 301*  --  300* 328*  BUN 16  --  16 24*  CREATININE 0.72 0.76 0.62 0.80  CALCIUM 9.5  --  9.4 9.5   Liver Function Tests: No results found for this basename: AST, ALT, ALKPHOS, BILITOT, PROT, ALBUMIN,  in the last 168 hours No results found for this basename: LIPASE, AMYLASE,  in the last 168 hours No results found for this basename: AMMONIA,  in the last 168 hours CBC:  Recent Labs Lab 05/16/13 1324 05/16/13 2034 05/17/13 0600 05/18/13 0445  WBC 9.1 9.0 8.1 9.6  HGB 14.1 13.1 12.7 12.6  HCT 39.6 37.5 36.2 36.3  MCV 83.7 85.2 84.0 83.8  PLT 249 299 310 322   Cardiac Enzymes: No results found for this basename: CKTOTAL, CKMB, CKMBINDEX, TROPONINI,  in the last 168 hours BNP (last 3 results)  Recent Labs  05/16/13 2034  PROBNP 152.3*   CBG:  Recent Labs Lab 05/17/13 0745 05/17/13 1140 05/17/13 1727 05/17/13 2116 05/18/13 0758  GLUCAP 285* 391* 463* 412* 366*    Micro No results found for this or any previous visit (from the past 240 hour(s)).   Studies: Dg Chest Portable 1 View  05/16/2013   CLINICAL DATA:  Shortness of breath.  EXAM:  PORTABLE CHEST - 1 VIEW  COMPARISON:  04/20/2012.  FINDINGS: Mediastinum and hilar structures are normal. Heart size normal. Mild subsegmental atelectasis left lung base. No pleural effusion or pneumothorax. Degenerative changes thoracic spine and both shoulders.  IMPRESSION: Mild subsegmental atelectasis left lung base.   Electronically Signed   By: Maisie Fus  Register   On: 05/16/2013 13:51    Scheduled Meds: . budesonide  0.25 mg Nebulization BID  . enoxaparin (LOVENOX)  injection  40 mg Subcutaneous Q24H  . fenofibrate  160 mg Oral Daily  . folic acid  0.5 mg Oral Daily  . guaiFENesin  1,200 mg Oral BID  . insulin aspart  0-5 Units Subcutaneous QHS  . insulin aspart  0-9 Units Subcutaneous TID WC  . ipratropium  0.5 mg Nebulization QID  . latanoprost  1 drop Left Eye QHS  . levalbuterol  0.63 mg Nebulization QID  . levofloxacin (LEVAQUIN) IV  750 mg Intravenous Q24H  . loratadine  10 mg Oral Daily  . magnesium oxide  400 mg Oral Daily  . methylPREDNISolone (SOLU-MEDROL) injection  60 mg Intravenous Q6H  . pantoprazole  40 mg Oral BID  . sodium chloride  3 mL Intravenous Q12H   Continuous Infusions:      Time spent: 35 minutes    [facility] A  [facility] Pager [phone] If 7PM-7AM, please contact night-coverage at [email or web address], password [facility] 05/18/2013, 9:58 AM  LOS: 2 days

## 2013-05-18 NOTE — Progress Notes (Signed)
Patient slept through night. No c/o voiced.

## 2013-05-19 LAB — BASIC METABOLIC PANEL
BUN: 25 mg/dL — ABNORMAL HIGH (ref 6–23)
CO2: 23 mEq/L (ref 19–32)
Calcium: 9.3 mg/dL (ref 8.4–10.5)
Chloride: 94 mEq/L — ABNORMAL LOW (ref 96–112)
Creatinine, Ser: 0.81 mg/dL (ref 0.50–1.10)
Glucose, Bld: 359 mg/dL — ABNORMAL HIGH (ref 70–99)

## 2013-05-19 MED ORDER — LEVOFLOXACIN 500 MG PO TABS: 500.0000 mg | ORAL_TABLET | Freq: Every day | ORAL | Status: DC

## 2013-05-19 MED ORDER — PREDNISONE 10 MG PO TABS: ORAL_TABLET | ORAL | Status: DC

## 2013-05-19 MED ORDER — ALBUTEROL SULFATE HFA 108 (90 BASE) MCG/ACT IN AERS: 2.0000 | INHALATION_SPRAY | RESPIRATORY_TRACT | Status: DC | PRN

## 2013-05-19 NOTE — Discharge Summary (Signed)
Physician Discharge Summary  [name]:[id number] DOB: 29-Mar-1941 DOA: 05/16/2013  PCP: Neena Rhymes, MD  Admit date: 05/16/2013 Discharge date: 05/19/2013  Time spent: 40 minutes  Recommendations for Outpatient Follow-up:  1. Followup with primary care physician within one week  Discharge Diagnoses:  Principal Problem:   Asthma exacerbation Active Problems:   DIABETES MELLITUS, TYPE II   HYPERTRIGLYCERIDEMIA   Asthma   Acute bronchitis   Discharge Condition: Stable  Diet recommendation: Modified diet  Filed Weights   05/16/13 1750  Weight: 77.111 kg (170 lb)    History of present illness:  Patient is 72 year old female with history of pneumonia, asthma, history of prior respiratory arrest in [place] (relocated to [place] 3 years ago), presented to the ER with above complaints. Patient reports that she has been having shortness of breath with wheezing, low-grade fevers, productive cough for last one week. Patient went to her primary care physician who placed her on antibiotics. Patient reports that she continued to get worse and Friday (a week ago), she went back to her PCP.  Patient was noted to have hypoxia with O2 sats 88% and was treated with nebulizer and placed on prednisone. Patient states that she started feeling a little bit better however this morning she woke up and couldn't breathe.   Hospital Course:   1. Acute bronchitis: Patient failed outpatient therapy for acute bronchitis, but came in to the hospital with shortness of breath and low-grade fever. Started on IV Levaquin. Inhaled bronchodilators, supportive management with antitussives and mucolytics continued throughout the hospital stay. Chest x-ray showed no evidence of pneumonia. On the day of discharge Levaquin for 5 more days prescribed as well as prednisone taper.  2. Acute asthma exacerbation: Patient had mild asthma which pretty controlled, asthma exacerbation happen because of the acute  bronchitis. This is treated with systemic steroids, inhaled bronchodilators. Wheezing resolved prior to discharge.  3. Diabetes mellitus type 2: Uncontrolled diabetes with hemoglobin A1c of 6.7. On the day of discharge patient started on insulin sliding scale. The CBGs were high in the hospital secondary to the high-dose of steroids patient was getting for her asthma exacerbation. Explained to the patient to expect high blood glucose still she finishes the prednisone taper.  4. Hypertriglyceridemia: She is on TriCor, continued.  Procedures:  None  Consultations:  None  Discharge Exam: Filed Vitals:   05/19/13 0617  BP: 148/81  Pulse: 92  Temp: 98.2 F (36.8 C)  Resp: 20   General: Alert and awake, oriented x3, not in any acute distress. HEENT: anicteric sclera, pupils reactive to light and accommodation, EOMI CVS: S1-S2 clear, no murmur rubs or gallops Chest: clear to auscultation bilaterally, no wheezing, rales or rhonchi Abdomen: soft nontender, nondistended, normal bowel sounds, no organomegaly Extremities: no cyanosis, clubbing or edema noted bilaterally Neuro: Cranial nerves II-XII intact, no focal neurological deficits  Discharge Instructions     Medication List    STOP taking these medications       clarithromycin 500 MG 24 hr tablet  Commonly known as:  BIAXIN XL      TAKE these medications       albuterol 108 (90 BASE) MCG/ACT inhaler  Commonly known as:  PROAIR HFA  Inhale 2 puffs into the lungs every 4 (four) hours as needed for wheezing or shortness of breath.     b complex vitamins capsule  Take 1 capsule by mouth daily.     bimatoprost 0.03 % ophthalmic solution  Commonly known as:  LUMIGAN  Place 1 drop into the left eye at bedtime.     Biotin 5000 MCG Caps  Take 1 capsule by mouth daily.     cetirizine 10 MG tablet  Commonly known as:  ZYRTEC  Take 1 tablet (10 mg total) by mouth daily.     EFFEXOR XR 75 MG 24 hr capsule  Generic drug:   venlafaxine XR  TAKE ONE (1) CAPSULE EACH DAY     fenofibrate 145 MG tablet  Commonly known as:  TRICOR  Take 1 tablet (145 mg total) by mouth daily after breakfast.     folic acid 400 MCG tablet  Commonly known as:  FOLVITE  Take 400 mcg by mouth daily.     Insulin Pen Needle 32G X 6 MM Misc  1 each by Does not apply route daily. To inject insulin.     levofloxacin 500 MG tablet  Commonly known as:  LEVAQUIN  Take 1 tablet (500 mg total) by mouth daily.     Liraglutide 18 MG/3ML Sopn  Commonly known as:  VICTOZA  Inject 1.2 mg into the skin daily.     magnesium oxide 400 MG tablet  Commonly known as:  MAG-OX  Take 400 mg by mouth daily.     Melatonin 10 MG Caps  Take 1 capsule by mouth at bedtime.     OVER THE COUNTER MEDICATION  Take 1 tablet by mouth 2 (two) times daily. Glucosamine 1500mg  chondroitin 1200mg      predniSONE 10 MG tablet  Commonly known as:  DELTASONE  Take 6-5-4-3-2-1 PO daily till gone     promethazine-dextromethorphan 6.25-15 MG/5ML syrup  Commonly known as:  PROMETHAZINE-DM  Take 5 mLs by mouth 4 (four) times daily as needed for cough.     VITAMIN D (CHOLECALCIFEROL) PO  Take 1,000 Units by mouth daily.       Allergies  Allergen Reactions  . Papain Anaphylaxis    Takes an extreme concentration  . Papaya Derivatives Anaphylaxis  . Mushroom Ext Cmplx(Shiitake-Reishi-Mait) Other (See Comments)    Head congestion and sore throat  . Penicillins   . Venlafaxine Other (See Comments)    Pt states she can take Name brand. Lethargy       Follow-up Information   Follow up with Neena Rhymes, MD In 1 week.   Specialty:  Family Medicine   Contact information:   [phone] [place] [address] [phone]        The results of significant diagnostics from this hospitalization (including imaging, microbiology, ancillary and laboratory) are listed below for reference.    Significant Diagnostic Studies: Dg Chest Portable 1  View  05/16/2013   CLINICAL DATA:  Shortness of breath.  EXAM: PORTABLE CHEST - 1 VIEW  COMPARISON:  04/20/2012.  FINDINGS: Mediastinum and hilar structures are normal. Heart size normal. Mild subsegmental atelectasis left lung base. No pleural effusion or pneumothorax. Degenerative changes thoracic spine and both shoulders.  IMPRESSION: Mild subsegmental atelectasis left lung base.   Electronically Signed   By: Maisie Fus  Register   On: 05/16/2013 13:51    Microbiology: Recent Results (from the past 240 hour(s))  URINE CULTURE     Status: None   Collection Time    05/16/13  8:46 PM      Result Value Range Status   Specimen Description URINE, RANDOM   Final   Special Requests NONE   Final   Culture  Setup Time     Final  Value: 05/16/2013 22:57     Performed at [facility] Count     Final   Value: 15,000 COLONIES/ML     Performed at [facility]   Culture     Final   Value: Multiple bacterial morphotypes present, none predominant. Suggest appropriate recollection if clinically indicated.     Performed at [facility]   Report Status 05/18/2013 FINAL   Final     Labs: Basic Metabolic Panel:  Recent Labs Lab 05/16/13 1324 05/16/13 2034 05/17/13 0600 05/18/13 0445 05/19/13 0652  NA 134*  --  134* 133* 131*  K 3.8  --  3.7 4.2 4.2  CL 95*  --  96 95* 94*  CO2 17*  --  24 24 23   GLUCOSE 301*  --  300* 328* 359*  BUN 16  --  16 24* 25*  CREATININE 0.72 0.76 0.62 0.80 0.81  CALCIUM 9.5  --  9.4 9.5 9.3   Liver Function Tests: No results found for this basename: AST, ALT, ALKPHOS, BILITOT, PROT, ALBUMIN,  in the last 168 hours No results found for this basename: LIPASE, AMYLASE,  in the last 168 hours No results found for this basename: AMMONIA,  in the last 168 hours CBC:  Recent Labs Lab 05/16/13 1324 05/16/13 2034 05/17/13 0600 05/18/13 0445  WBC 9.1 9.0 8.1 9.6  HGB 14.1 13.1 12.7 12.6  HCT 39.6 37.5 36.2 36.3  MCV 83.7 85.2 84.0  83.8  PLT 249 299 310 322   Cardiac Enzymes: No results found for this basename: CKTOTAL, CKMB, CKMBINDEX, TROPONINI,  in the last 168 hours BNP: BNP (last 3 results)  Recent Labs  05/16/13 2034  PROBNP 152.3*   CBG:  Recent Labs Lab 05/18/13 0758 05/18/13 1205 05/18/13 1656 05/18/13 2033 05/19/13 0735  GLUCAP 366* 366* 356* 289* 400*       Signed:  Carrera Kiesel A  [facility] 05/19/2013, 10:23 AM

## 2013-05-19 NOTE — Care Management Note (Signed)
    Page 1 of 1   05/19/2013     12:13:15 PM   CARE MANAGEMENT NOTE 05/19/2013  Patient:  [name], [name]   Account Number:  [id number]  Date Initiated:  05/17/2013  Documentation initiated by:  [facility]  Subjective/Objective Assessment:   asthma exacerbation     Action/Plan:   Anticipated DC Date:  05/19/2013   Anticipated DC Plan:  HOME W HOME HEALTH SERVICES      DC Planning Services  CM consult      Choice offered to / List presented to:             Status of service:  Completed, signed off Medicare Important Message given?   (If response is "NO", the following Medicare IM given date fields will be blank) Date Medicare IM given:   Date Additional Medicare IM given:    Discharge Disposition:  HOME/SELF CARE  Per UR Regulation:  Reviewed for med. necessity/level of care/duration of stay  If discussed at Long Length of Stay Meetings, dates discussed:    Comments:  05/19/13 12:10 Letha Cape RN, BSN [phone] patient lives with spouse, patient is dc to home, no NCM referral.  No needs antiicpated.

## 2013-05-19 NOTE — Progress Notes (Signed)
[name] to be D/C'd Home per MD order.  Discussed with the patient and all questions fully answered.    Medication List    STOP taking these medications       clarithromycin 500 MG 24 hr tablet  Commonly known as:  BIAXIN XL      TAKE these medications       albuterol 108 (90 BASE) MCG/ACT inhaler  Commonly known as:  PROAIR HFA  Inhale 2 puffs into the lungs every 4 (four) hours as needed for wheezing or shortness of breath.     b complex vitamins capsule  Take 1 capsule by mouth daily.     bimatoprost 0.03 % ophthalmic solution  Commonly known as:  LUMIGAN  Place 1 drop into the left eye at bedtime.     Biotin 5000 MCG Caps  Take 1 capsule by mouth daily.     cetirizine 10 MG tablet  Commonly known as:  ZYRTEC  Take 1 tablet (10 mg total) by mouth daily.     EFFEXOR XR 75 MG 24 hr capsule  Generic drug:  venlafaxine XR  TAKE ONE (1) CAPSULE EACH DAY     fenofibrate 145 MG tablet  Commonly known as:  TRICOR  Take 1 tablet (145 mg total) by mouth daily after breakfast.     folic acid 400 MCG tablet  Commonly known as:  FOLVITE  Take 400 mcg by mouth daily.     Insulin Pen Needle 32G X 6 MM Misc  1 each by Does not apply route daily. To inject insulin.     levofloxacin 500 MG tablet  Commonly known as:  LEVAQUIN  Take 1 tablet (500 mg total) by mouth daily.     Liraglutide 18 MG/3ML Sopn  Commonly known as:  VICTOZA  Inject 1.2 mg into the skin daily.     magnesium oxide 400 MG tablet  Commonly known as:  MAG-OX  Take 400 mg by mouth daily.     Melatonin 10 MG Caps  Take 1 capsule by mouth at bedtime.     OVER THE COUNTER MEDICATION  Take 1 tablet by mouth 2 (two) times daily. Glucosamine 1500mg  chondroitin 1200mg      predniSONE 10 MG tablet  Commonly known as:  DELTASONE  Take 6-5-4-3-2-1 PO daily till gone     promethazine-dextromethorphan 6.25-15 MG/5ML syrup  Commonly known as:  PROMETHAZINE-DM  Take 5 mLs by mouth 4 (four) times daily  as needed for cough.     VITAMIN D (CHOLECALCIFEROL) PO  Take 1,000 Units by mouth daily.        VVS, Skin clean, dry and intact without evidence of skin break down, no evidence of skin tears noted. IV catheter discontinued intact. Site without signs and symptoms of complications. Dressing and pressure applied.  An After Visit Summary was printed and given to the patient. Follow up appointments , new prescriptions and medication administration times given. Pt given handout and education on Bronchitis with teachback. Patient escorted via WC, and D/C home via private auto.  Cindra Eves, RN 05/19/2013 11:30 AM

## 2013-05-19 NOTE — Progress Notes (Signed)
Patient slept throughout majority of night. Patient states she does not feel the need for oxygen any longer. Patient does practice deep breathing.

## 2013-05-21 ENCOUNTER — Telehealth: Payer: Self-pay | Admitting: Family Medicine

## 2013-05-21 ENCOUNTER — Telehealth: Payer: Self-pay | Admitting: *Deleted

## 2013-05-21 NOTE — Telephone Encounter (Signed)
Pt spoke w/ Lorene Dy.  See other phone note

## 2013-05-21 NOTE — Telephone Encounter (Signed)
Called and spoke with Pt and spouse. Was advised that pt is feeling better with the inhaler use but is still not great. Pt spouse declined any appts at [facility] office stating that pt would prefer to wait on appt with Tabori tomorrow. Pt and spouse were advised that if she is worse that she needs to be seen at the [facility] in [facility]. Pt and spouse agreed to treatment plan.

## 2013-05-21 NOTE — Telephone Encounter (Signed)
Noted.  Agree w/ advice given 

## 2013-05-21 NOTE — Telephone Encounter (Signed)
Patient husband called for his wife and wanted to schedule her for an apt with Dr Beverely Low today but Dr Beverely Low is full. I told patient husband that Dr Beverely Low did not have any appoinments but if she is having a difficult time breathing that she needed to go to the ER. Patient husband then precedes to hang up the phone.

## 2013-05-21 NOTE — Telephone Encounter (Signed)
Patient's husband called triage and left a message stating that the patient was experiencing SOB and believes that she may need a breathing treatment.Calles patient to get more information, their was no answer and was unable to leave a voicemail. Will attempt to call again.

## 2013-05-22 ENCOUNTER — Ambulatory Visit (INDEPENDENT_AMBULATORY_CARE_PROVIDER_SITE_OTHER): Payer: Medicare Other | Admitting: Family Medicine

## 2013-05-22 ENCOUNTER — Encounter: Payer: Self-pay | Admitting: Family Medicine

## 2013-05-22 VITALS — BP 128/88 | HR 115 | Temp 98.1°F | Resp 16 | Wt 171.4 lb

## 2013-05-22 DIAGNOSIS — J45909 Unspecified asthma, uncomplicated: Secondary | ICD-10-CM

## 2013-05-22 DIAGNOSIS — J45901 Unspecified asthma with (acute) exacerbation: Secondary | ICD-10-CM

## 2013-05-22 MED ORDER — ALBUTEROL SULFATE (5 MG/ML) 0.5% IN NEBU
2.5000 mg | INHALATION_SOLUTION | Freq: Once | RESPIRATORY_TRACT | Status: AC
  Administered 2013-05-22: 2.5 mg via RESPIRATORY_TRACT

## 2013-05-22 MED ORDER — METHYLPREDNISOLONE ACETATE 80 MG/ML IJ SUSP
80.0000 mg | Freq: Once | INTRAMUSCULAR | Status: AC
  Administered 2013-05-22: 80 mg via INTRAMUSCULAR

## 2013-05-22 MED ORDER — ALBUTEROL SULFATE (2.5 MG/3ML) 0.083% IN NEBU
2.5000 mg | INHALATION_SOLUTION | Freq: Once | RESPIRATORY_TRACT | Status: AC
  Administered 2013-05-22: 2.5 mg via RESPIRATORY_TRACT

## 2013-05-22 MED ORDER — IPRATROPIUM BROMIDE 0.02 % IN SOLN
0.5000 mg | Freq: Once | RESPIRATORY_TRACT | Status: AC
  Administered 2013-05-22: 0.5 mg via RESPIRATORY_TRACT

## 2013-05-22 MED ORDER — BUDESONIDE-FORMOTEROL FUMARATE 160-4.5 MCG/ACT IN AERO: 2.0000 | INHALATION_SPRAY | Freq: Two times a day (BID) | RESPIRATORY_TRACT | Status: DC

## 2013-05-22 NOTE — Patient Instructions (Signed)
Follow-up next week to recheck breathing Start the Symbicort- 2 puffs twice daily Continue the albuterol Take 3 prednisone tomorrow, 2 pills x4 days, 1 pill x4 days Call with any questions or concerns REST! Hang in there!

## 2013-05-22 NOTE — Progress Notes (Signed)
   Subjective:    Patient ID: [name], female    DOB: [birth year], 72 y.o.   MRN: [id number]  HPI Pre visit review using our clinic review tool, if applicable. No additional management support is needed unless otherwise documented below in the visit note.  Hospital f/u- pt was admitted on 12/28 for asthma exacerbation and bronchitis.  D/c'd on 12/1.  Pt still fatigued and short of breath.  Finished the Levaquin yesterday.  Still on Pred taper.  'i don't think I was ready to come home'.  Cough is starting to be productive.  No fevers.  + wheezing.   Review of Systems For ROS see HPI     Objective:   Physical Exam  Vitals reviewed. Constitutional: She appears well-developed and well-nourished. She appears distressed (pale, dyspnic).  Cardiovascular:  Tachy but regular S1/S2  Pulmonary/Chest:  Increased WOB, diffuse inspiratory and expiratory wheezes- no improvement s/p 1st neb treatment, better relief w/ 2nd neb treatment Wet cough Pt able to ambulate w/out desat'ing          Assessment & Plan:

## 2013-05-25 NOTE — Assessment & Plan Note (Addendum)
Pt not well appearing in office- very SOB.  No improvement w/ 1st neb treatment.  Better relief w/ 2nd neb treatment.  Able to ambulate in office w/out desaturating.  Reviewed pt's d/c instructions- pred taper is too rapid.  Shot of depo-medrol given in office, modified pred taper.  Pt to start Symbicort in addition to albuterol.  Will follow closely.  Pt and husband given strict instructions to return or go to ER if breathing worsens.  Pt expressed understanding and is in agreement w/ plan.

## 2013-05-26 ENCOUNTER — Ambulatory Visit (INDEPENDENT_AMBULATORY_CARE_PROVIDER_SITE_OTHER): Payer: Medicare Other | Admitting: Family Medicine

## 2013-05-26 ENCOUNTER — Encounter: Payer: Self-pay | Admitting: Family Medicine

## 2013-05-26 VITALS — BP 120/70 | HR 114 | Temp 98.0°F | Wt 169.8 lb

## 2013-05-26 DIAGNOSIS — J45909 Unspecified asthma, uncomplicated: Secondary | ICD-10-CM

## 2013-05-26 NOTE — Patient Instructions (Signed)
Follow up as needed Continue the Symbicort daily Use the albuterol as needed for rescue med REST!  Your body needs to recover!!! Call with any questions or concerns Hang in there! Happy Holidays!!!

## 2013-05-26 NOTE — Progress Notes (Signed)
Pre visit review using our clinic review tool, if applicable. No additional management support is needed unless otherwise documented below in the visit note. 

## 2013-05-26 NOTE — Progress Notes (Signed)
   Subjective:    Patient ID: [name], female    DOB: 02/25/1941, 72 y.o.   MRN: [id number]  HPI Pre visit review using our clinic review tool, if applicable. No additional management support is needed unless otherwise documented below in the visit note.  Bronchitis- pt was started on Symbicort last visit.  Hasn't required as much albuterol.  Finishing pred taper.  Pt reports extreme fatigue.  Husband reports cough has decreased, breathing has improved.   Review of Systems For ROS see HPI     Objective:   Physical Exam  Vitals reviewed. Constitutional: She appears well-developed and well-nourished. No distress.  HENT:  Head: Normocephalic and atraumatic.  TMs normal bilaterally Mild nasal congestion Throat w/out erythema, edema, or exudate  Eyes: Conjunctivae and EOM are normal. Pupils are equal, round, and reactive to light.  Neck: Normal range of motion. Neck supple.  Cardiovascular: Normal rate, regular rhythm, normal heart sounds and intact distal pulses.   No murmur heard. Pulmonary/Chest: Effort normal and breath sounds normal. No respiratory distress. She has no wheezes.  + hacking cough  Lymphadenopathy:    She has no cervical adenopathy.          Assessment & Plan:

## 2013-05-27 NOTE — Assessment & Plan Note (Signed)
Improved.  Pt is doing better since start Symbicort- requiring less albuterol.  About to finish steroid taper.  Pt is not short of breath, no increased WOB, not wheezing.  Encouraged her to continue to rest and allow herself time to recover.

## 2013-06-04 ENCOUNTER — Other Ambulatory Visit: Payer: Self-pay | Admitting: Family Medicine

## 2013-06-04 NOTE — Telephone Encounter (Signed)
Med filled.  

## 2013-07-04 ENCOUNTER — Telehealth: Payer: Self-pay | Admitting: Family Medicine

## 2013-07-04 ENCOUNTER — Other Ambulatory Visit: Payer: Self-pay | Admitting: General Practice

## 2013-07-04 DIAGNOSIS — J42 Unspecified chronic bronchitis: Secondary | ICD-10-CM

## 2013-07-04 MED ORDER — BUDESONIDE-FORMOTEROL FUMARATE 160-4.5 MCG/ACT IN AERO: 2.0000 | INHALATION_SPRAY | Freq: Two times a day (BID) | RESPIRATORY_TRACT | Status: DC

## 2013-07-04 MED ORDER — ALBUTEROL SULFATE HFA 108 (90 BASE) MCG/ACT IN AERS: 2.0000 | INHALATION_SPRAY | RESPIRATORY_TRACT | Status: DC | PRN

## 2013-07-04 NOTE — Telephone Encounter (Signed)
Patient is calling to inquire about a referral to see a pulmonary doctor. States that she thinks Dr. Birdie Riddle mentioned referring her to one at her last OV in December.  Patient also is calling to request a refill on her "day and night inhaler" to [place]. Please advise.

## 2013-07-04 NOTE — Telephone Encounter (Signed)
Referral placed and med filled. Pt notified.

## 2013-07-08 ENCOUNTER — Telehealth: Payer: Self-pay | Admitting: Family Medicine

## 2013-07-08 NOTE — Telephone Encounter (Signed)
Patient saw Dr. Linna Griffin back on 05/16/2013 and she states that he recommended to her a Pulmonary doctor with [name] that she should go see. Patient says that Dr. Birdie Griffin has referred her to Pulmonary but there was no specific doctor listed. Patient says she is not wanting to just see "anyone" and wants the recommendation that Dr. Linna Griffin gave her so she can call them back to make her appointment. Please advise.

## 2013-07-08 NOTE — Telephone Encounter (Signed)
LM @ (4:20pm) with the pt's husband to have the pt RTC regarding refer to Pulmonary.//AB/CMA

## 2013-07-10 NOTE — Telephone Encounter (Signed)
Dr Joya Gaskins @ [address]

## 2013-07-10 NOTE — Telephone Encounter (Signed)
Spoke with the pt and she stated that she was seen by Dr. Linna Darner on (05-16-13) and she was told that he would recommend her to a good Pulmonary doctor.  So she would like to go ahead and get that referral.  Please advise.//AB/CMA

## 2013-07-10 NOTE — Telephone Encounter (Signed)
Request for Dr. Joya Gaskins added to existing referral.

## 2013-07-15 ENCOUNTER — Telehealth: Payer: Self-pay | Admitting: Family Medicine

## 2013-07-15 MED ORDER — BUDESONIDE-FORMOTEROL FUMARATE 160-4.5 MCG/ACT IN AERO: 2.0000 | INHALATION_SPRAY | Freq: Two times a day (BID) | RESPIRATORY_TRACT | Status: DC

## 2013-07-15 NOTE — Telephone Encounter (Signed)
Both medications were filled last week. Sent in Symbicort Rx again today.

## 2013-07-15 NOTE — Telephone Encounter (Signed)
Patient states she needs refill on her symbicort. She called last week and we sent in the proair.  Pt uses [facility]

## 2013-08-04 ENCOUNTER — Other Ambulatory Visit: Payer: Self-pay | Admitting: Family Medicine

## 2013-08-05 ENCOUNTER — Institutional Professional Consult (permissible substitution): Payer: Medicare Other | Admitting: Critical Care Medicine

## 2013-08-06 NOTE — Telephone Encounter (Signed)
Med filled.  

## 2013-08-18 ENCOUNTER — Institutional Professional Consult (permissible substitution): Payer: Medicare Other | Admitting: Critical Care Medicine

## 2013-08-26 ENCOUNTER — Encounter: Payer: Self-pay | Admitting: General Practice

## 2013-08-26 DIAGNOSIS — E119 Type 2 diabetes mellitus without complications: Secondary | ICD-10-CM | POA: Diagnosis not present

## 2013-08-26 DIAGNOSIS — H409 Unspecified glaucoma: Secondary | ICD-10-CM | POA: Diagnosis not present

## 2013-08-26 DIAGNOSIS — H4011X0 Primary open-angle glaucoma, stage unspecified: Secondary | ICD-10-CM | POA: Diagnosis not present

## 2013-08-26 DIAGNOSIS — H2510 Age-related nuclear cataract, unspecified eye: Secondary | ICD-10-CM | POA: Diagnosis not present

## 2013-08-26 LAB — HM DIABETES EYE EXAM

## 2013-08-27 ENCOUNTER — Ambulatory Visit (INDEPENDENT_AMBULATORY_CARE_PROVIDER_SITE_OTHER): Payer: Medicare Other | Admitting: Critical Care Medicine

## 2013-08-27 ENCOUNTER — Ambulatory Visit (INDEPENDENT_AMBULATORY_CARE_PROVIDER_SITE_OTHER)
Admission: RE | Admit: 2013-08-27 | Discharge: 2013-08-27 | Disposition: A | Discharge status: Home / Self Care | Payer: Medicare Other | Source: Ambulatory Visit | Attending: Critical Care Medicine | Admitting: Critical Care Medicine

## 2013-08-27 ENCOUNTER — Encounter: Payer: Self-pay | Admitting: Critical Care Medicine

## 2013-08-27 VITALS — BP 132/78 | HR 94 | Temp 98.1°F | Ht 63.5 in | Wt 175.4 lb

## 2013-08-27 DIAGNOSIS — J45909 Unspecified asthma, uncomplicated: Secondary | ICD-10-CM

## 2013-08-27 DIAGNOSIS — R911 Solitary pulmonary nodule: Secondary | ICD-10-CM

## 2013-08-27 DIAGNOSIS — J4540 Moderate persistent asthma, uncomplicated: Secondary | ICD-10-CM

## 2013-08-27 DIAGNOSIS — R918 Other nonspecific abnormal finding of lung field: Secondary | ICD-10-CM | POA: Diagnosis not present

## 2013-08-27 NOTE — Progress Notes (Signed)
Subjective:    Patient ID: [name], female    DOB: [birth year], 73 y.o.   MRN: [id number]  HPI Comments: Asthma dx lifelong  Asthma She complains of chest tightness, difficulty breathing, frequent throat clearing, hoarse voice, shortness of breath and sputum production. There is no cough, hemoptysis or wheezing. Primary symptoms comments: Notes nasal issues. This is a chronic problem. The current episode started more than 1 year ago. The problem occurs intermittently. The problem has been rapidly improving. Associated symptoms include a sore throat. Pertinent negatives include no appetite change, chest pain, dyspnea on exertion, ear congestion, ear pain, fever, headaches, heartburn, malaise/fatigue, myalgias, nasal congestion, orthopnea, PND, postnasal drip, rhinorrhea, sneezing, sweats, trouble swallowing or weight loss. Associated symptoms comments: No GERD symptoms Had sinus surgery 73yrs ago. Her symptoms are aggravated by animal exposure, exposure to fumes, exposure to smoke, pollen and URI (allergy testing: grass, ragweed, mold with wet leaves, food: mushrooms). Her symptoms are alleviated by beta-agonist. She reports significant improvement on treatment. Risk factors for lung disease include smoking/tobacco exposure. Her past medical history is significant for asthma. There is no history of bronchiectasis, bronchitis, COPD, emphysema or pneumonia. Past medical history comments: Bronchitis in 04/2013,  .   Pt was in hosp for bronchitis in 04/2013 Pt had FOB 2011 in [place] .  Past Medical History  Diagnosis Date  . Allergic rhinitis   . Diabetes mellitus   . Urinary tract infection   . Migraines   . Palpitations   . Asthma   . Hyperlipemia   . Kidney stone   . Glaucoma      Family History  Problem Relation Age of Onset  . Diabetes Father   . Colon cancer Neg Hx   . Esophageal cancer Neg Hx   . Rectal cancer Neg Hx   . Stomach cancer Neg Hx   . Breast cancer Neg Hx       History   Social History  . Marital Status: Married    Spouse Name: N/A    Number of Children: 2  . Years of Education: N/A   Occupational History  . Retired -Personal assistant    Social History Main Topics  . Smoking status: Former Smoker -- 0.10 packs/day for 4 years    Types: Cigarettes    Quit date: 06/19/1986  . Smokeless tobacco: Never Used     Comment: smoked in college years ago  . Alcohol Use: 0.0 oz/week     Comment: 1-2 glasses a night variety beer  . Drug Use: No  . Sexual Activity: Not on file   Other Topics Concern  . Not on file   Social History Narrative   Daily caffeine      Allergies  Allergen Reactions  . Papain Anaphylaxis    Takes an extreme concentration  . Papaya Derivatives Anaphylaxis  . Mushroom Ext Cmplx(Shiitake-Reishi-Mait) Other (See Comments)    Head congestion and sore throat  . Penicillins   . Venlafaxine Other (See Comments)    Pt states she can take Name brand. Lethargy     Outpatient Prescriptions Prior to Visit  Medication Sig Dispense Refill  . albuterol (PROAIR HFA) 108 (90 BASE) MCG/ACT inhaler Inhale 2 puffs into the lungs every 4 (four) hours as needed for wheezing or shortness of breath.  1 Inhaler  6  . bimatoprost (LUMIGAN) 0.03 % ophthalmic solution Place 1 drop into the left eye at bedtime.       . Biotin 5000 MCG  CAPS Take 1 capsule by mouth daily.      . cetirizine (ZYRTEC) 10 MG tablet TAKE ONE (1) TABLET EACH DAY  30 tablet  5  . EFFEXOR XR 75 MG 24 hr capsule TAKE ONE (1) CAPSULE EACH DAY  30 capsule  3  . fenofibrate (TRICOR) 145 MG tablet Take 1 tablet (145 mg total) by mouth daily after breakfast.  30 tablet  5  . Insulin Pen Needle 32G X 6 MM MISC 1 each by Does not apply route daily. To inject insulin.  50 each  12  . Liraglutide (VICTOZA) 18 MG/3ML SOPN Inject 1.2 mg into the skin daily.  9 mL  6  . b complex vitamins capsule Take 1 capsule by mouth daily.       . budesonide-formoterol (SYMBICORT) 160-4.5  MCG/ACT inhaler Inhale 2 puffs into the lungs 2 (two) times daily.  1 Inhaler  3  . folic acid (FOLVITE) A999333 MCG tablet Take 400 mcg by mouth daily.      Marland Kitchen levofloxacin (LEVAQUIN) 500 MG tablet Take 1 tablet (500 mg total) by mouth daily.  5 tablet  0  . magnesium oxide (MAG-OX) 400 MG tablet Take 400 mg by mouth daily.      . Melatonin 10 MG CAPS Take 1 capsule by mouth at bedtime.  30 capsule  6  . OVER THE COUNTER MEDICATION Take 1 tablet by mouth 2 (two) times daily. Glucosamine 1500mg  chondroitin 1200mg       . predniSONE (DELTASONE) 10 MG tablet Take 6-5-4-3-2-1 PO daily till gone  21 tablet  0  . promethazine-dextromethorphan (PROMETHAZINE-DM) 6.25-15 MG/5ML syrup Take 5 mLs by mouth 4 (four) times daily as needed for cough.      Marland Kitchen VITAMIN D, CHOLECALCIFEROL, PO Take 1,000 Units by mouth daily.        No facility-administered medications prior to visit.     Review of Systems  Constitutional: Negative for fever, weight loss, malaise/fatigue and appetite change.  HENT: Positive for hoarse voice and sore throat. Negative for ear pain, postnasal drip, rhinorrhea, sneezing and trouble swallowing.   Respiratory: Positive for sputum production and shortness of breath. Negative for cough, hemoptysis and wheezing.   Cardiovascular: Negative for chest pain, dyspnea on exertion and PND.  Gastrointestinal: Negative for heartburn.  Musculoskeletal: Negative for myalgias.  Neurological: Negative for headaches.       Objective:   Physical Exam Filed Vitals:   08/27/13 1148  BP: 132/78  Pulse: 94  Temp: 98.1 F (36.7 C)  TempSrc: Oral  Height: 5' 3.5" (1.613 m)  Weight: 175 lb 6.4 oz (79.561 kg)  SpO2: 97%    Gen: Pleasant, well-nourished, in no distress,  normal affect  ENT: No lesions,  mouth clear,  oropharynx clear, no postnasal drip  Neck: No JVD, no TMG, no carotid bruits  Lungs: No use of accessory muscles, no dullness to percussion, clear without rales or  rhonchi  Cardiovascular: RRR, heart sounds normal, no murmur or gallops, no peripheral edema  Abdomen: soft and NT, no HSM,  BS normal  Musculoskeletal: No deformities, no cyanosis or clubbing  Neuro: alert, non focal  Skin: Warm, no lesions or rashes  Dg Chest 2 View  08/27/2013   CLINICAL DATA Asthma, recent hospitalization for flu  EXAM CHEST  2 VIEW  COMPARISON 05/16/2013  FINDINGS Lungs are essentially clear. Prior left basilar opacity has resolved. No pleural effusion or pneumothorax.  Heart is normal in size.  Degenerative changes of the  visualized thoracolumbar spine.  IMPRESSION No evidence of acute cardiopulmonary disease.  SIGNATURE  Electronically Signed   By: Julian Hy M.D.   On: 08/27/2013 13:56          Assessment & Plan:   Asthma, moderate persistent Moderate persistent asthma stable at present Plan Records from [facility] current prn albuterol program Pt not currently on symbicort and validated that pt can come off symbicort Note spirometry today normal Pt given peak flow meter  Pulmonary nodule, right Trivial lung nodule, doubt malignant   Updated Medication List Outpatient Encounter Prescriptions as of 08/27/2013  Medication Sig  . albuterol (PROAIR HFA) 108 (90 BASE) MCG/ACT inhaler Inhale 2 puffs into the lungs every 4 (four) hours as needed for wheezing or shortness of breath.  . bimatoprost (LUMIGAN) 0.03 % ophthalmic solution Place 1 drop into the left eye at bedtime.   . Biotin 5000 MCG CAPS Take 1 capsule by mouth daily.  . cetirizine (ZYRTEC) 10 MG tablet TAKE ONE (1) TABLET EACH DAY  . EFFEXOR XR 75 MG 24 hr capsule TAKE ONE (1) CAPSULE EACH DAY  . fenofibrate (TRICOR) 145 MG tablet Take 1 tablet (145 mg total) by mouth daily after breakfast.  . Insulin Pen Needle 32G X 6 MM MISC 1 each by Does not apply route daily. To inject insulin.  . Liraglutide (VICTOZA) 18 MG/3ML SOPN Inject 1.2 mg into the skin daily.  . [DISCONTINUED] b complex  vitamins capsule Take 1 capsule by mouth daily.   . [DISCONTINUED] budesonide-formoterol (SYMBICORT) 160-4.5 MCG/ACT inhaler Inhale 2 puffs into the lungs 2 (two) times daily.  . [DISCONTINUED] folic acid (FOLVITE) 191 MCG tablet Take 400 mcg by mouth daily.  . [DISCONTINUED] levofloxacin (LEVAQUIN) 500 MG tablet Take 1 tablet (500 mg total) by mouth daily.  . [DISCONTINUED] magnesium oxide (MAG-OX) 400 MG tablet Take 400 mg by mouth daily.  . [DISCONTINUED] Melatonin 10 MG CAPS Take 1 capsule by mouth at bedtime.  . [DISCONTINUED] OVER THE COUNTER MEDICATION Take 1 tablet by mouth 2 (two) times daily. Glucosamine 1500mg  chondroitin 1200mg   . [DISCONTINUED] predniSONE (DELTASONE) 10 MG tablet Take 6-5-4-3-2-1 PO daily till gone  . [DISCONTINUED] promethazine-dextromethorphan (PROMETHAZINE-DM) 6.25-15 MG/5ML syrup Take 5 mLs by mouth 4 (four) times daily as needed for cough.  . [DISCONTINUED] VITAMIN D, CHOLECALCIFEROL, PO Take 1,000 Units by mouth daily.

## 2013-08-27 NOTE — Patient Instructions (Signed)
Chest xray today Spirometry today Records from Pulmonary dr in [place] will be obtained Start Peak Flow meter Follow up with Dr. Joya Griffin in 3 months

## 2013-08-27 NOTE — Progress Notes (Signed)
Quick Note:  Notify the patient that the Xray is stable and no pneumonia No change in medications are recommended. Continue current meds as prescribed at last office visit ______ 

## 2013-08-28 ENCOUNTER — Telehealth: Payer: Self-pay | Admitting: Critical Care Medicine

## 2013-08-28 NOTE — Telephone Encounter (Signed)
I called and spoke with the pt's spouse and notified that the msg from the pt was received and we will work on getting her records Will forward to [place] per her request

## 2013-08-29 NOTE — Assessment & Plan Note (Addendum)
Moderate persistent asthma stable at present Plan Records from [facility] current prn albuterol program Pt not currently on symbicort and validated that pt can come off symbicort Note spirometry today normal Pt given peak flow meter

## 2013-08-29 NOTE — Telephone Encounter (Signed)
I have faxed the request for these medical records.. Pt aware and voiced no further questions or concerns at this time.

## 2013-08-29 NOTE — Assessment & Plan Note (Signed)
Trivial lung nodule, doubt malignant

## 2013-09-01 ENCOUNTER — Encounter: Payer: Self-pay | Admitting: Internal Medicine

## 2013-09-01 ENCOUNTER — Ambulatory Visit (INDEPENDENT_AMBULATORY_CARE_PROVIDER_SITE_OTHER): Payer: Medicare Other | Admitting: Internal Medicine

## 2013-09-01 VITALS — BP 140/70 | HR 102 | Temp 97.5°F | Resp 13 | Wt 175.0 lb

## 2013-09-01 DIAGNOSIS — J209 Acute bronchitis, unspecified: Secondary | ICD-10-CM

## 2013-09-01 DIAGNOSIS — J069 Acute upper respiratory infection, unspecified: Secondary | ICD-10-CM

## 2013-09-01 MED ORDER — SULFAMETHOXAZOLE-TMP DS 800-160 MG PO TABS: 1.0000 | ORAL_TABLET | Freq: Two times a day (BID) | ORAL | Status: DC

## 2013-09-01 MED ORDER — FLUTICASONE-SALMETEROL 250-50 MCG/DOSE IN AEPB: 1.0000 | INHALATION_SPRAY | Freq: Two times a day (BID) | RESPIRATORY_TRACT | Status: DC

## 2013-09-01 NOTE — Patient Instructions (Signed)
Plain Mucinex (NOT D) for thick secretions ;force NON dairy fluids .   Nasal cleansing in the shower as discussed with lather of mild shampoo.After 10 seconds wash off lather while  exhaling through nostrils. Make sure that all residual soap is removed to prevent irritation.  Flonase OR Nasacort AQ 1 spray in each nostril twice a day as needed. Use the "crossover" technique into opposite nostril spraying toward opposite ear @ 45 degree angle, not straight up into nostril.  Use a Neti pot daily only  as needed for significant sinus congestion; going from open side to congested side . Plain Allegra (NOT D )  160 daily , Loratidine 10 mg , OR Zyrtec 10 mg @ bedtime  as needed for itchy eyes & sneezing.  Advair one inhalation every 12 hours; gargle and spit after use 

## 2013-09-01 NOTE — Progress Notes (Signed)
   Subjective:    Patient ID: [name], female    DOB: July 27, 1940, 73 y.o.   MRN: [id number]  HPI Sore throat began one week ago. A few days later she began having head/chest congestion, cough (productve of yellow sputum especially in early am), and runny nose (light yellow). Also has itchy, watery eyes. Feels like she has chills/fever at times. Feels pressure in ears and feel "itchy". Has used Nyquil at night.  Has been around friends who are sick.  Smoked 25 years ago for 3-4 years a couple of cigarettes/day.  Review of Systems Constitutional: Positive for fever (had low grade fever yesterday but none today) and chills.  HENT: Positive for congestion, ear pain and postnasal drip. Negative for sinus pressure.  Respiratory: Positive for wheezing. Negative for shortness of breath.  Musculoskeletal: Negative for arthralgias.         Objective:   Physical Exam  She appears well-developed and well-nourished. No distress.  HENT:  Head: Normocephalic and atraumatic.  Right Ear: Tympanic membrane normal.  Left Ear: Tympanic membrane normal.  Mouth/Throat: Oropharynx is clear and moist. No oropharyngeal exudate.  Eyes: Pupils are equal, round, and reactive to light. No scleral icterus.  Neck: Neck supple.  Cardiovascular: Normal rate, regular rhythm and normal heart sounds. Exam reveals no gallop and no friction rub.  No murmur heard.  Pulmonary/Chest: Effort normal. Scattered musical rhonchi.  Lymphadenopathy:  She has no cervical adenopathy.  Neurological: She is alert and oriented to person, place, and time.  Skin: Skin is warm and dry. She is not diaphoretic.         Assessment & Plan:     #1 acute bronchitis with bronchospasm #2 URI, acute Plan: See orders and recommendations

## 2013-09-01 NOTE — Progress Notes (Signed)
Pre visit review using our clinic review tool, if applicable. No additional management support is needed unless otherwise documented below in the visit note. 

## 2013-09-01 NOTE — Progress Notes (Signed)
   Subjective:    Patient ID: [name], female    DOB: June 14, 1941, 73 y.o.   MRN: [id number]  HPI Sore throat began one week ago. A few days later she began having head/chest congestion, cough (productve of yellow sputum especially in early am), and runny nose (light yellow). Also has itchy, watery eyes. Feels like she has chills/fever at times. Feels pressure in ears and feel "itchy". Has been around friends who are sick. Smoked 25 years ago for 3-4 years a couple of cigarettes/day.   Review of Systems  Constitutional: Positive for fever (had low grade fever yesterday but none today) and chills.  HENT: Positive for congestion, ear pain and postnasal drip. Negative for sinus pressure.   Respiratory: Positive for wheezing. Negative for shortness of breath.   Musculoskeletal: Negative for arthralgias.       Objective:   Physical Exam  Constitutional: She is oriented to person, place, and time. She appears well-developed and well-nourished. No distress.  HENT:  Head: Normocephalic and atraumatic.  Right Ear: Tympanic membrane normal.  Left Ear: Tympanic membrane normal.  Mouth/Throat: Oropharynx is clear and moist. No oropharyngeal exudate.  Eyes: Pupils are equal, round, and reactive to light. No scleral icterus.  Neck: Neck supple.  Cardiovascular: Normal rate, regular rhythm and normal heart sounds.  Exam reveals no gallop and no friction rub.   No murmur heard. Pulmonary/Chest: Effort normal. Wheezes: scattered, clear with cough, deep breathes causes coughing.  Lymphadenopathy:    She has no cervical adenopathy.  Neurological: She is alert and oriented to person, place, and time.  Skin: Skin is warm and dry. She is not diaphoretic.          Assessment & Plan:

## 2013-09-04 ENCOUNTER — Telehealth: Payer: Self-pay | Admitting: Critical Care Medicine

## 2013-09-04 NOTE — Telephone Encounter (Signed)
Rec'd from [place] [facility]  forward 35 pages to [place]

## 2013-09-05 ENCOUNTER — Encounter: Payer: Self-pay | Admitting: Critical Care Medicine

## 2013-09-09 ENCOUNTER — Encounter: Payer: Self-pay | Admitting: Family Medicine

## 2013-09-09 ENCOUNTER — Ambulatory Visit (INDEPENDENT_AMBULATORY_CARE_PROVIDER_SITE_OTHER): Payer: Medicare Other | Admitting: Family Medicine

## 2013-09-09 VITALS — BP 128/84 | HR 86 | Temp 98.4°F | Resp 16 | Wt 175.1 lb

## 2013-09-09 DIAGNOSIS — J42 Unspecified chronic bronchitis: Secondary | ICD-10-CM

## 2013-09-09 DIAGNOSIS — J209 Acute bronchitis, unspecified: Secondary | ICD-10-CM | POA: Diagnosis not present

## 2013-09-09 DIAGNOSIS — J309 Allergic rhinitis, unspecified: Secondary | ICD-10-CM

## 2013-09-09 MED ORDER — FLUTICASONE-SALMETEROL 250-50 MCG/DOSE IN AEPB: 1.0000 | INHALATION_SPRAY | Freq: Two times a day (BID) | RESPIRATORY_TRACT | Status: DC

## 2013-09-09 MED ORDER — PROMETHAZINE-DM 6.25-15 MG/5ML PO SYRP: 5.0000 mL | ORAL_SOLUTION | Freq: Four times a day (QID) | ORAL | Status: DC | PRN

## 2013-09-09 MED ORDER — DOXYCYCLINE HYCLATE 100 MG PO TABS: 100.0000 mg | ORAL_TABLET | Freq: Two times a day (BID) | ORAL | Status: DC

## 2013-09-09 MED ORDER — POLYETHYLENE GLYCOL 3350 17 GM/SCOOP PO POWD: 17.0000 g | Freq: Every day | ORAL | Status: DC

## 2013-09-09 NOTE — Patient Instructions (Signed)
Follow up as needed STOP the Bactrim START the Doxycycline twice daily- take w/ food Start the Miralax once daily to improve constipation Drink plenty of fluids Continue the Advair- 1 puff twice daily START Zyrtec daily to improve the nasal congestion, post-nasal drip and cough Use the cough syrup at night- will cause drowsiness Call with any questions or concerns- particularly if not improving Hang in there!!

## 2013-09-09 NOTE — Progress Notes (Signed)
   Subjective:    Patient ID: [name], female    DOB: [birth year], 73 y.o.   MRN: [id number]  HPI Asthma exacerbation- pt was seen by Dr Linna Darner on 3/16 and started on Bactrim, Advair.  Pt reports she has not been sleeping b/c she is waking up coughing.  'gave in and took Nyquil last night'.  Increased nasal congestion, drainage.  Also complaining that Bactrim caused constipation- tx'ing w/ OTC meds w/ minimal improvement.  Needs Advair script 250/50- was given a sample at [facility].  Not currently on Zyrtec.   Review of Systems For ROS see HPI     Objective:   Physical Exam  Vitals reviewed. Constitutional: She appears well-developed and well-nourished. No distress.  HENT:  Head: Normocephalic and atraumatic.  Right Ear: Tympanic membrane normal.  Left Ear: Tympanic membrane normal.  Nose: Mucosal edema and rhinorrhea present. Right sinus exhibits no maxillary sinus tenderness and no frontal sinus tenderness. Left sinus exhibits no maxillary sinus tenderness and no frontal sinus tenderness.  Mouth/Throat: Mucous membranes are normal. Posterior oropharyngeal erythema (w/ PND) present.  Eyes: Conjunctivae and EOM are normal. Pupils are equal, round, and reactive to light.  Neck: Normal range of motion. Neck supple.  Cardiovascular: Normal rate, regular rhythm and normal heart sounds.   Pulmonary/Chest: Effort normal. No respiratory distress. She has no wheezes. She has rales (rhonchi in RLL).  Deep, hacking cough  Lymphadenopathy:    She has no cervical adenopathy.          Assessment & Plan:

## 2013-09-09 NOTE — Assessment & Plan Note (Signed)
Deteriorated.  Pt not currently on zyrtec.  Encouraged her to restart as spring allergies are in full swing.  Suspect this is contributing to her cough/asthma exacerbation.  Will follow.

## 2013-09-09 NOTE — Assessment & Plan Note (Signed)
Refill provided on Advair.  Due to acute exacerbation, continue abx but since bactrim is causing constipation, will switch to Doxy.  Cough meds prn.  Reviewed supportive care and red flags that should prompt return.  Pt expressed understanding and is in agreement w/ plan.

## 2013-09-09 NOTE — Progress Notes (Signed)
Pre visit review using our clinic review tool, if applicable. No additional management support is needed unless otherwise documented below in the visit note. 

## 2013-09-09 NOTE — Assessment & Plan Note (Signed)
Pt's hx of underlying lung disease and undertreated allergies have set pt up for acute bronchitis.  D/c Bactrim due to side effects.  Start Doxy.  Reviewed supportive care and red flags that should prompt return.  Pt expressed understanding and is in agreement w/ plan.

## 2013-09-10 ENCOUNTER — Telehealth: Payer: Self-pay

## 2013-09-10 ENCOUNTER — Encounter (HOSPITAL_BASED_OUTPATIENT_CLINIC_OR_DEPARTMENT_OTHER): Payer: Self-pay | Admitting: Emergency Medicine

## 2013-09-10 ENCOUNTER — Emergency Department (HOSPITAL_BASED_OUTPATIENT_CLINIC_OR_DEPARTMENT_OTHER)
Admission: EM | Admit: 2013-09-10 | Discharge: 2013-09-10 | Disposition: A | Discharge status: Home / Self Care | Payer: Medicare Other | Attending: Emergency Medicine | Admitting: Emergency Medicine

## 2013-09-10 ENCOUNTER — Emergency Department (HOSPITAL_BASED_OUTPATIENT_CLINIC_OR_DEPARTMENT_OTHER): Payer: Medicare Other

## 2013-09-10 DIAGNOSIS — Z9089 Acquired absence of other organs: Secondary | ICD-10-CM | POA: Diagnosis not present

## 2013-09-10 DIAGNOSIS — Z794 Long term (current) use of insulin: Secondary | ICD-10-CM | POA: Diagnosis not present

## 2013-09-10 DIAGNOSIS — Z87891 Personal history of nicotine dependence: Secondary | ICD-10-CM | POA: Diagnosis not present

## 2013-09-10 DIAGNOSIS — Z880 Allergy status to penicillin: Secondary | ICD-10-CM | POA: Insufficient documentation

## 2013-09-10 DIAGNOSIS — Z79899 Other long term (current) drug therapy: Secondary | ICD-10-CM | POA: Insufficient documentation

## 2013-09-10 DIAGNOSIS — K560 Paralytic ileus: Secondary | ICD-10-CM | POA: Insufficient documentation

## 2013-09-10 DIAGNOSIS — Z87440 Personal history of urinary (tract) infections: Secondary | ICD-10-CM | POA: Diagnosis not present

## 2013-09-10 DIAGNOSIS — J45909 Unspecified asthma, uncomplicated: Secondary | ICD-10-CM | POA: Diagnosis not present

## 2013-09-10 DIAGNOSIS — K5900 Constipation, unspecified: Secondary | ICD-10-CM | POA: Insufficient documentation

## 2013-09-10 DIAGNOSIS — R110 Nausea: Secondary | ICD-10-CM | POA: Insufficient documentation

## 2013-09-10 DIAGNOSIS — E785 Hyperlipidemia, unspecified: Secondary | ICD-10-CM | POA: Diagnosis not present

## 2013-09-10 DIAGNOSIS — H409 Unspecified glaucoma: Secondary | ICD-10-CM | POA: Diagnosis not present

## 2013-09-10 DIAGNOSIS — R109 Unspecified abdominal pain: Secondary | ICD-10-CM | POA: Insufficient documentation

## 2013-09-10 DIAGNOSIS — Z87442 Personal history of urinary calculi: Secondary | ICD-10-CM | POA: Insufficient documentation

## 2013-09-10 DIAGNOSIS — E119 Type 2 diabetes mellitus without complications: Secondary | ICD-10-CM | POA: Insufficient documentation

## 2013-09-10 DIAGNOSIS — Z792 Long term (current) use of antibiotics: Secondary | ICD-10-CM | POA: Insufficient documentation

## 2013-09-10 DIAGNOSIS — K7689 Other specified diseases of liver: Secondary | ICD-10-CM | POA: Diagnosis not present

## 2013-09-10 DIAGNOSIS — IMO0002 Reserved for concepts with insufficient information to code with codable children: Secondary | ICD-10-CM | POA: Diagnosis not present

## 2013-09-10 DIAGNOSIS — K567 Ileus, unspecified: Secondary | ICD-10-CM

## 2013-09-10 DIAGNOSIS — R1084 Generalized abdominal pain: Secondary | ICD-10-CM | POA: Diagnosis not present

## 2013-09-10 LAB — URINALYSIS, ROUTINE W REFLEX MICROSCOPIC
Bilirubin Urine: NEGATIVE
Glucose, UA: NEGATIVE mg/dL
Hgb urine dipstick: NEGATIVE
Ketones, ur: NEGATIVE mg/dL
LEUKOCYTES UA: NEGATIVE
NITRITE: NEGATIVE
Protein, ur: NEGATIVE mg/dL
SPECIFIC GRAVITY, URINE: 1.015 (ref 1.005–1.030)
Urobilinogen, UA: 0.2 mg/dL (ref 0.0–1.0)
pH: 5.5 (ref 5.0–8.0)

## 2013-09-10 LAB — COMPREHENSIVE METABOLIC PANEL
ALBUMIN: 4.2 g/dL (ref 3.5–5.2)
ALT: 25 U/L (ref 0–35)
AST: 22 U/L (ref 0–37)
Alkaline Phosphatase: 66 U/L (ref 39–117)
BUN: 13 mg/dL (ref 6–23)
CO2: 25 mEq/L (ref 19–32)
CREATININE: 0.9 mg/dL (ref 0.50–1.10)
Calcium: 10.3 mg/dL (ref 8.4–10.5)
Chloride: 102 mEq/L (ref 96–112)
GFR calc Af Amer: 72 mL/min — ABNORMAL LOW (ref >90)
GFR calc non Af Amer: 62 mL/min — ABNORMAL LOW (ref >90)
Glucose, Bld: 100 mg/dL — ABNORMAL HIGH (ref 70–99)
POTASSIUM: 3.8 meq/L (ref 3.7–5.3)
Sodium: 142 mEq/L (ref 137–147)
TOTAL PROTEIN: 7.4 g/dL (ref 6.0–8.3)
Total Bilirubin: 0.2 mg/dL — ABNORMAL LOW (ref 0.3–1.2)

## 2013-09-10 LAB — CBC WITH DIFFERENTIAL/PLATELET
BASOS PCT: 1 % (ref 0–1)
Basophils Absolute: 0.1 10*3/uL (ref 0.0–0.1)
EOS ABS: 0.2 10*3/uL (ref 0.0–0.7)
Eosinophils Relative: 2 % (ref 0–5)
HCT: 39.7 % (ref 36.0–46.0)
Hemoglobin: 13.4 g/dL (ref 12.0–15.0)
Lymphocytes Relative: 26 % (ref 12–46)
Lymphs Abs: 1.7 10*3/uL (ref 0.7–4.0)
MCH: 30.1 pg (ref 26.0–34.0)
MCHC: 33.8 g/dL (ref 30.0–36.0)
MCV: 89.2 fL (ref 78.0–100.0)
MONO ABS: 0.4 10*3/uL (ref 0.1–1.0)
Monocytes Relative: 6 % (ref 3–12)
NEUTROS ABS: 4.2 10*3/uL (ref 1.7–7.7)
NEUTROS PCT: 65 % (ref 43–77)
Platelets: 305 10*3/uL (ref 150–400)
RBC: 4.45 MIL/uL (ref 3.87–5.11)
RDW: 13.2 % (ref 11.5–15.5)
WBC: 6.5 10*3/uL (ref 4.0–10.5)

## 2013-09-10 LAB — LIPASE, BLOOD: LIPASE: 45 U/L (ref 11–59)

## 2013-09-10 MED ORDER — IOHEXOL 300 MG/ML  SOLN
100.0000 mL | Freq: Once | INTRAMUSCULAR | Status: AC | PRN
  Administered 2013-09-10: 100 mL via INTRAVENOUS

## 2013-09-10 MED ORDER — PEG 3350-KCL-NABCB-NACL-NASULF 240 G PO SOLR: 4000.0000 mL | Freq: Once | ORAL | Status: DC

## 2013-09-10 MED ORDER — IOHEXOL 300 MG/ML  SOLN
50.0000 mL | Freq: Once | INTRAMUSCULAR | Status: AC | PRN
  Administered 2013-09-10: 50 mL via ORAL

## 2013-09-10 MED ORDER — ONDANSETRON HCL 4 MG/2ML IJ SOLN
4.0000 mg | Freq: Once | INTRAMUSCULAR | Status: AC
  Administered 2013-09-10: 4 mg via INTRAVENOUS
  Filled 2013-09-10: qty 2

## 2013-09-10 MED ORDER — SODIUM CHLORIDE 0.9 % IV SOLN
1000.0000 mL | Freq: Once | INTRAVENOUS | Status: AC
  Administered 2013-09-10: 1000 mL via INTRAVENOUS

## 2013-09-10 NOTE — Discharge Instructions (Signed)
As discussed, your evaluation today has been largely reassuring.  But, it is important that you monitor your condition carefully, and do not hesitate to return to the ED if you develop new, or concerning changes in your condition.  If you choose not to use the newly prescribed medication immediately,   Otherwise, please follow-up with your physician for appropriate ongoing care.  If you chose to not use the provided medication immediately, please continue to use Miralax and suppositories as discussed.

## 2013-09-10 NOTE — ED Notes (Signed)
Patient transported to CT 

## 2013-09-10 NOTE — Telephone Encounter (Signed)
Advised patient per recommendations. States she will go to [facility] [facility] now.

## 2013-09-10 NOTE — Telephone Encounter (Signed)
Based on abd distension, severe pain, and fact that she retained enema, this could be obstruction.  Needs to go to ER for immediate evaluation

## 2013-09-10 NOTE — Telephone Encounter (Signed)
Patient called and stated that she tried an enema to move her bowels. She states that she retain the majority of the enema with no bowel movement. While eating lunch today she felt as if it was coming back up so she stopped eating. In a great deal of pain with abdominal.  [phone]  Please advise.

## 2013-09-10 NOTE — Telephone Encounter (Signed)
Patient called to inquire if she could use a Fleet's enema while using the Miralax. States that her bowels are so impacted that she cannot get anything to move and she is miserable. States that she is unable to tighten her pants due to the pressure in her bowels. Was given the Miralax at 3/25 visit. Patient only taken one dose. Advised that she could use the enema but only once. To continue using the Miralax as directed. Lot's of fluid and activity. Call with questions or concerns. Patient agrees with plan

## 2013-09-10 NOTE — ED Notes (Signed)
C/o constiaption-no LBM 4-5 days-no relief with enema and suppositories

## 2013-09-10 NOTE — ED Provider Notes (Signed)
CSN: [id number]     Arrival date & time 09/10/13  1511 History   First MD Initiated Contact with Patient 09/10/13 1549     Chief Complaint  Patient presents with  . Constipation     HPI  Patient presents with concern of abdominal distention, pressure, nausea. Symptoms began within the past week.  Since onset and has been progressive diffuse abdominal discomfort, described as pressure, and distention with 5 pound weight gait. Patient has had minimal bowel production in the past few days in spite of using suppositories, enema, oral medication. There is associated nausea, and postprandial exacerbation of this. There is no associated chest pain, lightheadedness, syncope, fever. Last colonoscopy was approximately 4 years ago. Patient notes a history one year ago of"obstruction"but denies history of abdominal surgery. She also complains of sinusitis symptoms, though states that these are improved substantially over the past week.   Past Medical History  Diagnosis Date  . Allergic rhinitis   . Diabetes mellitus   . Urinary tract infection   . Migraines   . Palpitations   . Asthma   . Hyperlipemia   . Kidney stone   . Glaucoma    Past Surgical History  Procedure Laterality Date  . Rotator cuff repair      right  . Tonsillectomy    . Abdominal hysterectomy      Has ovaries  . Deviated septum repair    . Lithotripsy    . Back surgery  1987    lumb  . Colonoscopy    . Closed reduction nasal fracture  04/29/2012    Procedure: CLOSED REDUCTION NASAL FRACTURE;  Surgeon: Jodi Marble, MD;  Location: [facility];  Service: ENT;  Laterality: N/A;  WITH STABILIZATION   Family History  Problem Relation Age of Onset  . Diabetes Father   . Colon cancer Neg Hx   . Esophageal cancer Neg Hx   . Rectal cancer Neg Hx   . Stomach cancer Neg Hx   . Breast cancer Neg Hx    History  Substance Use Topics  . Smoking status: Former Smoker -- 0.10 packs/day for 4 years   Types: Cigarettes    Quit date: 06/19/1986  . Smokeless tobacco: Never Used     Comment: smoked in college years ago  . Alcohol Use: 0.0 oz/week     Comment: 1-2 glasses a night variety beer   OB History   Grav Para Term Preterm Abortions TAB SAB Ect Mult Living   2 2             Review of Systems  Constitutional:       Per HPI, otherwise negative  HENT:       Per HPI, otherwise negative  Respiratory:       Per HPI, otherwise negative  Cardiovascular:       Per HPI, otherwise negative  Gastrointestinal: Positive for nausea, abdominal pain and constipation. Negative for vomiting.  Endocrine:       Negative aside from HPI  Genitourinary:       Neg aside from HPI   Musculoskeletal:       Per HPI, otherwise negative  Skin: Negative.   Neurological: Negative for syncope.      Allergies  Papain; Papaya derivatives; Mushroom ext cmplx(shiitake-reishi-mait); Penicillins; and Venlafaxine  Home Medications   Current Outpatient Rx  Name  Route  Sig  Dispense  Refill  . albuterol (PROAIR HFA) 108 (90 BASE) MCG/ACT inhaler   Inhalation  Inhale 2 puffs into the lungs every 4 (four) hours as needed for wheezing or shortness of breath.   1 Inhaler   6   . bimatoprost (LUMIGAN) 0.03 % ophthalmic solution   Left Eye   Place 1 drop into the left eye at bedtime.          . Biotin 5000 MCG CAPS   Oral   Take 1 capsule by mouth daily.         . cetirizine (ZYRTEC) 10 MG tablet      TAKE ONE (1) TABLET EACH DAY   30 tablet   5   . doxycycline (VIBRA-TABS) 100 MG tablet   Oral   Take 1 tablet (100 mg total) by mouth 2 (two) times daily.   20 tablet   0   . EFFEXOR XR 75 MG 24 hr capsule      TAKE ONE (1) CAPSULE EACH DAY   30 capsule   3     Dispense as written.   . fenofibrate (TRICOR) 145 MG tablet   Oral   Take 1 tablet (145 mg total) by mouth daily after breakfast.   30 tablet   5   . Fluticasone-Salmeterol (ADVAIR DISKUS) 250-50 MCG/DOSE AEPB    Inhalation   Inhale 1 puff into the lungs 2 (two) times daily.   60 each   3   . Insulin Pen Needle 32G X 6 MM MISC   Does not apply   1 each by Does not apply route daily. To inject insulin.   50 each   12     DX. 250.00   . Liraglutide (VICTOZA) 18 MG/3ML SOPN   Subcutaneous   Inject 1.2 mg into the skin daily.   9 mL   6   . polyethylene glycol powder (GLYCOLAX/MIRALAX) powder   Oral   Take 17 g by mouth daily.   3350 g   1   . promethazine-dextromethorphan (PROMETHAZINE-DM) 6.25-15 MG/5ML syrup   Oral   Take 5 mLs by mouth 4 (four) times daily as needed.   240 mL   0    BP 125/67  Pulse 90  Temp(Src) 97.6 F (36.4 C) (Oral)  Resp 20  Ht 5\' 3"  (1.6 m)  Wt 175 lb (79.379 kg)  BMI 31.01 kg/m2  SpO2 99% Physical Exam  Nursing note and vitals reviewed. Constitutional: She is oriented to person, place, and time. She appears well-developed and well-nourished. No distress.  HENT:  Head: Normocephalic and atraumatic.  Eyes: Conjunctivae and EOM are normal.  Cardiovascular: Normal rate and regular rhythm.   Pulmonary/Chest: Effort normal and breath sounds normal. No stridor. No respiratory distress.  Abdominal: Bowel sounds are normal. She exhibits no distension. There is generalized tenderness. There is no rigidity, no rebound and no guarding.  Musculoskeletal: She exhibits no edema.  Neurological: She is alert and oriented to person, place, and time. No cranial nerve deficit.  Skin: Skin is warm and dry.  Psychiatric: She has a normal mood and affect.    ED Course  Procedures (including critical care time) Labs Review Labs Reviewed  COMPREHENSIVE METABOLIC PANEL - Abnormal; Notable for the following:    Glucose, Bld 100 (*)    Total Bilirubin 0.2 (*)    GFR calc non Af Amer 62 (*)    GFR calc Af Amer 72 (*)    All other components within normal limits  CBC WITH DIFFERENTIAL  LIPASE, BLOOD  URINALYSIS, ROUTINE W REFLEX MICROSCOPIC  Imaging Review Ct  Abdomen Pelvis W Contrast  09/10/2013   CLINICAL DATA:  Abdominal pain and constipation  EXAM: CT ABDOMEN AND PELVIS WITH CONTRAST  TECHNIQUE: Multidetector CT imaging of the abdomen and pelvis was performed using the standard protocol following bolus administration of intravenous contrast.  CONTRAST:  64mL OMNIPAQUE IOHEXOL 300 MG/ML SOLN, 114mL OMNIPAQUE IOHEXOL 300 MG/ML SOLN  COMPARISON:  None.  FINDINGS: Lung bases are free of acute infiltrate or sizable effusion. Minimal scarring is noted in the bases bilaterally.  The liver is diffusely fatty infiltrated. The gallbladder, spleen, adrenal glands and pancreas are normal in their CT appearance. The kidneys are well visualized bilaterally and reveal no evidence of renal calculi or urinary tract obstructive changes. Mild calcifications of the abdominal aorta are noted. No aneurysmal dilatation is seen. The appendix is well visualized and within normal limits. The bladder is well distended. No pelvic mass lesion or sidewall abnormality is. The uterus has been surgically removed. No pelvic mass lesion is noted. No acute bony abnormality is noted.  IMPRESSION: No acute abnormality seen.  Fatty infiltration of the liver.   Electronically Signed   By: Inez Catalina M.D.   On: 09/10/2013 17:44   6:00 PM On repeat exam the patient is sitting up, reading a magazine, clearly in no distress. I discussed all findings with her and her husband. Advocated for initiation of bowel cleaning a regimen.  Patient has an engagement tomorrow, and may delay treatment for one day, and in the interim will use less substantial treatments. MDM  This patient presents with abdominal pain, concern of bowel obstruction. Patient has had prior obstruction, has a hysterectomy as risk factors. On exam patient is awake and alert, with a tender abdomen.  No evidence of peritonitis, no fever, no leukocytosis, and low suspicion for ongoing infection.  Patient's CT scan was reassuring.  After a  long conversation on appropriate medication use the patient was discharged in stable condition.    Carmin Muskrat, MD 09/10/13 [phone]

## 2013-09-12 ENCOUNTER — Telehealth: Payer: Self-pay

## 2013-09-12 NOTE — Telephone Encounter (Signed)
Patient called and wanted a nurse to go over her medications with her.  She was most concerned about taking zyrtec and guaifenesin together.  She was instructed that it was okay to take together as one is an antihistamine and the other is an expectorant.  She was encouraged to take the guaifenesin only as needed and to drink plenty of fluids.  The fluids will help to loosen congestion in chest.  Patient stated understanding.  No further questions or concerns voiced.

## 2013-09-19 ENCOUNTER — Other Ambulatory Visit: Payer: Self-pay | Admitting: Family Medicine

## 2013-09-22 NOTE — Telephone Encounter (Signed)
Last ov 09/10/13

## 2013-09-22 NOTE — Telephone Encounter (Signed)
Ok for cough syrup but no refills on doxy (abx)

## 2013-09-22 NOTE — Telephone Encounter (Signed)
Med filled. Doxy refused.

## 2013-09-25 ENCOUNTER — Other Ambulatory Visit: Payer: Self-pay

## 2013-09-26 DIAGNOSIS — R079 Chest pain, unspecified: Secondary | ICD-10-CM | POA: Diagnosis not present

## 2013-09-26 DIAGNOSIS — E119 Type 2 diabetes mellitus without complications: Secondary | ICD-10-CM | POA: Diagnosis not present

## 2013-09-26 DIAGNOSIS — K660 Peritoneal adhesions (postprocedural) (postinfection): Secondary | ICD-10-CM | POA: Diagnosis not present

## 2013-09-26 DIAGNOSIS — K8020 Calculus of gallbladder without cholecystitis without obstruction: Secondary | ICD-10-CM | POA: Diagnosis not present

## 2013-09-26 DIAGNOSIS — E7800 Pure hypercholesterolemia, unspecified: Secondary | ICD-10-CM | POA: Diagnosis not present

## 2013-09-26 DIAGNOSIS — K810 Acute cholecystitis: Secondary | ICD-10-CM | POA: Diagnosis not present

## 2013-09-26 DIAGNOSIS — R0789 Other chest pain: Secondary | ICD-10-CM | POA: Diagnosis not present

## 2013-09-26 DIAGNOSIS — K8010 Calculus of gallbladder with chronic cholecystitis without obstruction: Secondary | ICD-10-CM | POA: Diagnosis not present

## 2013-09-26 DIAGNOSIS — K7689 Other specified diseases of liver: Secondary | ICD-10-CM | POA: Diagnosis not present

## 2013-09-26 DIAGNOSIS — K828 Other specified diseases of gallbladder: Secondary | ICD-10-CM | POA: Diagnosis not present

## 2013-09-26 DIAGNOSIS — K429 Umbilical hernia without obstruction or gangrene: Secondary | ICD-10-CM | POA: Diagnosis not present

## 2013-09-26 DIAGNOSIS — R109 Unspecified abdominal pain: Secondary | ICD-10-CM | POA: Diagnosis not present

## 2013-09-26 DIAGNOSIS — M25519 Pain in unspecified shoulder: Secondary | ICD-10-CM | POA: Diagnosis not present

## 2013-09-26 HISTORY — PX: CHOLECYSTECTOMY: SHX55

## 2013-09-27 IMAGING — CR DG ABDOMEN 1V
1 series · 1 of 1 positions shown · non-contrast
Comparison: 08/23/2011.

CLINICAL DATA: Pre lithotripsy.  Left renal calculus.

ABDOMEN - 1 VIEW

[t abdomen supine]
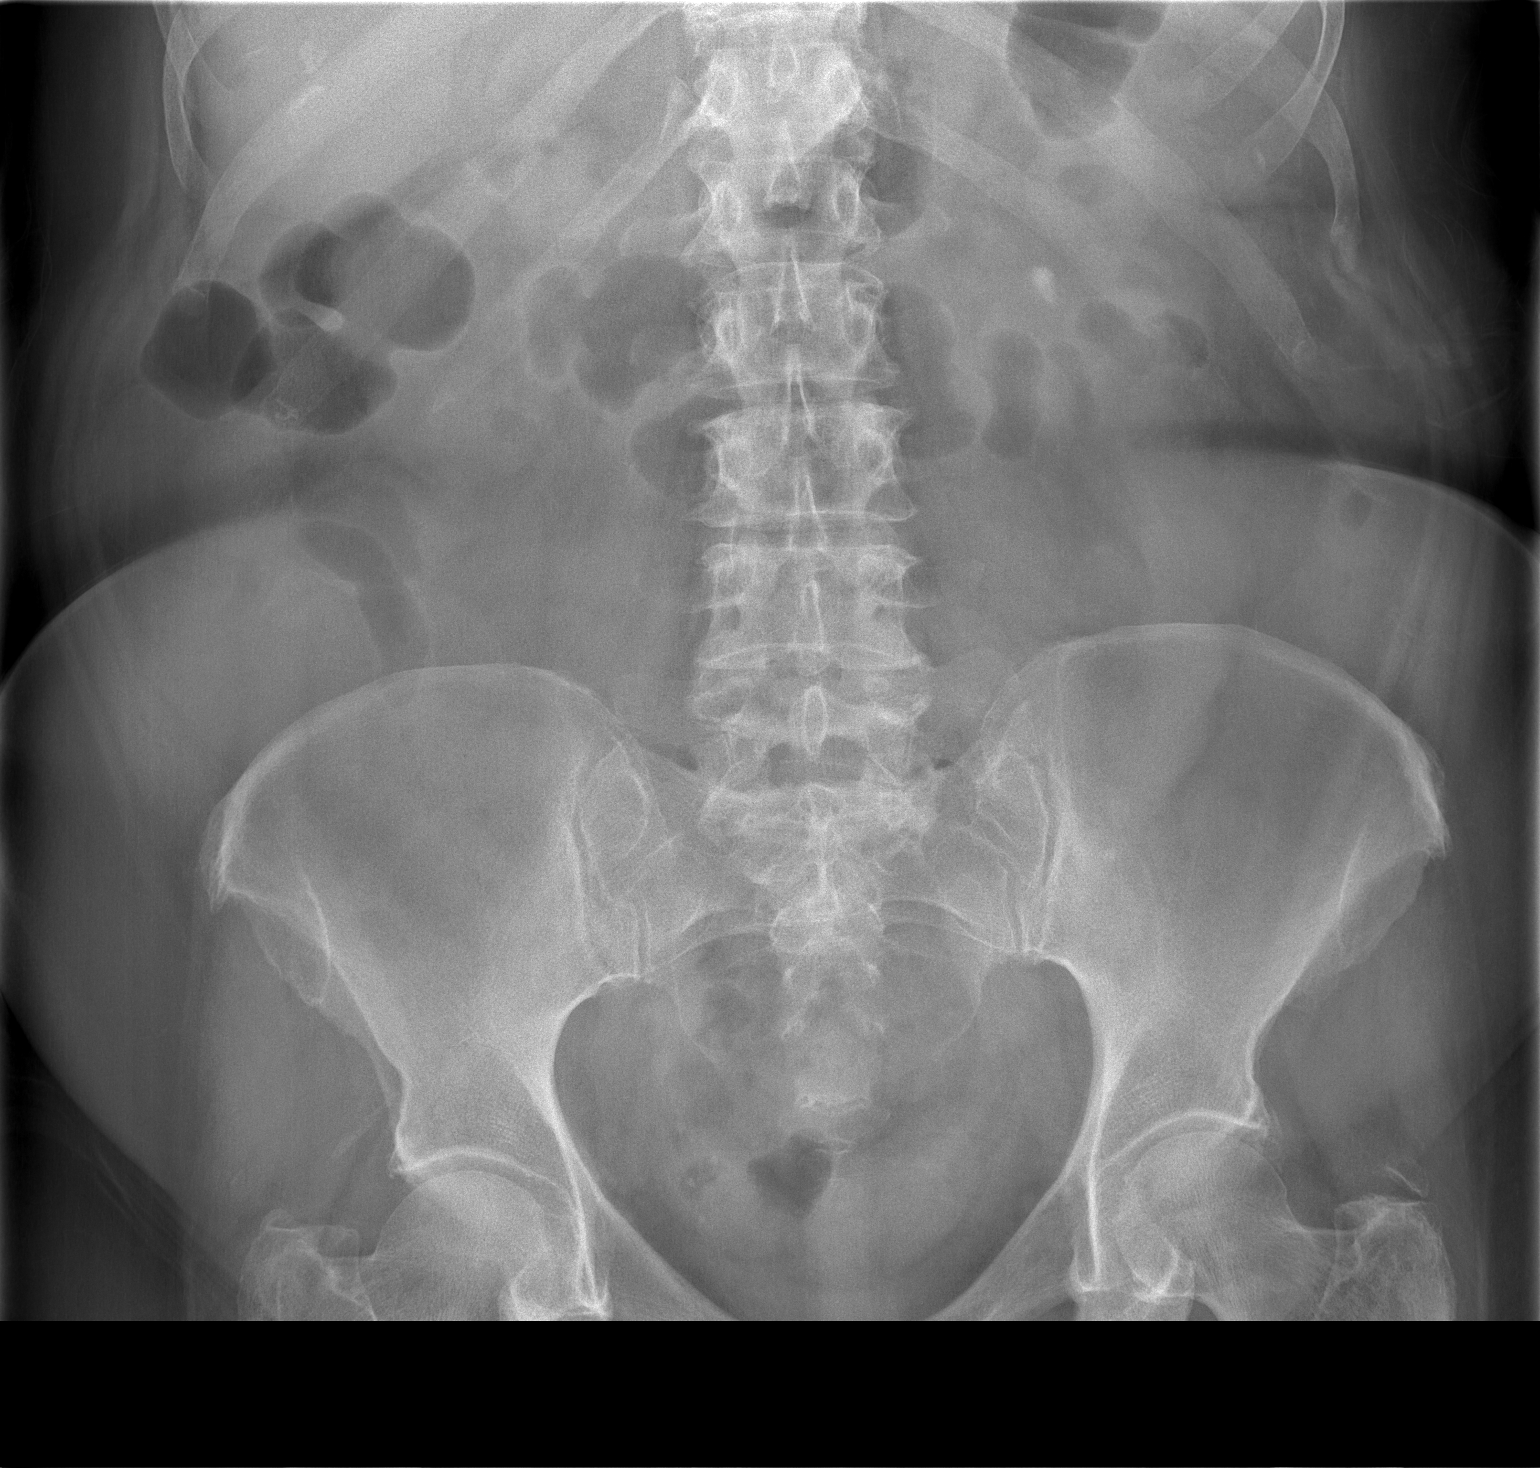

[1 of 1 positions shown; findings below may reference images not displayed]

FINDINGS: Left sided calculus is present measuring 13 mm long axis.
This overlies the interpolar left renal shadow and left renal
pelvis.  No definite right-sided renal calculi are identified.  No
ureteral calculi.  The calcified phleboliths are present in the
right anatomic pelvis.
IMPRESSION: 13 mm left renal calculus.

## 2013-09-28 ENCOUNTER — Encounter: Payer: Self-pay | Admitting: Family Medicine

## 2013-09-29 ENCOUNTER — Encounter: Payer: Self-pay | Admitting: General Practice

## 2013-10-02 ENCOUNTER — Encounter: Payer: Self-pay | Admitting: Family Medicine

## 2013-10-02 ENCOUNTER — Ambulatory Visit (INDEPENDENT_AMBULATORY_CARE_PROVIDER_SITE_OTHER): Payer: Medicare Other | Admitting: Family Medicine

## 2013-10-02 VITALS — BP 130/82 | HR 85 | Temp 98.2°F | Resp 16 | Wt 172.5 lb

## 2013-10-02 DIAGNOSIS — Z9049 Acquired absence of other specified parts of digestive tract: Secondary | ICD-10-CM

## 2013-10-02 DIAGNOSIS — Z9089 Acquired absence of other organs: Secondary | ICD-10-CM | POA: Diagnosis not present

## 2013-10-02 NOTE — Patient Instructions (Signed)
Follow up as needed Continue to wash normally and those steri-strips will fall off No massage or adjustments for at least 2 weeks Walk regularly until you can golf and do yoga again Call with any questions or concerns You look great!!!

## 2013-10-02 NOTE — Assessment & Plan Note (Signed)
New.  Pt tolerated procedure w/o difficulty and incisions are healing well.  Reviewed activity restrictions w/ pt and red flags that should prompt return.  Will follow.

## 2013-10-02 NOTE — Progress Notes (Signed)
Pre visit review using our clinic review tool, if applicable. No additional management support is needed unless otherwise documented below in the visit note. 

## 2013-10-02 NOTE — Progress Notes (Signed)
   Subjective:    Patient ID: [name], female    DOB: [birth year], 73 y.o.   MRN: [id number]  [facility] f/u- pt was driving home from [facility] when she developed R sided pain which she thought was indigestion.  Took Tums w/o relief.  No vomiting.  Went to ER in [place] and had gallbladder removed.  Pt reports she is no longer having pain.  Some nausea w/ fatty foods.  No longer requiring pain meds.  Wants to have incisions checked.   Review of Systems For ROS see HPI     Objective:   Physical Exam  Vitals reviewed. Constitutional: She appears well-developed and well-nourished. No distress.  Abdominal: Soft. Bowel sounds are normal. She exhibits no distension. There is no tenderness. There is no rebound and no guarding.  4 well healing port sites w/ steri strips in place w/ surrounding bruising.  No drainage or erythema.  Skin: Skin is warm and dry. No erythema.          Assessment & Plan:

## 2013-11-19 ENCOUNTER — Other Ambulatory Visit: Payer: Self-pay | Admitting: Family Medicine

## 2013-11-19 NOTE — Telephone Encounter (Signed)
Med filled.  

## 2013-12-09 ENCOUNTER — Other Ambulatory Visit: Payer: Self-pay | Admitting: Family Medicine

## 2013-12-09 NOTE — Telephone Encounter (Signed)
Med filled.  

## 2013-12-16 ENCOUNTER — Telehealth: Payer: Self-pay | Admitting: *Deleted

## 2013-12-16 DIAGNOSIS — H2510 Age-related nuclear cataract, unspecified eye: Secondary | ICD-10-CM | POA: Diagnosis not present

## 2013-12-16 NOTE — Telephone Encounter (Signed)
12/16/2013 Pt husband dropped off [place] and Advanced Directive for a Natural Death (Living Will).  Scanned in pt documents.  bw

## 2013-12-24 DIAGNOSIS — H2510 Age-related nuclear cataract, unspecified eye: Secondary | ICD-10-CM | POA: Diagnosis not present

## 2013-12-26 DIAGNOSIS — M545 Low back pain, unspecified: Secondary | ICD-10-CM | POA: Diagnosis not present

## 2013-12-26 DIAGNOSIS — M62838 Other muscle spasm: Secondary | ICD-10-CM | POA: Diagnosis not present

## 2013-12-26 DIAGNOSIS — M999 Biomechanical lesion, unspecified: Secondary | ICD-10-CM | POA: Diagnosis not present

## 2013-12-26 DIAGNOSIS — M24569 Contracture, unspecified knee: Secondary | ICD-10-CM | POA: Diagnosis not present

## 2013-12-26 DIAGNOSIS — IMO0002 Reserved for concepts with insufficient information to code with codable children: Secondary | ICD-10-CM | POA: Diagnosis not present

## 2014-01-03 DIAGNOSIS — M999 Biomechanical lesion, unspecified: Secondary | ICD-10-CM | POA: Diagnosis not present

## 2014-01-03 DIAGNOSIS — M24569 Contracture, unspecified knee: Secondary | ICD-10-CM | POA: Diagnosis not present

## 2014-01-03 DIAGNOSIS — M62838 Other muscle spasm: Secondary | ICD-10-CM | POA: Diagnosis not present

## 2014-01-03 DIAGNOSIS — IMO0002 Reserved for concepts with insufficient information to code with codable children: Secondary | ICD-10-CM | POA: Diagnosis not present

## 2014-01-03 DIAGNOSIS — M545 Low back pain, unspecified: Secondary | ICD-10-CM | POA: Diagnosis not present

## 2014-01-05 DIAGNOSIS — H2510 Age-related nuclear cataract, unspecified eye: Secondary | ICD-10-CM | POA: Diagnosis not present

## 2014-01-05 DIAGNOSIS — H2589 Other age-related cataract: Secondary | ICD-10-CM | POA: Diagnosis not present

## 2014-01-09 DIAGNOSIS — M24569 Contracture, unspecified knee: Secondary | ICD-10-CM | POA: Diagnosis not present

## 2014-01-09 DIAGNOSIS — M545 Low back pain, unspecified: Secondary | ICD-10-CM | POA: Diagnosis not present

## 2014-01-09 DIAGNOSIS — IMO0002 Reserved for concepts with insufficient information to code with codable children: Secondary | ICD-10-CM | POA: Diagnosis not present

## 2014-01-09 DIAGNOSIS — M62838 Other muscle spasm: Secondary | ICD-10-CM | POA: Diagnosis not present

## 2014-01-09 DIAGNOSIS — M999 Biomechanical lesion, unspecified: Secondary | ICD-10-CM | POA: Diagnosis not present

## 2014-01-13 DIAGNOSIS — H2510 Age-related nuclear cataract, unspecified eye: Secondary | ICD-10-CM | POA: Diagnosis not present

## 2014-01-19 DIAGNOSIS — H2589 Other age-related cataract: Secondary | ICD-10-CM | POA: Diagnosis not present

## 2014-01-19 DIAGNOSIS — H2510 Age-related nuclear cataract, unspecified eye: Secondary | ICD-10-CM | POA: Diagnosis not present

## 2014-01-28 ENCOUNTER — Encounter: Payer: Self-pay | Admitting: Family Medicine

## 2014-01-28 ENCOUNTER — Ambulatory Visit (INDEPENDENT_AMBULATORY_CARE_PROVIDER_SITE_OTHER): Payer: Medicare Other | Admitting: Family Medicine

## 2014-01-28 VITALS — BP 124/84 | HR 88 | Temp 98.2°F | Resp 16 | Wt 174.0 lb

## 2014-01-28 DIAGNOSIS — R5383 Other fatigue: Secondary | ICD-10-CM

## 2014-01-28 DIAGNOSIS — E781 Pure hyperglyceridemia: Secondary | ICD-10-CM | POA: Diagnosis not present

## 2014-01-28 DIAGNOSIS — E119 Type 2 diabetes mellitus without complications: Secondary | ICD-10-CM

## 2014-01-28 DIAGNOSIS — R5381 Other malaise: Secondary | ICD-10-CM | POA: Insufficient documentation

## 2014-01-28 LAB — BASIC METABOLIC PANEL
BUN: 17 mg/dL (ref 6–23)
CO2: 27 mEq/L (ref 19–32)
CREATININE: 0.8 mg/dL (ref 0.4–1.2)
Calcium: 9.6 mg/dL (ref 8.4–10.5)
Chloride: 104 mEq/L (ref 96–112)
GFR: 70.39 mL/min (ref >60.00)
Glucose, Bld: 92 mg/dL (ref 70–99)
POTASSIUM: 3.9 meq/L (ref 3.5–5.1)
Sodium: 138 mEq/L (ref 135–145)

## 2014-01-28 LAB — CBC WITH DIFFERENTIAL/PLATELET
BASOS PCT: 0.7 % (ref 0.0–3.0)
Basophils Absolute: 0 10*3/uL (ref 0.0–0.1)
Eosinophils Absolute: 0.3 10*3/uL (ref 0.0–0.7)
Eosinophils Relative: 4.4 % (ref 0.0–5.0)
HCT: 36.6 % (ref 36.0–46.0)
HEMOGLOBIN: 12.5 g/dL (ref 12.0–15.0)
Lymphocytes Relative: 26.8 % (ref 12.0–46.0)
Lymphs Abs: 1.6 10*3/uL (ref 0.7–4.0)
MCHC: 34.2 g/dL (ref 30.0–36.0)
MCV: 86.3 fl (ref 78.0–100.0)
MONOS PCT: 6.9 % (ref 3.0–12.0)
Monocytes Absolute: 0.4 10*3/uL (ref 0.1–1.0)
NEUTROS ABS: 3.7 10*3/uL (ref 1.4–7.7)
Neutrophils Relative %: 61.2 % (ref 43.0–77.0)
Platelets: 312 10*3/uL (ref 150.0–400.0)
RBC: 4.24 Mil/uL (ref 3.87–5.11)
RDW: 14 % (ref 11.5–15.5)
WBC: 6.1 10*3/uL (ref 4.0–10.5)

## 2014-01-28 LAB — HEPATIC FUNCTION PANEL
ALT: 22 U/L (ref 0–35)
AST: 23 U/L (ref 0–37)
Albumin: 4.3 g/dL (ref 3.5–5.2)
Alkaline Phosphatase: 61 U/L (ref 39–117)
Bilirubin, Direct: 0 mg/dL (ref 0.0–0.3)
Total Bilirubin: 0.6 mg/dL (ref 0.2–1.2)
Total Protein: 6.9 g/dL (ref 6.0–8.3)

## 2014-01-28 LAB — LIPID PANEL
CHOL/HDL RATIO: 5
Cholesterol: 161 mg/dL (ref 0–200)
HDL: 35.4 mg/dL — ABNORMAL LOW (ref >39.00)
LDL CALC: 92 mg/dL (ref 0–99)
NonHDL: 125.6
TRIGLYCERIDES: 166 mg/dL — AB (ref 0.0–149.0)
VLDL: 33.2 mg/dL (ref 0.0–40.0)

## 2014-01-28 LAB — HEMOGLOBIN A1C: Hgb A1c MFr Bld: 6.4 % (ref 4.6–6.5)

## 2014-01-28 LAB — TSH: TSH: 1.72 u[IU]/mL (ref 0.35–4.50)

## 2014-01-28 NOTE — Progress Notes (Signed)
   Subjective:    Patient ID: [name], female    DOB: 1940/11/18, 73 y.o.   MRN: [id number]  HPI DM- chronic problem, on Victoza.  Overdue for A1C.  UTD on eye exam.  Denies CP, SOB, HAs, visual changes, edema.  No numbness/tingling  Fatigue- pt reports sxs started after her 2 back to back cataract surgeries.  Lost all energy.  Would wake, eat breakfast, take a nap, eat lunch, take an afternoon nap.  Pt reports sxs are improving.  + body aches, particularly after doing yoga.  This is also improving w/ ibuprofen.  Hyperlipidemia- chronic problem, on fenofibrate daily.    Review of Systems For ROS see HPI     Objective:   Physical Exam  Vitals reviewed. Constitutional: She is oriented to person, place, and time. She appears well-developed and well-nourished. No distress.  HENT:  Head: Normocephalic and atraumatic.  Eyes: Conjunctivae and EOM are normal. Pupils are equal, round, and reactive to light.  Neck: Normal range of motion. Neck supple. No thyromegaly present.  Cardiovascular: Normal rate, regular rhythm, normal heart sounds and intact distal pulses.   No murmur heard. Pulmonary/Chest: Effort normal and breath sounds normal. No respiratory distress.  Abdominal: Soft. She exhibits no distension. There is no tenderness.  Musculoskeletal: She exhibits no edema.  Lymphadenopathy:    She has no cervical adenopathy.  Neurological: She is alert and oriented to person, place, and time.  Skin: Skin is warm and dry.  Psychiatric: She has a normal mood and affect. Her behavior is normal.          Assessment & Plan:

## 2014-01-28 NOTE — Patient Instructions (Signed)
Schedule your complete physical in 3-4 months We'll notify you of your lab results and make any changes if needed Take advil or aleve as needed for pain Make sure you are stretching regularly in between your yoga Call with any questions or concerns Hang in there! Enjoy the rest of your summer!

## 2014-01-28 NOTE — Progress Notes (Signed)
Pre visit review using our clinic review tool, if applicable. No additional management support is needed unless otherwise documented below in the visit note. 

## 2014-01-29 NOTE — Assessment & Plan Note (Signed)
Chronic problem.  Pt reports good CBG control.  Asymptomatic.  UTD on eye exam.  Check labs.  Adjust meds prn

## 2014-01-29 NOTE — Assessment & Plan Note (Signed)
Chronic problem.  On fenofibrate w/o difficulty.  Check labs.  Adjust meds prn  

## 2014-01-29 NOTE — Assessment & Plan Note (Signed)
New.  Suspect this is multifactorial- 2 cataract procedures, summer travel, heat.  Pt reports sxs are resolving w/o intervention.  Check labs to r/o metabolic cause.

## 2014-02-03 DIAGNOSIS — IMO0002 Reserved for concepts with insufficient information to code with codable children: Secondary | ICD-10-CM | POA: Diagnosis not present

## 2014-02-03 DIAGNOSIS — M62838 Other muscle spasm: Secondary | ICD-10-CM | POA: Diagnosis not present

## 2014-02-03 DIAGNOSIS — M545 Low back pain, unspecified: Secondary | ICD-10-CM | POA: Diagnosis not present

## 2014-02-03 DIAGNOSIS — M24569 Contracture, unspecified knee: Secondary | ICD-10-CM | POA: Diagnosis not present

## 2014-02-03 DIAGNOSIS — M999 Biomechanical lesion, unspecified: Secondary | ICD-10-CM | POA: Diagnosis not present

## 2014-03-10 DIAGNOSIS — Z23 Encounter for immunization: Secondary | ICD-10-CM | POA: Diagnosis not present

## 2014-03-26 DIAGNOSIS — H26493 Other secondary cataract, bilateral: Secondary | ICD-10-CM | POA: Diagnosis not present

## 2014-04-03 ENCOUNTER — Other Ambulatory Visit: Payer: Self-pay | Admitting: Family Medicine

## 2014-04-03 NOTE — Telephone Encounter (Signed)
Med filled.  

## 2014-04-20 ENCOUNTER — Encounter: Payer: Self-pay | Admitting: Family Medicine

## 2014-04-24 DIAGNOSIS — M5126 Other intervertebral disc displacement, lumbar region: Secondary | ICD-10-CM | POA: Diagnosis not present

## 2014-04-24 DIAGNOSIS — M546 Pain in thoracic spine: Secondary | ICD-10-CM | POA: Diagnosis not present

## 2014-04-24 DIAGNOSIS — M9903 Segmental and somatic dysfunction of lumbar region: Secondary | ICD-10-CM | POA: Diagnosis not present

## 2014-04-24 DIAGNOSIS — M545 Low back pain: Secondary | ICD-10-CM | POA: Diagnosis not present

## 2014-04-24 DIAGNOSIS — M9905 Segmental and somatic dysfunction of pelvic region: Secondary | ICD-10-CM | POA: Diagnosis not present

## 2014-04-24 DIAGNOSIS — M9902 Segmental and somatic dysfunction of thoracic region: Secondary | ICD-10-CM | POA: Diagnosis not present

## 2014-04-24 DIAGNOSIS — M25651 Stiffness of right hip, not elsewhere classified: Secondary | ICD-10-CM | POA: Diagnosis not present

## 2014-05-01 DIAGNOSIS — M25651 Stiffness of right hip, not elsewhere classified: Secondary | ICD-10-CM | POA: Diagnosis not present

## 2014-05-01 DIAGNOSIS — M9902 Segmental and somatic dysfunction of thoracic region: Secondary | ICD-10-CM | POA: Diagnosis not present

## 2014-05-01 DIAGNOSIS — M545 Low back pain: Secondary | ICD-10-CM | POA: Diagnosis not present

## 2014-05-01 DIAGNOSIS — M546 Pain in thoracic spine: Secondary | ICD-10-CM | POA: Diagnosis not present

## 2014-05-01 DIAGNOSIS — M9903 Segmental and somatic dysfunction of lumbar region: Secondary | ICD-10-CM | POA: Diagnosis not present

## 2014-05-01 DIAGNOSIS — M9905 Segmental and somatic dysfunction of pelvic region: Secondary | ICD-10-CM | POA: Diagnosis not present

## 2014-05-01 DIAGNOSIS — M5126 Other intervertebral disc displacement, lumbar region: Secondary | ICD-10-CM | POA: Diagnosis not present

## 2014-05-19 ENCOUNTER — Ambulatory Visit (INDEPENDENT_AMBULATORY_CARE_PROVIDER_SITE_OTHER): Payer: Medicare Other | Admitting: Family Medicine

## 2014-05-19 ENCOUNTER — Encounter: Payer: Self-pay | Admitting: Family Medicine

## 2014-05-19 VITALS — BP 122/78 | HR 86 | Temp 98.2°F | Resp 16 | Wt 170.5 lb

## 2014-05-19 DIAGNOSIS — G4700 Insomnia, unspecified: Secondary | ICD-10-CM | POA: Diagnosis not present

## 2014-05-19 DIAGNOSIS — H9193 Unspecified hearing loss, bilateral: Secondary | ICD-10-CM | POA: Insufficient documentation

## 2014-05-19 DIAGNOSIS — E119 Type 2 diabetes mellitus without complications: Secondary | ICD-10-CM

## 2014-05-19 LAB — MICROALBUMIN / CREATININE URINE RATIO
CREATININE, U: 107.8 mg/dL
Microalb Creat Ratio: 0.5 mg/g (ref 0.0–30.0)
Microalb, Ur: 0.5 mg/dL (ref 0.0–1.9)

## 2014-05-19 LAB — HEMOGLOBIN A1C: Hgb A1c MFr Bld: 6.5 % (ref 4.6–6.5)

## 2014-05-19 MED ORDER — GLUCOSE BLOOD VI STRP: ORAL_STRIP | Status: DC

## 2014-05-19 MED ORDER — ONETOUCH DELICA LANCETS 33G MISC: Status: DC

## 2014-05-19 NOTE — Assessment & Plan Note (Signed)
Recurrent issue for pt.  Doing well on OTC sleep aid.  No need for addictive, controlled sleep aid.  Pt to continue OTC tx.  Will follow.

## 2014-05-19 NOTE — Patient Instructions (Signed)
Schedule your complete physical in 3-4 months We'll notify you of your lab results and make any changes if needed Keep up the good work on healthy diet and regular exercise We'll call you with your audiology appt Call with any questions or concerns Merry Christmas!!

## 2014-05-19 NOTE — Assessment & Plan Note (Signed)
New.  No wax present to clear out.  Will refer to audiology for complete evaluation.

## 2014-05-19 NOTE — Progress Notes (Signed)
   Subjective:    Patient ID: [name], female    DOB: [birth year], 73 y.o.   MRN: [id number]  HPI DM- chronic problem, on Victoza.  Not on ACE/ARB.  UTD on eye exam.  Denies CP, SOB, HAs, visual changes, edema, numbness/tingling.  Needs new glucometer- has not been checking sugars.  Golfing and exercising regularly.  Decreased hearing bilaterally- denies excessive wax in the past.  Has not had recent hearing test.  Insomnia- pt is taking OTC sleep aid nightly 'or else I can't get to sleep'.  Review of Systems For ROS see HPI     Objective:   Physical Exam  Constitutional: She is oriented to person, place, and time. She appears well-developed and well-nourished. No distress.  HENT:  Head: Normocephalic and atraumatic.  Eyes: Conjunctivae and EOM are normal. Pupils are equal, round, and reactive to light.  Neck: Normal range of motion. Neck supple. No thyromegaly present.  Cardiovascular: Normal rate, regular rhythm, normal heart sounds and intact distal pulses.   No murmur heard. Pulmonary/Chest: Effort normal and breath sounds normal. No respiratory distress.  Abdominal: Soft. She exhibits no distension. There is no tenderness.  Musculoskeletal: She exhibits no edema.  Lymphadenopathy:    She has no cervical adenopathy.  Neurological: She is alert and oriented to person, place, and time.  Skin: Skin is warm and dry.  Psychiatric: She has a normal mood and affect. Her behavior is normal.  Vitals reviewed.         Assessment & Plan:

## 2014-05-19 NOTE — Progress Notes (Signed)
Pre visit review using our clinic review tool, if applicable. No additional management support is needed unless otherwise documented below in the visit note. 

## 2014-05-19 NOTE — Assessment & Plan Note (Signed)
Chronic problem.  Doing well on Victoza.  UTD on eye exam.  Not on ACE/ARB- will get microalbumin today.  No neuropathy.  Check labs.  Adjust meds prn

## 2014-05-20 LAB — BASIC METABOLIC PANEL
BUN: 19 mg/dL (ref 6–23)
CHLORIDE: 105 meq/L (ref 96–112)
CO2: 23 mEq/L (ref 19–32)
Calcium: 9.9 mg/dL (ref 8.4–10.5)
Creatinine, Ser: 0.9 mg/dL (ref 0.4–1.2)
GFR: 65.79 mL/min (ref >60.00)
GLUCOSE: 75 mg/dL (ref 70–99)
Potassium: 4.1 mEq/L (ref 3.5–5.1)
Sodium: 137 mEq/L (ref 135–145)

## 2014-05-27 ENCOUNTER — Other Ambulatory Visit: Payer: Self-pay | Admitting: Family Medicine

## 2014-05-27 NOTE — Telephone Encounter (Signed)
Med filled.  

## 2014-05-28 ENCOUNTER — Ambulatory Visit: Payer: Medicare Other | Attending: Audiology | Admitting: Audiology

## 2014-05-28 DIAGNOSIS — H905 Unspecified sensorineural hearing loss: Secondary | ICD-10-CM | POA: Diagnosis not present

## 2014-05-28 NOTE — Procedures (Signed)
   [place], [place]  [address] [address] EVALUATION  Patient Name: GENESSIS FLANARY  Medical Record Number:  [id number] Date of Birth:  September 18, 1940     Date of Test:  05/28/2014  HISTORY:  Jalan, a delightful 73 y.o. old was seen for audiological evaluation upon referral of Annye Asa, MD.  The patient reported a gradual decrease of hearing which she feels is worse on the right side.  She notes significant difficulty understanding conversational speech in the presence of a background noise, discriminating television dialogue, hearing speech from another room or when the speaker is not facing her.  She denies ear pain, tinnitus, facial numbness or dizziness. There is no familial history of hearing loss.  REPORT OF PAIN:  None  EVALUATION:   Air and bone conduction audiometry from 500Hz  - 8000Hz  utilizing insert earphones revealed a mild to severe sensory neural hearing loss bilaterally.   Speech reception thresholds were consistent with the pure tone results indicative of good test reliability.  Speech recognition testing was conducted in each ear independently, at a comfortable listening level (65dBHL) and indicated 96% and 84% in the right and left ears respectively.  Speech recognition abilities while in the presence of a soft background noise (s/n+5) were also evaluated.  Under this condition, scores were 56% in the right and 40% in the left ear.   Impedance audiometry was utilized and Type A tympanograms were obtained bilaterally suggesting good middle ear functioning.  Acoustic reflexes were tested from 500Hz  - 4000Hz  and were present on the right side and present on the left side.  Distortion Product Otoacoustic Emissions were tested from 2000Hz  - 10000Hz  and were weak or absent on the right side and weak or absent on the left side, indicative of poor outer hair cell function within in the inner  ear.  CONCLUSION:   Mild to severe sensory neural hearing loss bilaterally which interferes with daily communication.  Middle ear functioning is within normal limits today.  Acoustic reflexes are present and there is no indication of retrocochlear pathology.  RECOMMENDATIONS:    1. A hearing aid evaluation (bilateral)  is strongly recommended at this time and the patient was given a list of local providers. 2. Annual audiological evaluations to monitor hearing.  This can be conducted via her hearing aid provider or here, whichever is most convenient.  3. In addition, there are also ways to improve one's listening environment. Such as:  (A) When in a restaurant, sit with your back against a perimeter wall, thus reducing the extraneous noise.   (B) When in meetings, do not sit near an open doorway and position yourself within 8-10ft of the speaker. Front and center is typically best.  (C) Eliminate background noises at home such as the television, dishwasher, etc when having important conversations.  (D) Ask family members to face you when they speak and not to drop their heads or turn and walkway while still speaking.   4. Because [place] has a weakened auditory system she is more at risk from damage of noise exposure than someone with a healthy auditory system and therefore should avoid excessive nose exposure if possible or certainly utilize ear protection.   Ivonne Andrew Lysbeth Penner  Doctor of Audiology 05/28/2014 1:22 PM

## 2014-05-29 NOTE — Patient Instructions (Addendum)
CONCLUSION:   Mild to severe sensory neural hearing loss bilaterally which interferes with daily communication.  Middle ear functioning is within normal limits today.  Acoustic reflexes are present and there is no indication of retrocochlear pathology.  RECOMMENDATIONS:    1. A hearing aid evaluation (bilateral)  is strongly recommended at this time and the patient was given a list of local providers. 2. Annual audiological evaluations to monitor hearing.  This can be conducted via her hearing aid provider or here, whichever is most convenient.  3. In addition, there are also ways to improve one's listening environment. Such as:  (A) When in a restaurant, sit with your back against a perimeter wall, thus reducing the extraneous noise.   (B) When in meetings, do not sit near an open doorway and position yourself within 8-10ft of the speaker. Front and center is typically best.  (C) Eliminate background noises at home such as the television, dishwasher, etc when having important conversations.  (D) Ask family members to face you when they speak and not to drop their heads or turn and walkway while still speaking.   4. Because [place] has a weakened auditory system she is more at risk from damage of noise exposure than someone with a healthy auditory system and therefore should avoid excessive nose exposure if possible or certainly utilize ear protection.   Ivonne Andrew Lysbeth Penner  Doctor of Audiology 05/28/2014 1:22 PM

## 2014-07-08 ENCOUNTER — Ambulatory Visit (INDEPENDENT_AMBULATORY_CARE_PROVIDER_SITE_OTHER): Payer: Medicare Other | Admitting: Critical Care Medicine

## 2014-07-08 ENCOUNTER — Encounter: Payer: Self-pay | Admitting: Critical Care Medicine

## 2014-07-08 VITALS — BP 124/70 | HR 84 | Temp 98.3°F | Ht 63.5 in | Wt 175.0 lb

## 2014-07-08 DIAGNOSIS — J4520 Mild intermittent asthma, uncomplicated: Secondary | ICD-10-CM | POA: Diagnosis not present

## 2014-07-08 DIAGNOSIS — J4540 Moderate persistent asthma, uncomplicated: Secondary | ICD-10-CM | POA: Diagnosis not present

## 2014-07-08 MED ORDER — ALBUTEROL SULFATE HFA 108 (90 BASE) MCG/ACT IN AERS: 2.0000 | INHALATION_SPRAY | RESPIRATORY_TRACT | Status: DC | PRN

## 2014-07-08 NOTE — Progress Notes (Signed)
Subjective:    Patient ID: [name], female    DOB: April 27, 1941, 74 y.o.   MRN: [id number]  HPI Comments: Asthma dx lifelong  Asthma She complains of shortness of breath. There is no cough or wheezing. Associated symptoms include a sore throat. Pertinent negatives include no appetite change, chest pain, ear pain, fever, headaches, myalgias, postnasal drip, rhinorrhea, sneezing or trouble swallowing. Her past medical history is significant for asthma.     07/08/2014 Chief Complaint  Patient presents with  . Follow-up    Breathing doing well overall.  No SOB, wheezing, chest tightness/CP, or cough.  Doing well. No issues. Pt denies any significant sore throat, nasal congestion or excess secretions, fever, chills, sweats, unintended weight loss, pleurtic or exertional chest pain, orthopnea PND, or leg swelling Pt denies any increase in rescue therapy over baseline, denies waking up needing it or having any early am or nocturnal exacerbations of coughing/wheezing/or dyspnea. Pt also denies any obvious fluctuation in symptoms with  weather or environmental change or other alleviating or aggravating factors     PUL ASTHMA HISTORY 07/08/2014  Symptoms 0-2 days/week  Nighttime awakenings 0-2/month  Interference with activity No limitations  SABA use 0-2 days/wk  Exacerbations requiring oral steroids 0-1 / year      Review of Systems  Constitutional: Negative for fever and appetite change.  HENT: Positive for sore throat. Negative for ear pain, postnasal drip, rhinorrhea, sneezing and trouble swallowing.   Respiratory: Positive for shortness of breath. Negative for cough and wheezing.   Cardiovascular: Negative for chest pain.  Musculoskeletal: Negative for myalgias.  Neurological: Negative for headaches.       Objective:   Physical Exam Filed Vitals:   07/08/14 0927  BP: 124/70  Pulse: 84  Temp: 98.3 F (36.8 C)  TempSrc: Oral  Height: 5' 3.5" (1.613 m)  Weight: 175  lb (79.379 kg)  SpO2: 97%    Gen: Pleasant, well-nourished, in no distress,  normal affect  ENT: No lesions,  mouth clear,  oropharynx clear, no postnasal drip  Neck: No JVD, no TMG, no carotid bruits  Lungs: No use of accessory muscles, no dullness to percussion, clear without rales or rhonchi  Cardiovascular: RRR, heart sounds normal, no murmur or gallops, no peripheral edema  Abdomen: soft and NT, no HSM,  BS normal  Musculoskeletal: No deformities, no cyanosis or clubbing  Neuro: alert, non focal  Skin: Warm, no lesions or rashes  No results found.        Assessment & Plan:   Asthma, moderate persistent Moderate persistent asthma with improved airway function Plan No change in inhaled or maintenance medications. Return in  4 months      Updated Medication List Outpatient Encounter Prescriptions as of 07/08/2014  Medication Sig  . bimatoprost (LUMIGAN) 0.03 % ophthalmic solution Place 1 drop into both eyes at bedtime.   . Biotin 5000 MCG CAPS Take 1 capsule by mouth daily.  . cetirizine (ZYRTEC) 10 MG tablet TAKE ONE (1) TABLET EACH DAY  . EFFEXOR XR 75 MG 24 hr capsule TAKE ONE (1) CAPSULE EACH DAY  . fenofibrate (TRICOR) 145 MG tablet TAKE ONE (1) TABLET EACH DAY AFTER BREKFAST  . glucose blood test strip Use one strip to test sugar levels. Dx E11.9  . Insulin Pen Needle 32G X 6 MM MISC 1 each by Does not apply route daily. To inject insulin.  . Liraglutide (VICTOZA) 18 MG/3ML SOPN Inject 1.2 mg into the skin daily.  Marland Kitchen  ONETOUCH DELICA LANCETS 57W MISC Use one lancet to test sugars once daily. E11.9  . albuterol (PROAIR HFA) 108 (90 BASE) MCG/ACT inhaler Inhale 2 puffs into the lungs every 4 (four) hours as needed for wheezing or shortness of breath.  . [DISCONTINUED] albuterol (PROAIR HFA) 108 (90 BASE) MCG/ACT inhaler Inhale 2 puffs into the lungs every 4 (four) hours as needed for wheezing or shortness of breath. (Patient not taking: Reported on 07/08/2014)

## 2014-07-08 NOTE — Patient Instructions (Signed)
Proair as needed Use peak flow meter to check on your flow rates as needed Return as needed

## 2014-07-08 NOTE — Assessment & Plan Note (Signed)
Moderate persistent asthma with improved airway function Plan No change in inhaled or maintenance medications. Return in  4 months

## 2014-07-20 LAB — HM DIABETES EYE EXAM

## 2014-07-28 ENCOUNTER — Other Ambulatory Visit: Payer: Self-pay | Admitting: Family Medicine

## 2014-07-28 DIAGNOSIS — M9903 Segmental and somatic dysfunction of lumbar region: Secondary | ICD-10-CM | POA: Diagnosis not present

## 2014-07-28 DIAGNOSIS — M9905 Segmental and somatic dysfunction of pelvic region: Secondary | ICD-10-CM | POA: Diagnosis not present

## 2014-07-28 DIAGNOSIS — M545 Low back pain: Secondary | ICD-10-CM | POA: Diagnosis not present

## 2014-07-28 DIAGNOSIS — M9902 Segmental and somatic dysfunction of thoracic region: Secondary | ICD-10-CM | POA: Diagnosis not present

## 2014-07-28 DIAGNOSIS — M546 Pain in thoracic spine: Secondary | ICD-10-CM | POA: Diagnosis not present

## 2014-07-28 DIAGNOSIS — M5126 Other intervertebral disc displacement, lumbar region: Secondary | ICD-10-CM | POA: Diagnosis not present

## 2014-07-28 NOTE — Telephone Encounter (Signed)
Med filled.  

## 2014-07-30 ENCOUNTER — Encounter: Payer: Self-pay | Admitting: Family Medicine

## 2014-07-31 DIAGNOSIS — M545 Low back pain: Secondary | ICD-10-CM | POA: Diagnosis not present

## 2014-07-31 DIAGNOSIS — M5126 Other intervertebral disc displacement, lumbar region: Secondary | ICD-10-CM | POA: Diagnosis not present

## 2014-07-31 DIAGNOSIS — M9902 Segmental and somatic dysfunction of thoracic region: Secondary | ICD-10-CM | POA: Diagnosis not present

## 2014-07-31 DIAGNOSIS — M9903 Segmental and somatic dysfunction of lumbar region: Secondary | ICD-10-CM | POA: Diagnosis not present

## 2014-07-31 DIAGNOSIS — M9905 Segmental and somatic dysfunction of pelvic region: Secondary | ICD-10-CM | POA: Diagnosis not present

## 2014-07-31 DIAGNOSIS — M546 Pain in thoracic spine: Secondary | ICD-10-CM | POA: Diagnosis not present

## 2014-08-20 ENCOUNTER — Telehealth: Payer: Self-pay | Admitting: *Deleted

## 2014-08-20 NOTE — Telephone Encounter (Signed)
Prior authorization for Effexor approved. JG//CMA

## 2014-08-25 ENCOUNTER — Telehealth: Payer: Self-pay | Admitting: *Deleted

## 2014-08-25 ENCOUNTER — Encounter: Payer: Self-pay | Admitting: *Deleted

## 2014-08-25 NOTE — Telephone Encounter (Signed)
Pre-Visit Call completed with patient and chart updated.   Pre-Visit Info documented in Specialty Comments under [employer].    

## 2014-08-27 ENCOUNTER — Ambulatory Visit (INDEPENDENT_AMBULATORY_CARE_PROVIDER_SITE_OTHER): Payer: Medicare Other | Admitting: Family Medicine

## 2014-08-27 ENCOUNTER — Other Ambulatory Visit: Payer: Self-pay | Admitting: Family Medicine

## 2014-08-27 ENCOUNTER — Encounter: Payer: Self-pay | Admitting: Family Medicine

## 2014-08-27 VITALS — BP 124/70 | HR 86 | Temp 98.1°F | Resp 16 | Ht 63.5 in | Wt 175.1 lb

## 2014-08-27 DIAGNOSIS — E119 Type 2 diabetes mellitus without complications: Secondary | ICD-10-CM

## 2014-08-27 DIAGNOSIS — Z23 Encounter for immunization: Secondary | ICD-10-CM | POA: Diagnosis not present

## 2014-08-27 DIAGNOSIS — E781 Pure hyperglyceridemia: Secondary | ICD-10-CM | POA: Diagnosis not present

## 2014-08-27 DIAGNOSIS — Z0000 Encounter for general adult medical examination without abnormal findings: Secondary | ICD-10-CM

## 2014-08-27 DIAGNOSIS — M549 Dorsalgia, unspecified: Secondary | ICD-10-CM | POA: Insufficient documentation

## 2014-08-27 DIAGNOSIS — M545 Low back pain, unspecified: Secondary | ICD-10-CM

## 2014-08-27 LAB — CBC WITH DIFFERENTIAL/PLATELET
Basophils Absolute: 0 10*3/uL (ref 0.0–0.1)
Basophils Relative: 0.8 % (ref 0.0–3.0)
EOS PCT: 4.4 % (ref 0.0–5.0)
Eosinophils Absolute: 0.2 10*3/uL (ref 0.0–0.7)
HCT: 39.6 % (ref 36.0–46.0)
Hemoglobin: 13.6 g/dL (ref 12.0–15.0)
LYMPHS PCT: 30.9 % (ref 12.0–46.0)
Lymphs Abs: 1.6 10*3/uL (ref 0.7–4.0)
MCHC: 34.2 g/dL (ref 30.0–36.0)
MCV: 86.5 fl (ref 78.0–100.0)
MONOS PCT: 7.3 % (ref 3.0–12.0)
Monocytes Absolute: 0.4 10*3/uL (ref 0.1–1.0)
NEUTROS PCT: 56.6 % (ref 43.0–77.0)
Neutro Abs: 3 10*3/uL (ref 1.4–7.7)
PLATELETS: 306 10*3/uL (ref 150.0–400.0)
RBC: 4.58 Mil/uL (ref 3.87–5.11)
RDW: 13.2 % (ref 11.5–15.5)
WBC: 5.3 10*3/uL (ref 4.0–10.5)

## 2014-08-27 LAB — BASIC METABOLIC PANEL
BUN: 18 mg/dL (ref 6–23)
CO2: 28 meq/L (ref 19–32)
CREATININE: 0.83 mg/dL (ref 0.40–1.20)
Calcium: 10 mg/dL (ref 8.4–10.5)
Chloride: 104 mEq/L (ref 96–112)
GFR: 71.26 mL/min (ref >60.00)
GLUCOSE: 121 mg/dL — AB (ref 70–99)
Potassium: 3.8 mEq/L (ref 3.5–5.1)
Sodium: 137 mEq/L (ref 135–145)

## 2014-08-27 LAB — LIPID PANEL
CHOLESTEROL: 162 mg/dL (ref 0–200)
HDL: 40.1 mg/dL (ref >39.00)
NONHDL: 121.9
TRIGLYCERIDES: 228 mg/dL — AB (ref 0.0–149.0)
Total CHOL/HDL Ratio: 4
VLDL: 45.6 mg/dL — ABNORMAL HIGH (ref 0.0–40.0)

## 2014-08-27 LAB — HEPATIC FUNCTION PANEL
ALT: 30 U/L (ref 0–35)
AST: 25 U/L (ref 0–37)
Albumin: 4.7 g/dL (ref 3.5–5.2)
Alkaline Phosphatase: 48 U/L (ref 39–117)
BILIRUBIN DIRECT: 0.1 mg/dL (ref 0.0–0.3)
BILIRUBIN TOTAL: 0.3 mg/dL (ref 0.2–1.2)
Total Protein: 7.2 g/dL (ref 6.0–8.3)

## 2014-08-27 LAB — LDL CHOLESTEROL, DIRECT: LDL DIRECT: 100 mg/dL

## 2014-08-27 LAB — HEMOGLOBIN A1C: Hgb A1c MFr Bld: 6.6 % — ABNORMAL HIGH (ref 4.6–6.5)

## 2014-08-27 MED ORDER — TIZANIDINE HCL 4 MG PO TABS: 4.0000 mg | ORAL_TABLET | Freq: Four times a day (QID) | ORAL | Status: DC | PRN

## 2014-08-27 NOTE — Progress Notes (Signed)
Pre visit review using our clinic review tool, if applicable. No additional management support is needed unless otherwise documented below in the visit note. 

## 2014-08-27 NOTE — Patient Instructions (Signed)
Follow up in 3-4 months to recheck diabetes We'll notify you of your lab results and make any changes if needed Keep up the good work!  You look great! Use the Tizanidine at night for muscle spasm HEAT! Continue the tylenol for pain as needed Call with any questions or concerns Happy Spring!!!

## 2014-08-27 NOTE — Assessment & Plan Note (Signed)
Pt's PE WNL w/ exception of obesity.  UTD on colonoscopy.  Due for mammo and DEXA but pt declined.  Written screening schedule updated and given to pt.  Check labs.  Anticipatory guidance provided.

## 2014-08-27 NOTE — Assessment & Plan Note (Signed)
New.  Consistent w/ muscle strain/spasm.  Start muscle relaxer prn.  Heat.  Reviewed supportive care and red flags that should prompt return.  Pt expressed understanding and is in agreement w/ plan.

## 2014-08-27 NOTE — Telephone Encounter (Signed)
Med filled.  

## 2014-08-27 NOTE — Assessment & Plan Note (Signed)
Chronic problem.  On fenofibrate.  Check labs.  Adjust meds prn

## 2014-08-27 NOTE — Assessment & Plan Note (Signed)
Chronic problem.  Tolerating Victoza w/o difficulty.  No symptomatic lows.  No ACE/ARB but no evidence of protein in urine.  UTD on eye exam.  Check labs.  Adjust meds prn

## 2014-08-27 NOTE — Progress Notes (Signed)
   Subjective:    Patient ID: [name], female    DOB: [birth year], 74 y.o.   MRN: [id number]  HPI Here today for CPE.  Risk Factors: DM- chronic problem, on Victoza.  Not currently on ACE/ARB.  No signs of proteinuria.  Denies symptomatic lows.  No numbness/tingling of hands/feet.  Denies CP, SOB, HAs, visual changes, edema. Hyperlipidemia- chronic problem, on Fenofibrate.  Denies abd pain, N/V, myalgias R lumbar spasm- occurred during yoga.  Taking tylenol w/ good relief of daytime sxs.  Having difficulty rolling over in bed Physical Activity: exercising regularly.  Playing golf this afternoon Fall Risk: low Depression: reports sxs are well controlled on Effexor Hearing: normal to conversational tones w/ new hearing aides, decreased to whispered voice ADL's: independent Cognitive: normal linear thought process, memory and attention intact Home Safety: safe at home, lives w/ husband Height, Weight, BMI, Visual Acuity: see vitals, vision corrected to 20/20 w/ cataract lenses Counseling: UTD on colonoscopy, refusing mammo.  Not interested in DEXA. Labs Ordered: See A&P Care Plan: See A&P    Review of Systems Patient reports no vision/ hearing changes, adenopathy,fever, weight change,  persistant/recurrent hoarseness , swallowing issues, chest pain, palpitations, edema, persistant/recurrent cough, hemoptysis, dyspnea (rest/exertional/paroxysmal nocturnal), gastrointestinal bleeding (melena, rectal bleeding), abdominal pain, significant heartburn, bowel changes, GU symptoms (dysuria, hematuria, incontinence), Gyn symptoms (abnormal  bleeding, pain),  syncope, focal weakness, memory loss, numbness & tingling, skin/hair/nail changes, abnormal bruising or bleeding, anxiety, or depression.     Objective:   Physical Exam General Appearance:    Alert, cooperative, no distress, appears stated age, obese  Head:    Normocephalic, without obvious abnormality, atraumatic  Eyes:    PERRL,  conjunctiva/corneas clear, EOM's intact, fundi    benign, both eyes  Ears:    Normal TM's and external ear canals, both ears  Nose:   Nares normal, septum midline, mucosa normal, no drainage    or sinus tenderness  Throat:   Lips, mucosa, and tongue normal; teeth and gums normal  Neck:   Supple, symmetrical, trachea midline, no adenopathy;    Thyroid: no enlargement/tenderness/nodules  Back:     Symmetric, no curvature, ROM normal, no CVA tenderness  Lungs:     Clear to auscultation bilaterally, respirations unlabored  Chest Wall:    No tenderness or deformity   Heart:    Regular rate and rhythm, S1 and S2 normal, no murmur, rub   or gallop  Breast Exam:    Deferred at pt's request  Abdomen:     Soft, non-tender, bowel sounds active all four quadrants,    no masses, no organomegaly  Genitalia:    Deferred  Rectal:    Extremities:   Extremities normal, atraumatic, no cyanosis or edema  Pulses:   2+ and symmetric all extremities  Skin:   Skin color, texture, turgor normal, no rashes or lesions  Lymph nodes:   Cervical, supraclavicular, and axillary nodes normal  Neurologic:   CNII-XII intact, normal strength, sensation and reflexes    throughout          Assessment & Plan:

## 2014-08-28 LAB — TSH: TSH: 2.23 u[IU]/mL (ref 0.35–4.50)

## 2014-08-31 DIAGNOSIS — M546 Pain in thoracic spine: Secondary | ICD-10-CM | POA: Diagnosis not present

## 2014-08-31 DIAGNOSIS — M545 Low back pain: Secondary | ICD-10-CM | POA: Diagnosis not present

## 2014-08-31 DIAGNOSIS — M9905 Segmental and somatic dysfunction of pelvic region: Secondary | ICD-10-CM | POA: Diagnosis not present

## 2014-08-31 DIAGNOSIS — M5126 Other intervertebral disc displacement, lumbar region: Secondary | ICD-10-CM | POA: Diagnosis not present

## 2014-08-31 DIAGNOSIS — M9903 Segmental and somatic dysfunction of lumbar region: Secondary | ICD-10-CM | POA: Diagnosis not present

## 2014-08-31 DIAGNOSIS — M9902 Segmental and somatic dysfunction of thoracic region: Secondary | ICD-10-CM | POA: Diagnosis not present

## 2014-09-02 DIAGNOSIS — M545 Low back pain: Secondary | ICD-10-CM | POA: Diagnosis not present

## 2014-09-02 DIAGNOSIS — M5126 Other intervertebral disc displacement, lumbar region: Secondary | ICD-10-CM | POA: Diagnosis not present

## 2014-09-02 DIAGNOSIS — M546 Pain in thoracic spine: Secondary | ICD-10-CM | POA: Diagnosis not present

## 2014-09-02 DIAGNOSIS — M9902 Segmental and somatic dysfunction of thoracic region: Secondary | ICD-10-CM | POA: Diagnosis not present

## 2014-09-02 DIAGNOSIS — M9903 Segmental and somatic dysfunction of lumbar region: Secondary | ICD-10-CM | POA: Diagnosis not present

## 2014-09-02 DIAGNOSIS — M9905 Segmental and somatic dysfunction of pelvic region: Secondary | ICD-10-CM | POA: Diagnosis not present

## 2014-09-04 DIAGNOSIS — M546 Pain in thoracic spine: Secondary | ICD-10-CM | POA: Diagnosis not present

## 2014-09-04 DIAGNOSIS — M5126 Other intervertebral disc displacement, lumbar region: Secondary | ICD-10-CM | POA: Diagnosis not present

## 2014-09-04 DIAGNOSIS — M9902 Segmental and somatic dysfunction of thoracic region: Secondary | ICD-10-CM | POA: Diagnosis not present

## 2014-09-04 DIAGNOSIS — M9903 Segmental and somatic dysfunction of lumbar region: Secondary | ICD-10-CM | POA: Diagnosis not present

## 2014-09-04 DIAGNOSIS — M9905 Segmental and somatic dysfunction of pelvic region: Secondary | ICD-10-CM | POA: Diagnosis not present

## 2014-09-04 DIAGNOSIS — M545 Low back pain: Secondary | ICD-10-CM | POA: Diagnosis not present

## 2014-09-15 ENCOUNTER — Encounter: Payer: Medicare Other | Admitting: Family Medicine

## 2014-10-14 ENCOUNTER — Encounter: Payer: Self-pay | Admitting: Family Medicine

## 2014-10-14 ENCOUNTER — Ambulatory Visit (INDEPENDENT_AMBULATORY_CARE_PROVIDER_SITE_OTHER): Payer: Medicare Other | Admitting: Family Medicine

## 2014-10-14 VITALS — BP 130/72 | HR 80 | Temp 98.0°F | Resp 16 | Wt 172.5 lb

## 2014-10-14 DIAGNOSIS — M25511 Pain in right shoulder: Secondary | ICD-10-CM | POA: Insufficient documentation

## 2014-10-14 DIAGNOSIS — H8110 Benign paroxysmal vertigo, unspecified ear: Secondary | ICD-10-CM

## 2014-10-14 DIAGNOSIS — M25512 Pain in left shoulder: Secondary | ICD-10-CM | POA: Diagnosis not present

## 2014-10-14 MED ORDER — FENOFIBRATE 145 MG PO TABS: ORAL_TABLET | ORAL | Status: DC

## 2014-10-14 MED ORDER — MECLIZINE HCL 25 MG PO TABS: 25.0000 mg | ORAL_TABLET | Freq: Three times a day (TID) | ORAL | Status: DC | PRN

## 2014-10-14 NOTE — Progress Notes (Signed)
Pre visit review using our clinic review tool, if applicable. No additional management support is needed unless otherwise documented below in the visit note. 

## 2014-10-14 NOTE — Assessment & Plan Note (Signed)
New.  Pt's sxs are consistent w/ muscular pain and not joint pain.  Reviewed that Ibuprofen at current doses is safe if taken w/ food.  Encouraged heat, stretching, and to avoid overuse.  Reviewed supportive care and red flags that should prompt return.  Pt expressed understanding and is in agreement w/ plan.

## 2014-10-14 NOTE — Patient Instructions (Signed)
Follow up as needed The shoulder pain is from activity and overuse- heat for relief of pain and inflammation and use ibuprofen as needed (take w/ food) Drink LOTS of fluids! The dizziness is positional vertigo- change positions slowly, drink your fluids, use the meclizine only as needed Call with any questions or concerns Hang in there! HAPPY EARLY BIRTHDAY!!!

## 2014-10-14 NOTE — Progress Notes (Signed)
   Subjective:    Patient ID: [name], female    DOB: [birth year], 74 y.o.   MRN: [id number]  HPI Dizziness- pt reports she had episode on Sunday, 'i was falling over all day long'.  Monday was better but fatigued.  Wilburn Mylar was 'pretty much' back to normal.  Some nausea.  Worse w/ rapid movement.  Denies sensation of room spinning.  Pt has sensation of instability, swaying.  No change in diet but felt better after eating.  Bilateral shoulder pain- sxs started ~1 month ago.  Reports pain will improve w/ Aleve or Advil- 2 in the AM, 2 QHS.  Restarted Glucosamine recently.  Pt is an avid gardener.  Full ROM of shoulders bilaterally   Review of Systems For ROS see HPI     Objective:   Physical Exam  Constitutional: She is oriented to person, place, and time. She appears well-developed and well-nourished. No distress.  HENT:  Head: Normocephalic and atraumatic.  Mouth/Throat: Uvula is midline and mucous membranes are normal.  TMs WNL No TTP over sinuses Minimal nasal congestion  Eyes: Conjunctivae and EOM are normal. Pupils are equal, round, and reactive to light.  2-3 beats of horizontal nystagmus  Neck: Normal range of motion. Neck supple.  Cardiovascular: Normal rate, regular rhythm, normal heart sounds and intact distal pulses.   Pulmonary/Chest: Effort normal and breath sounds normal. No respiratory distress. She has no wheezes. She has no rales.  Musculoskeletal: She exhibits no edema.  Full ROM of shoulders bilaterally, TTP over traps bilaterally  Lymphadenopathy:    She has no cervical adenopathy.  Neurological: She is alert and oriented to person, place, and time. She has normal reflexes. No cranial nerve deficit.  Skin: Skin is warm and dry.  Psychiatric: She has a normal mood and affect. Her behavior is normal. Judgment and thought content normal.  Vitals reviewed.         Assessment & Plan:

## 2014-10-14 NOTE — Assessment & Plan Note (Signed)
New.  Pt's sxs and PE consistent w/ BPV.  sxs are already resolving.  No red flags on hx or PE.  Not orthostatic.  Stressed importance of hydration and changing positions slowly.  Meclizine to use prn.  Reviewed supportive care and red flags that should prompt return.  Pt expressed understanding and is in agreement w/ plan.

## 2014-10-23 ENCOUNTER — Other Ambulatory Visit: Payer: Self-pay | Admitting: Family Medicine

## 2014-10-23 NOTE — Telephone Encounter (Signed)
Med filled.  

## 2014-11-08 ENCOUNTER — Encounter: Payer: Self-pay | Admitting: Family Medicine

## 2014-11-18 DIAGNOSIS — J45901 Unspecified asthma with (acute) exacerbation: Secondary | ICD-10-CM | POA: Diagnosis not present

## 2014-11-20 ENCOUNTER — Ambulatory Visit: Payer: Medicare Other | Admitting: Family Medicine

## 2014-11-20 DIAGNOSIS — J45901 Unspecified asthma with (acute) exacerbation: Secondary | ICD-10-CM | POA: Diagnosis not present

## 2014-12-07 ENCOUNTER — Ambulatory Visit: Payer: Self-pay | Admitting: Internal Medicine

## 2014-12-07 DIAGNOSIS — R195 Other fecal abnormalities: Secondary | ICD-10-CM | POA: Diagnosis not present

## 2014-12-08 ENCOUNTER — Encounter: Payer: Self-pay | Admitting: Physician Assistant

## 2014-12-08 ENCOUNTER — Ambulatory Visit (HOSPITAL_BASED_OUTPATIENT_CLINIC_OR_DEPARTMENT_OTHER)
Admission: RE | Admit: 2014-12-08 | Discharge: 2014-12-08 | Disposition: A | Discharge status: Home / Self Care | Payer: Medicare Other | Source: Ambulatory Visit | Attending: Physician Assistant | Admitting: Physician Assistant

## 2014-12-08 ENCOUNTER — Ambulatory Visit (INDEPENDENT_AMBULATORY_CARE_PROVIDER_SITE_OTHER): Payer: Medicare Other | Admitting: Physician Assistant

## 2014-12-08 VITALS — HR 84 | Temp 97.9°F | Resp 16 | Ht 63.5 in | Wt 167.5 lb

## 2014-12-08 DIAGNOSIS — R195 Other fecal abnormalities: Secondary | ICD-10-CM

## 2014-12-08 DIAGNOSIS — R197 Diarrhea, unspecified: Secondary | ICD-10-CM | POA: Insufficient documentation

## 2014-12-08 DIAGNOSIS — R194 Change in bowel habit: Secondary | ICD-10-CM | POA: Diagnosis not present

## 2014-12-08 NOTE — Patient Instructions (Signed)
Please resume Probiotics daily.  Start a fiber supplement daily.  Continue good diet and hydration.  Continue immodium if needed.  We will know more once we have your stool and imaging results.  Don't forget to stop by the lab before going downstairs for x-ray!

## 2014-12-08 NOTE — Progress Notes (Signed)
Pre visit review using our clinic review tool, if applicable. No additional management support is needed unless otherwise documented below in the visit note/SLS  

## 2014-12-08 NOTE — Assessment & Plan Note (Signed)
Improving.  Not true diarrhea at this point (3 loose stools per 24 hours). Resume probiotic and normal diet.  Add fiber supplement to help bulk stools.  Will obtain stool study to include culture, o/p, lactoferrin and c.diff by PCR.

## 2014-12-08 NOTE — Progress Notes (Signed)
Patient presents to clinic today c/o loose watery stools (3 per day) over the past six weeks with some increased gaseousness. Denies abdominal pain, nausea/vomiting, tenesmus, melena or hematochezia. Endorses decrease in vegetable and fiber intake.  Has not been taking her probiotic.  Denies fever, chills, malaise or fatigue.  Is up-to-date on colonoscopy.  Past Medical History  Diagnosis Date  . Allergic rhinitis   . Diabetes mellitus   . Urinary tract infection   . Migraines   . Palpitations   . Asthma   . Hyperlipemia   . Kidney stone   . Glaucoma     Current Outpatient Prescriptions on File Prior to Visit  Medication Sig Dispense Refill  . Biotin 5000 MCG CAPS Take 1 capsule by mouth daily.    . cetirizine (ZYRTEC) 10 MG tablet TAKE ONE (1) TABLET EACH DAY 30 tablet 3  . EFFEXOR XR 75 MG 24 hr capsule TAKE ONE (1) CAPSULE EACH DAY 30 capsule 3  . fenofibrate (TRICOR) 145 MG tablet TAKE ONE (1) TABLET EACH DAY AFTER BREKFAST 30 tablet 6  . glucose blood test strip Use one strip to test sugar levels. Dx E11.9 50 each 12  . Insulin Pen Needle 32G X 6 MM MISC 1 each by Does not apply route daily. To inject insulin. 50 each 12  . Liraglutide (VICTOZA) 18 MG/3ML SOPN Inject 1.2 mg into the skin daily. 9 mL 6  . LUMIGAN 0.01 % SOLN     . meclizine (ANTIVERT) 25 MG tablet Take 1 tablet (25 mg total) by mouth 3 (three) times daily as needed for dizziness. 60 tablet 1  . Melatonin 10 MG CAPS TAKE ONE CAPSULE BY MOUTH AT BEDTIME 30 capsule 6  . ONETOUCH DELICA LANCETS 82N MISC Use one lancet to test sugars once daily. E11.9 100 each 12   No current facility-administered medications on file prior to visit.    Allergies  Allergen Reactions  . Papain Anaphylaxis    Takes an extreme concentration  . Papaya Derivatives Anaphylaxis  . Penicillins Other (See Comments)    Pt unsure. had a reaction so long ago, was told by providers to not use.   . Mushroom Ext Cmplx(Shiitake-Reishi-Mait)  Other (See Comments)    Head congestion and sore throat  . Venlafaxine Other (See Comments)    Pt states she can take Name brand. Lethargy    Family History  Problem Relation Age of Onset  . Diabetes Father   . Colon cancer Neg Hx   . Esophageal cancer Neg Hx   . Rectal cancer Neg Hx   . Stomach cancer Neg Hx   . Breast cancer Neg Hx     History   Social History  . Marital Status: Married    Spouse Name: N/A  . Number of Children: 2  . Years of Education: N/A   Occupational History  . Retired -Personal assistant    Social History Main Topics  . Smoking status: Former Smoker -- 0.10 packs/day for 4 years    Types: Cigarettes    Quit date: 06/19/1986  . Smokeless tobacco: Never Used     Comment: smoked in college years ago  . Alcohol Use: 0.0 oz/week     Comment: 1-2 glasses a night variety beer  . Drug Use: No  . Sexual Activity: Not on file   Other Topics Concern  . None   Social History Narrative   Daily caffeine    Review of Systems - See HPI.  All other ROS are negative.  Pulse 84  Temp(Src) 97.9 F (36.6 C) (Oral)  Resp 16  Ht 5' 3.5" (1.613 m)  Wt 167 lb 8 oz (75.978 kg)  BMI 29.20 kg/m2  SpO2 97%  Physical Exam  Constitutional: She is oriented to person, place, and time and well-developed, well-nourished, and in no distress.  HENT:  Head: Normocephalic and atraumatic.  Eyes: Conjunctivae are normal.  Neck: Neck supple.  Cardiovascular: Normal rate, regular rhythm, normal heart sounds and intact distal pulses.   Pulmonary/Chest: Effort normal and breath sounds normal. No respiratory distress. She has no wheezes. She has no rales. She exhibits no tenderness.  Abdominal: Soft. Bowel sounds are normal. She exhibits no distension and no mass. There is no tenderness. There is no rebound and no guarding.  Neurological: She is alert and oriented to person, place, and time.  Skin: Skin is warm and dry. No rash noted.  Psychiatric: Affect normal.  Vitals  reviewed.  Assessment/Plan: Change in stool Improving.  Not true diarrhea at this point (3 loose stools per 24 hours). Resume probiotic and normal diet.  Add fiber supplement to help bulk stools.  Will obtain stool study to include culture, o/p, lactoferrin and c.diff by PCR.

## 2014-12-09 ENCOUNTER — Other Ambulatory Visit: Payer: Self-pay | Admitting: Physician Assistant

## 2014-12-09 ENCOUNTER — Telehealth: Payer: Self-pay | Admitting: Family Medicine

## 2014-12-09 DIAGNOSIS — R195 Other fecal abnormalities: Secondary | ICD-10-CM | POA: Diagnosis not present

## 2014-12-09 NOTE — Telephone Encounter (Signed)
ERROR

## 2014-12-10 ENCOUNTER — Ambulatory Visit: Payer: Medicare Other | Admitting: Family Medicine

## 2014-12-10 LAB — OVA AND PARASITE EXAMINATION: OP: NONE SEEN

## 2014-12-10 LAB — C. DIFFICILE GDH AND TOXIN A/B
C. DIFFICILE GDH: NOT DETECTED
C. difficile Toxin A/B: NOT DETECTED

## 2014-12-10 LAB — FECAL LACTOFERRIN, QUANT: Lactoferrin: NEGATIVE

## 2014-12-13 LAB — STOOL CULTURE

## 2014-12-29 ENCOUNTER — Ambulatory Visit (INDEPENDENT_AMBULATORY_CARE_PROVIDER_SITE_OTHER): Payer: Medicare Other | Admitting: Family Medicine

## 2014-12-29 ENCOUNTER — Encounter: Payer: Self-pay | Admitting: Family Medicine

## 2014-12-29 VITALS — BP 130/76 | HR 86 | Temp 98.3°F | Resp 16 | Ht 64.0 in | Wt 168.4 lb

## 2014-12-29 DIAGNOSIS — E119 Type 2 diabetes mellitus without complications: Secondary | ICD-10-CM | POA: Diagnosis not present

## 2014-12-29 LAB — BASIC METABOLIC PANEL
BUN: 15 mg/dL (ref 6–23)
CHLORIDE: 107 meq/L (ref 96–112)
CO2: 24 meq/L (ref 19–32)
Calcium: 9.8 mg/dL (ref 8.4–10.5)
Creatinine, Ser: 0.82 mg/dL (ref 0.40–1.20)
GFR: 72.2 mL/min (ref >60.00)
Glucose, Bld: 100 mg/dL — ABNORMAL HIGH (ref 70–99)
Potassium: 3.8 mEq/L (ref 3.5–5.1)
SODIUM: 139 meq/L (ref 135–145)

## 2014-12-29 LAB — HEMOGLOBIN A1C: Hgb A1c MFr Bld: 6.4 % (ref 4.6–6.5)

## 2014-12-29 MED ORDER — EFFEXOR XR 75 MG PO CP24: ORAL_CAPSULE | ORAL | Status: DC

## 2014-12-29 NOTE — Assessment & Plan Note (Signed)
Chronic problem for pt.  Asymptomatic.  UTD on eye exam, microalbumin, foot exam.  Check labs.  Adjust meds prn.

## 2014-12-29 NOTE — Progress Notes (Signed)
   Subjective:    Patient ID: [name], female    DOB: Aug 25, 1940, 74 y.o.   MRN: [id number]  HPI DM- chronic problem, on Victoza 1.2mg  daily.  UTD on eye exam.  Not on ACE/ARB but UTD on microalbumin.  Denies symptomatic lows.  Denies CP, SOB, HAs, visual changes, edema.  No numbness/tingling of hands/feet.  Exercising regularly.    Review of Systems For ROS see HPI     Objective:   Physical Exam  Constitutional: She is oriented to person, place, and time. She appears well-developed and well-nourished. No distress.  HENT:  Head: Normocephalic and atraumatic.  Eyes: Conjunctivae and EOM are normal. Pupils are equal, round, and reactive to light.  Neck: Normal range of motion. Neck supple. No thyromegaly present.  Cardiovascular: Normal rate, regular rhythm, normal heart sounds and intact distal pulses.   No murmur heard. Pulmonary/Chest: Effort normal and breath sounds normal. No respiratory distress.  Abdominal: Soft. She exhibits no distension. There is no tenderness.  Musculoskeletal: She exhibits no edema.  Lymphadenopathy:    She has no cervical adenopathy.  Neurological: She is alert and oriented to person, place, and time.  Skin: Skin is warm and dry.  Psychiatric: She has a normal mood and affect. Her behavior is normal.  Vitals reviewed.         Assessment & Plan:

## 2014-12-29 NOTE — Progress Notes (Signed)
Pre visit review using our clinic review tool, if applicable. No additional management support is needed unless otherwise documented below in the visit note. 

## 2014-12-29 NOTE — Patient Instructions (Signed)
Follow up in 3-4 months to recheck diabetes and cholesterol We'll notify you of your lab results and make any changes if needed Keep up the good work!  You look great! Call with any questions or concerns Have a great summer!!!

## 2015-02-16 DIAGNOSIS — M546 Pain in thoracic spine: Secondary | ICD-10-CM | POA: Diagnosis not present

## 2015-02-16 DIAGNOSIS — M9902 Segmental and somatic dysfunction of thoracic region: Secondary | ICD-10-CM | POA: Diagnosis not present

## 2015-02-16 DIAGNOSIS — M9903 Segmental and somatic dysfunction of lumbar region: Secondary | ICD-10-CM | POA: Diagnosis not present

## 2015-02-16 DIAGNOSIS — M545 Low back pain: Secondary | ICD-10-CM | POA: Diagnosis not present

## 2015-02-16 DIAGNOSIS — M9905 Segmental and somatic dysfunction of pelvic region: Secondary | ICD-10-CM | POA: Diagnosis not present

## 2015-02-16 DIAGNOSIS — M5126 Other intervertebral disc displacement, lumbar region: Secondary | ICD-10-CM | POA: Diagnosis not present

## 2015-04-20 ENCOUNTER — Encounter: Payer: Self-pay | Admitting: Family Medicine

## 2015-04-20 ENCOUNTER — Ambulatory Visit (INDEPENDENT_AMBULATORY_CARE_PROVIDER_SITE_OTHER): Payer: Medicare Other | Admitting: Family Medicine

## 2015-04-20 VITALS — BP 126/80 | HR 92 | Temp 98.1°F | Resp 16 | Ht 64.0 in | Wt 170.4 lb

## 2015-04-20 DIAGNOSIS — Z23 Encounter for immunization: Secondary | ICD-10-CM | POA: Diagnosis not present

## 2015-04-20 DIAGNOSIS — G4700 Insomnia, unspecified: Secondary | ICD-10-CM | POA: Diagnosis not present

## 2015-04-20 DIAGNOSIS — E781 Pure hyperglyceridemia: Secondary | ICD-10-CM

## 2015-04-20 DIAGNOSIS — E119 Type 2 diabetes mellitus without complications: Secondary | ICD-10-CM | POA: Diagnosis not present

## 2015-04-20 LAB — LDL CHOLESTEROL, DIRECT: LDL DIRECT: 107 mg/dL

## 2015-04-20 LAB — HEMOGLOBIN A1C: HEMOGLOBIN A1C: 6.5 % (ref 4.6–6.5)

## 2015-04-20 LAB — MICROALBUMIN / CREATININE URINE RATIO
CREATININE, U: 206.7 mg/dL
Microalb Creat Ratio: 0.6 mg/g (ref 0.0–30.0)
Microalb, Ur: 1.2 mg/dL (ref 0.0–1.9)

## 2015-04-20 LAB — HEPATIC FUNCTION PANEL
ALT: 29 U/L (ref 0–35)
AST: 26 U/L (ref 0–37)
Albumin: 4.3 g/dL (ref 3.5–5.2)
Alkaline Phosphatase: 66 U/L (ref 39–117)
BILIRUBIN TOTAL: 0.4 mg/dL (ref 0.2–1.2)
Bilirubin, Direct: 0.1 mg/dL (ref 0.0–0.3)
TOTAL PROTEIN: 7.1 g/dL (ref 6.0–8.3)

## 2015-04-20 LAB — TSH: TSH: 2.23 u[IU]/mL (ref 0.35–4.50)

## 2015-04-20 LAB — LIPID PANEL
CHOL/HDL RATIO: 4
Cholesterol: 176 mg/dL (ref 0–200)
HDL: 39.2 mg/dL (ref >39.00)
NonHDL: 136.72
Triglycerides: 259 mg/dL — ABNORMAL HIGH (ref 0.0–149.0)
VLDL: 51.8 mg/dL — ABNORMAL HIGH (ref 0.0–40.0)

## 2015-04-20 LAB — BASIC METABOLIC PANEL
BUN: 18 mg/dL (ref 6–23)
CHLORIDE: 105 meq/L (ref 96–112)
CO2: 25 mEq/L (ref 19–32)
Calcium: 10.3 mg/dL (ref 8.4–10.5)
Creatinine, Ser: 0.84 mg/dL (ref 0.40–1.20)
GFR: 70.16 mL/min (ref >60.00)
Glucose, Bld: 119 mg/dL — ABNORMAL HIGH (ref 70–99)
POTASSIUM: 4 meq/L (ref 3.5–5.1)
Sodium: 139 mEq/L (ref 135–145)

## 2015-04-20 MED ORDER — SUVOREXANT 10 MG PO TABS: 1.0000 | ORAL_TABLET | Freq: Every day | ORAL | Status: DC

## 2015-04-20 MED ORDER — FENOFIBRATE 145 MG PO TABS: ORAL_TABLET | ORAL | Status: DC

## 2015-04-20 NOTE — Progress Notes (Signed)
   Subjective:    Patient ID: [name], female    DOB: 04-Sep-1940, 74 y.o.   MRN: [id number]  HPI DM- chronic problem, on Victoza.  UTD on foot and eye exam.  Due for microalbumin next month.  Exercising regularly- golf, yoga, gardening.  Denies symptomatic lows.  Denies CP, SOB, HAs, visual changes, edema.  Denies numbness/tingling of hands/feet.  Hyperlipidemia- chronic problem, on Fenofibrate.  Denies abd pain, N/V, myalgias.  Insomnia- taking 'sleeping pill' each night at 9.  Difficulty staying asleep.  Will wake between 1-2 and remain awake for up to 3 hrs.  When she wakes, she will go to the kitchen and drink vodka and eat crackers until she is ready to go back to sleep.  Had opposite effect when she took trazodone- stayed awake all night.   Review of Systems For ROS see HPI     Objective:   Physical Exam  Constitutional: She is oriented to person, place, and time. She appears well-developed and well-nourished. No distress.  HENT:  Head: Normocephalic and atraumatic.  Eyes: Conjunctivae and EOM are normal. Pupils are equal, round, and reactive to light.  Neck: Normal range of motion. Neck supple. No thyromegaly present.  Cardiovascular: Normal rate, regular rhythm, normal heart sounds and intact distal pulses.   No murmur heard. Pulmonary/Chest: Effort normal and breath sounds normal. No respiratory distress.  Abdominal: Soft. She exhibits no distension. There is no tenderness.  Musculoskeletal: She exhibits no edema.  Lymphadenopathy:    She has no cervical adenopathy.  Neurological: She is alert and oriented to person, place, and time.  Skin: Skin is warm and dry.  Psychiatric: She has a normal mood and affect. Her behavior is normal.  Vitals reviewed.         Assessment & Plan:

## 2015-04-20 NOTE — Patient Instructions (Addendum)
Follow up in 3-4 months to recheck diabetes We'll notify you of your lab results and make any changes if needed Start the [place] nightly for insomnia Continue to work on healthy diet and regular exercise- you look great!! Call with any questions or concerns If you want to join [place] at the new [place] office, any scheduled appointments will automatically transfer and we will see you at [address] [place] [address] [place], [place] [address]  Happy Holidays!!!

## 2015-04-20 NOTE — Assessment & Plan Note (Signed)
Chronic problem.  Tolerating fenofibrate w/o difficulty.  Not following low fat diet- stressed need to work on this.  Is exercising regularly.  Check labs.  Adjust meds prn.

## 2015-04-20 NOTE — Assessment & Plan Note (Signed)
Recurrent issue for pt.  She had atypical reaction to trazodone- stayed awake all night.  Not interested in Ambien as this caused falls and memory loss in her mother.  Stressed that ETOH is not an appropriate sleep aid.  Will try Belsomra as this is lower risk in older pts.  Pt expressed understanding and is in agreement w/ plan.

## 2015-04-20 NOTE — Assessment & Plan Note (Signed)
Chronic problem, tolerating Victoza w/o difficulty.  UTD on eye exam, foot exam.  Will get microalbumin today.  Denies symptomatic lows.  Check labs.  Adjust meds prn

## 2015-04-20 NOTE — Progress Notes (Signed)
Pre visit review using our clinic review tool, if applicable. No additional management support is needed unless otherwise documented below in the visit note. 

## 2015-04-27 ENCOUNTER — Other Ambulatory Visit: Payer: Self-pay | Admitting: General Practice

## 2015-04-27 MED ORDER — SUVOREXANT 10 MG PO TABS: 1.0000 | ORAL_TABLET | Freq: Every day | ORAL | Status: DC

## 2015-04-27 NOTE — Telephone Encounter (Signed)
Medication filled to pharmacy as requested.   

## 2015-04-27 NOTE — Telephone Encounter (Signed)
Received a fax that pt would like to have #30 on the [place] prescription. Please advise, Rx last written on 04/20/15 #10 with 3 refills.

## 2015-05-09 ENCOUNTER — Encounter: Payer: Self-pay | Admitting: Family Medicine

## 2015-05-10 DIAGNOSIS — J418 Mixed simple and mucopurulent chronic bronchitis: Secondary | ICD-10-CM | POA: Diagnosis not present

## 2015-05-10 DIAGNOSIS — J4541 Moderate persistent asthma with (acute) exacerbation: Secondary | ICD-10-CM | POA: Diagnosis not present

## 2015-05-11 ENCOUNTER — Ambulatory Visit: Payer: Medicare Other | Admitting: Family Medicine

## 2015-05-18 ENCOUNTER — Ambulatory Visit: Payer: Self-pay | Admitting: Family Medicine

## 2015-05-18 ENCOUNTER — Ambulatory Visit: Payer: Medicare Other | Admitting: Family Medicine

## 2015-05-19 ENCOUNTER — Telehealth: Payer: Self-pay | Admitting: Family Medicine

## 2015-05-19 NOTE — Telephone Encounter (Signed)
Pt was no show 05/11/15 9:45am for acute appt, pt did not reschedule, charge or no charge?

## 2015-05-19 NOTE — Telephone Encounter (Signed)
No charge- appt was scheduled by Janett Billow via pt's MyChart but she did not check her MyChart and did not know she had an appt

## 2015-06-16 ENCOUNTER — Encounter: Payer: Self-pay | Admitting: *Deleted

## 2015-07-12 ENCOUNTER — Other Ambulatory Visit: Payer: Self-pay | Admitting: Family Medicine

## 2015-07-12 NOTE — Telephone Encounter (Signed)
Last OV 04/20/15 Cough medication last filled 09/09/13

## 2015-07-12 NOTE — Telephone Encounter (Signed)
Medication filled to pharmacy as requested.   

## 2015-08-11 ENCOUNTER — Other Ambulatory Visit: Payer: Self-pay | Admitting: Family Medicine

## 2015-08-11 NOTE — Telephone Encounter (Signed)
Medication filled to pharmacy as requested.   

## 2015-08-19 ENCOUNTER — Ambulatory Visit (INDEPENDENT_AMBULATORY_CARE_PROVIDER_SITE_OTHER): Payer: Medicare Other | Admitting: Family Medicine

## 2015-08-19 ENCOUNTER — Encounter: Payer: Self-pay | Admitting: Family Medicine

## 2015-08-19 VITALS — BP 130/80 | HR 77 | Temp 97.8°F | Resp 16 | Ht 64.0 in | Wt 169.2 lb

## 2015-08-19 DIAGNOSIS — E119 Type 2 diabetes mellitus without complications: Secondary | ICD-10-CM | POA: Diagnosis not present

## 2015-08-19 DIAGNOSIS — R5383 Other fatigue: Secondary | ICD-10-CM | POA: Diagnosis not present

## 2015-08-19 LAB — CBC WITH DIFFERENTIAL/PLATELET
BASOS ABS: 0 10*3/uL (ref 0.0–0.1)
Basophils Relative: 0.8 % (ref 0.0–3.0)
EOS ABS: 0.2 10*3/uL (ref 0.0–0.7)
Eosinophils Relative: 3.9 % (ref 0.0–5.0)
HCT: 39.7 % (ref 36.0–46.0)
Hemoglobin: 13.4 g/dL (ref 12.0–15.0)
LYMPHS ABS: 1.9 10*3/uL (ref 0.7–4.0)
Lymphocytes Relative: 31.8 % (ref 12.0–46.0)
MCHC: 33.9 g/dL (ref 30.0–36.0)
MCV: 88.7 fl (ref 78.0–100.0)
MONO ABS: 0.5 10*3/uL (ref 0.1–1.0)
MONOS PCT: 7.6 % (ref 3.0–12.0)
NEUTROS PCT: 55.9 % (ref 43.0–77.0)
Neutro Abs: 3.4 10*3/uL (ref 1.4–7.7)
PLATELETS: 296 10*3/uL (ref 150.0–400.0)
RBC: 4.47 Mil/uL (ref 3.87–5.11)
RDW: 14.1 % (ref 11.5–15.5)
WBC: 6.1 10*3/uL (ref 4.0–10.5)

## 2015-08-19 LAB — HEPATIC FUNCTION PANEL
ALBUMIN: 4.8 g/dL (ref 3.5–5.2)
ALK PHOS: 68 U/L (ref 39–117)
ALT: 28 U/L (ref 0–35)
AST: 24 U/L (ref 0–37)
Bilirubin, Direct: 0.1 mg/dL (ref 0.0–0.3)
TOTAL PROTEIN: 7.5 g/dL (ref 6.0–8.3)
Total Bilirubin: 0.4 mg/dL (ref 0.2–1.2)

## 2015-08-19 LAB — BASIC METABOLIC PANEL
BUN: 18 mg/dL (ref 6–23)
CHLORIDE: 106 meq/L (ref 96–112)
CO2: 23 meq/L (ref 19–32)
Calcium: 10 mg/dL (ref 8.4–10.5)
Creatinine, Ser: 0.79 mg/dL (ref 0.40–1.20)
GFR: 75.24 mL/min (ref >60.00)
GLUCOSE: 92 mg/dL (ref 70–99)
POTASSIUM: 4.2 meq/L (ref 3.5–5.1)
SODIUM: 138 meq/L (ref 135–145)

## 2015-08-19 LAB — TSH: TSH: 1.82 u[IU]/mL (ref 0.35–4.50)

## 2015-08-19 LAB — HEMOGLOBIN A1C: Hgb A1c MFr Bld: 6.3 % (ref 4.6–6.5)

## 2015-08-19 NOTE — Progress Notes (Signed)
Pre visit review using our clinic review tool, if applicable. No additional management support is needed unless otherwise documented below in the visit note. 

## 2015-08-19 NOTE — Progress Notes (Signed)
   Subjective:    Patient ID: [name], female    DOB: 11/22/1940, 75 y.o.   MRN: [id number]  HPI DM- chronic problem, on Victoza daily.  UTD on foot exam, microalbumin.  Due for eye exam- scheduled for later this month.  Pt is having mid-morning hand tremors.  Not checking CBGs.  No CP, SOB, HAs, visual changes, edema, numbness/tingling of hands/feet.  + fatigue- husband was recently hospitalized, pt is not sleeping, very stressful as he starts dialysis   Review of Systems For ROS see HPI     Objective:   Physical Exam  Constitutional: She is oriented to person, place, and time. She appears well-developed and well-nourished. No distress.  HENT:  Head: Normocephalic and atraumatic.  Eyes: Conjunctivae and EOM are normal. Pupils are equal, round, and reactive to light.  Neck: Normal range of motion. Neck supple. No thyromegaly present.  Cardiovascular: Normal rate, regular rhythm, normal heart sounds and intact distal pulses.   No murmur heard. Pulmonary/Chest: Effort normal and breath sounds normal. No respiratory distress.  Abdominal: Soft. She exhibits no distension. There is no tenderness.  Musculoskeletal: She exhibits no edema.  Lymphadenopathy:    She has no cervical adenopathy.  Neurological: She is alert and oriented to person, place, and time.  Skin: Skin is warm and dry.  Psychiatric: She has a normal mood and affect. Her behavior is normal.  Vitals reviewed.         Assessment & Plan:

## 2015-08-19 NOTE — Assessment & Plan Note (Signed)
Chronic problem.  Having some mid-morning lows after Victoza dose.  Recommended mid-morning snack.  Pt has eye exam scheduled.  UTD on foot exam and microalbumin.  Check labs.  Adjust meds prn

## 2015-08-19 NOTE — Patient Instructions (Signed)
Schedule your complete physical in 6 months We'll notify you of your lab results and make any changes if needed Make sure you have a mid-morning snack to avoid your sugar dropping low Make sure you are resting!  Your body needs to recover from all the recent stressors! Call with any questions or concerns If you want to join [place] at the new [place] office, any scheduled appointments will automatically transfer and we will see you at [address] [place] [address] [place], [place], [place] [address] ([place]) [place] in there!!!

## 2015-08-19 NOTE — Assessment & Plan Note (Signed)
New.  Pt is very worried about her level of fatigue but I suspect this is due to her recent stress level and poor sleep w/ husbands looming dialysis and recent hospitalization.  Check labs to r/o metabolic cause.  Will follow.

## 2015-08-23 ENCOUNTER — Other Ambulatory Visit: Payer: Self-pay | Admitting: Family Medicine

## 2015-08-23 NOTE — Telephone Encounter (Signed)
Medication filled to pharmacy as requested.   

## 2015-08-27 ENCOUNTER — Other Ambulatory Visit: Payer: Self-pay | Admitting: Family Medicine

## 2015-08-27 NOTE — Telephone Encounter (Signed)
Medication filled to pharmacy as requested.   

## 2015-08-27 NOTE — Telephone Encounter (Signed)
Please advise 

## 2015-08-30 DIAGNOSIS — M545 Low back pain: Secondary | ICD-10-CM | POA: Diagnosis not present

## 2015-08-30 DIAGNOSIS — M546 Pain in thoracic spine: Secondary | ICD-10-CM | POA: Diagnosis not present

## 2015-08-30 DIAGNOSIS — M5126 Other intervertebral disc displacement, lumbar region: Secondary | ICD-10-CM | POA: Diagnosis not present

## 2015-08-30 DIAGNOSIS — M9905 Segmental and somatic dysfunction of pelvic region: Secondary | ICD-10-CM | POA: Diagnosis not present

## 2015-08-30 DIAGNOSIS — M9902 Segmental and somatic dysfunction of thoracic region: Secondary | ICD-10-CM | POA: Diagnosis not present

## 2015-08-30 DIAGNOSIS — M9903 Segmental and somatic dysfunction of lumbar region: Secondary | ICD-10-CM | POA: Diagnosis not present

## 2015-09-17 ENCOUNTER — Telehealth: Payer: Self-pay | Admitting: Family Medicine

## 2015-09-17 NOTE — Telephone Encounter (Signed)
We don't treat for exposure- only if she develops symptoms.  If she develops redness, drainage, etc we'd be happy to treat

## 2015-09-17 NOTE — Telephone Encounter (Signed)
Spoke with patients husband Josph Macho, he states he will give her the message that per Dr Birdie Riddle she will treat only if develops symptoms, gave symptoms to look out for

## 2015-09-17 NOTE — Telephone Encounter (Signed)
Pt states that she has been around family with pink eye and asking for drops to be called into pharmacy. Pt would like a call back when complete.

## 2015-09-27 DIAGNOSIS — J069 Acute upper respiratory infection, unspecified: Secondary | ICD-10-CM | POA: Diagnosis not present

## 2015-10-25 ENCOUNTER — Other Ambulatory Visit: Payer: Self-pay | Admitting: General Practice

## 2015-10-25 NOTE — Telephone Encounter (Signed)
Med denied.  

## 2015-10-26 DIAGNOSIS — H401131 Primary open-angle glaucoma, bilateral, mild stage: Secondary | ICD-10-CM | POA: Diagnosis not present

## 2015-10-26 DIAGNOSIS — Z961 Presence of intraocular lens: Secondary | ICD-10-CM | POA: Diagnosis not present

## 2015-10-26 DIAGNOSIS — E119 Type 2 diabetes mellitus without complications: Secondary | ICD-10-CM | POA: Diagnosis not present

## 2015-10-29 ENCOUNTER — Other Ambulatory Visit: Payer: Self-pay | Admitting: Family Medicine

## 2015-12-03 ENCOUNTER — Other Ambulatory Visit: Payer: Self-pay | Admitting: General Practice

## 2015-12-03 MED ORDER — FENOFIBRATE 145 MG PO TABS: ORAL_TABLET | ORAL | Status: DC

## 2015-12-22 ENCOUNTER — Encounter: Payer: Self-pay | Admitting: Family Medicine

## 2015-12-22 DIAGNOSIS — M25511 Pain in right shoulder: Secondary | ICD-10-CM

## 2015-12-23 NOTE — Telephone Encounter (Signed)
Referral placed.

## 2016-01-18 DIAGNOSIS — M25511 Pain in right shoulder: Secondary | ICD-10-CM | POA: Diagnosis not present

## 2016-01-29 LAB — HM DIABETES EYE EXAM

## 2016-02-11 ENCOUNTER — Telehealth: Payer: Self-pay | Admitting: Emergency Medicine

## 2016-02-11 MED ORDER — CITALOPRAM HYDROBROMIDE 20 MG PO TABS: 20.0000 mg | ORAL_TABLET | Freq: Every day | ORAL | 3 refills | Status: DC

## 2016-02-11 NOTE — Telephone Encounter (Signed)
Ok to switch to Celexa 20mg  daily- she will need to f/u in 4-6 weeks to see how things are going b/c we may need to increase her medication

## 2016-02-11 NOTE — Telephone Encounter (Signed)
Patient called requesting to switch her medication Effexor XR 75 mg. Patient states she has a reaction to filler in the generic medication. The price of the Effexor is increasing. Her insurance is only paying half of the medication. Effexor is costing her $200 monthly and she can't afford on her fixed income. She would like to try another medication. She offered Celexa. Please advise. Patient would like to talk with Janett Billow. She will be back home after 3 pm.

## 2016-02-11 NOTE — Addendum Note (Signed)
Addended by: Davis Gourd on: 02/11/2016 03:48 PM   Modules accepted: Orders

## 2016-02-11 NOTE — Telephone Encounter (Signed)
Pt informed med filled to [facility] and we will reevaluate med at follow up appt on 9/12 at her Wellness exam.

## 2016-02-14 DIAGNOSIS — M546 Pain in thoracic spine: Secondary | ICD-10-CM | POA: Diagnosis not present

## 2016-02-14 DIAGNOSIS — M5126 Other intervertebral disc displacement, lumbar region: Secondary | ICD-10-CM | POA: Diagnosis not present

## 2016-02-14 DIAGNOSIS — M9903 Segmental and somatic dysfunction of lumbar region: Secondary | ICD-10-CM | POA: Diagnosis not present

## 2016-02-14 DIAGNOSIS — M9902 Segmental and somatic dysfunction of thoracic region: Secondary | ICD-10-CM | POA: Diagnosis not present

## 2016-02-14 DIAGNOSIS — M9905 Segmental and somatic dysfunction of pelvic region: Secondary | ICD-10-CM | POA: Diagnosis not present

## 2016-02-14 DIAGNOSIS — M545 Low back pain: Secondary | ICD-10-CM | POA: Diagnosis not present

## 2016-02-23 ENCOUNTER — Telehealth: Payer: Self-pay | Admitting: Family Medicine

## 2016-02-23 NOTE — Telephone Encounter (Signed)
Called the patient and left a detailed message on voicemail to inform of PCP recommendations.

## 2016-02-23 NOTE — Telephone Encounter (Signed)
Patient Name: [name] DOB: 04/04/1941 Initial Comment BS 248-250 range this morning. after eating and taking her med. nausea, dizzy, warmness at times. if no answer the first time, phone has issues, she request that you hang up and just call right back. Nurse Assessment Nurse: Ronnald Ramp, RN, Miranda Date/Time (Eastern Time): 02/23/2016 10:16:16 AM Confirm and document reason for call. If symptomatic, describe symptoms. You must click the next button to save text entered. ---Caller states last week she felt sick and dizzy during exercise. Last night she decided to take check her BS. Her BS was 300 and today it is 296. She also has dizziness, nausea, and felt flush this morning. Has the patient traveled out of the country within the last 30 days? ---Not Applicable Does the patient have any new or worsening symptoms? ---Yes Will a triage be completed? ---Yes Related visit to physician within the last 2 weeks? ---No Does the PT have any chronic conditions? (i.e. diabetes, asthma, etc.) ---Yes List chronic conditions. ---Diabetes, Asthma Is this a behavioral health or substance abuse call? ---No Guidelines Guideline Title Affirmed Question Affirmed Notes Diabetes - High Blood Sugar [1] Blood glucose > 300 mg/dl (16.5 mmol/ l) AND [2] two or more times in a row Final Disposition User Call PCP Now Ronnald Ramp, RN, Miranda Comments Caller states she is unable to be seen this week. She has an appt for physical next week on Tuesday. Told caller I would forward message to the MD to review to if anything needs to be done before the appt. Told caller to plan on being fasting for her appt on Tuesday in case the MD wants to order labs. Referrals GO TO FACILITY OTHER - SPECIFY Disagree/Comply: Disagree

## 2016-02-23 NOTE — Telephone Encounter (Signed)
Pt needs to be very mindful of what she is eating- low carb, low sugar, limit breads/rice/pasta/potatoes and increase her Victoza to 1.8 daily

## 2016-02-29 ENCOUNTER — Ambulatory Visit (INDEPENDENT_AMBULATORY_CARE_PROVIDER_SITE_OTHER): Payer: Medicare Other | Admitting: Family Medicine

## 2016-02-29 ENCOUNTER — Encounter: Payer: Self-pay | Admitting: General Practice

## 2016-02-29 ENCOUNTER — Encounter: Payer: Self-pay | Admitting: Family Medicine

## 2016-02-29 VITALS — BP 128/83 | HR 67 | Temp 98.1°F | Resp 16 | Ht 64.0 in | Wt 170.4 lb

## 2016-02-29 DIAGNOSIS — E119 Type 2 diabetes mellitus without complications: Secondary | ICD-10-CM | POA: Diagnosis not present

## 2016-02-29 DIAGNOSIS — Z0000 Encounter for general adult medical examination without abnormal findings: Secondary | ICD-10-CM | POA: Diagnosis not present

## 2016-02-29 DIAGNOSIS — E781 Pure hyperglyceridemia: Secondary | ICD-10-CM

## 2016-02-29 DIAGNOSIS — Z23 Encounter for immunization: Secondary | ICD-10-CM | POA: Diagnosis not present

## 2016-02-29 LAB — CBC WITH DIFFERENTIAL/PLATELET
BASOS PCT: 0.7 % (ref 0.0–3.0)
Basophils Absolute: 0 10*3/uL (ref 0.0–0.1)
EOS ABS: 0.2 10*3/uL (ref 0.0–0.7)
Eosinophils Relative: 3.3 % (ref 0.0–5.0)
HCT: 39 % (ref 36.0–46.0)
Hemoglobin: 13.5 g/dL (ref 12.0–15.0)
LYMPHS ABS: 1.8 10*3/uL (ref 0.7–4.0)
Lymphocytes Relative: 28.5 % (ref 12.0–46.0)
MCHC: 34.6 g/dL (ref 30.0–36.0)
MCV: 87.9 fl (ref 78.0–100.0)
Monocytes Absolute: 0.4 10*3/uL (ref 0.1–1.0)
Monocytes Relative: 6.6 % (ref 3.0–12.0)
NEUTROS ABS: 3.8 10*3/uL (ref 1.4–7.7)
NEUTROS PCT: 60.9 % (ref 43.0–77.0)
PLATELETS: 303 10*3/uL (ref 150.0–400.0)
RBC: 4.44 Mil/uL (ref 3.87–5.11)
RDW: 13.4 % (ref 11.5–15.5)
WBC: 6.3 10*3/uL (ref 4.0–10.5)

## 2016-02-29 LAB — BASIC METABOLIC PANEL
BUN: 18 mg/dL (ref 6–23)
CALCIUM: 9.6 mg/dL (ref 8.4–10.5)
CHLORIDE: 104 meq/L (ref 96–112)
CO2: 28 meq/L (ref 19–32)
Creatinine, Ser: 0.77 mg/dL (ref 0.40–1.20)
GFR: 77.39 mL/min (ref >60.00)
GLUCOSE: 119 mg/dL — AB (ref 70–99)
POTASSIUM: 4.3 meq/L (ref 3.5–5.1)
Sodium: 137 mEq/L (ref 135–145)

## 2016-02-29 LAB — MICROALBUMIN / CREATININE URINE RATIO
CREATININE, U: 140.4 mg/dL
Microalb Creat Ratio: 0.6 mg/g (ref 0.0–30.0)
Microalb, Ur: 0.9 mg/dL (ref 0.0–1.9)

## 2016-02-29 LAB — HEPATIC FUNCTION PANEL
ALK PHOS: 46 U/L (ref 39–117)
ALT: 40 U/L — AB (ref 0–35)
AST: 41 U/L — ABNORMAL HIGH (ref 0–37)
Albumin: 4.6 g/dL (ref 3.5–5.2)
BILIRUBIN DIRECT: 0.1 mg/dL (ref 0.0–0.3)
BILIRUBIN TOTAL: 0.5 mg/dL (ref 0.2–1.2)
TOTAL PROTEIN: 7 g/dL (ref 6.0–8.3)

## 2016-02-29 LAB — LIPID PANEL
Cholesterol: 196 mg/dL (ref 0–200)
HDL: 36 mg/dL — AB (ref >39.00)
NONHDL: 160.1
Total CHOL/HDL Ratio: 5
Triglycerides: 207 mg/dL — ABNORMAL HIGH (ref 0.0–149.0)
VLDL: 41.4 mg/dL — ABNORMAL HIGH (ref 0.0–40.0)

## 2016-02-29 LAB — LDL CHOLESTEROL, DIRECT: Direct LDL: 131 mg/dL

## 2016-02-29 LAB — TSH: TSH: 1.6 u[IU]/mL (ref 0.35–4.50)

## 2016-02-29 LAB — HEMOGLOBIN A1C: Hgb A1c MFr Bld: 7 % — ABNORMAL HIGH (ref 4.6–6.5)

## 2016-02-29 NOTE — Progress Notes (Signed)
Pre visit review using our clinic review tool, if applicable. No additional management support is needed unless otherwise documented below in the visit note. 

## 2016-02-29 NOTE — Assessment & Plan Note (Signed)
Pt's PE WNL and unchanged from previous.  Pt declines mammo.  UTD on colonoscopy- will not need another unless a problem arises.  Written screening schedule updated and given to pt.  Flu shot given today.  Check labs.  Anticipatory guidance provided.

## 2016-02-29 NOTE — Progress Notes (Signed)
   Subjective:    Patient ID: [name], female    DOB: March 30, 1941, 75 y.o.   MRN: [id number]  HPI Here today for CPE.  Risk Factors: DM- chronic problem, on Victoza 1.2mg  daily.  UTD on eye exam.  Due for foot exam.  Will get repeat microalbumin today.  Pt reports home CBGs are 'crazy' recently. Pt had episode of dizziness, nausea, and diaphoresis/pallor while at yoga.  + fatigue but pt has had very stressful year.  No numbness/tingling of hands/feet.  Pt increased Victoza to 1.8 Hyperlipidemia- chronic problem, on Fenofibrate daily. Denies abd pain, N/V, myalgias Physical Activity: exercising regularly Fall Risk: low risk Depression: chronic problem, on Celexa daily Hearing: normal to conversational tones even w/ hearing aides, mildly decreased to whispered voice ADL's: independent Cognitive: normal linear thought process, memory and attention intact Home Safety: safe at home, lives w/ husband Height, Weight, BMI, Visual Acuity: see vitals, vision corrected to 20/20 w/ cataract surgery Counseling: UTD on colonoscopy- no need to repeat.  Pt refuses mammo.  Due for flu today. Care team reviewed and updated Labs Ordered: See A&P Care Plan: See A&P    Review of Systems Patient reports no vision/ hearing changes, adenopathy,fever, weight change,  persistant/recurrent hoarseness , swallowing issues, chest pain, palpitations, edema, persistant/recurrent cough, hemoptysis, dyspnea (rest/exertional/paroxysmal nocturnal), gastrointestinal bleeding (melena, rectal bleeding), abdominal pain, significant heartburn, bowel changes, GU symptoms (dysuria, hematuria, incontinence), Gyn symptoms (abnormal  bleeding, pain),  syncope, focal weakness, memory loss, numbness & tingling, skin/hair/nail changes, abnormal bruising or bleeding, anxiety, or depression.     Objective:   Physical Exam General Appearance:    Alert, cooperative, no distress, appears stated age  Head:    Normocephalic, without  obvious abnormality, atraumatic  Eyes:    PERRL, conjunctiva/corneas clear, EOM's intact, fundi    benign, both eyes  Ears:    Normal TM's and external ear canals, both ears  Nose:   Nares normal, septum midline, mucosa normal, no drainage    or sinus tenderness  Throat:   Lips, mucosa, and tongue normal; teeth and gums normal  Neck:   Supple, symmetrical, trachea midline, no adenopathy;    Thyroid: no enlargement/tenderness/nodules  Back:     Symmetric, no curvature, ROM normal, no CVA tenderness  Lungs:     Clear to auscultation bilaterally, respirations unlabored  Chest Wall:    No tenderness or deformity   Heart:    Regular rate and rhythm, S1 and S2 normal, no murmur, rub   or gallop  Breast Exam:    Deferred at pt's request  Abdomen:     Soft, non-tender, bowel sounds active all four quadrants,    no masses, no organomegaly  Genitalia:    Deferred  Rectal:    Extremities:   Extremities normal, atraumatic, no cyanosis or edema  Pulses:   2+ and symmetric all extremities  Skin:   Skin color, texture, turgor normal, no rashes or lesions  Lymph nodes:   Cervical, supraclavicular, and axillary nodes normal  Neurologic:   CNII-XII intact, normal strength, sensation and reflexes    throughout          Assessment & Plan:

## 2016-02-29 NOTE — Patient Instructions (Signed)
Follow up in 3-4 months to recheck diabetes We'll notify you of your lab results and make any changes if needed Continue to work on healthy diet and regular exercise- you look great! Make sure you are eating regularly and being mindful of your carb intake- sugar, breads, pasta, rice, potatoes, etc You are up to date on colonoscopy and you will not need another You have declined mammogram today You are up to date on your shots- yay!! Check at home and see if you can find the Belsomra to help w/ sleep Call with any questions or concerns Hang in there!  You're doing great!

## 2016-02-29 NOTE — Assessment & Plan Note (Signed)
Chronic problem.  Pt is on Victoza.  After she sent MyChart message indicating that sugar was nearing 300 we increased the Victoza to 1.8mg  daily.  Discussed that stress can elevate sugars.  Reviewed need for close attention to diet and exercise.  UTD on eye exam, foot exam done today.  Repeat microalbumin.  Reviewed supportive care and red flags that should prompt return.  Pt expressed understanding and is in agreement w/ plan.

## 2016-02-29 NOTE — Assessment & Plan Note (Signed)
Chronic problem.  Tolerating fenofibrate w/o difficulty.  Stressed need for healthy diet and regular exercise.  Check labs.  Adjust meds prn  

## 2016-03-01 ENCOUNTER — Other Ambulatory Visit: Payer: Self-pay | Admitting: Family Medicine

## 2016-03-01 ENCOUNTER — Other Ambulatory Visit: Payer: Self-pay | Admitting: General Practice

## 2016-03-01 DIAGNOSIS — E785 Hyperlipidemia, unspecified: Secondary | ICD-10-CM

## 2016-03-01 MED ORDER — ATORVASTATIN CALCIUM 20 MG PO TABS: 20.0000 mg | ORAL_TABLET | Freq: Every day | ORAL | 6 refills | Status: DC

## 2016-03-02 ENCOUNTER — Telehealth: Payer: Self-pay | Admitting: Emergency Medicine

## 2016-03-02 MED ORDER — DOXEPIN HCL 3 MG PO TABS: 1.0000 | ORAL_TABLET | Freq: Every evening | ORAL | 3 refills | Status: DC | PRN

## 2016-03-02 NOTE — Telephone Encounter (Signed)
Med filled and pt made aware.  

## 2016-03-02 NOTE — Telephone Encounter (Signed)
I am not sure how much it would cost for her to try Doxepin but this would be another option since the Belsomra is so expensive.  3mg , 1 tab QHS, #30, 3 refills

## 2016-03-02 NOTE — Telephone Encounter (Signed)
Pharmacist from [facility] called with questions about patient medication. Patient is currently taking Fenofibrate for cholesterol and new rx for Lipitor was sent to the pharmacy. I see the results for My Chart.  Patient states a rx for sleep was suppose to be sent to the pharmacy. Please advise.

## 2016-03-02 NOTE — Telephone Encounter (Signed)
Yes- new script for Lipitor sent to be taken in addition to Fenofibrate Pt was to look at home to see if she had Belsomra.  If not, we can send in new prescription but it was not sent yet.

## 2016-03-02 NOTE — Addendum Note (Signed)
Addended by: Davis Gourd on: 03/02/2016 04:55 PM   Modules accepted: Orders

## 2016-03-02 NOTE — Telephone Encounter (Signed)
Advised patient we sent the Lipitor to the pharmacy to take along with the Fenofibrate due to elevated LDL. She is agreeable with starting the medication. Patient admits she has increased her use of alcohol to help with sleep. Patient states she did not get the Belsomra filled due to cost. It costs her over $ 200.

## 2016-03-02 NOTE — Telephone Encounter (Signed)
Please advise on the sleep medication?  I thought you told her to look at home for her Belsomra?

## 2016-03-03 ENCOUNTER — Telehealth: Payer: Self-pay | Admitting: Emergency Medicine

## 2016-03-03 NOTE — Telephone Encounter (Signed)
[employer] Prior authorization  Reference # [id number] Silenor 3 mg approved for non-formulary exception thru 06/18/2016 under Medicare Part D. [facility] notified of approval by fax

## 2016-03-15 DIAGNOSIS — M25511 Pain in right shoulder: Secondary | ICD-10-CM | POA: Diagnosis not present

## 2016-03-27 DIAGNOSIS — M25511 Pain in right shoulder: Secondary | ICD-10-CM | POA: Diagnosis not present

## 2016-03-29 ENCOUNTER — Encounter: Payer: Self-pay | Admitting: Family Medicine

## 2016-03-29 MED ORDER — CITALOPRAM HYDROBROMIDE 40 MG PO TABS: 40.0000 mg | ORAL_TABLET | Freq: Every day | ORAL | 3 refills | Status: DC

## 2016-04-04 DIAGNOSIS — M19011 Primary osteoarthritis, right shoulder: Secondary | ICD-10-CM | POA: Diagnosis not present

## 2016-04-04 DIAGNOSIS — M75101 Unspecified rotator cuff tear or rupture of right shoulder, not specified as traumatic: Secondary | ICD-10-CM | POA: Diagnosis not present

## 2016-04-26 ENCOUNTER — Other Ambulatory Visit (INDEPENDENT_AMBULATORY_CARE_PROVIDER_SITE_OTHER): Payer: Medicare Other

## 2016-04-26 DIAGNOSIS — E785 Hyperlipidemia, unspecified: Secondary | ICD-10-CM

## 2016-04-26 LAB — HEPATIC FUNCTION PANEL
ALK PHOS: 39 U/L (ref 39–117)
ALT: 44 U/L — ABNORMAL HIGH (ref 0–35)
AST: 43 U/L — ABNORMAL HIGH (ref 0–37)
Albumin: 4.5 g/dL (ref 3.5–5.2)
BILIRUBIN DIRECT: 0.1 mg/dL (ref 0.0–0.3)
BILIRUBIN TOTAL: 0.6 mg/dL (ref 0.2–1.2)
TOTAL PROTEIN: 6.8 g/dL (ref 6.0–8.3)

## 2016-04-27 ENCOUNTER — Encounter: Payer: Self-pay | Admitting: Family Medicine

## 2016-05-02 ENCOUNTER — Encounter: Payer: Self-pay | Admitting: Family Medicine

## 2016-05-02 ENCOUNTER — Telehealth: Payer: Self-pay | Admitting: General Practice

## 2016-05-02 DIAGNOSIS — H401131 Primary open-angle glaucoma, bilateral, mild stage: Secondary | ICD-10-CM | POA: Diagnosis not present

## 2016-05-02 DIAGNOSIS — R0602 Shortness of breath: Secondary | ICD-10-CM

## 2016-05-02 DIAGNOSIS — E119 Type 2 diabetes mellitus without complications: Secondary | ICD-10-CM | POA: Diagnosis not present

## 2016-05-02 DIAGNOSIS — Z961 Presence of intraocular lens: Secondary | ICD-10-CM | POA: Diagnosis not present

## 2016-05-02 NOTE — Telephone Encounter (Signed)
Spoke with patient regarding pulmonary symptoms. Patient reports non productive cough (worse at night) x 3 weeks. Afebrile, occasionally SOB.  Patient is concerned due to her h/o bronchitis and pneumonia. Patient advised pulmonary referral placed, appointment to see PCP scheduled for 05/03/16.

## 2016-05-02 NOTE — Telephone Encounter (Signed)
  Referral placed and pt informed on mychart, could you please triage pt?    [place] for referral to pulmonary but please have someone triage her to make sure she doesn't need to be seen sooner for evaluation    KT  ----- Message -----  From: Davis Gourd, CMA  Sent: 05/02/2016  8:58 AM  To: Midge Minium, MD  Subject: FW: Visit [place] for referral?     ----- Message -----  From: Lottie Dawson  Sent: 05/02/2016  8:40 AM  To: [facility] Clinical Pool  Subject: Visit Follow-Up Question               I am experiencing chest/lung discomfort as if my lungs are not functioning properly. I do not feel a specific pain, but discomfort in breathing. Please refer me to a specialist to check this problem out - remember I have had pneumonia sever times in the past.

## 2016-05-03 ENCOUNTER — Ambulatory Visit (INDEPENDENT_AMBULATORY_CARE_PROVIDER_SITE_OTHER): Payer: Medicare Other | Admitting: Family Medicine

## 2016-05-03 ENCOUNTER — Encounter: Payer: Self-pay | Admitting: Family Medicine

## 2016-05-03 VITALS — BP 126/86 | HR 87 | Temp 98.0°F | Resp 17 | Ht 64.0 in | Wt 171.4 lb

## 2016-05-03 DIAGNOSIS — J4541 Moderate persistent asthma with (acute) exacerbation: Secondary | ICD-10-CM | POA: Diagnosis not present

## 2016-05-03 DIAGNOSIS — T161XXA Foreign body in right ear, initial encounter: Secondary | ICD-10-CM | POA: Diagnosis not present

## 2016-05-03 MED ORDER — ALBUTEROL SULFATE (2.5 MG/3ML) 0.083% IN NEBU
2.5000 mg | INHALATION_SOLUTION | Freq: Once | RESPIRATORY_TRACT | Status: AC
  Administered 2016-05-03: 2.5 mg via RESPIRATORY_TRACT

## 2016-05-03 MED ORDER — LIRAGLUTIDE 18 MG/3ML ~~LOC~~ SOPN: 1.8000 mg | PEN_INJECTOR | Freq: Every day | SUBCUTANEOUS | 1 refills | Status: DC

## 2016-05-03 MED ORDER — ALBUTEROL SULFATE HFA 108 (90 BASE) MCG/ACT IN AERS: 2.0000 | INHALATION_SPRAY | RESPIRATORY_TRACT | 6 refills | Status: DC | PRN

## 2016-05-03 NOTE — Progress Notes (Signed)
   Subjective:    Patient ID: [name], female    DOB: 24-Aug-1940, 75 y.o.   MRN: [id number]  HPI Cough- pt reports cough started ~3 weeks ago.  Cough is dry.  Worse w/ deep breaths, talking, activity.  Denies fever, chills.  Denies sinus pain/pressure.  + seasonal allergies.  Some 'irritation' of throat w/ PND.  Denies excessive throat clearing.  R ear foreign body- pt was unaware of presence of foreign body   Review of Systems For ROS see HPI     Objective:   Physical Exam  Constitutional: She is oriented to person, place, and time. She appears well-developed and well-nourished. No distress.  HENT:  Head: Normocephalic and atraumatic.  Foreign body in R ear, removed w/ alligator forceps.  TMs normal bilaterally Mild nasal congestion Throat w/out erythema, edema, or exudate  Eyes: Conjunctivae and EOM are normal. Pupils are equal, round, and reactive to light.  Neck: Normal range of motion. Neck supple.  Cardiovascular: Normal rate, regular rhythm, normal heart sounds and intact distal pulses.   No murmur heard. Pulmonary/Chest: Effort normal and breath sounds normal. No respiratory distress. She has no wheezes.  + hacking cough w/ RUL wheezes- improved s/p neb tx  Lymphadenopathy:    She has no cervical adenopathy.  Neurological: She is alert and oriented to person, place, and time.  Skin: Skin is warm and dry.  Psychiatric: She has a normal mood and affect. Her behavior is normal. Thought content normal.  Vitals reviewed.         Assessment & Plan:  Asthma- deteriorated. Pt is struggling w/ cough variant asthma at this time- likely due to post nasal drip.  Start daily antihistamine.  Albuterol inhaler prn as sxs improved s/p neb tx in office.  Reviewed supportive care and red flags that should prompt return.  Pt expressed understanding and is in agreement w/ plan.   Foreign body R ear- successfully removed w/ alligator forceps and it was the end of her hearing aid.

## 2016-05-03 NOTE — Addendum Note (Signed)
Addended by: Desmond Dike L on: 05/03/2016 11:25 AM   Modules accepted: Orders

## 2016-05-03 NOTE — Patient Instructions (Signed)
Follow up as needed Start Claritin or Zyrtec to decrease daily postnasal drip Drink plenty of fluids Use the Albuterol inhaler as needed for cough/wheezing/shortness of breath Call with any questions or concerns Hang in there!!!

## 2016-05-03 NOTE — Progress Notes (Signed)
Pre visit review using our clinic review tool, if applicable. No additional management support is needed unless otherwise documented below in the visit note. 

## 2016-05-29 DIAGNOSIS — M545 Low back pain: Secondary | ICD-10-CM | POA: Diagnosis not present

## 2016-05-29 DIAGNOSIS — M546 Pain in thoracic spine: Secondary | ICD-10-CM | POA: Diagnosis not present

## 2016-05-29 DIAGNOSIS — M5126 Other intervertebral disc displacement, lumbar region: Secondary | ICD-10-CM | POA: Diagnosis not present

## 2016-05-29 DIAGNOSIS — M9903 Segmental and somatic dysfunction of lumbar region: Secondary | ICD-10-CM | POA: Diagnosis not present

## 2016-05-29 DIAGNOSIS — M9905 Segmental and somatic dysfunction of pelvic region: Secondary | ICD-10-CM | POA: Diagnosis not present

## 2016-05-29 DIAGNOSIS — M9902 Segmental and somatic dysfunction of thoracic region: Secondary | ICD-10-CM | POA: Diagnosis not present

## 2016-06-02 DIAGNOSIS — M545 Low back pain: Secondary | ICD-10-CM | POA: Diagnosis not present

## 2016-06-02 DIAGNOSIS — M9902 Segmental and somatic dysfunction of thoracic region: Secondary | ICD-10-CM | POA: Diagnosis not present

## 2016-06-02 DIAGNOSIS — M5126 Other intervertebral disc displacement, lumbar region: Secondary | ICD-10-CM | POA: Diagnosis not present

## 2016-06-02 DIAGNOSIS — M9903 Segmental and somatic dysfunction of lumbar region: Secondary | ICD-10-CM | POA: Diagnosis not present

## 2016-06-02 DIAGNOSIS — M546 Pain in thoracic spine: Secondary | ICD-10-CM | POA: Diagnosis not present

## 2016-06-02 DIAGNOSIS — M9905 Segmental and somatic dysfunction of pelvic region: Secondary | ICD-10-CM | POA: Diagnosis not present

## 2016-06-06 DIAGNOSIS — M9902 Segmental and somatic dysfunction of thoracic region: Secondary | ICD-10-CM | POA: Diagnosis not present

## 2016-06-06 DIAGNOSIS — M9903 Segmental and somatic dysfunction of lumbar region: Secondary | ICD-10-CM | POA: Diagnosis not present

## 2016-06-06 DIAGNOSIS — M545 Low back pain: Secondary | ICD-10-CM | POA: Diagnosis not present

## 2016-06-06 DIAGNOSIS — M5126 Other intervertebral disc displacement, lumbar region: Secondary | ICD-10-CM | POA: Diagnosis not present

## 2016-06-06 DIAGNOSIS — M546 Pain in thoracic spine: Secondary | ICD-10-CM | POA: Diagnosis not present

## 2016-06-06 DIAGNOSIS — M9905 Segmental and somatic dysfunction of pelvic region: Secondary | ICD-10-CM | POA: Diagnosis not present

## 2016-06-09 DIAGNOSIS — M9902 Segmental and somatic dysfunction of thoracic region: Secondary | ICD-10-CM | POA: Diagnosis not present

## 2016-06-09 DIAGNOSIS — M5126 Other intervertebral disc displacement, lumbar region: Secondary | ICD-10-CM | POA: Diagnosis not present

## 2016-06-09 DIAGNOSIS — M545 Low back pain: Secondary | ICD-10-CM | POA: Diagnosis not present

## 2016-06-09 DIAGNOSIS — M9905 Segmental and somatic dysfunction of pelvic region: Secondary | ICD-10-CM | POA: Diagnosis not present

## 2016-06-09 DIAGNOSIS — M546 Pain in thoracic spine: Secondary | ICD-10-CM | POA: Diagnosis not present

## 2016-06-09 DIAGNOSIS — M9903 Segmental and somatic dysfunction of lumbar region: Secondary | ICD-10-CM | POA: Diagnosis not present

## 2016-06-29 ENCOUNTER — Ambulatory Visit: Payer: Medicare Other | Admitting: Family Medicine

## 2016-07-07 ENCOUNTER — Other Ambulatory Visit: Payer: Self-pay | Admitting: General Practice

## 2016-07-07 MED ORDER — FENOFIBRATE 145 MG PO TABS: ORAL_TABLET | ORAL | 6 refills | Status: DC

## 2016-07-10 ENCOUNTER — Telehealth: Payer: Self-pay | Admitting: Family Medicine

## 2016-07-10 DIAGNOSIS — E119 Type 2 diabetes mellitus without complications: Secondary | ICD-10-CM

## 2016-07-10 NOTE — Telephone Encounter (Signed)
Referral placed today

## 2016-07-10 NOTE — Telephone Encounter (Signed)
Patient requesting referral to see Dr. Loanne Drilling at [facility] for diabetes management.

## 2016-07-11 DIAGNOSIS — M75101 Unspecified rotator cuff tear or rupture of right shoulder, not specified as traumatic: Secondary | ICD-10-CM | POA: Diagnosis not present

## 2016-07-11 DIAGNOSIS — M19011 Primary osteoarthritis, right shoulder: Secondary | ICD-10-CM | POA: Diagnosis not present

## 2016-07-12 ENCOUNTER — Encounter: Payer: Self-pay | Admitting: Pulmonary Disease

## 2016-07-12 ENCOUNTER — Ambulatory Visit (INDEPENDENT_AMBULATORY_CARE_PROVIDER_SITE_OTHER): Payer: Medicare Other | Admitting: Pulmonary Disease

## 2016-07-12 VITALS — BP 130/70 | HR 83 | Ht 64.0 in | Wt 171.2 lb

## 2016-07-12 DIAGNOSIS — R058 Other specified cough: Secondary | ICD-10-CM

## 2016-07-12 DIAGNOSIS — J4540 Moderate persistent asthma, uncomplicated: Secondary | ICD-10-CM | POA: Diagnosis not present

## 2016-07-12 DIAGNOSIS — R05 Cough: Secondary | ICD-10-CM | POA: Diagnosis not present

## 2016-07-12 LAB — NITRIC OXIDE: NITRIC OXIDE: 11

## 2016-07-12 MED ORDER — MONTELUKAST SODIUM 10 MG PO TABS: 10.0000 mg | ORAL_TABLET | Freq: Every day | ORAL | 5 refills | Status: DC

## 2016-07-12 MED ORDER — FLUTICASONE PROPIONATE 50 MCG/ACT NA SUSP: 1.0000 | Freq: Every day | NASAL | 2 refills | Status: DC

## 2016-07-12 NOTE — Patient Instructions (Signed)
Nasal irrigation (salt water nose spray) daily Fluticasone (flonase) one spray in each nostril daily Montelukast (singulair) 10 mg pill nightly  Follow up in 4 weeks with Dr. Halford Chessman or Nurse Practitioner

## 2016-07-12 NOTE — Progress Notes (Signed)
Past surgical history She  has a past surgical history that includes Rotator cuff repair; Tonsillectomy; Abdominal hysterectomy; Deviated septum repair; Lithotripsy; Back surgery [id number]); Colonoscopy; Closed reduction nasal fracture (04/29/2012); and Cholecystectomy (09/26/13).  Family history Her family history includes Diabetes in her father.  Social history She  reports that she quit smoking about 30 years ago. Her smoking use included Cigarettes. She has a 0.40 pack-year smoking history. She has never used smokeless tobacco. She reports that she drinks alcohol. She reports that she does not use drugs.  Allergies  Allergen Reactions  . Papain Anaphylaxis    Takes an extreme concentration  . Papaya Derivatives Anaphylaxis  . Penicillins Other (See Comments)    Pt unsure. had a reaction so long ago, was told by providers to not use.   . Mushroom Ext Cmplx(Shiitake-Reishi-Mait) Other (See Comments)    Head congestion and sore throat  . Venlafaxine Other (See Comments)    Pt states she can take Name brand. Lethargy    Review of Systems  Constitutional: Negative for fever and unexpected weight change.  HENT: Negative for congestion, dental problem, ear pain, nosebleeds, postnasal drip, rhinorrhea, sinus pressure, sneezing, sore throat and trouble swallowing.   Eyes: Negative for redness and itching.  Respiratory: Positive for cough. Negative for chest tightness, shortness of breath and wheezing.   Cardiovascular: Negative for palpitations and leg swelling.  Gastrointestinal: Negative for nausea and vomiting.  Genitourinary: Negative for dysuria.  Musculoskeletal: Negative for joint swelling.  Skin: Negative for rash.  Neurological: Negative for headaches.  Hematological: Does not bruise/bleed easily.  Psychiatric/Behavioral: Negative for dysphoric mood. The patient is not nervous/anxious.     Current Outpatient Prescriptions on File Prior to Visit  Medication Sig  . acetaminophen  (TYLENOL) 500 MG tablet Take 500 mg by mouth every 6 (six) hours as needed.  Marland Kitchen albuterol (PROAIR HFA) 108 (90 Base) MCG/ACT inhaler Inhale 2 puffs into the lungs every 4 (four) hours as needed for wheezing or shortness of breath.  Marland Kitchen atorvastatin (LIPITOR) 20 MG tablet Take 1 tablet (20 mg total) by mouth daily.  . cetirizine (ZYRTEC) 10 MG tablet TAKE ONE (1) TABLET EACH DAY  . fenofibrate (TRICOR) 145 MG tablet TAKE ONE (1) TABLET EACH DAY AFTER BREKFAST  . Insulin Pen Needle 32G X 6 MM MISC 1 each by Does not apply route daily. To inject insulin.  Marland Kitchen liraglutide (VICTOZA) 18 MG/3ML SOPN Inject 0.3 mLs (1.8 mg total) into the skin daily.  Marland Kitchen LUMIGAN 0.01 % SOLN   . ONE TOUCH ULTRA TEST test strip USE 1 TIME A DAY  . ONETOUCH DELICA LANCETS 99991111 MISC Use one lancet to test sugars once daily. E11.9  . Biotin 5000 MCG CAPS Take 1 capsule by mouth daily.   No current facility-administered medications on file prior to visit.     Chief Complaint  Patient presents with  . Pulmonary Consult    Former [facility] patient for asthma. Pt c/o cough x 3 months.     Pulmonary tests Spirometry 04/12/10 >> FEV1 1.92 (100%), FEV1% 86 Spirometry 08/27/13 >> FEV1 1.83 (88%), FEV1% 82 FeNO 07/12/16 >> 11  Cardiac tests Echo 01/20/11 > EF 55 to 60%, grade 1 DD  Past medical history She  has a past medical history of Allergic rhinitis; Asthma; Diabetes mellitus; Glaucoma; Hyperlipemia; Kidney stone; Migraines; Palpitations; and Urinary tract infection.  Vital signs BP 130/70 (BP Location: Right Arm, Cuff Size: Normal)   Pulse 83   Ht 5\' 4"  (  1.626 m)   Wt 171 lb 3.2 oz (77.7 kg)   SpO2 96%   BMI 29.39 kg/m   History of present illness [name] is a 76 y.o. female with asthma.  She was previously followed by Dr. Joya Gaskins.  She used to get allergy shots.  She was allergic to cats, but had acupuncture and is able to tolerate cats know >> just has to wash her hands and not touch her eyes.  She had more  trouble with allergies in [place], and she thinks this was from pollution.  [place].  She had surgery for deviated septum and nasal polyps years ago, and this helped.  She has noticed more cough over the past several weeks.  This happens more when she lays down.  She takes a nasal decongestant pill daily.  Her cough is worse if she misses a dose.  She also gets cough when she takes a deep breath.  She will occasional get wheeze in her chest.  She feels proair helps her cough.  She is not using any nose sprays.  She gets stomach discomfort if she drinks carbonate beverages or alcohol.  Otherwise, she doesn't feel like she has reflux.  She hasn't notice a correlation of her cough with GI symptoms.  She denies recent travel or change in her medications.   Physical exam  General - No distress ENT - No sinus tenderness, no oral exudate, no LAN, no thyromegaly, TM clear, pupils equal/reactive Cardiac - s1s2 regular, no murmur, pulses symmetric Chest - No wheeze/rales/dullness, good air entry, normal respiratory excursion Back - No focal tenderness Abd - Soft, non-tender, no organomegaly, + bowel sounds Ext - No edema Neuro - Normal strength, cranial nerves intact Skin - No rashes Psych - Normal mood, and behavior   CMP Latest Ref Rng & Units 04/26/2016 02/29/2016 08/19/2015  Glucose 70 - 99 mg/dL - 119(H) 92  BUN 6 - 23 mg/dL - 18 18  Creatinine 0.40 - 1.20 mg/dL - 0.77 0.79  Sodium 135 - 145 mEq/L - 137 138  Potassium 3.5 - 5.1 mEq/L - 4.3 4.2  Chloride 96 - 112 mEq/L - 104 106  CO2 19 - 32 mEq/L - 28 23  Calcium 8.4 - 10.5 mg/dL - 9.6 10.0  Total Protein 6.0 - 8.3 g/dL 6.8 7.0 7.5  Total Bilirubin 0.2 - 1.2 mg/dL 0.6 0.5 0.4  Alkaline Phos 39 - 117 U/L 39 46 68  AST 0 - 37 U/L 43(H) 41(H) 24  ALT 0 - 35 U/L 44(H) 40(H) 28     CBC Latest Ref Rng & Units 02/29/2016 08/19/2015 08/27/2014  WBC 4.0 - 10.5 K/uL 6.3 6.1 5.3  Hemoglobin 12.0 - 15.0 g/dL 13.5  13.4 13.6  Hematocrit 36.0 - 46.0 % 39.0 39.7 39.6  Platelets 150.0 - 400.0 K/uL 303.0 296.0 306.0     Discussion 76 yo female with hx of asthma, and allergic rhinitis now with persistent cough.  This is most likely related to upper airway cough syndrome with post nasal drip and progression of her asthma.  If her symptoms do not improve by next follow up, will then need to do additional testing (PFT, RAST, CXR) and consider therapy for reflux.   Assessment/plan  Upper airway cough syndrome. - nasal irrigation - flonase, singulair - continue zyrtec  Persistent asthma. - add singulair - continue prn proair - defer addition   Patient Instructions  Nasal irrigation (salt water nose  spray) daily Fluticasone (flonase) one spray in each nostril daily Montelukast (singulair) 10 mg pill nightly  Follow up in 4 weeks with Dr. Halford Chessman or Nurse Practitioner    Chesley Mires, MD [place] [facility] Pager:  [phone] 07/12/2016, 11:08 AM

## 2016-07-12 NOTE — Progress Notes (Signed)
   Subjective:    Patient ID: [name], female    DOB: [birth year], 76 y.o.   MRN: [id number]  HPI    Review of Systems  Constitutional: Negative for fever and unexpected weight change.  HENT: Negative for congestion, dental problem, ear pain, nosebleeds, postnasal drip, rhinorrhea, sinus pressure, sneezing, sore throat and trouble swallowing.   Eyes: Negative for redness and itching.  Respiratory: Positive for cough. Negative for chest tightness, shortness of breath and wheezing.   Cardiovascular: Negative for palpitations and leg swelling.  Gastrointestinal: Negative for nausea and vomiting.  Genitourinary: Negative for dysuria.  Musculoskeletal: Negative for joint swelling.  Skin: Negative for rash.  Neurological: Negative for headaches.  Hematological: Does not bruise/bleed easily.  Psychiatric/Behavioral: Negative for dysphoric mood. The patient is not nervous/anxious.        Objective:   Physical Exam        Assessment & Plan:

## 2016-07-20 HISTORY — PX: BREAST SURGERY: SHX581

## 2016-07-21 DIAGNOSIS — R05 Cough: Secondary | ICD-10-CM | POA: Diagnosis not present

## 2016-07-21 DIAGNOSIS — J018 Other acute sinusitis: Secondary | ICD-10-CM | POA: Diagnosis not present

## 2016-07-24 ENCOUNTER — Ambulatory Visit (INDEPENDENT_AMBULATORY_CARE_PROVIDER_SITE_OTHER): Payer: Medicare Other | Admitting: Endocrinology

## 2016-07-24 ENCOUNTER — Encounter: Payer: Self-pay | Admitting: Endocrinology

## 2016-07-24 DIAGNOSIS — Z1231 Encounter for screening mammogram for malignant neoplasm of breast: Secondary | ICD-10-CM | POA: Diagnosis not present

## 2016-07-24 DIAGNOSIS — E119 Type 2 diabetes mellitus without complications: Secondary | ICD-10-CM

## 2016-07-24 LAB — BASIC METABOLIC PANEL
BUN: 26 mg/dL — ABNORMAL HIGH (ref 6–23)
CHLORIDE: 104 meq/L (ref 96–112)
CO2: 27 mEq/L (ref 19–32)
Calcium: 9.9 mg/dL (ref 8.4–10.5)
Creatinine, Ser: 1.07 mg/dL (ref 0.40–1.20)
GFR: 52.88 mL/min — AB (ref >60.00)
Glucose, Bld: 129 mg/dL — ABNORMAL HIGH (ref 70–99)
POTASSIUM: 4 meq/L (ref 3.5–5.1)
SODIUM: 138 meq/L (ref 135–145)

## 2016-07-24 LAB — CBC WITH DIFFERENTIAL/PLATELET
BASOS PCT: 0.9 % (ref 0.0–3.0)
Basophils Absolute: 0.1 10*3/uL (ref 0.0–0.1)
EOS ABS: 0.2 10*3/uL (ref 0.0–0.7)
EOS PCT: 1.8 % (ref 0.0–5.0)
HEMATOCRIT: 42.4 % (ref 36.0–46.0)
Hemoglobin: 14.2 g/dL (ref 12.0–15.0)
Lymphocytes Relative: 24.5 % (ref 12.0–46.0)
Lymphs Abs: 2.2 10*3/uL (ref 0.7–4.0)
MCHC: 33.5 g/dL (ref 30.0–36.0)
MCV: 88.4 fl (ref 78.0–100.0)
MONO ABS: 0.7 10*3/uL (ref 0.1–1.0)
Monocytes Relative: 7.7 % (ref 3.0–12.0)
NEUTROS ABS: 5.9 10*3/uL (ref 1.4–7.7)
Neutrophils Relative %: 65.1 % (ref 43.0–77.0)
PLATELETS: 321 10*3/uL (ref 150.0–400.0)
RBC: 4.79 Mil/uL (ref 3.87–5.11)
RDW: 13.6 % (ref 11.5–15.5)
WBC: 9.1 10*3/uL (ref 4.0–10.5)

## 2016-07-24 LAB — POCT GLYCOSYLATED HEMOGLOBIN (HGB A1C): HEMOGLOBIN A1C: 7.3

## 2016-07-24 LAB — HEPATIC FUNCTION PANEL
ALT: 32 U/L (ref 0–35)
AST: 27 U/L (ref 0–37)
Albumin: 4.6 g/dL (ref 3.5–5.2)
Alkaline Phosphatase: 41 U/L (ref 39–117)
BILIRUBIN DIRECT: 0.1 mg/dL (ref 0.0–0.3)
BILIRUBIN TOTAL: 0.4 mg/dL (ref 0.2–1.2)
Total Protein: 7.3 g/dL (ref 6.0–8.3)

## 2016-07-24 LAB — HM MAMMOGRAPHY

## 2016-07-24 MED ORDER — METFORMIN HCL ER 500 MG PO TB24: 2000.0000 mg | ORAL_TABLET | Freq: Every day | ORAL | 11 refills | Status: DC

## 2016-07-24 NOTE — Patient Instructions (Addendum)
good diet and exercise significantly improve the control of your diabetes.  please let me know if you wish to be referred to a dietician.  high blood sugar is very risky to your health.  you should see an eye doctor and dentist every year.  It is very important to get all recommended vaccinations.  Controlling your blood pressure and cholesterol drastically reduces the damage diabetes does to your body.  Those who smoke should quit.  Please discuss these with your doctor.  check your blood sugar once a day.  vary the time of day when you check, between before the 3 meals, and at bedtime.  also check if you have symptoms of your blood sugar being too high or too low.  please keep a record of the readings and bring it to your next appointment here (or you can bring the meter itself).  You can write it on any piece of paper.  please call us sooner if your blood sugar goes below 70, or if you have a lot of readings over 200.  I have sent a prescription to your pharmacy, to change victoza to metformin.   If necessary, we can add another generic medication such as "repaglinide," "bromocriptine," or "pioglitizone." Please come back for a follow-up appointment in 3 months.

## 2016-07-24 NOTE — Progress Notes (Signed)
Subjective:    Patient ID: [name], female    DOB: May 22, 1941, 76 y.o.   MRN: [id number]  HPI pt is referred by Dr Birdie Riddle, for diabetes.  Pt states DM was dx'ed in 1999; she has mild if any neuropathy of the lower extremities; she has associated renal insufficiency; she has never been on insulin; pt says her diet is good, but exercise is poor; she has never had GDM, pancreatitis, severe hypoglycemia or DKA.  She takes victoza, but wants a cheaper med.   Past Medical History:  Diagnosis Date  . Allergic rhinitis   . Asthma   . Diabetes mellitus   . Glaucoma   . Hyperlipemia   . Kidney stone   . Migraines   . Palpitations   . Urinary tract infection     Past Surgical History:  Procedure Laterality Date  . ABDOMINAL HYSTERECTOMY     Has ovaries  . BACK SURGERY  1987   lumb  . CHOLECYSTECTOMY  09/26/13  . CLOSED REDUCTION NASAL FRACTURE  04/29/2012   Procedure: CLOSED REDUCTION NASAL FRACTURE;  Surgeon: Jodi Marble, MD;  Location: [place];  Service: ENT;  Laterality: N/A;  WITH STABILIZATION  . COLONOSCOPY    . Deviated septum repair    . LITHOTRIPSY    . ROTATOR CUFF REPAIR     right  . TONSILLECTOMY      Social History   Social History  . Marital status: Married    Spouse name: N/A  . Number of children: 2  . Years of education: N/A   Occupational History  . Retired -Personal assistant Retired   Social History Main Topics  . Smoking status: Former Smoker    Packs/day: 0.10    Years: 4.00    Types: Cigarettes    Quit date: 06/19/1986  . Smokeless tobacco: Never Used     Comment: smoked in college years ago  . Alcohol use 0.0 oz/week     Comment: 1-2 glasses a night variety beer  . Drug use: No  . Sexual activity: Not on file   Other Topics Concern  . Not on file   Social History Narrative   Daily caffeine     Current Outpatient Prescriptions on File Prior to Visit  Medication Sig Dispense Refill  . albuterol (PROAIR HFA) 108 (90  Base) MCG/ACT inhaler Inhale 2 puffs into the lungs every 4 (four) hours as needed for wheezing or shortness of breath. 1 Inhaler 6  . atorvastatin (LIPITOR) 20 MG tablet Take 1 tablet (20 mg total) by mouth daily. 30 tablet 6  . Biotin 5000 MCG CAPS Take 1 capsule by mouth daily.    . cetirizine (ZYRTEC) 10 MG tablet TAKE ONE (1) TABLET EACH DAY 30 tablet 3  . fenofibrate (TRICOR) 145 MG tablet TAKE ONE (1) TABLET EACH DAY AFTER BREKFAST 30 tablet 6  . fluticasone (FLONASE) 50 MCG/ACT nasal spray Place 1 spray into both nostrils daily. 16 g 2  . Insulin Pen Needle 32G X 6 MM MISC 1 each by Does not apply route daily. To inject insulin. 50 each 12  . LUMIGAN 0.01 % SOLN     . montelukast (SINGULAIR) 10 MG tablet Take 1 tablet (10 mg total) by mouth at bedtime. 30 tablet 5  . ONE TOUCH ULTRA TEST test strip USE 1 TIME A DAY 50 each 12  . ONETOUCH DELICA LANCETS 99991111 MISC Use one lancet to test sugars once daily. E11.9 100 each  12  . acetaminophen (TYLENOL) 500 MG tablet Take 500 mg by mouth every 6 (six) hours as needed.     No current facility-administered medications on file prior to visit.     Allergies  Allergen Reactions  . Papain Anaphylaxis    Takes an extreme concentration  . Papaya Derivatives Anaphylaxis  . Penicillins Other (See Comments)    Pt unsure. had a reaction so long ago, was told by providers to not use.   . Mushroom Ext Cmplx(Shiitake-Reishi-Mait) Other (See Comments)    Head congestion and sore throat  . Venlafaxine Other (See Comments)    Pt states she can take Name brand. Lethargy    Family History  Problem Relation Age of Onset  . Diabetes Father   . Colon cancer Neg Hx   . Esophageal cancer Neg Hx   . Rectal cancer Neg Hx   . Stomach cancer Neg Hx   . Breast cancer Neg Hx     BP 136/84   Pulse 84   Ht 5\' 4"  (1.626 m)   Wt 164 lb (74.4 kg)   SpO2 95%   BMI 28.15 kg/m    Review of Systems denies weight loss, blurry vision, headache, chest pain,  sob, n/v, muscle cramps, excessive diaphoresis, depression, cold intolerance, rhinorrhea, and easy bruising.  She has dry skin and frequent urination.    Objective:   Physical Exam VS: see vs page GEN: no distress HEAD: head: no deformity eyes: no periorbital swelling, no proptosis external nose and ears are normal mouth: no lesion seen Ears: bilat hearing aids.   NECK: supple, thyroid is not enlarged.   CHEST WALL: no deformity LUNGS: clear to auscultation CV: reg rate and rhythm, no murmur ABD: abdomen is soft, nontender.  no hepatosplenomegaly.  not distended.  no hernia MUSCULOSKELETAL: muscle bulk and strength are grossly normal.  no obvious joint swelling.  gait is normal and steady EXTEMITIES: no deformity.  no ulcer on the feet.  feet are of normal color and temp.  no edema.  There is bilateral onychomycosis of the toenails.   PULSES: dorsalis pedis intact bilat.  no carotid bruit NEURO:  cn 2-12 grossly intact.   readily moves all 4's.  sensation is intact to touch on the feet SKIN:  Normal texture and temperature.  No rash or suspicious lesion is visible.   NODES:  None palpable at the neck PSYCH: alert, well-oriented.  Does not appear anxious nor depressed.  Lab Results  Component Value Date   HGBA1C 7.0 (H) 02/29/2016   I personally reviewed electrocardiogram tracing (05/16/13): Indication: sob Impression: NSR.  No MI.  No hypertrophy. Compared to 2013: no change   Lab Results  Component Value Date   HGBA1C 7.3 07/24/2016   Lab Results  Component Value Date   CREATININE 1.07 07/24/2016   BUN 26 (H) 07/24/2016   NA 138 07/24/2016   K 4.0 07/24/2016   CL 104 07/24/2016   CO2 27 07/24/2016      Assessment & Plan:  Type 2 DM, new to me: she requests cheaper med.  Frequent urination, not DM-related.  No rx needed here.  Patient is advised the following: Patient Instructions  good diet and exercise significantly improve the control of your diabetes.  please  let me know if you wish to be referred to a dietician.  high blood sugar is very risky to your health.  you should see an eye doctor and dentist every year.  It is very  important to get all recommended vaccinations.  Controlling your blood pressure and cholesterol drastically reduces the damage diabetes does to your body.  Those who smoke should quit.  Please discuss these with your doctor.  check your blood sugar once a day.  vary the time of day when you check, between before the 3 meals, and at bedtime.  also check if you have symptoms of your blood sugar being too high or too low.  please keep a record of the readings and bring it to your next appointment here (or you can bring the meter itself).  You can write it on any piece of paper.  please call us sooner if your blood sugar goes below 70, or if you have a lot of readings over 200.  I have sent a prescription to your pharmacy, to change victoza to metformin.   If necessary, we can add another generic medication such as "repaglinide," "bromocriptine," or "pioglitizone." Please come back for a follow-up appointment in 3 months.

## 2016-07-27 ENCOUNTER — Ambulatory Visit (INDEPENDENT_AMBULATORY_CARE_PROVIDER_SITE_OTHER): Payer: Medicare Other | Admitting: Family Medicine

## 2016-07-27 DIAGNOSIS — Z01818 Encounter for other preprocedural examination: Secondary | ICD-10-CM

## 2016-07-27 NOTE — Progress Notes (Signed)
EKG WNL- ok to proceed w/ surgery

## 2016-07-28 DIAGNOSIS — N6311 Unspecified lump in the right breast, upper outer quadrant: Secondary | ICD-10-CM | POA: Diagnosis not present

## 2016-07-28 DIAGNOSIS — N6321 Unspecified lump in the left breast, upper outer quadrant: Secondary | ICD-10-CM | POA: Diagnosis not present

## 2016-07-29 ENCOUNTER — Other Ambulatory Visit: Payer: Self-pay | Admitting: Family Medicine

## 2016-07-31 ENCOUNTER — Encounter: Payer: Self-pay | Admitting: General Practice

## 2016-08-01 ENCOUNTER — Encounter: Payer: Self-pay | Admitting: Family Medicine

## 2016-08-01 ENCOUNTER — Other Ambulatory Visit: Payer: Self-pay | Admitting: Radiology

## 2016-08-01 DIAGNOSIS — N6012 Diffuse cystic mastopathy of left breast: Secondary | ICD-10-CM | POA: Diagnosis not present

## 2016-08-01 DIAGNOSIS — N6092 Unspecified benign mammary dysplasia of left breast: Secondary | ICD-10-CM | POA: Diagnosis not present

## 2016-08-10 ENCOUNTER — Encounter: Payer: Self-pay | Admitting: Adult Health

## 2016-08-10 ENCOUNTER — Ambulatory Visit (INDEPENDENT_AMBULATORY_CARE_PROVIDER_SITE_OTHER): Payer: Medicare Other | Admitting: Adult Health

## 2016-08-10 ENCOUNTER — Ambulatory Visit (INDEPENDENT_AMBULATORY_CARE_PROVIDER_SITE_OTHER)
Admission: RE | Admit: 2016-08-10 | Discharge: 2016-08-10 | Disposition: A | Discharge status: Home / Self Care | Payer: Medicare Other | Source: Ambulatory Visit | Attending: Adult Health | Admitting: Adult Health

## 2016-08-10 VITALS — BP 126/76 | HR 89 | Ht 66.0 in | Wt 170.2 lb

## 2016-08-10 DIAGNOSIS — R05 Cough: Secondary | ICD-10-CM

## 2016-08-10 DIAGNOSIS — Z0389 Encounter for observation for other suspected diseases and conditions ruled out: Secondary | ICD-10-CM | POA: Diagnosis not present

## 2016-08-10 DIAGNOSIS — R059 Cough, unspecified: Secondary | ICD-10-CM

## 2016-08-10 DIAGNOSIS — J4540 Moderate persistent asthma, uncomplicated: Secondary | ICD-10-CM | POA: Diagnosis not present

## 2016-08-10 DIAGNOSIS — J4531 Mild persistent asthma with (acute) exacerbation: Secondary | ICD-10-CM

## 2016-08-10 NOTE — Patient Instructions (Addendum)
Begin Delsym 2 tsp Twice daily  As needed  Cough  Continue on Singulair daily .  Continue on Flonase daily  Continue on Zyrtec 10mg  At bedtime   Continue on Saline nasal rinses  Chest xray today  Labs today .  Follow up Dr. Halford Chessman  In 2 months with PFT and As needed   Please contact office for sooner follow up if symptoms do not improve or worsen or seek emergency care

## 2016-08-10 NOTE — Progress Notes (Signed)
I have reviewed and agree with assessment/plan.  Chesley Mires, MD [facility] Pulmonary/Critical Care 08/10/2016, 7:09 PM Pager:  [phone]

## 2016-08-10 NOTE — Assessment & Plan Note (Signed)
Flare with chronic cough ? Etiology  Minimal improvement with trigger prevention  Check labs with IgE /Rast/CBC and CXR  Needs PFT on return   Plan  Patient Instructions  Begin Delsym 2 tsp Twice daily  As needed  Cough  Continue on Singulair daily .  Continue on Flonase daily  Continue on Zyrtec 10mg  At bedtime   Continue on Saline nasal rinses  Chest xray today  Labs today .  Follow up Dr. Halford Chessman  In 2 months with PFT and As needed   Please contact office for sooner follow up if symptoms do not improve or worsen or seek emergency care

## 2016-08-10 NOTE — Progress Notes (Signed)
@Patient  ID: [name], female    DOB: [birth year], 76 y.o.   MRN: [id number]  Chief Complaint  Patient presents with  . Follow-up    Asthma     Referring provider: Midge Minium, MD  HPI: 76 yo female former  smoker followed for asthma and AR . Hx of nasal polyps.   TEST  Pulmonary tests Spirometry 04/12/10 >> FEV1 1.92 (100%), FEV1% 86 Spirometry 08/27/13 >> FEV1 1.83 (88%), FEV1% 82 FeNO 07/12/16 >> 11  Cardiac tests Echo 01/20/11 > EF 55 to 60%, grade 1 DD  08/10/2016 Follow up: Asthma/AR/ Cough  Patient  returns for a one-month follow-up. Patient has a known history of asthma, allergic rhinitis and was seen in the office last visit for increased cough. She was recommended to use Flonase, Singulair and Zyrtec. She was also started on nasal saline flushes. She returns today feeling some better but still has cough , congestion and drainage.  Cough is worse at night . Unable to identify triggers.  Says in last 3 weeks she did have flu and sinus infection. She was treated with Tamiflu and ABX .  Cough is dry and not productive. Not using any cough meds.      Allergies  Allergen Reactions  . Papain Anaphylaxis    Takes an extreme concentration  . Papaya Derivatives Anaphylaxis  . Penicillins Other (See Comments)    Pt unsure. had a reaction so long ago, was told by providers to not use.   . Mushroom Ext Cmplx(Shiitake-Reishi-Mait) Other (See Comments)    Head congestion and sore throat  . Venlafaxine Other (See Comments)    Pt states she can take Name brand. Lethargy    Immunization History  Administered Date(s) Administered  . Influenza Split 04/11/2011  . Influenza,inj,Quad PF,36+ Mos 04/10/2013, 03/10/2014, 04/20/2015, 02/29/2016  . Pneumococcal Conjugate-13 08/27/2014  . Pneumococcal Polysaccharide-23 04/19/2010  . Tdap 04/20/2012    Past Medical History:  Diagnosis Date  . Allergic rhinitis   . Asthma   . Diabetes mellitus   . Glaucoma   .  Hyperlipemia   . Kidney stone   . Migraines   . Palpitations   . Urinary tract infection     Tobacco History: History  Smoking Status  . Former Smoker  . Packs/day: 0.10  . Years: 4.00  . Types: Cigarettes  . Quit date: 06/19/1986  Smokeless Tobacco  . Never Used    Comment: smoked in college years ago   Counseling given: Not Answered   Outpatient Encounter Prescriptions as of 08/10/2016  Medication Sig  . acetaminophen (TYLENOL) 500 MG tablet Take 500 mg by mouth at bedtime.   Marland Kitchen albuterol (PROAIR HFA) 108 (90 Base) MCG/ACT inhaler Inhale 2 puffs into the lungs every 4 (four) hours as needed for wheezing or shortness of breath.  Marland Kitchen atorvastatin (LIPITOR) 20 MG tablet Take 1 tablet (20 mg total) by mouth daily.  . Biotin 5000 MCG CAPS Take 1 capsule by mouth daily.  . cetirizine (ZYRTEC) 10 MG tablet TAKE ONE (1) TABLET EACH DAY  . citalopram (CELEXA) 40 MG tablet TAKE ONE (1) TABLET EACH DAY  . fenofibrate (TRICOR) 145 MG tablet TAKE ONE (1) TABLET EACH DAY AFTER BREKFAST  . fluticasone (FLONASE) 50 MCG/ACT nasal spray Place 1 spray into both nostrils daily.  Marland Kitchen LUMIGAN 0.01 % SOLN   . metFORMIN (GLUCOPHAGE-XR) 500 MG 24 hr tablet Take 4 tablets (2,000 mg total) by mouth daily with breakfast.  . montelukast (SINGULAIR)  10 MG tablet Take 1 tablet (10 mg total) by mouth at bedtime.  . ONE TOUCH ULTRA TEST test strip USE 1 TIME A DAY  . ONETOUCH DELICA LANCETS 99991111 MISC Use one lancet to test sugars once daily. E11.9   No facility-administered encounter medications on file as of 08/10/2016.      Review of Systems  Constitutional:   No  weight loss, night sweats,  Fevers, chills,  +fatigue, or  lassitude.  HEENT:   No headaches,  Difficulty swallowing,  Tooth/dental problems, or  Sore throat,                No sneezing, itching, ear ache, + nasal congestion, post nasal drip,   CV:  No chest pain,  Orthopnea, PND, swelling in lower extremities, anasarca, dizziness,  palpitations, syncope.   GI  No heartburn, indigestion, abdominal pain, nausea, vomiting, diarrhea, change in bowel habits, loss of appetite, bloody stools.   Resp:   No chest wall deformity  Skin: no rash or lesions.  GU: no dysuria, change in color of urine, no urgency or frequency.  No flank pain, no hematuria   MS:  No joint pain or swelling.  No decreased range of motion.  No back pain.    Physical Exam  BP 126/76 (BP Location: Left Arm, Cuff Size: Normal)   Pulse 89   Ht 5\' 6"  (1.676 m)   Wt 170 lb 3.2 oz (77.2 kg)   SpO2 95%   BMI 27.47 kg/m   GEN: A/Ox3; pleasant , NAD, well nourished    HEENT:  [place]/AT,  EACs-clear, TMs-wnl, NOSE-clear, THROAT-clear, no lesions, no postnasal drip or exudate noted.   NECK:  Supple w/ fair ROM; no JVD; normal carotid impulses w/o bruits; no thyromegaly or nodules palpated; no lymphadenopathy.    RESP  Clear  P & A; w/o, wheezes/ rales/ or rhonchi. no accessory muscle use, no dullness to percussion  CARD:  RRR, no m/r/g, no peripheral edema, pulses intact, no cyanosis or clubbing.  GI:   Soft & nt; nml bowel sounds; no organomegaly or masses detected.   Musco: Warm bil, no deformities or joint swelling noted.   Neuro: alert, no focal deficits noted.    Skin: Warm, no lesions or rashes    Lab Results:  CBC    Component Value Date/Time   WBC 9.1 07/24/2016 1022   RBC 4.79 07/24/2016 1022   HGB 14.2 07/24/2016 1022   HCT 42.4 07/24/2016 1022   PLT 321.0 07/24/2016 1022   MCV 88.4 07/24/2016 1022   MCH 30.1 09/10/2013 1633   MCHC 33.5 07/24/2016 1022   RDW 13.6 07/24/2016 1022   LYMPHSABS 2.2 07/24/2016 1022   MONOABS 0.7 07/24/2016 1022   EOSABS 0.2 07/24/2016 1022   BASOSABS 0.1 07/24/2016 1022    BMET    Component Value Date/Time   NA 138 07/24/2016 1022   K 4.0 07/24/2016 1022   CL 104 07/24/2016 1022   CO2 27 07/24/2016 1022   GLUCOSE 129 (H) 07/24/2016 1022   BUN 26 (H) 07/24/2016 1022   CREATININE 1.07  07/24/2016 1022   CALCIUM 9.9 07/24/2016 1022   GFRNONAA 62 (L) 09/10/2013 1633   GFRAA 72 (L) 09/10/2013 1633    BNP No results found for: BNP  ProBNP    Component Value Date/Time   PROBNP 152.3 (H) 05/16/2013 2034    Imaging: Dg Chest 2 View  Result Date: 08/10/2016 CLINICAL DATA:  76 year old female preoperative study for shoulder  surgery. Initial encounter. EXAM: CHEST  2 VIEW COMPARISON:  08/27/2013 and earlier. FINDINGS: Stable lung volumes. Mediastinal contours are stable and within normal limits. Visualized tracheal air column is within normal limits. No pneumothorax, pulmonary edema, pleural effusion or confluent pulmonary opacity. Right upper quadrant cholecystectomy clips. Negative visible bowel gas pattern. No acute osseous abnormality identified. IMPRESSION: No acute cardiopulmonary abnormality. Electronically Signed   By: Genevie Ann M.D.   On: 08/10/2016 15:20     Assessment & Plan:   Asthma Flare with chronic cough ? Etiology  Minimal improvement with trigger prevention  Check labs with IgE /Rast/CBC and CXR  Needs PFT on return   Plan  Patient Instructions  Begin Delsym 2 tsp Twice daily  As needed  Cough  Continue on Singulair daily .  Continue on Flonase daily  Continue on Zyrtec 10mg  At bedtime   Continue on Saline nasal rinses  Chest xray today  Labs today .  Follow up Dr. Halford Chessman  In 2 months with PFT and As needed   Please contact office for sooner follow up if symptoms do not improve or worsen or seek emergency care      Cough Upper airway cough  ? AR/GERD triggers Check cxr and labs.   Plan  . Patient Instructions  Begin Delsym 2 tsp Twice daily  As needed  Cough  Continue on Singulair daily .  Continue on Flonase daily  Continue on Zyrtec 10mg  At bedtime   Continue on Saline nasal rinses  Chest xray today  Labs today .  Follow up Dr. Halford Chessman  In 2 months with PFT and As needed   Please contact office for sooner follow up if symptoms do not  improve or worsen or seek emergency care         Rexene Edison, NP 08/10/2016

## 2016-08-10 NOTE — Assessment & Plan Note (Signed)
Upper airway cough  ? AR/GERD triggers Check cxr and labs.   Plan  . Patient Instructions  Begin Delsym 2 tsp Twice daily  As needed  Cough  Continue on Singulair daily .  Continue on Flonase daily  Continue on Zyrtec 10mg  At bedtime   Continue on Saline nasal rinses  Chest xray today  Labs today .  Follow up Dr. Halford Chessman  In 2 months with PFT and As needed   Please contact office for sooner follow up if symptoms do not improve or worsen or seek emergency care

## 2016-08-11 NOTE — Progress Notes (Signed)
Called spoke with patient, advised of cxr results / recs as stated by TP.  Pt verbalized her understanding and denied any questions. 

## 2016-08-30 ENCOUNTER — Other Ambulatory Visit: Payer: Self-pay | Admitting: Family Medicine

## 2016-08-30 NOTE — H&P (Signed)
[name] is an 76 y.o. female.    Chief Complaint: right shoulder pain and weakness   HPI: Pt is a 76 y.o. female complaining of right shoulder pain for multiple years. Pain had continually increased since the beginning. X-rays in the clinic show end-stage arthritic changes of the right shoulder . Pt has tried various conservative treatments which have failed to alleviate their symptoms, including injections and therapy. Various options are discussed with the patient. Risks, benefits and expectations were discussed with the patient. Patient understand the risks, benefits and expectations and wishes to proceed with surgery.   PCP:  Annye Asa, MD  D/C Plans: Home  PMH: Past Medical History:  Diagnosis Date  . Allergic rhinitis   . Asthma   . Diabetes mellitus   . Glaucoma   . Hyperlipemia   . Kidney stone   . Migraines   . Palpitations   . Urinary tract infection     PSH: Past Surgical History:  Procedure Laterality Date  . ABDOMINAL HYSTERECTOMY     Has ovaries  . BACK SURGERY  1987   lumb  . CHOLECYSTECTOMY  09/26/13  . CLOSED REDUCTION NASAL FRACTURE  04/29/2012   Procedure: CLOSED REDUCTION NASAL FRACTURE;  Surgeon: Jodi Marble, MD;  Location: [place];  Service: ENT;  Laterality: N/A;  WITH STABILIZATION  . COLONOSCOPY    . Deviated septum repair    . LITHOTRIPSY    . ROTATOR CUFF REPAIR     right  . TONSILLECTOMY      Social History:  reports that she quit smoking about 30 years ago. Her smoking use included Cigarettes. She has a 0.40 pack-year smoking history. She has never used smokeless tobacco. She reports that she drinks alcohol. She reports that she does not use drugs.  Allergies:  Allergies  Allergen Reactions  . Papain Anaphylaxis    Takes an extreme concentration  . Papaya Derivatives Anaphylaxis  . Penicillins Other (See Comments)    Pt unsure. had a reaction so long ago, was told by providers to not use.   . Mushroom  Ext Cmplx(Shiitake-Reishi-Mait) Other (See Comments)    Head congestion and sore throat  . Venlafaxine Other (See Comments)    Pt states she can take Name brand. Lethargy    Medications: No current facility-administered medications for this encounter.    Current Outpatient Prescriptions  Medication Sig Dispense Refill  . acetaminophen (TYLENOL) 500 MG tablet Take 500 mg by mouth at bedtime.     Marland Kitchen albuterol (PROAIR HFA) 108 (90 Base) MCG/ACT inhaler Inhale 2 puffs into the lungs every 4 (four) hours as needed for wheezing or shortness of breath. 1 Inhaler 6  . atorvastatin (LIPITOR) 20 MG tablet Take 1 tablet (20 mg total) by mouth daily. 30 tablet 6  . Biotin 5000 MCG CAPS Take 1 capsule by mouth daily.    . cetirizine (ZYRTEC) 10 MG tablet TAKE ONE (1) TABLET EACH DAY 30 tablet 3  . citalopram (CELEXA) 40 MG tablet TAKE ONE (1) TABLET EACH DAY 30 tablet 6  . fenofibrate (TRICOR) 145 MG tablet TAKE ONE (1) TABLET EACH DAY AFTER BREKFAST 30 tablet 6  . fluticasone (FLONASE) 50 MCG/ACT nasal spray Place 1 spray into both nostrils daily. 16 g 2  . LUMIGAN 0.01 % SOLN     . metFORMIN (GLUCOPHAGE-XR) 500 MG 24 hr tablet Take 4 tablets (2,000 mg total) by mouth daily with breakfast. 120 tablet 11  . montelukast (SINGULAIR) 10  MG tablet Take 1 tablet (10 mg total) by mouth at bedtime. 30 tablet 5  . ONE TOUCH ULTRA TEST test strip USE 1 TIME A DAY 50 each 12  . ONETOUCH DELICA LANCETS 41J MISC Use one lancet to test sugars once daily. E11.9 100 each 12    No results found for this or any previous visit (from the past 48 hour(s)). No results found.  ROS: Pain with rom of the right upper extremity  Physical Exam:  Alert and oriented 76 y.o. female in no acute distress Cranial nerves 2-12 intact Cervical spine: full rom with no tenderness, nv intact distally Chest: active breath sounds bilaterally, no wheeze rhonchi or rales Heart: regular rate and rhythm, no murmur Abd: non tender non  distended with active bowel sounds Hip is stable with rom  Right shoulder limited rom and weakness nv intact distally No rashes or edema  Assessment/Plan Assessment: right shoulder cuff arthropathy  Plan: Patient will undergo a right reverse total shoulder by Dr. Veverly Fells at [facility]. Risks benefits and expectations were discussed with the patient. Patient understand risks, benefits and expectations and wishes to proceed.

## 2016-09-08 ENCOUNTER — Encounter (HOSPITAL_COMMUNITY): Payer: Self-pay

## 2016-09-08 ENCOUNTER — Encounter (HOSPITAL_COMMUNITY)
Admission: RE | Admit: 2016-09-08 | Discharge: 2016-09-08 | Disposition: A | Discharge status: Home / Self Care | Payer: Medicare Other | Source: Ambulatory Visit | Attending: Orthopedic Surgery | Admitting: Orthopedic Surgery

## 2016-09-08 DIAGNOSIS — E119 Type 2 diabetes mellitus without complications: Secondary | ICD-10-CM | POA: Insufficient documentation

## 2016-09-08 DIAGNOSIS — E785 Hyperlipidemia, unspecified: Secondary | ICD-10-CM | POA: Insufficient documentation

## 2016-09-08 DIAGNOSIS — Z01812 Encounter for preprocedural laboratory examination: Secondary | ICD-10-CM | POA: Diagnosis not present

## 2016-09-08 DIAGNOSIS — G43909 Migraine, unspecified, not intractable, without status migrainosus: Secondary | ICD-10-CM | POA: Diagnosis not present

## 2016-09-08 HISTORY — DX: Cardiac arrhythmia, unspecified: I49.9

## 2016-09-08 HISTORY — DX: Personal history of urinary calculi: Z87.442

## 2016-09-08 HISTORY — DX: Pneumonia, unspecified organism: J18.9

## 2016-09-08 LAB — CBC
HCT: 41.5 % (ref 36.0–46.0)
Hemoglobin: 13.7 g/dL (ref 12.0–15.0)
MCH: 28.9 pg (ref 26.0–34.0)
MCHC: 33 g/dL (ref 30.0–36.0)
MCV: 87.6 fL (ref 78.0–100.0)
PLATELETS: 283 10*3/uL (ref 150–400)
RBC: 4.74 MIL/uL (ref 3.87–5.11)
RDW: 13.5 % (ref 11.5–15.5)
WBC: 7.1 10*3/uL (ref 4.0–10.5)

## 2016-09-08 LAB — SURGICAL PCR SCREEN
MRSA, PCR: NEGATIVE
STAPHYLOCOCCUS AUREUS: NEGATIVE

## 2016-09-08 LAB — BASIC METABOLIC PANEL
Anion gap: 10 (ref 5–15)
BUN: 14 mg/dL (ref 6–20)
CALCIUM: 10.1 mg/dL (ref 8.9–10.3)
CO2: 23 mmol/L (ref 22–32)
CREATININE: 0.88 mg/dL (ref 0.44–1.00)
Chloride: 108 mmol/L (ref 101–111)
GFR calc non Af Amer: >60 mL/min (ref >60)
Glucose, Bld: 148 mg/dL — ABNORMAL HIGH (ref 65–99)
Potassium: 4 mmol/L (ref 3.5–5.1)
Sodium: 141 mmol/L (ref 135–145)

## 2016-09-08 LAB — GLUCOSE, CAPILLARY: Glucose-Capillary: 190 mg/dL — ABNORMAL HIGH (ref 65–99)

## 2016-09-08 NOTE — Pre-Procedure Instructions (Addendum)
Nariya Neumeyer Raczka  09/08/2016      [facility] - [place], [place] - [address] [place] [address] Phone: [phone] Fax: [phone]    Your procedure is scheduled on 09/15/16  Report to [facility] Admitting at  830 A.M.  Call this number if you have problems the morning of surgery:  [phone]   Remember:  Do not eat food or drink liquids after midnight.  Take these medicines the morning of surgery with A SIP OF WATER    Inhaler if needed(bring), citalopram(celexa)  STOP all herbel meds, nsaids (aleve,naproxen,advil,ibuprofen) 7 days prior to surgery starting today 09/08/16 including all vitamins/supplements, aspirin, biotin  No metformin am of surgery   Do not wear jewelry, make-up or nail polish.  Do not wear lotions, powders, or perfumes, or deoderant.  Do not shave 48 hours prior to surgery.  Men may shave face and neck.  Do not bring valuables to the hospital.  [facility] is not responsible for any belongings or valuables.  Contacts, dentures or bridgework may not be worn into surgery.  Leave your suitcase in the car.  After surgery it may be brought to your room.  For patients admitted to the hospital, discharge time will be determined by your treatment team.  Patients discharged the day of surgery will not be allowed to drive home.   Special instructions:   Special Instructions: [place] - Preparing for Surgery  Before surgery, you can play an important role.  Because skin is not sterile, your skin needs to be as free of germs as possible.  You can reduce the number of germs on you skin by washing with CHG (chlorahexidine gluconate) soap before surgery.  CHG is an antiseptic cleaner which kills germs and bonds with the skin to continue killing germs even after washing.  Please DO NOT use if you have an allergy to CHG or antibacterial soaps.  If your skin becomes reddened/irritated stop using the CHG and inform your  nurse when you arrive at Short Stay.  Do not shave (including legs and underarms) for at least 48 hours prior to the first CHG shower.  You may shave your face.  Please follow these instructions carefully:   1.  Shower with CHG Soap the night before surgery and the morning of Surgery.  2.  If you choose to wash your hair, wash your hair first as usual with your normal shampoo.  3.  After you shampoo, rinse your hair and body thoroughly to remove the Shampoo.  4.  Use CHG as you would any other liquid soap.  You can apply chg directly  to the skin and wash gently with scrungie or a clean washcloth.  5.  Apply the CHG Soap to your body ONLY FROM THE NECK DOWN.  Do not use on open wounds or open sores.  Avoid contact with your eyes ears, mouth and genitals (private parts).  Wash genitals (private parts)       with your normal soap.  6.  Wash thoroughly, paying special attention to the area where your surgery will be performed.  7.  Thoroughly rinse your body with warm water from the neck down.  8.  DO NOT shower/wash with your normal soap after using and rinsing off the CHG Soap.  9.  Pat yourself dry with a clean towel.            10.  Wear clean pajamas.  11.  Place clean sheets on your bed the night of your first shower and do not sleep with pets.  Day of Surgery  Do not apply any lotions/deodorants the morning of surgery.  Please wear clean clothes to the hospital/surgery center.  Please read over the  fact sheets that you were given.

## 2016-09-09 LAB — HEMOGLOBIN A1C
Hgb A1c MFr Bld: 7.3 % — ABNORMAL HIGH (ref 4.8–5.6)
Mean Plasma Glucose: 163 mg/dL

## 2016-09-15 ENCOUNTER — Inpatient Hospital Stay (HOSPITAL_COMMUNITY): Payer: Medicare Other | Admitting: Anesthesiology

## 2016-09-15 ENCOUNTER — Encounter (HOSPITAL_COMMUNITY)
Admission: RE | Disposition: A | Discharge status: Skilled Nursing Facility | Payer: Self-pay | Source: Ambulatory Visit | Attending: Orthopedic Surgery

## 2016-09-15 ENCOUNTER — Inpatient Hospital Stay (HOSPITAL_COMMUNITY): Payer: Medicare Other

## 2016-09-15 ENCOUNTER — Inpatient Hospital Stay (HOSPITAL_COMMUNITY)
Admission: RE | Admit: 2016-09-15 | Discharge: 2016-09-18 | DRG: 483 | Disposition: A | Discharge status: Skilled Nursing Facility | Payer: Medicare Other | Source: Ambulatory Visit | Attending: Orthopedic Surgery | Admitting: Orthopedic Surgery

## 2016-09-15 DIAGNOSIS — M19011 Primary osteoarthritis, right shoulder: Secondary | ICD-10-CM | POA: Diagnosis not present

## 2016-09-15 DIAGNOSIS — Z7984 Long term (current) use of oral hypoglycemic drugs: Secondary | ICD-10-CM

## 2016-09-15 DIAGNOSIS — Z87891 Personal history of nicotine dependence: Secondary | ICD-10-CM

## 2016-09-15 DIAGNOSIS — E785 Hyperlipidemia, unspecified: Secondary | ICD-10-CM | POA: Diagnosis present

## 2016-09-15 DIAGNOSIS — G8918 Other acute postprocedural pain: Secondary | ICD-10-CM | POA: Diagnosis not present

## 2016-09-15 DIAGNOSIS — Z79899 Other long term (current) drug therapy: Secondary | ICD-10-CM

## 2016-09-15 DIAGNOSIS — M75101 Unspecified rotator cuff tear or rupture of right shoulder, not specified as traumatic: Secondary | ICD-10-CM | POA: Diagnosis not present

## 2016-09-15 DIAGNOSIS — Z471 Aftercare following joint replacement surgery: Secondary | ICD-10-CM | POA: Diagnosis not present

## 2016-09-15 DIAGNOSIS — R2681 Unsteadiness on feet: Secondary | ICD-10-CM | POA: Diagnosis not present

## 2016-09-15 DIAGNOSIS — Z96611 Presence of right artificial shoulder joint: Secondary | ICD-10-CM

## 2016-09-15 DIAGNOSIS — E119 Type 2 diabetes mellitus without complications: Secondary | ICD-10-CM | POA: Diagnosis present

## 2016-09-15 DIAGNOSIS — M13811 Other specified arthritis, right shoulder: Principal | ICD-10-CM | POA: Diagnosis present

## 2016-09-15 DIAGNOSIS — J45909 Unspecified asthma, uncomplicated: Secondary | ICD-10-CM | POA: Diagnosis present

## 2016-09-15 DIAGNOSIS — J309 Allergic rhinitis, unspecified: Secondary | ICD-10-CM | POA: Diagnosis present

## 2016-09-15 DIAGNOSIS — Z7951 Long term (current) use of inhaled steroids: Secondary | ICD-10-CM | POA: Diagnosis not present

## 2016-09-15 DIAGNOSIS — R278 Other lack of coordination: Secondary | ICD-10-CM | POA: Diagnosis not present

## 2016-09-15 DIAGNOSIS — M6281 Muscle weakness (generalized): Secondary | ICD-10-CM | POA: Diagnosis not present

## 2016-09-15 DIAGNOSIS — M25511 Pain in right shoulder: Secondary | ICD-10-CM | POA: Diagnosis not present

## 2016-09-15 DIAGNOSIS — H409 Unspecified glaucoma: Secondary | ICD-10-CM | POA: Diagnosis not present

## 2016-09-15 DIAGNOSIS — R531 Weakness: Secondary | ICD-10-CM | POA: Diagnosis not present

## 2016-09-15 DIAGNOSIS — M25519 Pain in unspecified shoulder: Secondary | ICD-10-CM | POA: Diagnosis not present

## 2016-09-15 HISTORY — PX: REVERSE SHOULDER ARTHROPLASTY: SHX5054

## 2016-09-15 LAB — GLUCOSE, CAPILLARY
GLUCOSE-CAPILLARY: 181 mg/dL — AB (ref 65–99)
GLUCOSE-CAPILLARY: 165 mg/dL — AB (ref 65–99)
Glucose-Capillary: 226 mg/dL — ABNORMAL HIGH (ref 65–99)
Glucose-Capillary: 288 mg/dL — ABNORMAL HIGH (ref 65–99)

## 2016-09-15 SURGERY — ARTHROPLASTY, SHOULDER, TOTAL, REVERSE
Anesthesia: Regional | Site: Shoulder | Laterality: Right

## 2016-09-15 MED ORDER — GLYCOPYRROLATE 0.2 MG/ML IJ SOLN
INTRAMUSCULAR | Status: DC | PRN
  Administered 2016-09-15: 0.2 mg via INTRAVENOUS

## 2016-09-15 MED ORDER — ONDANSETRON HCL 4 MG PO TABS: 4.0000 mg | ORAL_TABLET | Freq: Four times a day (QID) | ORAL | Status: DC | PRN

## 2016-09-15 MED ORDER — BUPIVACAINE-EPINEPHRINE 0.25% -1:200000 IJ SOLN
INTRAMUSCULAR | Status: DC | PRN
  Administered 2016-09-15: 9 mL

## 2016-09-15 MED ORDER — SODIUM CHLORIDE 0.9 % IV SOLN
INTRAVENOUS | Status: DC
  Administered 2016-09-15: 17:00:00 via INTRAVENOUS

## 2016-09-15 MED ORDER — INSULIN ASPART 100 UNIT/ML ~~LOC~~ SOLN
0.0000 [IU] | Freq: Three times a day (TID) | SUBCUTANEOUS | Status: DC
  Administered 2016-09-15: 5 [IU] via SUBCUTANEOUS
  Administered 2016-09-16: 3 [IU] via SUBCUTANEOUS
  Administered 2016-09-16 – 2016-09-17 (×2): 5 [IU] via SUBCUTANEOUS
  Administered 2016-09-17: 2 [IU] via SUBCUTANEOUS
  Administered 2016-09-17: 7 [IU] via SUBCUTANEOUS

## 2016-09-15 MED ORDER — DOCUSATE SODIUM 100 MG PO CAPS
100.0000 mg | ORAL_CAPSULE | Freq: Two times a day (BID) | ORAL | Status: DC
  Administered 2016-09-15 – 2016-09-18 (×5): 100 mg via ORAL
  Filled 2016-09-15 (×6): qty 1

## 2016-09-15 MED ORDER — PHENOL 1.4 % MT LIQD: 1.0000 | OROMUCOSAL | Status: DC | PRN

## 2016-09-15 MED ORDER — CLINDAMYCIN PHOSPHATE 600 MG/50ML IV SOLN
INTRAVENOUS | Status: AC
  Filled 2016-09-15: qty 50

## 2016-09-15 MED ORDER — PROPOFOL 10 MG/ML IV BOLUS
INTRAVENOUS | Status: AC
  Filled 2016-09-15: qty 20

## 2016-09-15 MED ORDER — FENOFIBRATE 160 MG PO TABS
160.0000 mg | ORAL_TABLET | Freq: Every day | ORAL | Status: DC
  Administered 2016-09-15 – 2016-09-18 (×4): 160 mg via ORAL
  Filled 2016-09-15 (×4): qty 1

## 2016-09-15 MED ORDER — LATANOPROST 0.005 % OP SOLN
1.0000 [drp] | Freq: Every day | OPHTHALMIC | Status: DC
  Administered 2016-09-15 – 2016-09-17 (×3): 1 [drp] via OPHTHALMIC
  Filled 2016-09-15: qty 2.5

## 2016-09-15 MED ORDER — METFORMIN HCL ER 500 MG PO TB24
2000.0000 mg | ORAL_TABLET | Freq: Every day | ORAL | Status: DC
  Administered 2016-09-16 – 2016-09-18 (×3): 2000 mg via ORAL
  Filled 2016-09-15 (×3): qty 4

## 2016-09-15 MED ORDER — PROMETHAZINE HCL 25 MG/ML IJ SOLN
6.2500 mg | INTRAMUSCULAR | Status: DC | PRN
Start: 2016-09-15 — End: 2016-09-15

## 2016-09-15 MED ORDER — CLINDAMYCIN PHOSPHATE 600 MG/50ML IV SOLN
600.0000 mg | Freq: Four times a day (QID) | INTRAVENOUS | Status: AC
  Administered 2016-09-15 – 2016-09-16 (×3): 600 mg via INTRAVENOUS
  Filled 2016-09-15 (×4): qty 50

## 2016-09-15 MED ORDER — FENTANYL CITRATE (PF) 250 MCG/5ML IJ SOLN
INTRAMUSCULAR | Status: DC | PRN
  Administered 2016-09-15 (×2): 50 ug via INTRAVENOUS

## 2016-09-15 MED ORDER — ONDANSETRON HCL 4 MG/2ML IJ SOLN: 4.0000 mg | Freq: Four times a day (QID) | INTRAMUSCULAR | Status: DC | PRN

## 2016-09-15 MED ORDER — HYDROCODONE-ACETAMINOPHEN 5-325 MG PO TABS: 1.0000 | ORAL_TABLET | ORAL | Status: DC | PRN

## 2016-09-15 MED ORDER — 0.9 % SODIUM CHLORIDE (POUR BTL) OPTIME
TOPICAL | Status: DC | PRN
  Administered 2016-09-15: 1000 mL

## 2016-09-15 MED ORDER — PROPOFOL 10 MG/ML IV BOLUS
INTRAVENOUS | Status: DC | PRN
  Administered 2016-09-15: 150 mg via INTRAVENOUS

## 2016-09-15 MED ORDER — POLYETHYLENE GLYCOL 3350 17 G PO PACK: 17.0000 g | PACK | Freq: Every day | ORAL | Status: DC | PRN

## 2016-09-15 MED ORDER — ROPIVACAINE HCL 5 MG/ML IJ SOLN
INTRAMUSCULAR | Status: DC | PRN
  Administered 2016-09-15: 30 mL via PERINEURAL

## 2016-09-15 MED ORDER — CITALOPRAM HYDROBROMIDE 20 MG PO TABS
20.0000 mg | ORAL_TABLET | Freq: Every day | ORAL | Status: DC
Start: 2016-09-16 — End: 2016-09-18
  Administered 2016-09-16 – 2016-09-18 (×3): 20 mg via ORAL
  Filled 2016-09-15 (×3): qty 1

## 2016-09-15 MED ORDER — CLINDAMYCIN PHOSPHATE 900 MG/50ML IV SOLN
INTRAVENOUS | Status: AC
  Filled 2016-09-15: qty 50

## 2016-09-15 MED ORDER — ALBUTEROL SULFATE (2.5 MG/3ML) 0.083% IN NEBU: 3.0000 mL | INHALATION_SOLUTION | RESPIRATORY_TRACT | Status: DC | PRN

## 2016-09-15 MED ORDER — DEXAMETHASONE SODIUM PHOSPHATE 10 MG/ML IJ SOLN
INTRAMUSCULAR | Status: DC | PRN
  Administered 2016-09-15: 10 mg via INTRAVENOUS

## 2016-09-15 MED ORDER — MEPERIDINE HCL 25 MG/ML IJ SOLN: 6.2500 mg | INTRAMUSCULAR | Status: DC | PRN

## 2016-09-15 MED ORDER — LACTATED RINGERS IV SOLN
INTRAVENOUS | Status: DC
  Administered 2016-09-15 (×2): via INTRAVENOUS

## 2016-09-15 MED ORDER — HYDROCODONE-ACETAMINOPHEN 5-325 MG PO TABS: 1.0000 | ORAL_TABLET | ORAL | 0 refills | Status: DC | PRN

## 2016-09-15 MED ORDER — ACETAMINOPHEN 650 MG RE SUPP: 650.0000 mg | Freq: Four times a day (QID) | RECTAL | Status: DC | PRN

## 2016-09-15 MED ORDER — STERILE WATER FOR IRRIGATION IR SOLN
Status: DC | PRN
  Administered 2016-09-15: 1000 mL

## 2016-09-15 MED ORDER — DIPHENHYDRAMINE HCL 25 MG PO TABS
25.0000 mg | ORAL_TABLET | Freq: Four times a day (QID) | ORAL | Status: DC | PRN
  Filled 2016-09-15: qty 1

## 2016-09-15 MED ORDER — MENTHOL 3 MG MT LOZG
1.0000 | LOZENGE | OROMUCOSAL | Status: DC | PRN
  Administered 2016-09-15: 3 mg via ORAL
  Filled 2016-09-15: qty 9

## 2016-09-15 MED ORDER — HYDROMORPHONE HCL 1 MG/ML IJ SOLN: 0.2500 mg | INTRAMUSCULAR | Status: DC | PRN

## 2016-09-15 MED ORDER — ACETAMINOPHEN 325 MG PO TABS: 650.0000 mg | ORAL_TABLET | Freq: Four times a day (QID) | ORAL | Status: DC | PRN

## 2016-09-15 MED ORDER — LIDOCAINE HCL (CARDIAC) 20 MG/ML IV SOLN
INTRAVENOUS | Status: DC | PRN
  Administered 2016-09-15: 50 mg via INTRATRACHEAL

## 2016-09-15 MED ORDER — MORPHINE SULFATE (PF) 2 MG/ML IV SOLN
2.0000 mg | INTRAVENOUS | Status: DC | PRN
  Administered 2016-09-15 – 2016-09-16 (×3): 2 mg via INTRAVENOUS
  Filled 2016-09-15 (×3): qty 1

## 2016-09-15 MED ORDER — CLINDAMYCIN PHOSPHATE 900 MG/50ML IV SOLN
900.0000 mg | INTRAVENOUS | Status: AC
  Administered 2016-09-15: 900 mg via INTRAVENOUS

## 2016-09-15 MED ORDER — OXYCODONE HCL 5 MG/5ML PO SOLN: 5.0000 mg | Freq: Once | ORAL | Status: DC | PRN

## 2016-09-15 MED ORDER — PHENYLEPHRINE HCL 10 MG/ML IJ SOLN
INTRAVENOUS | Status: DC | PRN
  Administered 2016-09-15: 25 ug/min via INTRAVENOUS

## 2016-09-15 MED ORDER — MONTELUKAST SODIUM 10 MG PO TABS
10.0000 mg | ORAL_TABLET | Freq: Every day | ORAL | Status: DC
  Administered 2016-09-15 – 2016-09-17 (×3): 10 mg via ORAL
  Filled 2016-09-15 (×3): qty 1

## 2016-09-15 MED ORDER — BUPIVACAINE HCL (PF) 0.25 % IJ SOLN
INTRAMUSCULAR | Status: AC
  Filled 2016-09-15: qty 30

## 2016-09-15 MED ORDER — METOCLOPRAMIDE HCL 5 MG PO TABS: 5.0000 mg | ORAL_TABLET | Freq: Three times a day (TID) | ORAL | Status: DC | PRN

## 2016-09-15 MED ORDER — SUGAMMADEX SODIUM 200 MG/2ML IV SOLN
INTRAVENOUS | Status: DC | PRN
  Administered 2016-09-15: 200 mg via INTRAVENOUS

## 2016-09-15 MED ORDER — CHLORHEXIDINE GLUCONATE 4 % EX LIQD: 60.0000 mL | Freq: Once | CUTANEOUS | Status: DC

## 2016-09-15 MED ORDER — ROCURONIUM BROMIDE 100 MG/10ML IV SOLN
INTRAVENOUS | Status: DC | PRN
  Administered 2016-09-15: 50 mg via INTRAVENOUS

## 2016-09-15 MED ORDER — FENTANYL CITRATE (PF) 100 MCG/2ML IJ SOLN
INTRAMUSCULAR | Status: AC
  Administered 2016-09-15: 100 ug
  Filled 2016-09-15: qty 2

## 2016-09-15 MED ORDER — ATORVASTATIN CALCIUM 10 MG PO TABS
10.0000 mg | ORAL_TABLET | Freq: Every day | ORAL | Status: DC
Start: 2016-09-15 — End: 2016-09-18
  Administered 2016-09-15 – 2016-09-17 (×3): 10 mg via ORAL
  Filled 2016-09-15 (×3): qty 1

## 2016-09-15 MED ORDER — FLUTICASONE PROPIONATE 50 MCG/ACT NA SUSP
1.0000 | Freq: Every day | NASAL | Status: DC
  Filled 2016-09-15 (×2): qty 16

## 2016-09-15 MED ORDER — BIOTIN 5000 MCG PO CAPS: 1.0000 | ORAL_CAPSULE | Freq: Every day | ORAL | Status: DC

## 2016-09-15 MED ORDER — MIDAZOLAM HCL 2 MG/2ML IJ SOLN
INTRAMUSCULAR | Status: AC
  Administered 2016-09-15: 2 mg
  Filled 2016-09-15: qty 2

## 2016-09-15 MED ORDER — ONDANSETRON HCL 4 MG/2ML IJ SOLN
INTRAMUSCULAR | Status: DC | PRN
  Administered 2016-09-15: 4 mg via INTRAVENOUS

## 2016-09-15 MED ORDER — OXYCODONE HCL 5 MG PO TABS: 5.0000 mg | ORAL_TABLET | Freq: Once | ORAL | Status: DC | PRN

## 2016-09-15 MED ORDER — METOCLOPRAMIDE HCL 5 MG/ML IJ SOLN: 5.0000 mg | Freq: Three times a day (TID) | INTRAMUSCULAR | Status: DC | PRN

## 2016-09-15 MED ORDER — INSULIN ASPART 100 UNIT/ML ~~LOC~~ SOLN
4.0000 [IU] | Freq: Three times a day (TID) | SUBCUTANEOUS | Status: DC
  Administered 2016-09-15 – 2016-09-17 (×6): 4 [IU] via SUBCUTANEOUS

## 2016-09-15 MED ORDER — INSULIN ASPART 100 UNIT/ML ~~LOC~~ SOLN
0.0000 [IU] | Freq: Every day | SUBCUTANEOUS | Status: DC
  Administered 2016-09-15: 3 [IU] via SUBCUTANEOUS

## 2016-09-15 MED ORDER — EPINEPHRINE PF 1 MG/ML IJ SOLN
INTRAMUSCULAR | Status: AC
  Filled 2016-09-15: qty 1

## 2016-09-15 MED ORDER — FENTANYL CITRATE (PF) 250 MCG/5ML IJ SOLN
INTRAMUSCULAR | Status: AC
  Filled 2016-09-15: qty 5

## 2016-09-15 MED ORDER — BISACODYL 10 MG RE SUPP: 10.0000 mg | Freq: Every day | RECTAL | Status: DC | PRN

## 2016-09-15 SURGICAL SUPPLY — 72 items
BIT DRILL 170X2.5X (BIT) ×1 IMPLANT
BIT DRILL 5/64X5 DISP (BIT) ×3 IMPLANT
BIT DRL 170X2.5X (BIT) ×1
BLADE SAG 18X100X1.27 (BLADE) ×3 IMPLANT
CAPT SHLDR REVTOTAL 1 ×3 IMPLANT
CLOSURE STERI-STRIP 1/2X4 (GAUZE/BANDAGES/DRESSINGS) ×1
CLOSURE WOUND 1/2 X4 (GAUZE/BANDAGES/DRESSINGS) ×1
CLSR STERI-STRIP ANTIMIC 1/2X4 (GAUZE/BANDAGES/DRESSINGS) ×2 IMPLANT
COVER SURGICAL LIGHT HANDLE (MISCELLANEOUS) ×3 IMPLANT
DRAPE IMP U-DRAPE 54X76 (DRAPES) ×6 IMPLANT
DRAPE INCISE IOBAN 66X45 STRL (DRAPES) ×3 IMPLANT
DRAPE ORTHO SPLIT 77X108 STRL (DRAPES) ×4
DRAPE SURG ORHT 6 SPLT 77X108 (DRAPES) ×2 IMPLANT
DRAPE U-SHAPE 47X51 STRL (DRAPES) ×3 IMPLANT
DRAPE X-RAY CASS 24X20 (DRAPES) IMPLANT
DRILL 2.5 (BIT) ×2
DRSG ADAPTIC 3X8 NADH LF (GAUZE/BANDAGES/DRESSINGS) ×3 IMPLANT
DRSG PAD ABDOMINAL 8X10 ST (GAUZE/BANDAGES/DRESSINGS) ×3 IMPLANT
DURAPREP 26ML APPLICATOR (WOUND CARE) ×3 IMPLANT
ELECT BLADE 4.0 EZ CLEAN MEGAD (MISCELLANEOUS) ×3
ELECT NEEDLE TIP 2.8 STRL (NEEDLE) ×3 IMPLANT
ELECT REM PT RETURN 9FT ADLT (ELECTROSURGICAL) ×3
ELECTRODE BLDE 4.0 EZ CLN MEGD (MISCELLANEOUS) ×1 IMPLANT
ELECTRODE REM PT RTRN 9FT ADLT (ELECTROSURGICAL) ×1 IMPLANT
GAUZE SPONGE 4X4 12PLY STRL (GAUZE/BANDAGES/DRESSINGS) ×3 IMPLANT
GAUZE SPONGE 4X4 16PLY XRAY LF (GAUZE/BANDAGES/DRESSINGS) ×3 IMPLANT
GLOVE BIOGEL PI ORTHO PRO 7.5 (GLOVE) ×2
GLOVE BIOGEL PI ORTHO PRO SZ8 (GLOVE) ×2
GLOVE ORTHO TXT STRL SZ7.5 (GLOVE) ×3 IMPLANT
GLOVE PI ORTHO PRO STRL 7.5 (GLOVE) ×1 IMPLANT
GLOVE PI ORTHO PRO STRL SZ8 (GLOVE) ×1 IMPLANT
GLOVE SURG ORTHO 8.5 STRL (GLOVE) ×3 IMPLANT
GLOVE SURG SS PI 7.0 STRL IVOR (GLOVE) ×3 IMPLANT
GOWN STRL REUS W/ TWL LRG LVL3 (GOWN DISPOSABLE) ×1 IMPLANT
GOWN STRL REUS W/ TWL XL LVL3 (GOWN DISPOSABLE) ×2 IMPLANT
GOWN STRL REUS W/TWL LRG LVL3 (GOWN DISPOSABLE) ×2
GOWN STRL REUS W/TWL XL LVL3 (GOWN DISPOSABLE) ×4
HANDPIECE INTERPULSE COAX TIP (DISPOSABLE)
KIT BASIN OR (CUSTOM PROCEDURE TRAY) ×3 IMPLANT
KIT ROOM TURNOVER OR (KITS) ×3 IMPLANT
MANIFOLD NEPTUNE II (INSTRUMENTS) ×3 IMPLANT
NEEDLE 1/2 CIR MAYO (NEEDLE) ×3 IMPLANT
NEEDLE HYPO 25GX1X1/2 BEV (NEEDLE) ×3 IMPLANT
NS IRRIG 1000ML POUR BTL (IV SOLUTION) ×3 IMPLANT
PACK SHOULDER (CUSTOM PROCEDURE TRAY) ×3 IMPLANT
PAD ARMBOARD 7.5X6 YLW CONV (MISCELLANEOUS) ×6 IMPLANT
PIN GUIDE 1.2 (PIN) ×3 IMPLANT
PIN GUIDE GLENOPHERE 1.5MX300M (PIN) ×3 IMPLANT
PIN METAGLENE 2.5 (PIN) ×3 IMPLANT
SET HNDPC FAN SPRY TIP SCT (DISPOSABLE) IMPLANT
SLING ARM IMMOBILIZER LRG (SOFTGOODS) ×3 IMPLANT
SLING ARM LRG ADULT FOAM STRAP (SOFTGOODS) IMPLANT
SLING ARM MED ADULT FOAM STRAP (SOFTGOODS) IMPLANT
SPONGE LAP 18X18 X RAY DECT (DISPOSABLE) IMPLANT
SPONGE LAP 4X18 X RAY DECT (DISPOSABLE) ×3 IMPLANT
STRIP CLOSURE SKIN 1/2X4 (GAUZE/BANDAGES/DRESSINGS) ×2 IMPLANT
SUCTION FRAZIER HANDLE 10FR (MISCELLANEOUS) ×2
SUCTION TUBE FRAZIER 10FR DISP (MISCELLANEOUS) ×1 IMPLANT
SUT FIBERWIRE #2 38 T-5 BLUE (SUTURE) ×6
SUT MNCRL AB 4-0 PS2 18 (SUTURE) ×3 IMPLANT
SUT VIC AB 0 CT2 27 (SUTURE) ×3 IMPLANT
SUT VIC AB 2-0 CT1 27 (SUTURE) ×2
SUT VIC AB 2-0 CT1 TAPERPNT 27 (SUTURE) ×1 IMPLANT
SUT VICRYL 0 CT 1 36IN (SUTURE) ×3 IMPLANT
SUTURE FIBERWR #2 38 T-5 BLUE (SUTURE) ×2 IMPLANT
SYR CONTROL 10ML LL (SYRINGE) ×3 IMPLANT
TOWEL OR 17X24 6PK STRL BLUE (TOWEL DISPOSABLE) IMPLANT
TOWEL OR 17X26 10 PK STRL BLUE (TOWEL DISPOSABLE) ×3 IMPLANT
TOWER CARTRIDGE SMART MIX (DISPOSABLE) IMPLANT
TRAY FOLEY W/METER SILVER 16FR (SET/KITS/TRAYS/PACK) IMPLANT
WATER STERILE IRR 1000ML POUR (IV SOLUTION) ×3 IMPLANT
YANKAUER SUCT BULB TIP NO VENT (SUCTIONS) ×3 IMPLANT

## 2016-09-15 NOTE — Transfer of Care (Signed)
Immediate Anesthesia Transfer of Care Note  Patient: [name]  Procedure(s) Performed: Procedure(s): RIGHT REVERSE SHOULDER ARTHROPLASTY (Right)  Patient Location: PACU  Anesthesia Type:GA combined with regional for post-op pain  Level of Consciousness: awake, alert  and oriented  Airway & Oxygen Therapy: Patient Spontanous Breathing and Patient connected to nasal cannula oxygen  Post-op Assessment: Report given to RN and Post -op Vital signs reviewed and stable  Post vital signs: Reviewed and stable  Last Vitals:  Vitals:   09/15/16 0838 09/15/16 0930  BP: (!) 128/57 (!) 110/41  Pulse: 72 69  Resp: 20 18  Temp: 36.8 C     Last Pain:  Vitals:   09/15/16 0838  TempSrc: Oral         Complications: No apparent anesthesia complications

## 2016-09-15 NOTE — Interval H&P Note (Signed)
History and Physical Interval Note:  09/15/2016 10:01 AM  [name]  has presented today for surgery, with the diagnosis of Right shoulder rotator cuff arthropathy  The various methods of treatment have been discussed with the patient and family. After consideration of risks, benefits and other options for treatment, the patient has consented to  Procedure(s): RIGHT REVERSE SHOULDER ARTHROPLASTY (Right) as a surgical intervention .  The patient's history has been reviewed, patient examined, no change in status, stable for surgery.  I have reviewed the patient's chart and labs.  Questions were answered to the patient's satisfaction.     Orlandis Sanden,STEVEN R

## 2016-09-15 NOTE — Brief Op Note (Signed)
09/15/2016  12:30 PM  PATIENT:  [name]  76 y.o. female  PRE-OPERATIVE DIAGNOSIS:  Right shoulder rotator cuff arthropathy  POST-OPERATIVE DIAGNOSIS:  Right shoulder rotator cuff arthropathy  PROCEDURE:  Procedure(s): RIGHT REVERSE SHOULDER ARTHROPLASTY (Right) DePuy Delta Xtend  SURGEON:  Surgeon(s) and Role:    * Netta Cedars, MD - Primary  PHYSICIAN ASSISTANT:   ASSISTANTS: Ventura Bruns, Pa-C   ANESTHESIA:   regional and general  EBL:  Total I/O In: 1000 [I.V.:1000] Out: 200 [Blood:200]  BLOOD ADMINISTERED:none  DRAINS: none   LOCAL MEDICATIONS USED:  MARCAINE     SPECIMEN:  No Specimen  DISPOSITION OF SPECIMEN:  N/A  COUNTS:  YES  TOURNIQUET:  * No tourniquets in log *  DICTATION: .Other Dictation: Dictation Number [id number]  PLAN OF CARE: Admit to inpatient   PATIENT DISPOSITION:  PACU - hemodynamically stable.   Delay start of Pharmacological VTE agent (>24hrs) due to surgical blood loss or risk of bleeding: not applicable

## 2016-09-15 NOTE — Progress Notes (Signed)
PHARMACIST - PHYSICIAN ORDER COMMUNICATION  CONCERNING: P&T Medication Policy on Herbal Medications  DESCRIPTION:  This patient's order for:  biotin  has been noted.  This product(s) is classified as an "herbal" or natural product. Due to a lack of definitive safety studies or FDA approval, nonstandard manufacturing practices, plus the potential risk of unknown drug-drug interactions while on inpatient medications, the Pharmacy and Therapeutics Committee does not permit the use of "herbal" or natural products of this type within [facility]   ACTION TAKEN: The pharmacy department is unable to verify this order at this time and your patient has been informed of this safety policy. Please reevaluate patient's clinical condition at discharge and address if the herbal or natural product(s) should be resumed at that time.  Renold Genta, PharmD, [place] Pharmacist Main pharmacy [phone] 09/15/2016 4:36 PM

## 2016-09-15 NOTE — Anesthesia Preprocedure Evaluation (Signed)
Anesthesia Evaluation  Patient identified by MRN, date of birth, ID band Patient awake    Reviewed: Allergy & Precautions, H&P , NPO status , Patient's Chart, lab work & pertinent test results  Airway Mallampati: III  TM Distance: >3 FB Neck ROM: Full    Dental no notable dental hx. (+) Teeth Intact, Chipped, Dental Advisory Given   Pulmonary asthma , former smoker,    Pulmonary exam normal breath sounds clear to auscultation       Cardiovascular negative cardio ROS   Rhythm:Regular Rate:Normal     Neuro/Psych  Headaches, Depression negative psych ROS   GI/Hepatic negative GI ROS, Neg liver ROS,   Endo/Other  diabetes, Well Controlled, Type 2, Oral Hypoglycemic Agents  Renal/GU negative Renal ROS  negative genitourinary   Musculoskeletal   Abdominal   Peds  Hematology negative hematology ROS (+)   Anesthesia Other Findings   Reproductive/Obstetrics negative OB ROS                             Anesthesia Physical  Anesthesia Plan  ASA: III  Anesthesia Plan: General and Regional   Post-op Pain Management: GA combined w/ Regional for post-op pain   Induction: Intravenous  Airway Management Planned: Oral ETT  Additional Equipment:   Intra-op Plan:   Post-operative Plan: Extubation in OR  Informed Consent: I have reviewed the patients History and Physical, chart, labs and discussed the procedure including the risks, benefits and alternatives for the proposed anesthesia with the patient or authorized representative who has indicated his/her understanding and acceptance.   Dental advisory given  Plan Discussed with: CRNA  Anesthesia Plan Comments:         Anesthesia Quick Evaluation

## 2016-09-15 NOTE — Anesthesia Procedure Notes (Signed)
Anesthesia Regional Block: Interscalene brachial plexus block   Pre-Anesthetic Checklist: ,, timeout performed, Correct Patient, Correct Site, Correct Laterality, Correct Procedure, Correct Position, site marked, Risks and benefits discussed,  Surgical consent,  Pre-op evaluation,  At surgeon's request and post-op pain management  Laterality: Right  Prep: chloraprep       Needles:  Injection technique: Single-shot  Needle Type: Stimiplex     Needle Length: 9cm  Needle Gauge: 21     Additional Needles:   Procedures: ultrasound guided,,,,,,,,  Narrative:  Start time: 09/15/2016 9:26 AM End time: 09/15/2016 9:31 AM Injection made incrementally with aspirations every 5 mL.  Performed by: Personally  Anesthesiologist: Candida Peeling RAY

## 2016-09-15 NOTE — Discharge Instructions (Signed)
Ice to the shoulder as much as you can.  Keep the shoulder incision clean and dry and covered for one week, then ok to get it wet in the shower.  Keep a pillow propped behind the right elbow to keep your arm across your waist while resting.  May remove the sling to use the shoulder for light daily activity.  Do not reach behind you or push your full weight out of the chair  Follow up with Dr Veverly Fells in the office in two weeks, call for appointment 336 [phone]

## 2016-09-15 NOTE — Anesthesia Procedure Notes (Signed)
Procedure Name: Intubation Date/Time: 09/15/2016 10:25 AM Performed by: Mariea Clonts Pre-anesthesia Checklist: Patient identified, Emergency Drugs available, Suction available and Patient being monitored Patient Re-evaluated:Patient Re-evaluated prior to inductionOxygen Delivery Method: Circle System Utilized Preoxygenation: Pre-oxygenation with 100% oxygen Intubation Type: IV induction Ventilation: Mask ventilation without difficulty Laryngoscope Size: Mac and 3 Grade View: Grade IV Tube type: Oral Tube size: 7.0 mm Number of attempts: 3 Airway Equipment and Method: Oral airway and Bougie stylet Placement Confirmation: ETT inserted through vocal cords under direct vision,  positive ETCO2 and breath sounds checked- equal and bilateral Tube secured with: Tape Dental Injury: Teeth and Oropharynx as per pre-operative assessment  Difficulty Due To: Difficulty was unanticipated, Difficult Airway- due to anterior larynx and Difficult Airway- due to reduced neck mobility Future Recommendations: Recommend- induction with short-acting agent, and alternative techniques readily available

## 2016-09-16 LAB — BASIC METABOLIC PANEL
ANION GAP: 8 (ref 5–15)
BUN: 13 mg/dL (ref 6–20)
CO2: 26 mmol/L (ref 22–32)
Calcium: 8.7 mg/dL — ABNORMAL LOW (ref 8.9–10.3)
Chloride: 103 mmol/L (ref 101–111)
Creatinine, Ser: 0.77 mg/dL (ref 0.44–1.00)
GFR calc non Af Amer: >60 mL/min (ref >60)
GLUCOSE: 186 mg/dL — AB (ref 65–99)
POTASSIUM: 3.8 mmol/L (ref 3.5–5.1)
Sodium: 137 mmol/L (ref 135–145)

## 2016-09-16 LAB — GLUCOSE, CAPILLARY
GLUCOSE-CAPILLARY: 212 mg/dL — AB (ref 65–99)
GLUCOSE-CAPILLARY: 170 mg/dL — AB (ref 65–99)
GLUCOSE-CAPILLARY: 196 mg/dL — AB (ref 65–99)
Glucose-Capillary: 201 mg/dL — ABNORMAL HIGH (ref 65–99)

## 2016-09-16 LAB — HEMOGLOBIN AND HEMATOCRIT, BLOOD
HCT: 32.1 % — ABNORMAL LOW (ref 36.0–46.0)
HEMOGLOBIN: 10.5 g/dL — AB (ref 12.0–15.0)

## 2016-09-16 MED ORDER — TRAMADOL HCL 50 MG PO TABS
50.0000 mg | ORAL_TABLET | Freq: Four times a day (QID) | ORAL | Status: DC | PRN
  Administered 2016-09-16: 50 mg via ORAL
  Administered 2016-09-18: 100 mg via ORAL
  Filled 2016-09-16: qty 2
  Filled 2016-09-16: qty 1

## 2016-09-16 MED ORDER — OXYCODONE HCL 5 MG PO TABS: 5.0000 mg | ORAL_TABLET | ORAL | 0 refills | Status: DC | PRN

## 2016-09-16 MED ORDER — OXYCODONE HCL 5 MG PO TABS
5.0000 mg | ORAL_TABLET | ORAL | Status: DC | PRN
  Administered 2016-09-16 – 2016-09-17 (×7): 5 mg via ORAL
  Filled 2016-09-16 (×8): qty 1

## 2016-09-16 MED ORDER — TRAMADOL HCL 50 MG PO TABS: 50.0000 mg | ORAL_TABLET | Freq: Four times a day (QID) | ORAL | 0 refills | Status: DC | PRN

## 2016-09-16 NOTE — Progress Notes (Signed)
09/16/16 1632  PT Evaluation Information  Last PT Received On 09/16/16  Assistance Needed +1  History of Present Illness Pt is a 76 y.o. female s/p R reverse total shoulder arthroplasty. She has a PMH significant for asthma, diabetes mellitus, glaucoma, hyperlipidemia, migraines, and palpitations.  Precautions  Precautions Shoulder  Type of Shoulder Precautions Active protocol: sling for comfort and sleep, NWB R UE, elbow/wrist/hand AROM, AROM/PROM R shoulder (forward flexion: 0-90, abduction: 0-60, external rotation: 0-30).  Shoulder Interventions Shoulder sling/immobilizer;For comfort;Off for dressing/bathing/exercises  Required Braces or Orthoses Sling  Restrictions  RUE Weight Bearing NWB  Home Living  Family/patient expects to be discharged to: Skilled nursing facility [facility])  Additional Comments Lives with husband who uses a RW himself and is on dialysis and pt reports that he will not be able to help him at home.  Prior Function  Level of Independence Independent  Communication  Communication No difficulties  Pain Assessment  Pain Assessment 0-10  Pain Score 10  Pain Location R shoulder  Pain Descriptors / Indicators Aching;Burning  Pain Intervention(s) Limited activity within patient's tolerance;Monitored during session;Repositioned  Cognition  Arousal/Alertness Lethargic;Suspect due to medications  Behavior During Therapy [facility] for tasks assessed/performed  Overall Cognitive Status Within Functional Limits for tasks assessed  Upper Extremity Assessment  Upper Extremity Assessment Defer to OT evaluation  Lower Extremity Assessment  Lower Extremity Assessment Generalized weakness  Cervical / Trunk Assessment  Cervical / Trunk Assessment Normal  Bed Mobility  Overal bed mobility Needs Assistance  Bed Mobility Rolling;Sidelying to Sit  Rolling Min guard  Sidelying to sit Min guard;HOB elevated  General bed mobility comments HOB maximally elevated, pt rolled to  left shoulder and pushed up on left arm.  Pt with heavy reliance on railing and multiple attempts to get to sitting EOB.  Min guard assist for safety during transitions.   Transfers  Overall transfer level Needs assistance  Equipment used None (IV pole)  Transfers Sit to/from Stand  Sit to Stand Min guard  General transfer comment Min guard assist for safety as pt is a bit unsteady on her feet and reliant on hands for transitions.   Ambulation/Gait  Ambulation/Gait assistance Min assist  Ambulation Distance (Feet) 75 Feet  Assistive device None (IV pole)  Gait Pattern/deviations Step-through pattern;Staggering right;Staggering left  General Gait Details Pt's eyes half open during gait, reporting nausea, staggering gait pattern and holding IV pole wiht her left hand while therapist steadied her at the trunk for balance.   Gait velocity decreased  Gait velocity interpretation Below normal speed for age/gender  Balance  Overall balance assessment Needs assistance  Sitting-balance support No upper extremity supported  Sitting balance-Leahy Scale Good  Standing balance support Single extremity supported  Standing balance-Leahy Scale Fair  General Comments  General comments (skin integrity, edema, etc.) O2 sats 90% on 2.5 L O2 [place] during gait and DOE 2/4.    PT - End of Session  Equipment Utilized During Treatment Gait belt;Other (comment) (R arm sling)  Activity Tolerance Patient limited by lethargy;Patient limited by pain  Patient left in chair;with call bell/phone within reach;with family/visitor present  PT Assessment  PT Recommendation/Assessment Patient needs continued PT services  PT Visit Diagnosis Muscle weakness (generalized) (M62.81);Unsteadiness on feet (R26.81);Pain  Pain - Right/Left Right  Pain - part of body Shoulder  PT Problem List Decreased strength;Decreased activity tolerance;Decreased balance;Decreased mobility;Decreased knowledge of use of DME;Decreased knowledge of  precautions;Pain;Cardiopulmonary status limiting activity  Barriers to Discharge Decreased caregiver support  Barriers to Discharge Comments husband in HD and uses a RW himself, cannot provide physical help  PT Plan  PT Frequency (ACUTE ONLY) Min 3X/week  PT Treatment/Interventions (ACUTE ONLY) DME instruction;Gait training;Functional mobility training;Therapeutic activities;Therapeutic exercise;Balance training;Patient/family education;Modalities  AM-PAC PT "6 Clicks" Daily Activity Outcome Measure  Difficulty turning over in bed (including adjusting bedclothes, sheets and blankets)? 3  Difficulty moving from lying on back to sitting on the side of the bed?  1  Difficulty sitting down on and standing up from a chair with arms (e.g., wheelchair, bedside commode, etc,.)? 1  Help needed moving to and from a bed to chair (including a wheelchair)? 3  Help needed walking in hospital room? 3  Help needed climbing 3-5 steps with a railing?  2  6 Click Score 13  Mobility G Code  CK  PT Recommendation  Follow Up Recommendations SNF  PT equipment None recommended by PT  Individuals Consulted  Consulted and Agree with Results and Recommendations Family member/caregiver;Patient  Family Member Consulted husband  Acute Rehab PT Goals  Patient Stated Goal to go to rehab before going home.   PT Goal Formulation With patient/family  Time For Goal Achievement 09/23/16  Potential to Achieve Goals Good  PT Time Calculation  PT Start Time (ACUTE ONLY) 1556  PT Stop Time (ACUTE ONLY) 1623  PT Time Calculation (min) (ACUTE ONLY) 27 min  PT General Charges  $$ ACUTE PT VISIT 1 Procedure  PT Evaluation  $PT Eval Moderate Complexity 1 Procedure  PT Treatments  $Gait Training 8-22 mins  Written Expression  Dominant Hand Right  09/16/2016 Pt is very groggy, likely due to pain meds and off balance up on her feet requiring min to min guard assist to be safe with all mobility.  She is most appropriate for SNF  level rehab at discharge and is requesting [facility].   PT to follow acutely for deficits listed below.   Barbarann Ehlers [place], [place], DPT [phone]

## 2016-09-16 NOTE — Clinical Social Work Note (Signed)
Clinical Social Work Assessment  Patient Details  Name: [name] MRN: [id number] Date of Birth: [birth year]  Date of referral:  09/16/16               Reason for consult:  Discharge Planning                Permission sought to share information with:  Facility Sport and exercise psychologist, Family Supports Permission granted to share information::  Yes, Verbal Permission Granted  Name::     [name]  Agency::     Relationship::  Spouse  Contact Information:  [phone]  Housing/Transportation Living arrangements for the past 2 months:  Single Family Home Source of Information:  Patient Patient Interpreter Needed:  None Criminal Activity/Legal Involvement Pertinent to Current Situation/Hospitalization:  No - Comment as needed Significant Relationships:  Spouse Lives with:  Spouse Do you feel safe going back to the place where you live?  Yes Need for family participation in patient care:  Yes (Comment)  Care giving concerns:  No caregiving concerns identified.    Social Worker assessment / plan:  CSW met with pt and spouse to address consult for new SNF. CSW introduced herself and explained role of social work. P/T is recommending STR at SNF and pt agreeable. CSW explained discharging to SNF with Medicare.  Pt from IDL at [facility] and will be going to [facility]. ([place])   CSW will sent FL-2 to [facility]. CSW will continue to follow.   Employment status:  Retired Forensic scientist:  Medicare PT Recommendations:  [place] / Referral to community resources:  [place]  Patient/Family's Response to care:  Pt and spouse appreciative of CSW support and guidance.   Patient/Family's Understanding of and Emotional Response to Diagnosis, Current Treatment, and Prognosis:  Pt and spouse appear to understand diagnosis and prognosis. Pt and spouse in agreement with current treatment plan.   Emotional  Assessment Appearance:  Appears stated age Attitude/Demeanor/Rapport:  Other (Appropriate) Affect (typically observed):  Accepting, Adaptable Orientation:  Oriented to Self, Oriented to Place, Oriented to  Time, Oriented to Situation Alcohol / Substance use:  Other Psych involvement (Current and /or in the community):  No (Comment)  Discharge Needs  Concerns to be addressed:  Care Coordination Readmission within the last 30 days:  No Current discharge risk:  Dependent with Mobility Barriers to Discharge:  Continued Medical Work up   Truitt Merle, LCSW 09/16/2016, 6:39 PM

## 2016-09-16 NOTE — Clinical Social Work Placement (Signed)
   CLINICAL SOCIAL WORK PLACEMENT  NOTE  Date:  09/16/2016  Patient Details  Name: [name] MRN: [id number] Date of Birth: 1940-12-14  Clinical Social Work is seeking post-discharge placement for this patient at the [place] level of care (*CSW will initial, date and re-position this form in  chart as items are completed):  Yes   Patient/family provided with [facility] Work Department's list of facilities offering this level of care within the geographic area requested by the patient (or if unable, by the patient's family).  Yes   Patient/family informed of their freedom to choose among providers that offer the needed level of care, that participate in Medicare, Medicaid or managed care program needed by the patient, have an available bed and are willing to accept the patient.  Yes   Patient/family informed of [facility]'s ownership interest in [facility] and [facility], as well as of the fact that they are under no obligation to receive care at these facilities.  PASRR submitted to EDS on       PASRR number received on       Existing PASRR number confirmed on 09/16/16     FL2 transmitted to all facilities in geographic area requested by pt/family on 09/16/16     FL2 transmitted to all facilities within larger geographic area on       Patient informed that his/her managed care company has contracts with or will negotiate with certain facilities, including the following:            Patient/family informed of bed offers received.  Patient chooses bed at       Physician recommends and patient chooses bed at      Patient to be transferred to   on  .  Patient to be transferred to facility by       Patient family notified on   of transfer.  Name of family member notified:        PHYSICIAN       Additional Comment:    _______________________________________________ Truitt Merle, [place] 09/16/2016, 6:28 PM

## 2016-09-16 NOTE — Evaluation (Signed)
Occupational Therapy Evaluation Patient Details Name: [name] MRN: [id number] DOB: Aug 02, 1940 Today's Date: 09/16/2016    History of Present Illness Pt is a 76 y.o. female s/p R reverse total shoulder arthroplasty. She has a PMH significant for asthma, diabetes mellitus, glaucoma, hyperlipidemia, migraines, and palpitations.   Clinical Impression   PTA, pt was independent with ADL and functional mobility. She lives with her husband who is unable to provide physical assistance post-acute D/C. Pt currently requires mod assist for UB dressing and min guard assist for toilet transfers. Educated pt concerning shoulder precautions during ADL as well as dressing/bathing techniques, positioning for sleeping, sling wear schedule, and active shoulder protocol HEP. Pt would benefit from continued OT services while admitted to improve independence with ADL and functional mobility. She demonstrates significant decline from PLOF and would benefit from short-term SNF placement prior to return home in order to maximize return to PLOF.    Follow Up Recommendations  SNF;Supervision/Assistance - 24 hour    Equipment Recommendations  Other (comment) (defer to next venue of care)    Recommendations for Other Services       Precautions / Restrictions Precautions Precautions: Shoulder Type of Shoulder Precautions: Active protocol: sling for comfort and sleep, NWB R UE, elbow/wrist/hand AROM, AROM/PROM R shoulder (forward flexion: 0-90, abduction: 0-60, external rotation: 0-30). Shoulder Interventions: Shoulder sling/immobilizer;For comfort;Off for dressing/bathing/exercises Precaution Booklet Issued: Yes (comment) Precaution Comments: Educated pt on shoulder precautions during ADL with handout provided. Additionally educated pt concerning active protocol HEP with written instructions provided. Required Braces or Orthoses: Sling Restrictions Weight Bearing Restrictions: Yes RUE Weight Bearing: Non  weight bearing      Mobility Bed Mobility               General bed mobility comments: OOB in recliner on arrival  Transfers Overall transfer level: Needs assistance Equipment used: None Transfers: Sit to/from Stand Sit to Stand: Min guard         General transfer comment: Min guard assist for safety.    Balance Overall balance assessment: Needs assistance Sitting-balance support: No upper extremity supported;Feet supported Sitting balance-Leahy Scale: Good     Standing balance support: No upper extremity supported;During functional activity Standing balance-Leahy Scale: Fair                             ADL either performed or assessed with clinical judgement   ADL Overall ADL's : Needs assistance/impaired Eating/Feeding: Set up;Sitting   Grooming: Set up;Sitting   Upper Body Bathing: Moderate assistance;Sitting   Lower Body Bathing: Minimal assistance;Sit to/from stand   Upper Body Dressing : Minimal assistance;Sitting   Lower Body Dressing: Minimal assistance;Sit to/from stand   Toilet Transfer: Min guard;Ambulation;BSC   Toileting- Water quality scientist and Hygiene: Min guard;Sit to/from stand       Functional mobility during ADLs: Min guard General ADL Comments: Educated pt on dressing/bathing techniques, sling wear schedule, HEP for active protocol, and positioning for sleeping.      Vision Baseline Vision/History: Glaucoma Patient Visual Report: No change from baseline Vision Assessment?: No apparent visual deficits     Perception     Praxis      Pertinent Vitals/Pain Pain Assessment: 0-10 Pain Score: 9  Pain Location: R shoulder Pain Descriptors / Indicators: Aching;Operative site guarding Pain Intervention(s): Limited activity within patient's tolerance;Repositioned;Premedicated before session     Hand Dominance Right   Extremity/Trunk Assessment Upper Extremity Assessment Upper Extremity Assessment: RUE  deficits/detail RUE Deficits / Details: Decreased strength and ROM within parameters of protocol as expected post-operatively.   Lower Extremity Assessment Lower Extremity Assessment: Generalized weakness       Communication Communication Communication: No difficulties   Cognition Arousal/Alertness: Awake/alert;Lethargic;Suspect due to medications (Lethargic likely due to meds at end of session) Behavior During Therapy: [facility] for tasks assessed/performed Overall Cognitive Status: Within Functional Limits for tasks assessed                                     General Comments       Exercises Exercises: Shoulder Shoulder Exercises Shoulder Flexion: AAROM;Right;10 reps;Supine (0-90, able to achieve ~0-15) Shoulder ABduction: AAROM;Right;10 reps;Seated (0-60, able to achieve ~0-20) Shoulder External Rotation: AAROM;Right;10 reps;Supine (0-30, able to achieve ~0-15) Elbow Flexion: AROM;10 reps;Right;Standing Wrist Flexion: AROM;Right;10 reps;Seated Digit Composite Flexion: AROM;Right;10 reps;Seated Donning/doffing shirt without moving shoulder: Moderate assistance Method for sponge bathing under operated UE: Minimal assistance Donning/doffing sling/immobilizer: Moderate assistance Correct positioning of sling/immobilizer: Minimal assistance ROM for elbow, wrist and digits of operated UE: Supervision/safety Sling wearing schedule (on at all times/off for ADL's): Supervision/safety Proper positioning of operated UE when showering: Minimal assistance Positioning of UE while sleeping: Minimal assistance   Shoulder Instructions Shoulder Instructions Donning/doffing shirt without moving shoulder: Moderate assistance Method for sponge bathing under operated UE: Minimal assistance Donning/doffing sling/immobilizer: Moderate assistance Correct positioning of sling/immobilizer: Minimal assistance ROM for elbow, wrist and digits of operated UE: Supervision/safety Sling wearing  schedule (on at all times/off for ADL's): Supervision/safety Proper positioning of operated UE when showering: Minimal assistance Positioning of UE while sleeping: Minimal assistance    Home Living Family/patient expects to be discharged to:: Skilled nursing facility [facility])                                 Additional Comments: Lives with husband who is on dialysis and pt reports that he will not be able to help him at home.      Prior Functioning/Environment Level of Independence: Independent                 OT Problem List: Decreased strength;Decreased range of motion;Decreased activity tolerance;Impaired balance (sitting and/or standing);Decreased safety awareness;Decreased knowledge of use of DME or AE;Decreased knowledge of precautions;Impaired UE functional use;Pain      OT Treatment/Interventions: Self-care/ADL training;Therapeutic exercise;Energy conservation;DME and/or AE instruction;Therapeutic activities;Patient/family education;Balance training    OT Goals(Current goals can be found in the care plan section) Acute Rehab OT Goals Patient Stated Goal: to learn her exercises OT Goal Formulation: With patient Time For Goal Achievement: 09/30/16 Potential to Achieve Goals: Good ADL Goals Pt Will Perform Upper Body Bathing: sitting;with supervision Pt Will Perform Upper Body Dressing: with supervision;sitting Pt Will Transfer to Toilet: ambulating;bedside commode;with modified independence (BSC over toilet) Pt Will Perform Toileting - Clothing Manipulation and hygiene: with modified independence;sit to/from stand Pt/caregiver will Perform Home Exercise Program: Right Upper extremity;With Supervision;With written HEP provided (active shoulder protocol HEP)  OT Frequency: Min 2X/week   Barriers to D/C:            Co-evaluation              End of Session Nurse Communication: Mobility status  Activity Tolerance: Patient tolerated treatment  well Patient left: in chair;with call bell/phone within reach  OT Visit Diagnosis: Pain Pain -  Right/Left: Right Pain - part of body: Shoulder                Time: 9024-0973 OT Time Calculation (min): 43 min Charges:  OT General Charges $OT Visit: 1 Procedure OT Evaluation $OT Eval Moderate Complexity: 1 Procedure OT Treatments $Self Care/Home Management : 23-37 mins G-Codes:     Norman Herrlich, MS OTR/L  Pager: [place] A Jyll Tomaro 09/16/2016, 10:45 AM

## 2016-09-16 NOTE — Progress Notes (Signed)
Orthopedics Progress Note  Subjective: Patient comfortable this morning  Objective:  Vitals:   09/16/16 0550 09/16/16 0953  BP: 119/71 (!) 113/49  Pulse: 88 67  Resp:  16  Temp: 98.7 F (37.1 C) 98.6 F (37 C)    General: Awake and alert  Musculoskeletal: right shoulder dressing CDI, NVI Neurovascularly intact  Lab Results  Component Value Date   WBC 7.1 09/08/2016   HGB 10.5 (L) 09/16/2016   HCT 32.1 (L) 09/16/2016   MCV 87.6 09/08/2016   PLT 283 09/08/2016       Component Value Date/Time   NA 137 09/16/2016 0409   K 3.8 09/16/2016 0409   CL 103 09/16/2016 0409   CO2 26 09/16/2016 0409   GLUCOSE 186 (H) 09/16/2016 0409   BUN 13 09/16/2016 0409   CREATININE 0.77 09/16/2016 0409   CALCIUM 8.7 (L) 09/16/2016 0409   GFRNONAA >60 09/16/2016 0409   GFRAA >60 09/16/2016 0409    No results found for: INR, PROTIME  Assessment/Plan: POD #1 s/p Procedure(s): RIGHT REVERSE SHOULDER ARTHROPLASTY Change pain meds to those not containing Tylenol  Remo Lipps R. Veverly Fells, MD 09/16/2016 10:27 AM

## 2016-09-16 NOTE — Progress Notes (Signed)
Occupational Therapy Treatment Patient Details Name: [name] MRN: [id number] DOB: 02/21/1941 Today's Date: 09/16/2016    History of present illness Pt is a 76 y.o. female s/p R reverse total shoulder arthroplasty. She has a PMH significant for asthma, diabetes mellitus, glaucoma, hyperlipidemia, migraines, and palpitations.   OT comments  Pt progressing toward OT goals. This session focused on continued active shoulder protocol education as well as reinforcement of HEP in order to improve functional use of R UE. Pt able to complete shoulder HEP with verbal cues for sequencing. Pt limited by pain and lethargy this session. SpO2 sustained at 88% on 2.5 L O2 and RN notified. OT will continue to follow acutely.   Follow Up Recommendations  SNF;Supervision/Assistance - 24 hour    Equipment Recommendations  Other (comment) (defer to next venue of care)    Recommendations for Other Services      Precautions / Restrictions Precautions Precautions: Shoulder Type of Shoulder Precautions: Active protocol: sling for comfort and sleep, NWB R UE, elbow/wrist/hand AROM, AROM/PROM R shoulder (forward flexion: 0-90, abduction: 0-60, external rotation: 0-30). Shoulder Interventions: Shoulder sling/immobilizer;For comfort;Off for dressing/bathing/exercises Precaution Booklet Issued: Yes (comment) Precaution Comments: Educated pt on shoulder precautions during ADL with handout provided. Additionally educated pt concerning active protocol HEP with written instructions provided. Required Braces or Orthoses: Sling Restrictions Weight Bearing Restrictions: Yes RUE Weight Bearing: Non weight bearing       Mobility Bed Mobility Overal bed mobility: Needs Assistance Bed Mobility: Rolling;Sidelying to Sit Rolling: (P) Min guard Sidelying to sit: (P) Min guard;HOB elevated       General bed mobility comments: (P) HOB maximally elevated, pt rolled to left shoulder and pushed up on left arm.  Pt  with heavy reliance on railing and multiple attempts to get to sitting EOB.  Min guard assist for safety during transitions.   Transfers Overall transfer level: (P) Needs assistance Equipment used: (P) None (IV pole) Transfers: (P) Sit to/from Stand Sit to Stand: (P) Min guard         General transfer comment: Session focused on HEP education    Balance Overall balance assessment: (P) Needs assistance Sitting-balance support: (P) No upper extremity supported Sitting balance-Leahy Scale: (P) Good     Standing balance support: (P) Single extremity supported Standing balance-Leahy Scale: (P) Fair                             ADL either performed or assessed with clinical judgement   ADL Overall ADL's : Needs assistance/impaired                 Upper Body Dressing : Moderate assistance Upper Body Dressing Details (indicate cue type and reason): For sling application                   General ADL Comments: Reviewed shoulder HEP, dressing techniques, bathing techniques, and sling wear schedule with pt and her husband.     Vision   Vision Assessment?: No apparent visual deficits   Perception     Praxis      Cognition Arousal/Alertness: Lethargic;Suspect due to medications Behavior During Therapy: [facility] for tasks assessed/performed Overall Cognitive Status: Within Functional Limits for tasks assessed  Exercises Shoulder Exercises Shoulder Flexion: AAROM;Right;Supine;5 reps (able to achieve 0-10) Shoulder ABduction: AAROM;Right;5 reps;Supine (able to achieve 0-10) Shoulder External Rotation: AAROM;Right;Supine;5 reps (able to achieve neutral) Elbow Flexion: AROM;10 reps;Right;Supine Wrist Flexion: AROM;Right;10 reps;Supine Digit Composite Flexion: AROM;Right;10 reps;Supine Donning/doffing sling/immobilizer: Moderate assistance   Shoulder Instructions Shoulder Instructions Donning/doffing  sling/immobilizer: Moderate assistance     General Comments (P) O2 sats 90% on 2.5 L O2 [place] during gait and DOE 2/4.      Pertinent Vitals/ Pain       Pain Assessment: 0-10 Pain Score: 10-Worst pain ever Pain Location: R shoulder Pain Descriptors / Indicators: Aching;Operative site guarding Pain Intervention(s): Limited activity within patient's tolerance;Repositioned;Monitored during session;Ice applied;Premedicated before session  Home Living Family/patient expects to be discharged to:: (P) Skilled nursing facility [facility])                                 Additional Comments: (P) Lives with husband who uses a RW himself and is on dialysis and pt reports that he will not be able to help him at home.      Prior Functioning/Environment Level of Independence: (P) Independent            Frequency  Min 2X/week        Progress Toward Goals  OT Goals(current goals can now be found in the care plan section)  Progress towards OT goals: Progressing toward goals  Acute Rehab OT Goals Patient Stated Goal: to learn her exercises OT Goal Formulation: With patient Time For Goal Achievement: 09/30/16 Potential to Achieve Goals: Good ADL Goals Pt Will Perform Upper Body Bathing: sitting;with supervision Pt Will Perform Upper Body Dressing: with supervision;sitting Pt Will Transfer to Toilet: ambulating;bedside commode;with modified independence Pt Will Perform Toileting - Clothing Manipulation and hygiene: with modified independence;sit to/from stand Pt/caregiver will Perform Home Exercise Program: Right Upper extremity;With Supervision;With written HEP provided (active shoulder protocol HEP)  Plan Discharge plan remains appropriate    Co-evaluation                 End of Session Equipment Utilized During Treatment:  (R sling)  OT Visit Diagnosis: Pain Pain - Right/Left: Right Pain - part of body: Shoulder   Activity Tolerance Patient tolerated  treatment well   Patient Left in chair;with call bell/phone within reach   Nurse Communication Mobility status (O2 saturation 88% on 2.5 L)        Time: 1539-1600 OT Time Calculation (min): 21 min  Charges: OT General Charges $OT Visit: 1 Procedure OT Treatments $Therapeutic Exercise: 8-22 mins  Norman Herrlich, MS OTR/L  Pager: [place] A Nhung Danko 09/16/2016, 4:38 PM

## 2016-09-16 NOTE — NC FL2 (Signed)
[place] LEVEL OF CARE SCREENING TOOL     IDENTIFICATION  Patient Name: [name] Birthdate: 03-03-1941 Sex: female Admission Date (Current Location): 09/15/2016  [facility] and [place] Number:  Herbalist and Address:  [facility] [place], [place] [address], [place], [place] [address]      Provider Number: [id number]  Attending Physician Name and Address:  Netta Cedars, MD  Relative Name and Phone Number:       Current Level of Care: Hospital Recommended Level of Care: [place] Prior Approval Number:    Date Approved/Denied:   PASRR Number: [id number]  Discharge Plan: SNF    Current Diagnoses: Patient Active Problem List   Diagnosis Date Noted  . S/P shoulder replacement, right 09/15/2016  . Cough 08/10/2016  . Diabetes ([place]) 07/24/2016  . Fatigue 08/19/2015  . Change in stool 12/08/2014  . Benign paroxysmal positional vertigo 10/14/2014  . Bilateral shoulder pain 10/14/2014  . Back pain 08/27/2014  . Physical exam 08/27/2014  . Decreased hearing of both ears 05/19/2014  . Other malaise and fatigue 01/28/2014  . S/P cholecystectomy 10/02/2013  . Asthma, moderate persistent 05/16/2013  . H/O recurrent pneumonia 05/16/2013  . Asthma 05/16/2013  . Insomnia 05/05/2013  . Gluten intolerance 05/06/2012  . Nausea 03/06/2012  . Rash and nonspecific skin eruption 02/07/2012  . Renal calculus, left 01/29/2012  . Painful bladder spasm 12/14/2011  . Lactose intolerance 12/14/2011  . Post-menopausal 12/14/2011  . Microscopic colitis 11/08/2011  . Gastritis 08/31/2011  . Hepatic steatosis 08/25/2011  . Pulmonary nodule, right 08/25/2011  . Kidney stone on left side 08/25/2011  . Hematuria 08/23/2011  . Elevated BP 07/13/2011  . Skin lesion 07/13/2011  . Palpitations 12/26/2010  . Hot flashes 12/12/2010  . Memory loss 12/12/2010  . Other screening mammogram 12/12/2010  . Dizziness 09/16/2010  . SINUSITIS -  ACUTE-NOS 08/05/2010  . Diabetes mellitus type II, controlled, with no complications ([place]) 54/27/0623  . HYPERTRIGLYCERIDEMIA 07/14/2010  . DEPRESSION 07/14/2010  . GLAUCOMA 07/14/2010  . ALLERGIC RHINITIS 07/14/2010  . BRONCHITIS, CHRONIC 07/14/2010    Orientation RESPIRATION BLADDER Height & Weight     Self, Time, Situation, Place  O2 (2L) Continent Weight: 171 lb (77.6 kg) Height:     BEHAVIORAL SYMPTOMS/MOOD NEUROLOGICAL BOWEL NUTRITION STATUS      Continent Diet (Heart healthy/carb modified; thin fluids)  AMBULATORY STATUS COMMUNICATION OF NEEDS Skin   Limited Assist Verbally Normal                       Personal Care Assistance Level of Assistance  Bathing, Feeding, Dressing Bathing Assistance: Maximum assistance Feeding assistance: Limited assistance Dressing Assistance: Limited assistance     Functional Limitations Info  Sight, Hearing, Speech Sight Info: Adequate Hearing Info: Adequate Speech Info: Adequate    SPECIAL CARE FACTORS FREQUENCY  PT (By licensed PT), OT (By licensed OT)     PT Frequency: 5x OT Frequency: 5x            Contractures Contractures Info: Not present    Additional Factors Info  Code Status, Allergies, Psychotropic, Insulin Sliding Scale Code Status Info: Full Allergies Info: Papain, Papaya Derivatives, Mushroom Ext Cmplx(shiitake-reishi-mait), Venlafaxine Psychotropic Info: citalopram (CELEXA) Insulin Sliding Scale Info: See med list       Current Medications (09/16/2016):  This is the current hospital active medication list Current Facility-Administered Medications  Medication Dose Route Frequency Provider Last Rate Last Dose  .  0.9 %  sodium chloride infusion   Intravenous Continuous Netta Cedars, MD 50 mL/hr at 09/15/16 1900    . albuterol (PROVENTIL) (2.5 MG/3ML) 0.083% nebulizer solution 3 mL  3 mL Inhalation Q4H PRN Netta Cedars, MD      . atorvastatin (LIPITOR) tablet 10 mg  10 mg Oral q1800 Netta Cedars, MD   10  mg at 09/15/16 1709  . bisacodyl (DULCOLAX) suppository 10 mg  10 mg Rectal Daily PRN Netta Cedars, MD      . citalopram (CELEXA) tablet 20 mg  20 mg Oral Daily Netta Cedars, MD   20 mg at 09/16/16 1059  . diphenhydrAMINE (BENADRYL) tablet 25 mg  25 mg Oral Q6H PRN Netta Cedars, MD      . docusate sodium (COLACE) capsule 100 mg  100 mg Oral BID Netta Cedars, MD   100 mg at 09/16/16 1059  . fenofibrate tablet 160 mg  160 mg Oral Daily Netta Cedars, MD   160 mg at 09/16/16 1059  . fluticasone (FLONASE) 50 MCG/ACT nasal spray 1 spray  1 spray Each Nare Daily Netta Cedars, MD      . insulin aspart (novoLOG) injection 0-15 Units  0-15 Units Subcutaneous TID WC Netta Cedars, MD   5 Units at 09/16/16 1235  . insulin aspart (novoLOG) injection 0-5 Units  0-5 Units Subcutaneous QHS Netta Cedars, MD   3 Units at 09/15/16 2242  . insulin aspart (novoLOG) injection 4 Units  4 Units Subcutaneous TID WC Netta Cedars, MD   4 Units at 09/16/16 1235  . latanoprost (XALATAN) 0.005 % ophthalmic solution 1 drop  1 drop Both Eyes QHS Netta Cedars, MD   1 drop at 09/15/16 2145  . menthol-cetylpyridinium (CEPACOL) lozenge 3 mg  1 lozenge Oral PRN Netta Cedars, MD   3 mg at 09/15/16 1720   Or  . phenol (CHLORASEPTIC) mouth spray 1 spray  1 spray Mouth/Throat PRN Netta Cedars, MD      . metFORMIN (GLUCOPHAGE-XR) 24 hr tablet 2,000 mg  2,000 mg Oral Q breakfast Netta Cedars, MD   2,000 mg at 09/16/16 5427  . metoCLOPramide (REGLAN) tablet 5-10 mg  5-10 mg Oral Q8H PRN Netta Cedars, MD       Or  . metoCLOPramide (REGLAN) injection 5-10 mg  5-10 mg Intravenous Q8H PRN Netta Cedars, MD      . montelukast (SINGULAIR) tablet 10 mg  10 mg Oral QHS Netta Cedars, MD   10 mg at 09/15/16 2144  . morphine 2 MG/ML injection 2-3 mg  2-3 mg Intravenous Q2H PRN Netta Cedars, MD   2 mg at 09/16/16 0834  . ondansetron (ZOFRAN) tablet 4 mg  4 mg Oral Q6H PRN Netta Cedars, MD       Or  . ondansetron [facility]) injection 4 mg  4 mg  Intravenous Q6H PRN Netta Cedars, MD      . oxyCODONE (Oxy IR/ROXICODONE) immediate release tablet 5 mg  5 mg Oral Q4H PRN Netta Cedars, MD   5 mg at 09/16/16 1059  . polyethylene glycol (MIRALAX / GLYCOLAX) packet 17 g  17 g Oral Daily PRN Netta Cedars, MD      . traMADol Veatrice Bourbon) tablet 50-100 mg  50-100 mg Oral Q6H PRN Netta Cedars, MD   50 mg at 09/16/16 1238     Discharge Medications: Please see discharge summary for a list of discharge medications.  Relevant Imaging Results:  Relevant Lab Results:   Additional Information SSN: [id number]  Alena Bills  Jeanene Erb, LCSW

## 2016-09-16 NOTE — Clinical Social Work Placement (Signed)
   CLINICAL SOCIAL WORK PLACEMENT  NOTE  Date:  09/16/2016  Patient Details  Name: [name] MRN: [id number] Date of Birth: [birth year]  Clinical Social Work is seeking post-discharge placement for this patient at the [place] level of care (*CSW will initial, date and re-position this form in  chart as items are completed):  Yes   Patient/family provided with [place] Work Department's list of facilities offering this level of care within the geographic area requested by the patient (or if unable, by the patient's family).  Yes   Patient/family informed of their freedom to choose among providers that offer the needed level of care, that participate in Medicare, Medicaid or managed care program needed by the patient, have an available bed and are willing to accept the patient.  Yes   Patient/family informed of [place]'s ownership interest in [facility] and [facility], as well as of the fact that they are under no obligation to receive care at these facilities.  PASRR submitted to EDS on 09/16/16     PASRR number received on 09/16/16     Existing PASRR number confirmed on       FL2 transmitted to all facilities in geographic area requested by pt/family on 09/16/16     FL2 transmitted to all facilities within larger geographic area on       Patient informed that his/her managed care company has contracts with or will negotiate with certain facilities, including the following:            Patient/family informed of bed offers received.  Patient chooses bed at       Physician recommends and patient chooses bed at      Patient to be transferred to   on  .  Patient to be transferred to facility by       Patient family notified on   of transfer.  Name of family member notified:        PHYSICIAN       Additional Comment:    _______________________________________________ Truitt Merle, LCSW 09/16/2016, 6:29 PM

## 2016-09-17 LAB — GLUCOSE, CAPILLARY
GLUCOSE-CAPILLARY: 173 mg/dL — AB (ref 65–99)
Glucose-Capillary: 175 mg/dL — ABNORMAL HIGH (ref 65–99)
Glucose-Capillary: 202 mg/dL — ABNORMAL HIGH (ref 65–99)
Glucose-Capillary: 188 mg/dL — ABNORMAL HIGH (ref 65–99)

## 2016-09-17 NOTE — Progress Notes (Signed)
  Patient Details:  [name] March 21, 1941 [id number]   Orthopedics Progress Note  Subjective: The patient is comfortable in bed. She was up and had a shower this morning.  She is tolerating food with no difficulty. She has had a bowel movement and has passed flatus. Pain is well-controlled. She states her pain is mild to moderate.   Objective:  Vitals:   09/16/16 1548 09/16/16 2126  BP: (!) 129/57 (!) 145/67  Pulse: 77 86  Resp: 16 16  Temp: 98.9 F (37.2 C) 99.5 F (37.5 C)    General:  Alert and oriented x 3.  Right shoulder dressing clean, dry and intact Incision of the right shoulder healing well with no erythema or drainage. Grossly neurovascularly intact. Pulses 2+ bilaterally. Capillary refill less than 2 seconds in the right hand. Sensation intact to light touch. Able to wiggle fingers, make a fist, cross fingers, and abduct thumb with no difficulty.  Lab Results  Component Value Date   WBC 7.1 09/08/2016   HGB 10.5 (L) 09/16/2016   HCT 32.1 (L) 09/16/2016   MCV 87.6 09/08/2016   PLT 283 09/08/2016       Component Value Date/Time   NA 137 09/16/2016 0409   K 3.8 09/16/2016 0409   CL 103 09/16/2016 0409   CO2 26 09/16/2016 0409   GLUCOSE 186 (H) 09/16/2016 0409   BUN 13 09/16/2016 0409   CREATININE 0.77 09/16/2016 0409   CALCIUM 8.7 (L) 09/16/2016 0409   GFRNONAA >60 09/16/2016 0409   GFRAA >60 09/16/2016 0409    No results found for: INR, PROTIME  Assessment/Plan: POD #1 s/p Procedure(s): RIGHT REVERSE SHOULDER ARTHROPLASTY Continue PT/OT. Continue pain management as needed.  Continue with D/C planning.  Advance activity as tolerated.    Brynda Peon 09/17/2016, 9:44 AM

## 2016-09-17 NOTE — Evaluation (Signed)
Occupational Therapy Evaluation Patient Details Name: [name] MRN: [id number] DOB: May 22, 1941 Today's Date: 09/17/2016    History of Present Illness Pt is a 76 y.o. female s/p R reverse total shoulder arthroplasty. She has a PMH significant for asthma, diabetes mellitus, glaucoma, hyperlipidemia, migraines, and palpitations.   Clinical Impression   Pt progressing toward OT goals. Able to complete HEP with verbal and visual cues this session. She did require increased assistance of min assist for toilet transfers and functional mobility this session as she was unsteady while ambulating and reported "tightness" in her knees. Pt more alert this session and able to verbalize shoulder precautions and adhere to these during ADL. OT will continue to follow acutely. D/C plan remains appropriate.    Follow Up Recommendations  SNF;Supervision/Assistance - 24 hour    Equipment Recommendations  Other (comment) (defer to next venue of care)    Recommendations for Other Services       Precautions / Restrictions Precautions Precautions: Shoulder Type of Shoulder Precautions: Active protocol: sling for comfort and sleep, NWB R UE, elbow/wrist/hand AROM, AROM/PROM R shoulder (forward flexion: 0-90, abduction: 0-60, external rotation: 0-30). Shoulder Interventions: Shoulder sling/immobilizer;For comfort;Off for dressing/bathing/exercises Precaution Booklet Issued: Yes (comment) Precaution Comments: Educated pt on shoulder precautions during ADL with handout provided. Additionally educated pt concerning active protocol HEP with written instructions provided. Required Braces or Orthoses: Sling Restrictions Weight Bearing Restrictions: Yes RUE Weight Bearing: Non weight bearing      Mobility Bed Mobility Overal bed mobility: Needs Assistance Bed Mobility: Rolling;Sidelying to Sit Rolling: Min guard Sidelying to sit: Min assist;HOB elevated       General bed mobility comments: HOB slightly  elevated. Pt with increased reliance on railing.   Transfers Overall transfer level: Needs assistance Equipment used: None Transfers: Sit to/from Stand Sit to Stand: Min guard         General transfer comment: Pt highly motivated to complete transfer without physical assistance.    Balance Overall balance assessment: Needs assistance Sitting-balance support: No upper extremity supported Sitting balance-Leahy Scale: Good     Standing balance support: Single extremity supported Standing balance-Leahy Scale: Fair                             ADL either performed or assessed with clinical judgement   ADL Overall ADL's : Needs assistance/impaired                 Upper Body Dressing : Moderate assistance;Standing Upper Body Dressing Details (indicate cue type and reason): For sling application     Toilet Transfer: Minimal assistance;Ambulation;BSC Toilet Transfer Details (indicate cue type and reason): Decreased stability today; pt reporting knees feeling "tight"         Functional mobility during ADLs: Minimal assistance General ADL Comments: Continued education concerning active shoulder protocol precautions and HEP. Pt required verbal and visual cues to complete exercises.     Vision   Vision Assessment?: No apparent visual deficits     Perception     Praxis      Pertinent Vitals/Pain Pain Assessment: Faces Faces Pain Scale: Hurts even more Pain Location: R shoulder Pain Descriptors / Indicators: Aching;Operative site guarding Pain Intervention(s): Limited activity within patient's tolerance;Repositioned;Monitored during session     Hand Dominance     Extremity/Trunk Assessment             Communication     Cognition Arousal/Alertness: Awake/alert Behavior During Therapy: [facility]  for tasks assessed/performed Overall Cognitive Status: Within Functional Limits for tasks assessed                                      General Comments       Exercises Exercises: Shoulder Shoulder Exercises Shoulder Flexion: AAROM;Right;10 reps;Supine (by 9th and 10th repetition able to achieve 0-15) Shoulder ABduction: AAROM;Right;10 reps;Seated (able to achieve 0-10) Shoulder External Rotation: AAROM;Right;10 reps;Supine (able to achieve -10 from neutral) Elbow Flexion: AROM;10 reps;Right;Standing Wrist Flexion: AROM;Right;10 reps;Seated Digit Composite Flexion: AROM;Right;10 reps;Seated Donning/doffing sling/immobilizer: Moderate assistance   Shoulder Instructions Shoulder Instructions Donning/doffing sling/immobilizer: Moderate assistance    Home Living                                          Prior Functioning/Environment                   OT Problem List:        OT Treatment/Interventions:      OT Goals(Current goals can be found in the care plan section) Acute Rehab OT Goals Patient Stated Goal: to learn her exercises OT Goal Formulation: With patient Time For Goal Achievement: 09/30/16 Potential to Achieve Goals: Good ADL Goals Pt Will Perform Upper Body Bathing: sitting;with supervision Pt Will Perform Upper Body Dressing: with supervision;sitting Pt Will Transfer to Toilet: ambulating;bedside commode;with modified independence Pt Will Perform Toileting - Clothing Manipulation and hygiene: with modified independence;sit to/from stand Pt/caregiver will Perform Home Exercise Program: Right Upper extremity;With Supervision;With written HEP provided (active shoulder protocol HEP)  OT Frequency: Min 2X/week   Barriers to D/C:            Co-evaluation              End of Session Equipment Utilized During Treatment: Gait belt (R sling) Nurse Communication: Mobility status  Activity Tolerance: Patient tolerated treatment well Patient left: in chair;with call bell/phone within reach  OT Visit Diagnosis: Pain Pain - Right/Left: Right Pain - part of body: Shoulder                 Time: 1015-1050 OT Time Calculation (min): 35 min Charges:  OT General Charges $OT Visit: 1 Procedure OT Treatments $Self Care/Home Management : 8-22 mins $Therapeutic Exercise: 8-22 mins G-Codes:     Norman Herrlich, MS OTR/L  Pager: [place] A [name] 09/17/2016, 11:20 AM

## 2016-09-18 ENCOUNTER — Encounter (HOSPITAL_COMMUNITY): Payer: Self-pay | Admitting: Orthopedic Surgery

## 2016-09-18 DIAGNOSIS — J209 Acute bronchitis, unspecified: Secondary | ICD-10-CM | POA: Diagnosis not present

## 2016-09-18 DIAGNOSIS — M19011 Primary osteoarthritis, right shoulder: Secondary | ICD-10-CM | POA: Diagnosis not present

## 2016-09-18 DIAGNOSIS — R531 Weakness: Secondary | ICD-10-CM | POA: Diagnosis not present

## 2016-09-18 DIAGNOSIS — M25519 Pain in unspecified shoulder: Secondary | ICD-10-CM | POA: Diagnosis not present

## 2016-09-18 DIAGNOSIS — R0602 Shortness of breath: Secondary | ICD-10-CM | POA: Diagnosis not present

## 2016-09-18 DIAGNOSIS — Z96611 Presence of right artificial shoulder joint: Secondary | ICD-10-CM | POA: Diagnosis not present

## 2016-09-18 DIAGNOSIS — Z471 Aftercare following joint replacement surgery: Secondary | ICD-10-CM | POA: Diagnosis not present

## 2016-09-18 DIAGNOSIS — R2681 Unsteadiness on feet: Secondary | ICD-10-CM | POA: Diagnosis not present

## 2016-09-18 DIAGNOSIS — Z09 Encounter for follow-up examination after completed treatment for conditions other than malignant neoplasm: Secondary | ICD-10-CM | POA: Diagnosis not present

## 2016-09-18 DIAGNOSIS — E785 Hyperlipidemia, unspecified: Secondary | ICD-10-CM | POA: Diagnosis not present

## 2016-09-18 DIAGNOSIS — R278 Other lack of coordination: Secondary | ICD-10-CM | POA: Diagnosis not present

## 2016-09-18 DIAGNOSIS — E119 Type 2 diabetes mellitus without complications: Secondary | ICD-10-CM | POA: Diagnosis not present

## 2016-09-18 DIAGNOSIS — M6281 Muscle weakness (generalized): Secondary | ICD-10-CM | POA: Diagnosis not present

## 2016-09-18 DIAGNOSIS — F339 Major depressive disorder, recurrent, unspecified: Secondary | ICD-10-CM | POA: Diagnosis not present

## 2016-09-18 LAB — GLUCOSE, CAPILLARY
Glucose-Capillary: 134 mg/dL — ABNORMAL HIGH (ref 65–99)
Glucose-Capillary: 139 mg/dL — ABNORMAL HIGH (ref 65–99)

## 2016-09-18 NOTE — Clinical Social Work Note (Signed)
Clinical Social Worker facilitated patient discharge including contacting patient family and facility to confirm patient discharge plans.  Clinical information faxed to facility and family agreeable with plan.  CSW arranged ambulance transport via [facility] to [facility].  RN to call [phone] for report prior to discharge.  Clinical Social Worker will sign off for now as social work intervention is no longer needed. Please consult [place] again if new need arises.  [address] [place]

## 2016-09-18 NOTE — Op Note (Signed)
NAMEJonelle Griffin.:  MEDICAL RECORD NO.:  [id number]  LOCATION:                                 FACILITY:  PHYSICIAN:  Doran Heater. Veverly Fells, M.D.      DATE OF BIRTH:  DATE OF PROCEDURE:  09/15/2016 DATE OF DISCHARGE:                              OPERATIVE REPORT   PREOPERATIVE DIAGNOSIS:  Right shoulder rotator cuff tear arthropathy.  POSTOPERATIVE DIAGNOSIS:  Right shoulder rotator cuff tear arthropathy.  PROCEDURE PERFORMED:  Right reverse total shoulder arthroplasty using DePuy Delta Xtend prosthesis.  ATTENDING SURGEON:  Doran Heater. Veverly Fells, M.D.  ASSISTANT:  Ventura Bruns, PA-C, who scrubbed the entire procedure and necessary for satisfactory completion of surgery.  ANESTHESIA:  General anesthesia was used plus interscalene block.  ESTIMATED BLOOD LOSS:  Minimal.  FLUID REPLACEMENT:  1200 mL crystalloid.  COUNTS:  Instrument counts were correct.  COMPLICATIONS:  There were no complications.  ANTIBIOTICS:  Perioperative antibiotics were given.  INDICATIONS:  The patient is a 76 year old female with worsening right shoulder pain secondary to rotator cuff tear arthropathy.  The patient has had progressive pain despite conservative management and desires operative treatment to restore function and eliminate pain.  Informed consent obtained.  DESCRIPTION OF PROCEDURE:  After an adequate level of anesthesia achieved, the patient was positioned in the modified beach-chair position.  Right shoulder was correctly identified and examined under anesthesia, forward elevation 90, abduction was about 70, external rotation 30, internal rotation to her abdomen.  After sterile prep and drape of the arm and shoulder, we called our time-out.  We then initiated standard deltopectoral approach starting at the coracoid process and extending down to the anterior humerus with a #10 blade scalpel, dissection down through the subcutaneous tissues with  the Bovie, identified the cephalic vein, took it laterally with the deltoid, pectoralis taken medially.  Conjoint tendon identified and retracted medially.  We identified the biceps tendon, which was still intact.  We went ahead and tenodesed that in situ whipstitching with 0 Vicryl suture into the insertion on the humerus.  We then released the subscapularis remnant off the lesser tuberosity and tagged that for protection of the axillary nerve.  We externally rotated the humerus and progressively released the capsule off the inferior humeral neck.  We then extended the shoulder, divided the biceps tendon and the anterior remnant of the rotator cuff, was diminutive and torn.  We removed all that tissue to make sure would impinge.  With the shoulder extended, we delivered the head out of the wound.  We entered the proximal humerus with a 6-mm reamer and reamed up to size 12.  We then placed our 12-mm intramedullary guide and resected the head using the head resection guide 10 degrees of retroversion.  We then removed the excess osteophytes off the medial head of the humerus.  Next, we went ahead and milled for the metaphyseal component, this was epi-1 right.  Once we had that milled, then we went ahead and impacted the trial in position, so 12-body epi-1 right metaphyseal component placed on the 0 setting in 10 degrees  of retroversion.  We then reduced the shoulder, subluxed the humerus posteriorly, did a 360-degree capsular removal and glenoid labrum removal protecting the axillary nerve during this part of the procedure.  We had full exposure of the glenoid face.  There was advanced wear of the glenohumeral joint cartilage, placed our central guidepin after removing the remaining cartilage with a Cobb elevator and reamed for our baseplate or the metaglene.  Once we had that reaming performed, then, we drilled our central PEG hole, we impacted the metaglene into position.  We then placed  a 30 screw inferiorly, a 36 into the base of the coracoid and then 18 screw anteriorly.  With those screws in position, we had good security at the baseplate.  We then took a 38 standard glenosphere and screwed that down onto the baseplate and impacted that and screwed again.  Once it was stable, we checked our axillary nerve, it was still in good shape and now out of the zone of the surgery here.  At this point, we reduced the shoulder with a 38+ 3 trial poly.  We were happy with the soft tissue balancing.  There was nice little pop, that was reduced.  Removed all the components from the humeral side, irrigated thoroughly.  We then used impaction grafting bone grafting technique using available bone graft from the humeral head and used a HA-coated press-fit 12-body epi-1 right metaphysis, set on the 0 setting, placed in 10 degrees of retroversion, impacted that in position.  We were pleased with our location and position.  We then went ahead and selected the real 38+ 3 poly, impacted that in position and reduced the shoulder, again nice little pop and nice tight conjoined tendon.  Axillary nerve not overly tight, no gapping with progressive external rotation or inferior pole.  We irrigated thoroughly and closed in the following layers; 0 Vicryl suture for the deltopectoral incision, 2-0 Vicryl subcutaneous closure and 4-0 Monocryl for skin.  Steri-Strips applied followed by sterile dressing.  The patient tolerated the surgery well.     Doran Heater. Veverly Fells, M.D.     SRN/MEDQ  D:  09/15/2016  T:  09/15/2016  Job:  [id number]

## 2016-09-18 NOTE — Discharge Summary (Signed)
Physician Discharge Summary   Patient ID: [name] MRN: [id number] DOB/AGE: 77/29/1942 77 y.o.  Admit date: 09/15/2016 Discharge date: 09/18/2016  Admission Diagnoses:  Active Problems:   S/P shoulder replacement, right   Discharge Diagnoses:  Same   Surgeries: Procedure(s): RIGHT REVERSE SHOULDER ARTHROPLASTY on 09/15/2016   Consultants: PT/OT  Discharged Condition: Stable  Hospital Course: TAMECKA MILHAM is an 76 y.o. female who was admitted 09/15/2016 with a chief complaint of right shoulder pain, and found to have a diagnosis of right shoulder cuff arthropathy.  They were brought to the operating room on 09/15/2016 and underwent the above named procedures.    The patient had an uncomplicated hospital course and was stable for discharge.  Recent vital signs:  Vitals:   09/17/16 2026 09/18/16 0714  BP: (!) 120/48 (!) 137/52  Pulse: 90 81  Resp: 16 16  Temp: 98.8 F (37.1 C) 97.9 F (36.6 C)    Recent laboratory studies:  Results for orders placed or performed during the hospital encounter of 09/15/16  Glucose, capillary  Result Value Ref Range   Glucose-Capillary 181 (H) 65 - 99 mg/dL   Comment 1 Notify RN    Comment 2 Document in Chart   Glucose, capillary  Result Value Ref Range   Glucose-Capillary 165 (H) 65 - 99 mg/dL  Hemoglobin and hematocrit, blood  Result Value Ref Range   Hemoglobin 10.5 (L) 12.0 - 15.0 g/dL   HCT 32.1 (L) 36.0 - 23.5 %  Basic metabolic panel  Result Value Ref Range   Sodium 137 135 - 145 mmol/L   Potassium 3.8 3.5 - 5.1 mmol/L   Chloride 103 101 - 111 mmol/L   CO2 26 22 - 32 mmol/L   Glucose, Bld 186 (H) 65 - 99 mg/dL   BUN 13 6 - 20 mg/dL   Creatinine, Ser 0.77 0.44 - 1.00 mg/dL   Calcium 8.7 (L) 8.9 - 10.3 mg/dL   GFR calc non Af Amer >60 >60 mL/min   GFR calc Af Amer >60 >60 mL/min   Anion gap 8 5 - 15  Glucose, capillary  Result Value Ref Range   Glucose-Capillary 226 (H) 65 - 99 mg/dL   Comment 1 Notify RN    Comment 2 Document in Chart   Glucose, capillary  Result Value Ref Range   Glucose-Capillary 288 (H) 65 - 99 mg/dL  Glucose, capillary  Result Value Ref Range   Glucose-Capillary 201 (H) 65 - 99 mg/dL  Glucose, capillary  Result Value Ref Range   Glucose-Capillary 212 (H) 65 - 99 mg/dL  Glucose, capillary  Result Value Ref Range   Glucose-Capillary 170 (H) 65 - 99 mg/dL  Glucose, capillary  Result Value Ref Range   Glucose-Capillary 196 (H) 65 - 99 mg/dL  Glucose, capillary  Result Value Ref Range   Glucose-Capillary 175 (H) 65 - 99 mg/dL  Glucose, capillary  Result Value Ref Range   Glucose-Capillary 202 (H) 65 - 99 mg/dL  Glucose, capillary  Result Value Ref Range   Glucose-Capillary 173 (H) 65 - 99 mg/dL  Glucose, capillary  Result Value Ref Range   Glucose-Capillary 188 (H) 65 - 99 mg/dL  Glucose, capillary  Result Value Ref Range   Glucose-Capillary 134 (H) 65 - 99 mg/dL  Glucose, capillary  Result Value Ref Range   Glucose-Capillary 139 (H) 65 - 99 mg/dL    Discharge Medications:   Allergies as of 09/18/2016      Reactions   Papain Anaphylaxis  Takes an extreme concentration   Papaya Derivatives Anaphylaxis   Mushroom Ext Cmplx(shiitake-reishi-mait) Other (See Comments)   Head congestion and sore throat   Venlafaxine Other (See Comments)   UNSPECIFIED REACTION  Pt states she can take Name brand. Lethargy      Medication List    STOP taking these medications   cetirizine 10 MG tablet Commonly known as:  ZYRTEC     TAKE these medications   albuterol 108 (90 Base) MCG/ACT inhaler Commonly known as:  PROAIR HFA Inhale 2 puffs into the lungs every 4 (four) hours as needed for wheezing or shortness of breath.   ALLERGY PO Take 1 tablet by mouth daily.   atorvastatin 20 MG tablet Commonly known as:  LIPITOR TAKE ONE (1) TABLET EACH DAY   Biotin 5000 MCG Caps Take 1 capsule by mouth daily.   citalopram 40 MG tablet Commonly known as:  CELEXA TAKE  ONE (1) TABLET EACH DAY   fenofibrate 145 MG tablet Commonly known as:  TRICOR TAKE ONE (1) TABLET EACH DAY AFTER BREKFAST What changed:  how much to take  how to take this  when to take this  additional instructions   fluticasone 50 MCG/ACT nasal spray Commonly known as:  FLONASE Place 1 spray into both nostrils daily.   LUMIGAN 0.01 % Soln Generic drug:  bimatoprost Place 1 drop into both eyes at bedtime.   metFORMIN 500 MG 24 hr tablet Commonly known as:  GLUCOPHAGE-XR Take 4 tablets (2,000 mg total) by mouth daily with breakfast. What changed:  additional instructions   montelukast 10 MG tablet Commonly known as:  SINGULAIR Take 1 tablet (10 mg total) by mouth at bedtime.   ONE TOUCH ULTRA TEST test strip Generic drug:  glucose blood USE 1 TIME A DAY   ONETOUCH DELICA LANCETS 69I Misc Use one lancet to test sugars once daily. E11.9   oxyCODONE 5 MG immediate release tablet Commonly known as:  Oxy IR/ROXICODONE Take 1 tablet (5 mg total) by mouth every 4 (four) hours as needed for severe pain.   traMADol 50 MG tablet Commonly known as:  ULTRAM Take 1-2 tablets (50-100 mg total) by mouth every 6 (six) hours as needed for moderate pain.       Diagnostic Studies: Dg Shoulder Right Port  Result Date: 09/15/2016 CLINICAL DATA:  Status post right shoulder arthroplasty. EXAM: PORTABLE RIGHT SHOULDER COMPARISON:  None. FINDINGS: The glenoid and humeral components appear to be well situated. No fracture or dislocation is noted. Visualized ribs appear normal. IMPRESSION: Status post right shoulder arthroplasty. Electronically Signed   By: Marijo Conception, M.D.   On: 09/15/2016 13:34    Disposition: 01-Home or Self Care  Discharge Instructions    Call MD / Call 911    Complete by:  As directed    If you experience chest pain or shortness of breath, CALL 911 and be transported to the hospital emergency room.  If you develope a fever above 101 F, pus (white drainage)  or increased drainage or redness at the wound, or calf pain, call your surgeon's office.   Constipation Prevention    Complete by:  As directed    Drink plenty of fluids.  Prune juice may be helpful.  You may use a stool softener, such as Colace (over the counter) 100 mg twice a day.  Use MiraLax (over the counter) for constipation as needed.   Diet - low sodium heart healthy    Complete by:  As directed    Increase activity slowly as tolerated    Complete by:  As directed       Follow-up Information    NORRIS,STEVEN R, MD. Call in 2 weeks.   Specialty:  Orthopedic Surgery Why:  [phone] Contact information: [address] [place] [address] [phone]            Signed: Ventura Bruns 09/18/2016, 12:13 PM

## 2016-09-18 NOTE — Anesthesia Postprocedure Evaluation (Signed)
Anesthesia Post Note  Patient: [name]  Procedure(s) Performed: Procedure(s) (LRB): RIGHT REVERSE SHOULDER ARTHROPLASTY (Right)  Patient location during evaluation: PACU Anesthesia Type: Regional and General Level of consciousness: awake and alert Pain management: pain level controlled Vital Signs Assessment: post-procedure vital signs reviewed and stable Respiratory status: spontaneous breathing, nonlabored ventilation and respiratory function stable Cardiovascular status: blood pressure returned to baseline and stable Postop Assessment: no signs of nausea or vomiting Anesthetic complications: no       Last Vitals:  Vitals:   09/17/16 2026 09/18/16 0714  BP: (!) 120/48 (!) 137/52  Pulse: 90 81  Resp: 16 16  Temp: 37.1 C 36.6 C    Last Pain:  Vitals:   09/18/16 0714  TempSrc: Oral  PainSc:                  Lynda Rainwater

## 2016-09-18 NOTE — Progress Notes (Signed)
   Subjective: 3 Days Post-Op Procedure(s) (LRB): RIGHT REVERSE SHOULDER ARTHROPLASTY (Right)  Pt doing well Ready for d/c to SNF  Denies any new symptoms or issues Patient reports pain as mild.  Objective:   VITALS:   Vitals:   09/17/16 2026 09/18/16 0714  BP: (!) 120/48 (!) 137/52  Pulse: 90 81  Resp: 16 16  Temp: 98.8 F (37.1 C) 97.9 F (36.6 C)    Right shoulder incision healing well nv intact distally No rashes or drainage  LABS  Recent Labs  09/16/16 0409  HGB 10.5*  HCT 32.1*     Recent Labs  09/16/16 0409  NA 137  K 3.8  BUN 13  CREATININE 0.77  GLUCOSE 186*     Assessment/Plan: 3 Days Post-Op Procedure(s) (LRB): RIGHT REVERSE SHOULDER ARTHROPLASTY (Right) D/c to SNF today f/u in 2 weeks Activity as tolerated    Merla Riches, MPAS, PA-C  09/18/2016, 12:11 PM

## 2016-09-18 NOTE — Progress Notes (Signed)
qPhysical Therapy Treatment Patient Details Name: [name] MRN: [id number] DOB: Nov 15, 1940 Today's Date: 09/18/2016    History of Present Illness Pt is a 76 y.o. female s/p R reverse total shoulder arthroplasty. She has a PMH significant for asthma, diabetes mellitus, glaucoma, hyperlipidemia, migraines, and palpitations.    PT Comments    Patient is making good progress with PT.  From a mobility standpoint anticipate patient will be ready for DC to SNF in her [facility] community. Pt bed mobility and transfers are supervision and she is able to ambulate 600 feet with no DME and min guard assist. Pt requires continued skilled PT to maintain LE strength and to improve endurance in gait to be safe in her environment.       Follow Up Recommendations  SNF     Equipment Recommendations  None recommended by PT    Recommendations for Other Services       Precautions / Restrictions Precautions Precautions: Shoulder Type of Shoulder Precautions: Active protocol: sling for comfort and sleep, NWB R UE, elbow/wrist/hand AROM, AROM/PROM R shoulder (forward flexion: 0-90, abduction: 0-60, external rotation: 0-30). Shoulder Interventions: Shoulder sling/immobilizer;For comfort;Off for dressing/bathing/exercises Required Braces or Orthoses: Sling Restrictions RUE Weight Bearing: Non weight bearing    Mobility  Bed Mobility Overal bed mobility: Needs Assistance Bed Mobility: Rolling;Sidelying to Sit Rolling: Supervision Sidelying to sit: HOB elevated;Supervision       General bed mobility comments: HOB slightly elevated, pt rolled to left shoulder and pushed up on left arm.    Transfers Overall transfer level: Needs assistance Equipment used: None (IV pole) Transfers: Sit to/from Stand Sit to Stand: Supervision         General transfer comment: Supervision for transfer, PT offered [facility] but pt refused stating that she felt very stable today  Ambulation/Gait Ambulation/Gait  assistance: Min guard Ambulation Distance (Feet): 600 Feet Assistive device: None Gait Pattern/deviations: Step-through pattern;Staggering right;Staggering left;WFL(Within Functional Limits) Gait velocity: decreased Gait velocity interpretation: Below normal speed for age/gender General Gait Details: Pt with steady gait, able to carry on conversation with staff in hall while walking. Pt with minor SOB by the time sher returned to her room      Balance Overall balance assessment: Needs assistance Sitting-balance support: No upper extremity supported Sitting balance-Leahy Scale: Good     Standing balance support: No upper extremity supported Standing balance-Leahy Scale: Good                              Cognition Arousal/Alertness: Awake/alert Behavior During Therapy: WFL for tasks assessed/performed Overall Cognitive Status: Within Functional Limits for tasks assessed                                        Exercises General Exercises - Lower Extremity Ankle Circles/Pumps: AROM;Both;10 reps;Seated Quad Sets: AROM;Both;10 reps;Seated Long Arc Quad: AROM;Both;10 reps;Seated Hip ABduction/ADduction: AROM;Both;10 reps;Seated Hip Flexion/Marching: AROM;Both;10 reps;Seated    General Comments General comments (skin integrity, edema, etc.): Pt cued that it was not neccessary to hold her R arm with her L arm that the sling was properly adjusted and should safely hold L UE in place.      Pertinent Vitals/Pain Pain Assessment: 0-10 Pain Score: 0-No pain Pain Intervention(s): Monitored during session  VSS           PT Goals (current goals can  now be found in the care plan section) Acute Rehab PT Goals Patient Stated Goal: to go to rehab before going home.  PT Goal Formulation: With patient/family Time For Goal Achievement: 09/23/16 Potential to Achieve Goals: Good Progress towards PT goals: Progressing toward goals    Frequency    Min  3X/week      PT Plan Current plan remains appropriate       End of Session Equipment Utilized During Treatment: Gait belt;Other (comment) (R arm sling) Activity Tolerance: Patient tolerated treatment well Patient left: in chair;with call bell/phone within reach Nurse Communication: Mobility status PT Visit Diagnosis: Muscle weakness (generalized) (M62.81);Unsteadiness on feet (R26.81);Pain Pain - Right/Left: Right Pain - part of body: Shoulder     Time: 2518-9842 PT Time Calculation (min) (ACUTE ONLY): 25 min  Charges:  $Gait Training: 8-22 mins $Therapeutic Exercise: 8-22 mins                    G Codes:       Sandee Bernath B. Migdalia Dk PT, DPT Acute Rehabilitation  [phone] Pager [phone]    [place] 09/18/2016, 9:55 AM

## 2016-09-18 NOTE — Progress Notes (Signed)
Report called to [facility] RN. All questions answered and, denies any other questions. [name] states that prescriptions will be taken care of by another pharmacy since clinic closes at 1500.

## 2016-09-18 NOTE — Progress Notes (Signed)
Occupational Therapy Treatment Patient Details Name: [name] MRN: [id number] DOB: 11/13/1940 Today's Date: 09/18/2016    History of present illness Pt is a 76 y.o. female s/p R reverse total shoulder arthroplasty. She has a PMH significant for asthma, diabetes mellitus, glaucoma, hyperlipidemia, migraines, and palpitations.   OT comments  Pt progressing towards acute OT goals. Focus of session was RUE active protocol ROM exercises. Session details below. D/c plan to SNF remains appropriate.    Follow Up Recommendations    SNF   Equipment Recommendations    defer to next venue   Recommendations for Other Services      Precautions / Restrictions Precautions Precautions: Shoulder Type of Shoulder Precautions: Active protocol: sling for comfort and sleep, NWB R UE, elbow/wrist/hand AROM, AROM/PROM R shoulder (forward flexion: 0-90, abduction: 0-60, external rotation: 0-30). Shoulder Interventions: Shoulder sling/immobilizer;For comfort;Off for dressing/bathing/exercises Required Braces or Orthoses: Sling Restrictions Weight Bearing Restrictions: Yes RUE Weight Bearing: Non weight bearing       Mobility Bed Mobility                  Transfers                      Balance                                           ADL either performed or assessed with clinical judgement   ADL                                               Vision       Perception     Praxis      Cognition Arousal/Alertness: Awake/alert Behavior During Therapy: WFL for tasks assessed/performed Overall Cognitive Status: Within Functional Limits for tasks assessed                                          Exercises Shoulder Exercises Shoulder Flexion: AAROM;Right;Supine;5 reps Shoulder ABduction: AAROM;5 reps;Supine Elbow Flexion: AROM;10 reps;Right;Supine Elbow Extension: AROM;Right;10 reps;Supine Correct positioning of  sling/immobilizer: Minimal assistance ROM for elbow, wrist and digits of operated UE: Supervision/safety Sling wearing schedule (on at all times/off for ADL's): Supervision/safety Positioning of UE while sleeping: Minimal assistance   Shoulder Instructions Shoulder Instructions Correct positioning of sling/immobilizer: Minimal assistance ROM for elbow, wrist and digits of operated UE: Supervision/safety Sling wearing schedule (on at all times/off for ADL's): Supervision/safety Positioning of UE while sleeping: Minimal assistance     General Comments      Pertinent Vitals/ Pain       Pain Assessment: Faces Faces Pain Scale: Hurts even more Pain Location: R shoulder Pain Descriptors / Indicators: Aching;Operative site guarding Pain Intervention(s): Limited activity within patient's tolerance;Monitored during session;Repositioned  Home Living                                          Prior Functioning/Environment              Frequency  Progress Toward Goals  OT Goals(current goals can now be found in the care plan section)  Progress towards OT goals: Progressing toward goals  Acute Rehab OT Goals Patient Stated Goal: to go to rehab before going home.  OT Goal Formulation: With patient Time For Goal Achievement: 09/30/16 Potential to Achieve Goals: Good ADL Goals Pt Will Perform Upper Body Bathing: sitting;with supervision Pt Will Perform Upper Body Dressing: with supervision;sitting Pt Will Transfer to Toilet: ambulating;bedside commode;with modified independence Pt Will Perform Toileting - Clothing Manipulation and hygiene: with modified independence;sit to/from stand Pt/caregiver will Perform Home Exercise Program: Right Upper extremity;With Supervision;With written HEP provided  Plan      Co-evaluation                 End of Session        Activity Tolerance     Patient Left     Nurse Communication           Time: 7366-8159 OT Time Calculation (min): 22 min  Charges: OT General Charges $OT Visit: 1 Procedure OT Treatments $Therapeutic Exercise: 8-22 mins     Hortencia Pilar 09/18/2016, 2:38 PM

## 2016-09-19 DIAGNOSIS — E119 Type 2 diabetes mellitus without complications: Secondary | ICD-10-CM | POA: Diagnosis not present

## 2016-09-19 DIAGNOSIS — E785 Hyperlipidemia, unspecified: Secondary | ICD-10-CM | POA: Diagnosis not present

## 2016-09-19 DIAGNOSIS — Z471 Aftercare following joint replacement surgery: Secondary | ICD-10-CM | POA: Diagnosis not present

## 2016-09-19 DIAGNOSIS — F339 Major depressive disorder, recurrent, unspecified: Secondary | ICD-10-CM | POA: Diagnosis not present

## 2016-09-19 DIAGNOSIS — M19011 Primary osteoarthritis, right shoulder: Secondary | ICD-10-CM | POA: Diagnosis not present

## 2016-09-25 DIAGNOSIS — J209 Acute bronchitis, unspecified: Secondary | ICD-10-CM | POA: Diagnosis not present

## 2016-09-26 DIAGNOSIS — M19011 Primary osteoarthritis, right shoulder: Secondary | ICD-10-CM | POA: Diagnosis not present

## 2016-09-26 DIAGNOSIS — J209 Acute bronchitis, unspecified: Secondary | ICD-10-CM | POA: Diagnosis not present

## 2016-09-26 DIAGNOSIS — Z471 Aftercare following joint replacement surgery: Secondary | ICD-10-CM | POA: Diagnosis not present

## 2016-10-02 DIAGNOSIS — Z471 Aftercare following joint replacement surgery: Secondary | ICD-10-CM | POA: Diagnosis not present

## 2016-10-02 DIAGNOSIS — Z96611 Presence of right artificial shoulder joint: Secondary | ICD-10-CM | POA: Diagnosis not present

## 2016-10-02 DIAGNOSIS — R278 Other lack of coordination: Secondary | ICD-10-CM | POA: Diagnosis not present

## 2016-10-02 DIAGNOSIS — M6281 Muscle weakness (generalized): Secondary | ICD-10-CM | POA: Diagnosis not present

## 2016-10-04 DIAGNOSIS — M6281 Muscle weakness (generalized): Secondary | ICD-10-CM | POA: Diagnosis not present

## 2016-10-04 DIAGNOSIS — Z96611 Presence of right artificial shoulder joint: Secondary | ICD-10-CM | POA: Diagnosis not present

## 2016-10-04 DIAGNOSIS — R278 Other lack of coordination: Secondary | ICD-10-CM | POA: Diagnosis not present

## 2016-10-04 DIAGNOSIS — Z471 Aftercare following joint replacement surgery: Secondary | ICD-10-CM | POA: Diagnosis not present

## 2016-10-06 DIAGNOSIS — J209 Acute bronchitis, unspecified: Secondary | ICD-10-CM | POA: Diagnosis not present

## 2016-10-10 ENCOUNTER — Ambulatory Visit: Payer: Medicare Other | Admitting: Pulmonary Disease

## 2016-10-11 DIAGNOSIS — Z471 Aftercare following joint replacement surgery: Secondary | ICD-10-CM | POA: Diagnosis not present

## 2016-10-11 DIAGNOSIS — R278 Other lack of coordination: Secondary | ICD-10-CM | POA: Diagnosis not present

## 2016-10-11 DIAGNOSIS — Z96611 Presence of right artificial shoulder joint: Secondary | ICD-10-CM | POA: Diagnosis not present

## 2016-10-11 DIAGNOSIS — M6281 Muscle weakness (generalized): Secondary | ICD-10-CM | POA: Diagnosis not present

## 2016-10-16 DIAGNOSIS — R278 Other lack of coordination: Secondary | ICD-10-CM | POA: Diagnosis not present

## 2016-10-16 DIAGNOSIS — Z471 Aftercare following joint replacement surgery: Secondary | ICD-10-CM | POA: Diagnosis not present

## 2016-10-16 DIAGNOSIS — Z96611 Presence of right artificial shoulder joint: Secondary | ICD-10-CM | POA: Diagnosis not present

## 2016-10-16 DIAGNOSIS — M6281 Muscle weakness (generalized): Secondary | ICD-10-CM | POA: Diagnosis not present

## 2016-10-17 ENCOUNTER — Encounter: Payer: Self-pay | Admitting: Endocrinology

## 2016-10-17 ENCOUNTER — Ambulatory Visit (INDEPENDENT_AMBULATORY_CARE_PROVIDER_SITE_OTHER): Payer: Medicare Other | Admitting: Endocrinology

## 2016-10-17 VITALS — BP 136/80 | HR 86 | Ht 64.0 in | Wt 164.0 lb

## 2016-10-17 DIAGNOSIS — E119 Type 2 diabetes mellitus without complications: Secondary | ICD-10-CM

## 2016-10-17 NOTE — Patient Instructions (Addendum)

## 2016-10-17 NOTE — Progress Notes (Signed)
Subjective:    Patient ID: [name], female    DOB: Jun 08, 1941, 76 y.o.   MRN: [id number]  HPI Pt returns for f/u of diabetes mellitus: DM type: 2 Dx'ed: 7322 Complications: none Therapy: metformin GDM: never DKA: never Severe hypoglycemia: never Pancreatitis: never Other: she has never been on insulin Interval history:  pt states she feels better since recent episode of bronchitis.  She is off steroids now.  Past Medical History:  Diagnosis Date  . Allergic rhinitis   . Asthma   . Diabetes mellitus    just changed from injection to metformin ? month  . Dysrhythmia    palpitations  . Glaucoma   . History of kidney stones   . Hyperlipemia   . Migraines   . Palpitations   . Pneumonia    hx  . Urinary tract infection     Past Surgical History:  Procedure Laterality Date  . ABDOMINAL HYSTERECTOMY     Has ovaries  . BACK SURGERY  1987   lumb  . BREAST SURGERY Left 07/2016   bx- needle in breast  . CHOLECYSTECTOMY  09/26/13  . CLOSED REDUCTION NASAL FRACTURE  04/29/2012   Procedure: CLOSED REDUCTION NASAL FRACTURE;  Surgeon: Jodi Marble, MD;  Location: [place];  Service: ENT;  Laterality: N/A;  WITH STABILIZATION  . COLONOSCOPY    . Deviated septum repair    . EYE SURGERY Bilateral    cataracts  . LITHOTRIPSY    . REVERSE SHOULDER ARTHROPLASTY Right 09/15/2016   Procedure: RIGHT REVERSE SHOULDER ARTHROPLASTY;  Surgeon: Netta Cedars, MD;  Location: [place];  Service: Orthopedics;  Laterality: Right;  . ROTATOR CUFF REPAIR     right  . TONSILLECTOMY      Social History   Social History  . Marital status: Married    Spouse name: N/A  . Number of children: 2  . Years of education: N/A   Occupational History  . Retired -Personal assistant Retired   Social History Main Topics  . Smoking status: Former Smoker    Packs/day: 0.10    Years: 4.00    Types: Cigarettes    Quit date: 06/19/1986  . Smokeless tobacco: Never Used     Comment: smoked  in college years ago  . Alcohol use 0.0 oz/week     Comment: 1-2 glasses week variety beer occ scotch  . Drug use: No  . Sexual activity: Not on file   Other Topics Concern  . Not on file   Social History Narrative   Daily caffeine     Current Outpatient Prescriptions on File Prior to Visit  Medication Sig Dispense Refill  . albuterol (PROAIR HFA) 108 (90 Base) MCG/ACT inhaler Inhale 2 puffs into the lungs every 4 (four) hours as needed for wheezing or shortness of breath. 1 Inhaler 6  . atorvastatin (LIPITOR) 20 MG tablet TAKE ONE (1) TABLET EACH DAY 90 tablet 1  . Biotin 5000 MCG CAPS Take 1 capsule by mouth daily.    . Chlorpheniramine Maleate (ALLERGY PO) Take 1 tablet by mouth daily.    . citalopram (CELEXA) 40 MG tablet TAKE ONE (1) TABLET EACH DAY 30 tablet 6  . fenofibrate (TRICOR) 145 MG tablet TAKE ONE (1) TABLET EACH DAY AFTER BREKFAST (Patient taking differently: Take 145 mg by mouth daily. ) 30 tablet 6  . fluticasone (FLONASE) 50 MCG/ACT nasal spray Place 1 spray into both nostrils daily. 16 g 2  . LUMIGAN 0.01 %  SOLN Place 1 drop into both eyes at bedtime.     . metFORMIN (GLUCOPHAGE-XR) 500 MG 24 hr tablet Take 4 tablets (2,000 mg total) by mouth daily with breakfast. (Patient taking differently: Take 2,000 mg by mouth daily with breakfast. Takes 4 tablets at once) 120 tablet 11  . montelukast (SINGULAIR) 10 MG tablet Take 1 tablet (10 mg total) by mouth at bedtime. 30 tablet 5  . ONE TOUCH ULTRA TEST test strip USE 1 TIME A DAY 50 each 12  . ONETOUCH DELICA LANCETS 76H MISC Use one lancet to test sugars once daily. E11.9 100 each 12  . oxyCODONE (OXY IR/ROXICODONE) 5 MG immediate release tablet Take 1 tablet (5 mg total) by mouth every 4 (four) hours as needed for severe pain. 30 tablet 0  . traMADol (ULTRAM) 50 MG tablet Take 1-2 tablets (50-100 mg total) by mouth every 6 (six) hours as needed for moderate pain. 60 tablet 0   No current facility-administered  medications on file prior to visit.     Allergies  Allergen Reactions  . Papain Anaphylaxis    Takes an extreme concentration  . Papaya Derivatives Anaphylaxis  . Mushroom Ext Cmplx(Shiitake-Reishi-Mait) Other (See Comments)    Head congestion and sore throat  . Venlafaxine Other (See Comments)    UNSPECIFIED REACTION  Pt states she can take Name brand. Lethargy    Family History  Problem Relation Age of Onset  . Diabetes Father   . Colon cancer Neg Hx   . Esophageal cancer Neg Hx   . Rectal cancer Neg Hx   . Stomach cancer Neg Hx   . Breast cancer Neg Hx     BP 136/80   Pulse 86   Ht 5\' 4"  (1.626 m)   Wt 164 lb (74.4 kg)   SpO2 95%   BMI 28.15 kg/m    Review of Systems She has lost a few lbs, due to her efforts.     Objective:   Physical Exam VITAL SIGNS:  See vs page GENERAL: no distress Pulses: dorsalis pedis intact bilat.   MSK: no deformity of the feet.   CV: no leg edema.  Skin:  no ulcer on the feet.  normal color and temp on the feet. Neuro: sensation is intact to touch on the feet.     Lab Results  Component Value Date   CREATININE 0.77 09/16/2016   BUN 13 09/16/2016   NA 137 09/16/2016   K 3.8 09/16/2016   CL 103 09/16/2016   CO2 26 09/16/2016   Lab Results  Component Value Date   HGBA1C 7.3 (H) 09/08/2016      Assessment & Plan:  Type 2 DM: adeq control for age.  We discussed adding another med, but pt declines. Acute bronchitis. Steroid rx can affect glycemic control, but it was after the recent a1c.  Patient Instructions  check your blood sugar once a day.  vary the time of day when you check, between before the 3 meals, and at bedtime.  also check if you have symptoms of your blood sugar being too high or too low.  please keep a record of the readings and bring it to your next appointment here (or you can bring the meter itself).  You can write it on any piece of paper.  please call us sooner if your blood sugar goes below 70, or if you  have a lot of readings over 200.  Please continue the same metformin.   Please  come back for a follow-up appointment in 4 months.

## 2016-10-23 ENCOUNTER — Other Ambulatory Visit: Payer: Self-pay | Admitting: Family Medicine

## 2016-10-24 ENCOUNTER — Other Ambulatory Visit: Payer: Self-pay | Admitting: General Practice

## 2016-10-24 MED ORDER — VITAMIN D (ERGOCALCIFEROL) 1.25 MG (50000 UNIT) PO CAPS: 50000.0000 [IU] | ORAL_CAPSULE | ORAL | 0 refills | Status: DC

## 2016-10-31 DIAGNOSIS — H401131 Primary open-angle glaucoma, bilateral, mild stage: Secondary | ICD-10-CM | POA: Diagnosis not present

## 2016-10-31 DIAGNOSIS — H524 Presbyopia: Secondary | ICD-10-CM | POA: Diagnosis not present

## 2016-10-31 DIAGNOSIS — Z961 Presence of intraocular lens: Secondary | ICD-10-CM | POA: Diagnosis not present

## 2016-10-31 DIAGNOSIS — E119 Type 2 diabetes mellitus without complications: Secondary | ICD-10-CM | POA: Diagnosis not present

## 2016-11-02 DIAGNOSIS — Z471 Aftercare following joint replacement surgery: Secondary | ICD-10-CM | POA: Diagnosis not present

## 2016-11-02 DIAGNOSIS — Z96611 Presence of right artificial shoulder joint: Secondary | ICD-10-CM | POA: Diagnosis not present

## 2016-11-03 DIAGNOSIS — J301 Allergic rhinitis due to pollen: Secondary | ICD-10-CM | POA: Diagnosis not present

## 2016-11-03 DIAGNOSIS — R05 Cough: Secondary | ICD-10-CM | POA: Diagnosis not present

## 2016-11-03 DIAGNOSIS — R0981 Nasal congestion: Secondary | ICD-10-CM | POA: Diagnosis not present

## 2016-11-06 DIAGNOSIS — J418 Mixed simple and mucopurulent chronic bronchitis: Secondary | ICD-10-CM | POA: Diagnosis not present

## 2016-11-08 DIAGNOSIS — J411 Mucopurulent chronic bronchitis: Secondary | ICD-10-CM | POA: Diagnosis not present

## 2016-11-08 DIAGNOSIS — J4540 Moderate persistent asthma, uncomplicated: Secondary | ICD-10-CM | POA: Diagnosis not present

## 2016-11-08 DIAGNOSIS — J9811 Atelectasis: Secondary | ICD-10-CM | POA: Diagnosis not present

## 2016-11-08 DIAGNOSIS — R0602 Shortness of breath: Secondary | ICD-10-CM | POA: Diagnosis not present

## 2016-11-08 DIAGNOSIS — R05 Cough: Secondary | ICD-10-CM | POA: Diagnosis not present

## 2016-11-30 ENCOUNTER — Other Ambulatory Visit: Payer: Self-pay | Admitting: Family Medicine

## 2016-12-07 DIAGNOSIS — Z96611 Presence of right artificial shoulder joint: Secondary | ICD-10-CM | POA: Diagnosis not present

## 2016-12-07 DIAGNOSIS — Z471 Aftercare following joint replacement surgery: Secondary | ICD-10-CM | POA: Diagnosis not present

## 2016-12-12 ENCOUNTER — Encounter: Payer: Self-pay | Admitting: Pulmonary Disease

## 2016-12-12 ENCOUNTER — Ambulatory Visit (INDEPENDENT_AMBULATORY_CARE_PROVIDER_SITE_OTHER): Payer: Medicare Other | Admitting: Pulmonary Disease

## 2016-12-12 VITALS — BP 118/60 | HR 75 | Ht 63.25 in | Wt 166.0 lb

## 2016-12-12 DIAGNOSIS — R05 Cough: Secondary | ICD-10-CM | POA: Diagnosis not present

## 2016-12-12 DIAGNOSIS — J4540 Moderate persistent asthma, uncomplicated: Secondary | ICD-10-CM

## 2016-12-12 DIAGNOSIS — R059 Cough, unspecified: Secondary | ICD-10-CM

## 2016-12-12 LAB — PULMONARY FUNCTION TEST
DL/VA % PRED: 98 %
DL/VA: 4.66 ml/min/mmHg/L
DLCO COR % PRED: 68 %
DLCO UNC: 16.5 ml/min/mmHg
DLCO cor: 15.9 ml/min/mmHg
DLCO unc % pred: 70 %
FEF 25-75 POST: 2.32 L/s
FEF 25-75 Pre: 2.15 L/sec
FEF2575-%CHANGE-POST: 7 %
FEF2575-%PRED-POST: 153 %
FEF2575-%Pred-Pre: 142 %
FEV1-%CHANGE-POST: 2 %
FEV1-%PRED-POST: 92 %
FEV1-%Pred-Pre: 89 %
FEV1-PRE: 1.77 L
FEV1-Post: 1.82 L
FEV1FVC-%CHANGE-POST: 4 %
FEV1FVC-%Pred-Pre: 113 %
FEV6-%Change-Post: -2 %
FEV6-%PRED-POST: 82 %
FEV6-%Pred-Pre: 84 %
FEV6-PRE: 2.1 L
FEV6-Post: 2.06 L
FEV6FVC-%Change-Post: 0 %
FEV6FVC-%Pred-Post: 105 %
FEV6FVC-%Pred-Pre: 105 %
FVC-%CHANGE-POST: -2 %
FVC-%PRED-PRE: 79 %
FVC-%Pred-Post: 77 %
FVC-POST: 2.06 L
FVC-PRE: 2.1 L
POST FEV1/FVC RATIO: 88 %
PRE FEV6/FVC RATIO: 100 %
Post FEV6/FVC ratio: 100 %
Pre FEV1/FVC ratio: 84 %
RV % PRED: 76 %
RV: 1.74 L
TLC % pred: 77 %
TLC: 3.84 L

## 2016-12-12 MED ORDER — AZITHROMYCIN 250 MG PO TABS: ORAL_TABLET | ORAL | 0 refills | Status: DC

## 2016-12-12 MED ORDER — PREDNISONE 10 MG PO TABS: ORAL_TABLET | ORAL | 0 refills | Status: DC

## 2016-12-12 NOTE — Progress Notes (Signed)
PFT done today. 

## 2016-12-12 NOTE — Patient Instructions (Signed)
Follow up in 1 year.

## 2016-12-12 NOTE — Progress Notes (Signed)
Current Outpatient Prescriptions on File Prior to Visit  Medication Sig  . albuterol (PROAIR HFA) 108 (90 Base) MCG/ACT inhaler Inhale 2 puffs into the lungs every 4 (four) hours as needed for wheezing or shortness of breath.  Marland Kitchen atorvastatin (LIPITOR) 20 MG tablet TAKE ONE (1) TABLET EACH DAY  . Biotin 5000 MCG CAPS Take 1 capsule by mouth daily.  . Chlorpheniramine Maleate (ALLERGY PO) Take 1 tablet by mouth daily.  . citalopram (CELEXA) 40 MG tablet TAKE ONE (1) TABLET EACH DAY  . fenofibrate (TRICOR) 145 MG tablet TAKE ONE (1) TABLET EACH DAY AFTER BREKFAST (Patient taking differently: Take 145 mg by mouth daily. )  . fluticasone (FLONASE) 50 MCG/ACT nasal spray Place 1 spray into both nostrils daily.  Marland Kitchen LUMIGAN 0.01 % SOLN Place 1 drop into both eyes at bedtime.   . metFORMIN (GLUCOPHAGE-XR) 500 MG 24 hr tablet Take 4 tablets (2,000 mg total) by mouth daily with breakfast. (Patient taking differently: Take 2,000 mg by mouth daily with breakfast. Takes 4 tablets at once)  . montelukast (SINGULAIR) 10 MG tablet Take 1 tablet (10 mg total) by mouth at bedtime.  . ONE TOUCH ULTRA TEST test strip USE 1 TIME A DAY  . ONETOUCH DELICA LANCETS 37D MISC Use one lancet to test sugars once daily. E11.9  . Vitamin D, Ergocalciferol, (DRISDOL) 50000 units CAPS capsule Take 1 capsule (50,000 Units total) by mouth every 7 (seven) days.  Marland Kitchen oxyCODONE (OXY IR/ROXICODONE) 5 MG immediate release tablet Take 1 tablet (5 mg total) by mouth every 4 (four) hours as needed for severe pain. (Patient not taking: Reported on 12/12/2016)  . traMADol (ULTRAM) 50 MG tablet Take 1-2 tablets (50-100 mg total) by mouth every 6 (six) hours as needed for moderate pain. (Patient not taking: Reported on 12/12/2016)   No current facility-administered medications on file prior to visit.      Chief Complaint  Patient presents with  . Follow-up    Pt had flu that lasted three weeks, just ended last week. Pt still coughing up  yellow, thick more with exertion. Pt more fatigue than usual since shoulder surgery.     Pulmonary tests Spirometry 04/12/10 >> FEV1 1.92 (100%), FEV1% 86 Spirometry 08/27/13 >> FEV1 1.83 (88%), FEV1% 82 FeNO 07/12/16 >> 11 PFT 12/12/16 >> FEV1 1.82 (92%), FEV1% 88, TLC 3.84 (77%), DLCO 70%, no BD  Cardiac tests Echo 01/20/11 > EF 55 to 60%, grade 1 DD  Past medical history She  has a past medical history of Allergic rhinitis; Asthma; Diabetes mellitus; Dysrhythmia; Glaucoma; History of kidney stones; Hyperlipemia; Migraines; Palpitations; Pneumonia; and Urinary tract infection.  Vital signs BP 118/60 (BP Location: Left Arm, Cuff Size: Normal)   Pulse 75   Ht 5' 3.25" (1.607 m)   Wt 166 lb (75.3 kg)   SpO2 96%   BMI 29.17 kg/m   History of present illness KEYAUNA GRAEFE is a 76 y.o. female with cough.  She had a respiratory infection recently.  She was having cough, wheeze, and congestion.  She was tx with abx and prednisone and better.  She isn't having much of a cough a present unless she exerts too much.  She hasn't been very active since she had shoulder surgery, but is starting to exercise more.  PFT today showed mild restriction and diffusion defect that corrected for lung volumes.  She has CXR from August 10, 2016 that was normal.  Physical exam  General - pleasant Eyes - pupils  reactive ENT - no sinus tenderness, no oral exudate, no LAN Cardiac - regular, no murmur Chest - no wheeze, rales Abd - soft, non tender Ext - no edema Skin - no rashes Neuro - normal strength Psych - normal mood    CMP Latest Ref Rng & Units 09/16/2016 09/08/2016 07/24/2016  Glucose 65 - 99 mg/dL 186(H) 148(H) 129(H)  BUN 6 - 20 mg/dL 13 14 26(H)  Creatinine 0.44 - 1.00 mg/dL 0.77 0.88 1.07  Sodium 135 - 145 mmol/L 137 141 138  Potassium 3.5 - 5.1 mmol/L 3.8 4.0 4.0  Chloride 101 - 111 mmol/L 103 108 104  CO2 22 - 32 mmol/L 26 23 27   Calcium 8.9 - 10.3 mg/dL 8.7(L) 10.1 9.9  Total  Protein 6.0 - 8.3 g/dL - - 7.3  Total Bilirubin 0.2 - 1.2 mg/dL - - 0.4  Alkaline Phos 39 - 117 U/L - - 41  AST 0 - 37 U/L - - 27  ALT 0 - 35 U/L - - 32     CBC Latest Ref Rng & Units 09/16/2016 09/08/2016 07/24/2016  WBC 4.0 - 10.5 K/uL - 7.1 9.1  Hemoglobin 12.0 - 15.0 g/dL 10.5(L) 13.7 14.2  Hematocrit 36.0 - 46.0 % 32.1(L) 41.5 42.4  Platelets 150 - 400 K/uL - 283 321.0     Assessment/plan  Asthmatic bronchitis. - usually flares with respiratory infection - gave her prednisone and zpak scripts to keep on hand >> she is to call if she feels the need to use these - continue singulair and prn proair  Upper airway cough syndrome. - continue flonase, singulair, and prn antihistamine  Time spent 27 minutes   Patient Instructions  Follow up in 1 year    Chesley Mires, MD [place] Pager:  [phone] 12/12/2016, 3:11 PM

## 2017-01-08 DIAGNOSIS — Z96611 Presence of right artificial shoulder joint: Secondary | ICD-10-CM | POA: Diagnosis not present

## 2017-01-08 DIAGNOSIS — M6281 Muscle weakness (generalized): Secondary | ICD-10-CM | POA: Diagnosis not present

## 2017-01-08 DIAGNOSIS — R278 Other lack of coordination: Secondary | ICD-10-CM | POA: Diagnosis not present

## 2017-01-08 DIAGNOSIS — Z471 Aftercare following joint replacement surgery: Secondary | ICD-10-CM | POA: Diagnosis not present

## 2017-01-10 DIAGNOSIS — M6281 Muscle weakness (generalized): Secondary | ICD-10-CM | POA: Diagnosis not present

## 2017-01-10 DIAGNOSIS — R278 Other lack of coordination: Secondary | ICD-10-CM | POA: Diagnosis not present

## 2017-01-10 DIAGNOSIS — Z471 Aftercare following joint replacement surgery: Secondary | ICD-10-CM | POA: Diagnosis not present

## 2017-01-10 DIAGNOSIS — Z96611 Presence of right artificial shoulder joint: Secondary | ICD-10-CM | POA: Diagnosis not present

## 2017-01-11 DIAGNOSIS — R921 Mammographic calcification found on diagnostic imaging of breast: Secondary | ICD-10-CM | POA: Diagnosis not present

## 2017-01-11 LAB — HM MAMMOGRAPHY

## 2017-01-12 ENCOUNTER — Encounter: Payer: Self-pay | Admitting: General Practice

## 2017-01-12 ENCOUNTER — Ambulatory Visit (INDEPENDENT_AMBULATORY_CARE_PROVIDER_SITE_OTHER): Payer: Medicare Other | Admitting: Family Medicine

## 2017-01-12 ENCOUNTER — Encounter: Payer: Self-pay | Admitting: Family Medicine

## 2017-01-12 VITALS — BP 124/82 | HR 76 | Temp 97.9°F | Resp 16 | Ht 63.0 in | Wt 165.5 lb

## 2017-01-12 DIAGNOSIS — M2550 Pain in unspecified joint: Secondary | ICD-10-CM

## 2017-01-12 DIAGNOSIS — E782 Mixed hyperlipidemia: Secondary | ICD-10-CM

## 2017-01-12 DIAGNOSIS — R197 Diarrhea, unspecified: Secondary | ICD-10-CM | POA: Diagnosis not present

## 2017-01-12 DIAGNOSIS — Z471 Aftercare following joint replacement surgery: Secondary | ICD-10-CM | POA: Diagnosis not present

## 2017-01-12 DIAGNOSIS — R278 Other lack of coordination: Secondary | ICD-10-CM | POA: Diagnosis not present

## 2017-01-12 DIAGNOSIS — Z96611 Presence of right artificial shoulder joint: Secondary | ICD-10-CM | POA: Diagnosis not present

## 2017-01-12 DIAGNOSIS — M6281 Muscle weakness (generalized): Secondary | ICD-10-CM | POA: Diagnosis not present

## 2017-01-12 LAB — LIPID PANEL
CHOL/HDL RATIO: 3
Cholesterol: 102 mg/dL (ref 0–200)
HDL: 34.4 mg/dL — ABNORMAL LOW (ref >39.00)
LDL Cholesterol: 48 mg/dL (ref 0–99)
NONHDL: 67.26
Triglycerides: 95 mg/dL (ref 0.0–149.0)
VLDL: 19 mg/dL (ref 0.0–40.0)

## 2017-01-12 LAB — BASIC METABOLIC PANEL
BUN: 10 mg/dL (ref 6–23)
CHLORIDE: 106 meq/L (ref 96–112)
CO2: 23 mEq/L (ref 19–32)
Calcium: 9.9 mg/dL (ref 8.4–10.5)
Creatinine, Ser: 0.72 mg/dL (ref 0.40–1.20)
GFR: 83.43 mL/min (ref >60.00)
Glucose, Bld: 176 mg/dL — ABNORMAL HIGH (ref 70–99)
POTASSIUM: 4.2 meq/L (ref 3.5–5.1)
Sodium: 139 mEq/L (ref 135–145)

## 2017-01-12 LAB — CBC WITH DIFFERENTIAL/PLATELET
BASOS PCT: 0.8 % (ref 0.0–3.0)
Basophils Absolute: 0 10*3/uL (ref 0.0–0.1)
EOS PCT: 4.6 % (ref 0.0–5.0)
Eosinophils Absolute: 0.3 10*3/uL (ref 0.0–0.7)
HCT: 38.5 % (ref 36.0–46.0)
HEMOGLOBIN: 12.8 g/dL (ref 12.0–15.0)
LYMPHS ABS: 1.6 10*3/uL (ref 0.7–4.0)
Lymphocytes Relative: 24.6 % (ref 12.0–46.0)
MCHC: 33.3 g/dL (ref 30.0–36.0)
MCV: 85.3 fl (ref 78.0–100.0)
MONO ABS: 0.5 10*3/uL (ref 0.1–1.0)
MONOS PCT: 7.2 % (ref 3.0–12.0)
Neutro Abs: 4.1 10*3/uL (ref 1.4–7.7)
Neutrophils Relative %: 62.8 % (ref 43.0–77.0)
Platelets: 308 10*3/uL (ref 150.0–400.0)
RBC: 4.51 Mil/uL (ref 3.87–5.11)
RDW: 15 % (ref 11.5–15.5)
WBC: 6.6 10*3/uL (ref 4.0–10.5)

## 2017-01-12 LAB — HEPATIC FUNCTION PANEL
ALT: 20 U/L (ref 0–35)
AST: 21 U/L (ref 0–37)
Albumin: 4.4 g/dL (ref 3.5–5.2)
Alkaline Phosphatase: 48 U/L (ref 39–117)
BILIRUBIN TOTAL: 0.4 mg/dL (ref 0.2–1.2)
Bilirubin, Direct: 0.1 mg/dL (ref 0.0–0.3)
Total Protein: 7 g/dL (ref 6.0–8.3)

## 2017-01-12 LAB — TSH: TSH: 2.24 u[IU]/mL (ref 0.35–4.50)

## 2017-01-12 NOTE — Patient Instructions (Signed)
Follow up as needed/scheduled We'll notify you of your lab results and make any changes if needed Tylenol as needed for joint stiffness Drink plenty of fluids Continue the Align daily to help w/ bowels Call with any questions or concerns Enjoy the rest of your summer!!!

## 2017-01-12 NOTE — Progress Notes (Signed)
Pre visit review using our clinic review tool, if applicable. No additional management support is needed unless otherwise documented below in the visit note. 

## 2017-01-12 NOTE — Progress Notes (Signed)
   Subjective:    Patient ID: [name], female    DOB: January 07, 1941, 77 y.o.   MRN: [id number]  HPI Hyperlipidemia- chronic problem, on Lipitor 20mg  and Fenofibrate 145mg  daily.  Denies CP, SOB, HAs, visual changes, edema.  Diarrhea- pt had quite a bit of abx after her shoulder replacement and subsequent bronchitis.  No relief w/ Align.  + fatigue.  Having 3-5 watery stools daily.  No known sick contacts.  Joint stiffness- difficulty getting up from a seated position.  Once joints 'warm up' things improve.     Review of Systems For ROS see HPI     Objective:   Physical Exam  Constitutional: She is oriented to person, place, and time. She appears well-developed and well-nourished. No distress.  HENT:  Head: Normocephalic and atraumatic.  MMM  Neck: Neck supple. No thyromegaly present.  Cardiovascular: Normal rate, regular rhythm and intact distal pulses.   Pulmonary/Chest: Effort normal and breath sounds normal. No respiratory distress. She has no wheezes. She has no rales.  Abdominal: Soft. She exhibits no distension. There is no tenderness. There is no rebound.  Hyperactive BS  Musculoskeletal: She exhibits no edema or tenderness.  Lymphadenopathy:    She has no cervical adenopathy.  Neurological: She is alert and oriented to person, place, and time.  Skin: Skin is warm and dry.  Psychiatric: She has a normal mood and affect. Her behavior is normal. Thought content normal.  Vitals reviewed.         Assessment & Plan:  Hyperlipidemia- chronic problem.  Pt is overdue for labs.  Stressed need for healthy diet and regular exercise.  Check labs.  Adjust meds prn   Diarrhea- new.  Pt reports this has been ongoing and 'things run through me like Photographer through [employer]'.  Pt reports 4-5 watery stools daily.  This started shortly after her completion of multiple abx.  Concern for C diff.  Check CBC, electrolytes, and C diff studies.  Start probiotic, fiber, fluids.  Will  follow.  Polyarthralgia- new.  Suspect this may be age related but given AM stiffness, will check labs to r/o RA.  Pt expressed understanding and is in agreement w/ plan.

## 2017-01-15 ENCOUNTER — Other Ambulatory Visit (HOSPITAL_COMMUNITY)
Admit: 2017-01-15 | Discharge: 2017-01-15 | Disposition: A | Discharge status: Home / Self Care | Payer: Medicare Other | Attending: Family Medicine | Admitting: Family Medicine

## 2017-01-15 ENCOUNTER — Encounter: Payer: Self-pay | Admitting: General Practice

## 2017-01-15 DIAGNOSIS — R197 Diarrhea, unspecified: Secondary | ICD-10-CM | POA: Insufficient documentation

## 2017-01-15 DIAGNOSIS — R278 Other lack of coordination: Secondary | ICD-10-CM | POA: Diagnosis not present

## 2017-01-15 DIAGNOSIS — Z471 Aftercare following joint replacement surgery: Secondary | ICD-10-CM | POA: Diagnosis not present

## 2017-01-15 DIAGNOSIS — Z96611 Presence of right artificial shoulder joint: Secondary | ICD-10-CM | POA: Diagnosis not present

## 2017-01-15 DIAGNOSIS — M6281 Muscle weakness (generalized): Secondary | ICD-10-CM | POA: Diagnosis not present

## 2017-01-15 LAB — C DIFFICILE QUICK SCREEN W PCR REFLEX
C DIFFICILE (CDIFF) INTERP: NOT DETECTED
C Diff antigen: NEGATIVE
C Diff toxin: NEGATIVE

## 2017-01-15 LAB — RHEUMATOID FACTOR: Rhuematoid fact SerPl-aCnc: <14 IU/mL (ref <14)

## 2017-01-15 LAB — ANA: Anti Nuclear Antibody(ANA): NEGATIVE

## 2017-01-16 DIAGNOSIS — M9902 Segmental and somatic dysfunction of thoracic region: Secondary | ICD-10-CM | POA: Diagnosis not present

## 2017-01-16 DIAGNOSIS — M9903 Segmental and somatic dysfunction of lumbar region: Secondary | ICD-10-CM | POA: Diagnosis not present

## 2017-01-16 DIAGNOSIS — M546 Pain in thoracic spine: Secondary | ICD-10-CM | POA: Diagnosis not present

## 2017-01-16 DIAGNOSIS — M9905 Segmental and somatic dysfunction of pelvic region: Secondary | ICD-10-CM | POA: Diagnosis not present

## 2017-01-16 DIAGNOSIS — M545 Low back pain: Secondary | ICD-10-CM | POA: Diagnosis not present

## 2017-01-16 DIAGNOSIS — M5126 Other intervertebral disc displacement, lumbar region: Secondary | ICD-10-CM | POA: Diagnosis not present

## 2017-01-17 DIAGNOSIS — R278 Other lack of coordination: Secondary | ICD-10-CM | POA: Diagnosis not present

## 2017-01-17 DIAGNOSIS — Z471 Aftercare following joint replacement surgery: Secondary | ICD-10-CM | POA: Diagnosis not present

## 2017-01-17 DIAGNOSIS — Z96611 Presence of right artificial shoulder joint: Secondary | ICD-10-CM | POA: Diagnosis not present

## 2017-01-17 DIAGNOSIS — M6281 Muscle weakness (generalized): Secondary | ICD-10-CM | POA: Diagnosis not present

## 2017-01-18 ENCOUNTER — Encounter: Payer: Self-pay | Admitting: General Practice

## 2017-01-19 DIAGNOSIS — Z96611 Presence of right artificial shoulder joint: Secondary | ICD-10-CM | POA: Diagnosis not present

## 2017-01-19 DIAGNOSIS — Z471 Aftercare following joint replacement surgery: Secondary | ICD-10-CM | POA: Diagnosis not present

## 2017-01-19 DIAGNOSIS — M6281 Muscle weakness (generalized): Secondary | ICD-10-CM | POA: Diagnosis not present

## 2017-01-19 DIAGNOSIS — R278 Other lack of coordination: Secondary | ICD-10-CM | POA: Diagnosis not present

## 2017-01-22 DIAGNOSIS — Z471 Aftercare following joint replacement surgery: Secondary | ICD-10-CM | POA: Diagnosis not present

## 2017-01-22 DIAGNOSIS — Z96611 Presence of right artificial shoulder joint: Secondary | ICD-10-CM | POA: Diagnosis not present

## 2017-01-22 DIAGNOSIS — M6281 Muscle weakness (generalized): Secondary | ICD-10-CM | POA: Diagnosis not present

## 2017-01-22 DIAGNOSIS — R278 Other lack of coordination: Secondary | ICD-10-CM | POA: Diagnosis not present

## 2017-01-24 ENCOUNTER — Other Ambulatory Visit: Payer: Self-pay | Admitting: Family Medicine

## 2017-01-24 DIAGNOSIS — Z471 Aftercare following joint replacement surgery: Secondary | ICD-10-CM | POA: Diagnosis not present

## 2017-01-24 DIAGNOSIS — Z96611 Presence of right artificial shoulder joint: Secondary | ICD-10-CM | POA: Diagnosis not present

## 2017-01-24 DIAGNOSIS — R278 Other lack of coordination: Secondary | ICD-10-CM | POA: Diagnosis not present

## 2017-01-24 DIAGNOSIS — M6281 Muscle weakness (generalized): Secondary | ICD-10-CM | POA: Diagnosis not present

## 2017-01-24 NOTE — Telephone Encounter (Signed)
Please advise pt had this ordered on 10/2016 but I do not see any labs?

## 2017-01-26 DIAGNOSIS — Z96611 Presence of right artificial shoulder joint: Secondary | ICD-10-CM | POA: Diagnosis not present

## 2017-01-26 DIAGNOSIS — M6281 Muscle weakness (generalized): Secondary | ICD-10-CM | POA: Diagnosis not present

## 2017-01-26 DIAGNOSIS — Z471 Aftercare following joint replacement surgery: Secondary | ICD-10-CM | POA: Diagnosis not present

## 2017-01-26 DIAGNOSIS — R278 Other lack of coordination: Secondary | ICD-10-CM | POA: Diagnosis not present

## 2017-01-29 DIAGNOSIS — R278 Other lack of coordination: Secondary | ICD-10-CM | POA: Diagnosis not present

## 2017-01-29 DIAGNOSIS — Z471 Aftercare following joint replacement surgery: Secondary | ICD-10-CM | POA: Diagnosis not present

## 2017-01-29 DIAGNOSIS — Z96611 Presence of right artificial shoulder joint: Secondary | ICD-10-CM | POA: Diagnosis not present

## 2017-01-29 DIAGNOSIS — M6281 Muscle weakness (generalized): Secondary | ICD-10-CM | POA: Diagnosis not present

## 2017-01-31 DIAGNOSIS — M6281 Muscle weakness (generalized): Secondary | ICD-10-CM | POA: Diagnosis not present

## 2017-01-31 DIAGNOSIS — Z471 Aftercare following joint replacement surgery: Secondary | ICD-10-CM | POA: Diagnosis not present

## 2017-01-31 DIAGNOSIS — Z96611 Presence of right artificial shoulder joint: Secondary | ICD-10-CM | POA: Diagnosis not present

## 2017-01-31 DIAGNOSIS — R278 Other lack of coordination: Secondary | ICD-10-CM | POA: Diagnosis not present

## 2017-02-02 DIAGNOSIS — M6281 Muscle weakness (generalized): Secondary | ICD-10-CM | POA: Diagnosis not present

## 2017-02-02 DIAGNOSIS — Z471 Aftercare following joint replacement surgery: Secondary | ICD-10-CM | POA: Diagnosis not present

## 2017-02-02 DIAGNOSIS — R278 Other lack of coordination: Secondary | ICD-10-CM | POA: Diagnosis not present

## 2017-02-02 DIAGNOSIS — Z96611 Presence of right artificial shoulder joint: Secondary | ICD-10-CM | POA: Diagnosis not present

## 2017-02-05 DIAGNOSIS — M6281 Muscle weakness (generalized): Secondary | ICD-10-CM | POA: Diagnosis not present

## 2017-02-05 DIAGNOSIS — R278 Other lack of coordination: Secondary | ICD-10-CM | POA: Diagnosis not present

## 2017-02-05 DIAGNOSIS — Z471 Aftercare following joint replacement surgery: Secondary | ICD-10-CM | POA: Diagnosis not present

## 2017-02-05 DIAGNOSIS — Z96611 Presence of right artificial shoulder joint: Secondary | ICD-10-CM | POA: Diagnosis not present

## 2017-02-07 DIAGNOSIS — L219 Seborrheic dermatitis, unspecified: Secondary | ICD-10-CM | POA: Diagnosis not present

## 2017-02-07 DIAGNOSIS — R278 Other lack of coordination: Secondary | ICD-10-CM | POA: Diagnosis not present

## 2017-02-07 DIAGNOSIS — Z96611 Presence of right artificial shoulder joint: Secondary | ICD-10-CM | POA: Diagnosis not present

## 2017-02-07 DIAGNOSIS — Z471 Aftercare following joint replacement surgery: Secondary | ICD-10-CM | POA: Diagnosis not present

## 2017-02-07 DIAGNOSIS — M6281 Muscle weakness (generalized): Secondary | ICD-10-CM | POA: Diagnosis not present

## 2017-02-09 DIAGNOSIS — Z96611 Presence of right artificial shoulder joint: Secondary | ICD-10-CM | POA: Diagnosis not present

## 2017-02-09 DIAGNOSIS — R278 Other lack of coordination: Secondary | ICD-10-CM | POA: Diagnosis not present

## 2017-02-09 DIAGNOSIS — Z471 Aftercare following joint replacement surgery: Secondary | ICD-10-CM | POA: Diagnosis not present

## 2017-02-09 DIAGNOSIS — M6281 Muscle weakness (generalized): Secondary | ICD-10-CM | POA: Diagnosis not present

## 2017-02-12 DIAGNOSIS — Z471 Aftercare following joint replacement surgery: Secondary | ICD-10-CM | POA: Diagnosis not present

## 2017-02-12 DIAGNOSIS — M6281 Muscle weakness (generalized): Secondary | ICD-10-CM | POA: Diagnosis not present

## 2017-02-12 DIAGNOSIS — Z96611 Presence of right artificial shoulder joint: Secondary | ICD-10-CM | POA: Diagnosis not present

## 2017-02-12 DIAGNOSIS — R278 Other lack of coordination: Secondary | ICD-10-CM | POA: Diagnosis not present

## 2017-02-14 DIAGNOSIS — M6281 Muscle weakness (generalized): Secondary | ICD-10-CM | POA: Diagnosis not present

## 2017-02-14 DIAGNOSIS — R278 Other lack of coordination: Secondary | ICD-10-CM | POA: Diagnosis not present

## 2017-02-14 DIAGNOSIS — Z471 Aftercare following joint replacement surgery: Secondary | ICD-10-CM | POA: Diagnosis not present

## 2017-02-14 DIAGNOSIS — Z96611 Presence of right artificial shoulder joint: Secondary | ICD-10-CM | POA: Diagnosis not present

## 2017-02-16 DIAGNOSIS — Z471 Aftercare following joint replacement surgery: Secondary | ICD-10-CM | POA: Diagnosis not present

## 2017-02-16 DIAGNOSIS — R278 Other lack of coordination: Secondary | ICD-10-CM | POA: Diagnosis not present

## 2017-02-16 DIAGNOSIS — Z96611 Presence of right artificial shoulder joint: Secondary | ICD-10-CM | POA: Diagnosis not present

## 2017-02-16 DIAGNOSIS — M6281 Muscle weakness (generalized): Secondary | ICD-10-CM | POA: Diagnosis not present

## 2017-02-19 DIAGNOSIS — M6281 Muscle weakness (generalized): Secondary | ICD-10-CM | POA: Diagnosis not present

## 2017-02-19 DIAGNOSIS — Z96611 Presence of right artificial shoulder joint: Secondary | ICD-10-CM | POA: Diagnosis not present

## 2017-02-19 DIAGNOSIS — R278 Other lack of coordination: Secondary | ICD-10-CM | POA: Diagnosis not present

## 2017-02-19 DIAGNOSIS — Z471 Aftercare following joint replacement surgery: Secondary | ICD-10-CM | POA: Diagnosis not present

## 2017-02-20 ENCOUNTER — Encounter: Payer: Self-pay | Admitting: Endocrinology

## 2017-02-20 ENCOUNTER — Ambulatory Visit (INDEPENDENT_AMBULATORY_CARE_PROVIDER_SITE_OTHER): Payer: Medicare Other | Admitting: Endocrinology

## 2017-02-20 VITALS — BP 132/78 | HR 80 | Wt 166.8 lb

## 2017-02-20 DIAGNOSIS — E119 Type 2 diabetes mellitus without complications: Secondary | ICD-10-CM | POA: Diagnosis not present

## 2017-02-20 LAB — POCT GLYCOSYLATED HEMOGLOBIN (HGB A1C): Hemoglobin A1C: 7.2

## 2017-02-20 MED ORDER — SITAGLIPTIN PHOSPHATE 100 MG PO TABS: 100.0000 mg | ORAL_TABLET | Freq: Every day | ORAL | 11 refills | Status: DC

## 2017-02-20 NOTE — Progress Notes (Signed)
Subjective:    Patient ID: [name], female    DOB: 1940-06-22, 76 y.o.   MRN: [id number]  HPI Pt returns for f/u of diabetes mellitus: DM type: 2 Dx'ed: 6948 Complications: none Therapy: metformin GDM: never DKA: never Severe hypoglycemia: never Pancreatitis: never Other: she has never been on insulin Interval history: pt states she feels well in general.  She takes only topical steroids, to the scalp.   Past Medical History:  Diagnosis Date  . Allergic rhinitis   . Asthma   . Diabetes mellitus    just changed from injection to metformin ? month  . Dysrhythmia    palpitations  . Glaucoma   . History of kidney stones   . Hyperlipemia   . Migraines   . Palpitations   . Pneumonia    hx  . Urinary tract infection     Past Surgical History:  Procedure Laterality Date  . ABDOMINAL HYSTERECTOMY     Has ovaries  . BACK SURGERY  1987   lumb  . BREAST SURGERY Left 07/2016   bx- needle in breast  . CHOLECYSTECTOMY  09/26/13  . CLOSED REDUCTION NASAL FRACTURE  04/29/2012   Procedure: CLOSED REDUCTION NASAL FRACTURE;  Surgeon: Jodi Marble, MD;  Location: [place];  Service: ENT;  Laterality: N/A;  WITH STABILIZATION  . COLONOSCOPY    . Deviated septum repair    . EYE SURGERY Bilateral    cataracts  . LITHOTRIPSY    . REVERSE SHOULDER ARTHROPLASTY Right 09/15/2016   Procedure: RIGHT REVERSE SHOULDER ARTHROPLASTY;  Surgeon: Netta Cedars, MD;  Location: [place];  Service: Orthopedics;  Laterality: Right;  . ROTATOR CUFF REPAIR     right  . TONSILLECTOMY      Social History   Social History  . Marital status: Married    Spouse name: N/A  . Number of children: 2  . Years of education: N/A   Occupational History  . Retired -Personal assistant Retired   Social History Main Topics  . Smoking status: Former Smoker    Packs/day: 0.10    Years: 4.00    Types: Cigarettes    Quit date: 06/19/1986  . Smokeless tobacco: Never Used     Comment: smoked in  college years ago  . Alcohol use 0.0 oz/week     Comment: 1-2 glasses week variety beer occ scotch  . Drug use: No  . Sexual activity: Not on file   Other Topics Concern  . Not on file   Social History Narrative   Daily caffeine     Current Outpatient Prescriptions on File Prior to Visit  Medication Sig Dispense Refill  . albuterol (PROAIR HFA) 108 (90 Base) MCG/ACT inhaler Inhale 2 puffs into the lungs every 4 (four) hours as needed for wheezing or shortness of breath. 1 Inhaler 6  . atorvastatin (LIPITOR) 20 MG tablet TAKE ONE (1) TABLET EACH DAY 30 tablet 5  . Biotin 5000 MCG CAPS Take 1 capsule by mouth daily.    . Chlorpheniramine Maleate (ALLERGY PO) Take 1 tablet by mouth daily.    . citalopram (CELEXA) 40 MG tablet TAKE ONE (1) TABLET EACH DAY 30 tablet 6  . fenofibrate (TRICOR) 145 MG tablet TAKE ONE (1) TABLET EACH DAY AFTER BREKFAST (Patient taking differently: Take 145 mg by mouth daily. ) 30 tablet 6  . fluticasone (FLONASE) 50 MCG/ACT nasal spray Place 1 spray into both nostrils daily. 16 g 2  . LUMIGAN 0.01 %  SOLN Place 1 drop into both eyes at bedtime.     . metFORMIN (GLUCOPHAGE-XR) 500 MG 24 hr tablet Take 4 tablets (2,000 mg total) by mouth daily with breakfast. (Patient taking differently: Take 2,000 mg by mouth daily with breakfast. Takes 4 tablets at once) 120 tablet 11  . montelukast (SINGULAIR) 10 MG tablet Take 1 tablet (10 mg total) by mouth at bedtime. 30 tablet 5  . ONE TOUCH ULTRA TEST test strip USE 1 TIME A DAY 50 each 12  . ONETOUCH DELICA LANCETS 09X MISC Use one lancet to test sugars once daily. E11.9 100 each 12  . Vitamin D, Ergocalciferol, (DRISDOL) 50000 units CAPS capsule TAKE ONE CAPSULE BY MOUTH EVERY 7 DAYS. 12 capsule 0   No current facility-administered medications on file prior to visit.     Allergies  Allergen Reactions  . Papain Anaphylaxis    Takes an extreme concentration  . Papaya Derivatives Anaphylaxis  . Mushroom Ext  Cmplx(Shiitake-Reishi-Mait) Other (See Comments)    Head congestion and sore throat  . Venlafaxine Other (See Comments)    UNSPECIFIED REACTION  Pt states she can take Name brand. Lethargy    Family History  Problem Relation Age of Onset  . Diabetes Father   . Colon cancer Neg Hx   . Esophageal cancer Neg Hx   . Rectal cancer Neg Hx   . Stomach cancer Neg Hx   . Breast cancer Neg Hx     BP 132/78   Pulse 80   Wt 166 lb 12.8 oz (75.7 kg)   SpO2 98%   BMI 29.55 kg/m    Review of Systems No weight change    Objective:   Physical Exam VITAL SIGNS:  See vs page GENERAL: no distress Pulses: foot pulses are intact bilaterally.   MSK: no deformity of the feet or ankles.  CV: trace bilat edema of the legs.  Skin:  no ulcer on the feet or ankles.  normal color and temp on the feet and ankles Neuro: sensation is intact to touch on the feet and ankles.    A1c=7.2%  Lab Results  Component Value Date   CREATININE 0.72 01/12/2017   BUN 10 01/12/2017   NA 139 01/12/2017   K 4.2 01/12/2017   CL 106 01/12/2017   CO2 23 01/12/2017      Assessment & Plan:  Edema, new Type 2 DM: she needs increased rx, if it can be done with a regimen that avoids or minimizes hypoglycemia.    Patient Instructions  check your blood sugar once a day.  vary the time of day when you check, between before the 3 meals, and at bedtime.  also check if you have symptoms of your blood sugar being too high or too low.  please keep a record of the readings and bring it to your next appointment here (or you can bring the meter itself).  You can write it on any piece of paper.  please call us sooner if your blood sugar goes below 70, or if you have a lot of readings over 200.  Please continue the same metformin, and: I have sent a prescription to your pharmacy, to add "[place]." If this is too expensive, call us, and we'll most likely cut it to 1/2 pill per day.   Please come back for a follow-up appointment  in 4 months.

## 2017-02-20 NOTE — Patient Instructions (Addendum)
check your blood sugar once a day.  vary the time of day when you check, between before the 3 meals, and at bedtime.  also check if you have symptoms of your blood sugar being too high or too low.  please keep a record of the readings and bring it to your next appointment here (or you can bring the meter itself).  You can write it on any piece of paper.  please call us sooner if your blood sugar goes below 70, or if you have a lot of readings over 200.  Please continue the same metformin, and: I have sent a prescription to your pharmacy, to add "T[place]a." If this is too expensive, call us, and we'll most likely cut it to 1/2 pill per day.   Please come back for a follow-up appointment in 4 months.

## 2017-02-21 ENCOUNTER — Telehealth: Payer: Self-pay | Admitting: Endocrinology

## 2017-02-21 DIAGNOSIS — Z96611 Presence of right artificial shoulder joint: Secondary | ICD-10-CM | POA: Diagnosis not present

## 2017-02-21 DIAGNOSIS — R278 Other lack of coordination: Secondary | ICD-10-CM | POA: Diagnosis not present

## 2017-02-21 DIAGNOSIS — M6281 Muscle weakness (generalized): Secondary | ICD-10-CM | POA: Diagnosis not present

## 2017-02-21 DIAGNOSIS — Z471 Aftercare following joint replacement surgery: Secondary | ICD-10-CM | POA: Diagnosis not present

## 2017-02-21 NOTE — Telephone Encounter (Signed)
Pt is aware that we did not find her purse here

## 2017-02-21 NOTE — Telephone Encounter (Signed)
Patient calling to see if she left her blue purse in the office yesterday. Call patient to advise.

## 2017-02-23 DIAGNOSIS — Z96611 Presence of right artificial shoulder joint: Secondary | ICD-10-CM | POA: Diagnosis not present

## 2017-02-23 DIAGNOSIS — R278 Other lack of coordination: Secondary | ICD-10-CM | POA: Diagnosis not present

## 2017-02-23 DIAGNOSIS — M6281 Muscle weakness (generalized): Secondary | ICD-10-CM | POA: Diagnosis not present

## 2017-02-23 DIAGNOSIS — Z471 Aftercare following joint replacement surgery: Secondary | ICD-10-CM | POA: Diagnosis not present

## 2017-02-26 DIAGNOSIS — R278 Other lack of coordination: Secondary | ICD-10-CM | POA: Diagnosis not present

## 2017-02-26 DIAGNOSIS — M6281 Muscle weakness (generalized): Secondary | ICD-10-CM | POA: Diagnosis not present

## 2017-02-26 DIAGNOSIS — Z471 Aftercare following joint replacement surgery: Secondary | ICD-10-CM | POA: Diagnosis not present

## 2017-02-26 DIAGNOSIS — Z96611 Presence of right artificial shoulder joint: Secondary | ICD-10-CM | POA: Diagnosis not present

## 2017-02-28 DIAGNOSIS — Z96611 Presence of right artificial shoulder joint: Secondary | ICD-10-CM | POA: Diagnosis not present

## 2017-02-28 DIAGNOSIS — R278 Other lack of coordination: Secondary | ICD-10-CM | POA: Diagnosis not present

## 2017-02-28 DIAGNOSIS — M6281 Muscle weakness (generalized): Secondary | ICD-10-CM | POA: Diagnosis not present

## 2017-02-28 DIAGNOSIS — Z471 Aftercare following joint replacement surgery: Secondary | ICD-10-CM | POA: Diagnosis not present

## 2017-03-02 DIAGNOSIS — M6281 Muscle weakness (generalized): Secondary | ICD-10-CM | POA: Diagnosis not present

## 2017-03-02 DIAGNOSIS — Z96611 Presence of right artificial shoulder joint: Secondary | ICD-10-CM | POA: Diagnosis not present

## 2017-03-02 DIAGNOSIS — R278 Other lack of coordination: Secondary | ICD-10-CM | POA: Diagnosis not present

## 2017-03-02 DIAGNOSIS — Z471 Aftercare following joint replacement surgery: Secondary | ICD-10-CM | POA: Diagnosis not present

## 2017-03-05 DIAGNOSIS — M6281 Muscle weakness (generalized): Secondary | ICD-10-CM | POA: Diagnosis not present

## 2017-03-05 DIAGNOSIS — Z471 Aftercare following joint replacement surgery: Secondary | ICD-10-CM | POA: Diagnosis not present

## 2017-03-05 DIAGNOSIS — R278 Other lack of coordination: Secondary | ICD-10-CM | POA: Diagnosis not present

## 2017-03-05 DIAGNOSIS — Z96611 Presence of right artificial shoulder joint: Secondary | ICD-10-CM | POA: Diagnosis not present

## 2017-03-07 DIAGNOSIS — Z471 Aftercare following joint replacement surgery: Secondary | ICD-10-CM | POA: Diagnosis not present

## 2017-03-07 DIAGNOSIS — Z96611 Presence of right artificial shoulder joint: Secondary | ICD-10-CM | POA: Diagnosis not present

## 2017-03-07 DIAGNOSIS — M6281 Muscle weakness (generalized): Secondary | ICD-10-CM | POA: Diagnosis not present

## 2017-03-07 DIAGNOSIS — R278 Other lack of coordination: Secondary | ICD-10-CM | POA: Diagnosis not present

## 2017-03-09 DIAGNOSIS — M6281 Muscle weakness (generalized): Secondary | ICD-10-CM | POA: Diagnosis not present

## 2017-03-09 DIAGNOSIS — Z96611 Presence of right artificial shoulder joint: Secondary | ICD-10-CM | POA: Diagnosis not present

## 2017-03-09 DIAGNOSIS — R278 Other lack of coordination: Secondary | ICD-10-CM | POA: Diagnosis not present

## 2017-03-09 DIAGNOSIS — Z471 Aftercare following joint replacement surgery: Secondary | ICD-10-CM | POA: Diagnosis not present

## 2017-03-14 ENCOUNTER — Encounter: Payer: Self-pay | Admitting: Family Medicine

## 2017-03-14 ENCOUNTER — Other Ambulatory Visit: Payer: Self-pay | Admitting: Family Medicine

## 2017-03-14 DIAGNOSIS — L659 Nonscarring hair loss, unspecified: Secondary | ICD-10-CM

## 2017-03-19 ENCOUNTER — Encounter: Payer: Self-pay | Admitting: Family Medicine

## 2017-03-20 ENCOUNTER — Encounter: Payer: Self-pay | Admitting: Family Medicine

## 2017-03-20 DIAGNOSIS — Z23 Encounter for immunization: Secondary | ICD-10-CM | POA: Diagnosis not present

## 2017-03-22 DIAGNOSIS — L738 Other specified follicular disorders: Secondary | ICD-10-CM | POA: Diagnosis not present

## 2017-03-22 DIAGNOSIS — L648 Other androgenic alopecia: Secondary | ICD-10-CM | POA: Diagnosis not present

## 2017-03-22 DIAGNOSIS — L219 Seborrheic dermatitis, unspecified: Secondary | ICD-10-CM | POA: Diagnosis not present

## 2017-04-02 ENCOUNTER — Other Ambulatory Visit: Payer: Self-pay | Admitting: General Practice

## 2017-04-02 MED ORDER — FENOFIBRATE 145 MG PO TABS: ORAL_TABLET | ORAL | 6 refills | Status: DC

## 2017-04-09 ENCOUNTER — Encounter: Payer: Self-pay | Admitting: Endocrinology

## 2017-04-16 ENCOUNTER — Other Ambulatory Visit: Payer: Self-pay | Admitting: Family Medicine

## 2017-04-30 ENCOUNTER — Other Ambulatory Visit: Payer: Self-pay | Admitting: Pulmonary Disease

## 2017-05-01 ENCOUNTER — Encounter: Payer: Self-pay | Admitting: Pulmonary Disease

## 2017-05-01 DIAGNOSIS — H524 Presbyopia: Secondary | ICD-10-CM | POA: Diagnosis not present

## 2017-05-01 DIAGNOSIS — E119 Type 2 diabetes mellitus without complications: Secondary | ICD-10-CM | POA: Diagnosis not present

## 2017-05-01 DIAGNOSIS — Z961 Presence of intraocular lens: Secondary | ICD-10-CM | POA: Diagnosis not present

## 2017-05-01 DIAGNOSIS — H401131 Primary open-angle glaucoma, bilateral, mild stage: Secondary | ICD-10-CM | POA: Diagnosis not present

## 2017-05-16 ENCOUNTER — Other Ambulatory Visit: Payer: Self-pay | Admitting: Family Medicine

## 2017-05-30 DIAGNOSIS — R0981 Nasal congestion: Secondary | ICD-10-CM | POA: Diagnosis not present

## 2017-05-30 DIAGNOSIS — R05 Cough: Secondary | ICD-10-CM | POA: Diagnosis not present

## 2017-06-15 LAB — HM DIABETES EYE EXAM

## 2017-06-22 ENCOUNTER — Encounter: Payer: Self-pay | Admitting: Endocrinology

## 2017-06-22 ENCOUNTER — Ambulatory Visit (INDEPENDENT_AMBULATORY_CARE_PROVIDER_SITE_OTHER): Payer: Medicare Other | Admitting: Endocrinology

## 2017-06-22 VITALS — BP 126/68 | HR 94 | Wt 166.8 lb

## 2017-06-22 DIAGNOSIS — E119 Type 2 diabetes mellitus without complications: Secondary | ICD-10-CM | POA: Diagnosis not present

## 2017-06-22 LAB — POCT GLYCOSYLATED HEMOGLOBIN (HGB A1C): HEMOGLOBIN A1C: 7.1

## 2017-06-22 MED ORDER — SITAGLIPTIN PHOSPHATE 100 MG PO TABS: 100.0000 mg | ORAL_TABLET | Freq: Every day | ORAL | 11 refills | Status: DC

## 2017-06-22 NOTE — Progress Notes (Signed)
Subjective:    Patient ID: [name], female    DOB: 01/31/1941, 77 y.o.   MRN: [id number]  HPI Pt returns for f/u of diabetes mellitus: DM type: 2 Dx'ed: 7106 Complications: none Therapy: 2 oral meds. GDM: never DKA: never Severe hypoglycemia: never Pancreatitis: never Other: she has never been on insulin Interval history: pt states she feels well in general.  She takes [place] 1/2 tab per day.   Past Medical History:  Diagnosis Date  . Allergic rhinitis   . Asthma   . Diabetes mellitus    just changed from injection to metformin ? month  . Dysrhythmia    palpitations  . Glaucoma   . History of kidney stones   . Hyperlipemia   . Migraines   . Palpitations   . Pneumonia    hx  . Urinary tract infection     Past Surgical History:  Procedure Laterality Date  . ABDOMINAL HYSTERECTOMY     Has ovaries  . BACK SURGERY  1987   lumb  . BREAST SURGERY Left 07/2016   bx- needle in breast  . CHOLECYSTECTOMY  09/26/13  . CLOSED REDUCTION NASAL FRACTURE  04/29/2012   Procedure: CLOSED REDUCTION NASAL FRACTURE;  Surgeon: Jodi Marble, MD;  Location: [place];  Service: ENT;  Laterality: N/A;  WITH STABILIZATION  . COLONOSCOPY    . Deviated septum repair    . EYE SURGERY Bilateral    cataracts  . LITHOTRIPSY    . REVERSE SHOULDER ARTHROPLASTY Right 09/15/2016   Procedure: RIGHT REVERSE SHOULDER ARTHROPLASTY;  Surgeon: Netta Cedars, MD;  Location: [place];  Service: Orthopedics;  Laterality: Right;  . ROTATOR CUFF REPAIR     right  . TONSILLECTOMY      Social History   Socioeconomic History  . Marital status: Married    Spouse name: Not on file  . Number of children: 2  . Years of education: Not on file  . Highest education level: Not on file  Social Needs  . Financial resource strain: Not on file  . Food insecurity - worry: Not on file  . Food insecurity - inability: Not on file  . Transportation needs - medical: Not on file  . Transportation  needs - non-medical: Not on file  Occupational History  . Occupation: Retired -Sports coach: RETIRED  Tobacco Use  . Smoking status: Former Smoker    Packs/day: 0.10    Years: 4.00    Pack years: 0.40    Types: Cigarettes    Last attempt to quit: 06/19/1986    Years since quitting: 31.0  . Smokeless tobacco: Never Used  . Tobacco comment: smoked in college years ago  Substance and Sexual Activity  . Alcohol use: Yes    Alcohol/week: 0.0 oz    Comment: 1-2 glasses week variety beer occ scotch  . Drug use: No  . Sexual activity: Not on file  Other Topics Concern  . Not on file  Social History Narrative   Daily caffeine     Current Outpatient Medications on File Prior to Visit  Medication Sig Dispense Refill  . albuterol (PROAIR HFA) 108 (90 Base) MCG/ACT inhaler Inhale 2 puffs into the lungs every 4 (four) hours as needed for wheezing or shortness of breath. 1 Inhaler 6  . atorvastatin (LIPITOR) 20 MG tablet TAKE ONE (1) TABLET EACH DAY 30 tablet 5  . Biotin 5000 MCG CAPS Take 1 capsule by mouth daily.    Marland Kitchen  Chlorpheniramine Maleate (ALLERGY PO) Take 1 tablet by mouth daily.    . citalopram (CELEXA) 40 MG tablet TAKE ONE (1) TABLET EACH DAY 30 tablet 5  . fenofibrate (TRICOR) 145 MG tablet TAKE ONE (1) TABLET EACH DAY AFTER BREKFAST 30 tablet 6  . fluticasone (FLONASE) 50 MCG/ACT nasal spray Place 1 spray into both nostrils daily. 16 g 2  . LUMIGAN 0.01 % SOLN Place 1 drop into both eyes at bedtime.     . metFORMIN (GLUCOPHAGE-XR) 500 MG 24 hr tablet Take 4 tablets (2,000 mg total) by mouth daily with breakfast. (Patient taking differently: Take 2,000 mg by mouth daily with breakfast. Takes 4 tablets at once) 120 tablet 11  . montelukast (SINGULAIR) 10 MG tablet Take 1 tablet (10 mg total) by mouth at bedtime. 30 tablet 5  . ONE TOUCH ULTRA TEST test strip USE 1 TIME A DAY 50 each 12  . ONETOUCH DELICA LANCETS 29B MISC Use one lancet to test sugars once daily. E11.9 100  each 12  . Vitamin D, Ergocalciferol, (DRISDOL) 50000 units CAPS capsule TAKE ONE CAPSULE BY MOUTH EVERY 7 DAYS. 12 capsule 0   No current facility-administered medications on file prior to visit.     Allergies  Allergen Reactions  . Papain Anaphylaxis    Takes an extreme concentration  . Papaya Derivatives Anaphylaxis  . Mushroom Ext Cmplx(Shiitake-Reishi-Mait) Other (See Comments)    Head congestion and sore throat  . Venlafaxine Other (See Comments)    UNSPECIFIED REACTION  Pt states she can take Name brand. Lethargy    Family History  Problem Relation Age of Onset  . Diabetes Father   . Colon cancer Neg Hx   . Esophageal cancer Neg Hx   . Rectal cancer Neg Hx   . Stomach cancer Neg Hx   . Breast cancer Neg Hx     BP 126/68 (BP Location: Left Arm, Patient Position: Sitting, Cuff Size: Normal)   Pulse 94   Wt 166 lb 12.8 oz (75.7 kg)   SpO2 95%   BMI 29.55 kg/m    Review of Systems She denies hypoglycemia    Objective:   Physical Exam VITAL SIGNS:  See vs page GENERAL: no distress Pulses: dorsalis pedis intact bilat.   MSK: no deformity of the feet CV: trace bilat leg edema.   Skin:  no ulcer on the feet.  normal color and temp on the feet. Neuro: sensation is intact to touch on the feet.   A1c=7.1%  Lab Results  Component Value Date   CREATININE 0.72 01/12/2017   BUN 10 01/12/2017   NA 139 01/12/2017   K 4.2 01/12/2017   CL 106 01/12/2017   CO2 23 01/12/2017   Lab Results  Component Value Date   TSH 2.24 01/12/2017       Assessment & Plan:  Type 2 DM: she needs increased rx, if it can be done with a regimen that avoids or minimizes hypoglycemia.   Patient Instructions  check your blood sugar once a day.  vary the time of day when you check, between before the 3 meals, and at bedtime.  also check if you have symptoms of your blood sugar being too high or too low.  please keep a record of the readings and bring it to your next appointment here  (or you can bring the meter itself).  You can write it on any piece of paper.  please call us sooner if your blood sugar goes below  70, or if you have a lot of readings over 200.  Please continue the same metformin, and: Increase the januvia to a whole pill per day.   Please come back for a follow-up appointment in 4 months.

## 2017-06-22 NOTE — Patient Instructions (Addendum)
check your blood sugar once a day.  vary the time of day when you check, between before the 3 meals, and at bedtime.  also check if you have symptoms of your blood sugar being too high or too low.  please keep a record of the readings and bring it to your next appointment here (or you can bring the meter itself).  You can write it on any piece of paper.  please call us sooner if your blood sugar goes below 70, or if you have a lot of readings over 200.  Please continue the same metformin, and: Increase the januvia to a whole pill per day.   Please come back for a follow-up appointment in 4 months.

## 2017-06-25 DIAGNOSIS — L299 Pruritus, unspecified: Secondary | ICD-10-CM | POA: Diagnosis not present

## 2017-06-29 DIAGNOSIS — R21 Rash and other nonspecific skin eruption: Secondary | ICD-10-CM | POA: Diagnosis not present

## 2017-06-29 DIAGNOSIS — L299 Pruritus, unspecified: Secondary | ICD-10-CM | POA: Diagnosis not present

## 2017-07-05 DIAGNOSIS — R21 Rash and other nonspecific skin eruption: Secondary | ICD-10-CM | POA: Diagnosis not present

## 2017-07-13 ENCOUNTER — Ambulatory Visit: Payer: Self-pay | Admitting: *Deleted

## 2017-07-13 NOTE — Telephone Encounter (Signed)
Pt calling stating that she has experienced a generalized rash to the body of unknown cause. Pt states the rash is now on her left hand, under breast, across back and abdomen and complaints of severe itching. Pt states she has tried benadryl with little improvement. Pt was seen previously by dermatologist and was given triamcinolone aceta, famotidine and was also given shampoo clobetasol propanote with no improvement in itching. Pt scheduled for appt on Monday with Dr. Birdie Riddle, per pt's request. Pt preferred to wait to be seen by Dr. Birdie Riddle on Monday instead of seeing another provider over the weekend.  Reason for Disposition . SEVERE itching (i.e., interferes with sleep, normal activities or school)  Answer Assessment - Initial Assessment Questions 1. APPEARANCE of RASH: "Describe the rash." (e.g., spots, blisters, raised areas, skin peeling, scaly)    Pt states she has small spots across areas different areas of the body and has developed scabs to the area due to scratching.  2. SIZE: "How big are the spots?" (e.g., tip of pen, eraser, coin; inches, centimeters)     Pt describes the rash as small little bumps 3. LOCATION: "Where is the rash located?"     Left hand, across abdomen, under breast, and starting to form on face around nose 4. COLOR: "What color is the rash?" (Note: It is difficult to assess rash color in people with darker-colored skin. When this situation occurs, simply ask the caller to describe what they see.)     Red due to scratching 5. ONSET: "When did the rash begin?"     3 weeks ago 6. FEVER: "Do you have a fever?" If so, ask: "What is your temperature, how was it measured, and when did it start?"     No 7. ITCHING: "Does the rash itch?" If so, ask: "How bad is the itch?" (Scale 1-10; or mild, moderate, severe)     Severe 8. CAUSE: "What do you think is causing the rash?"     Unsure, pt has taken iron 9. MEDICATION FACTORS: "Have you started any new medications within the  last 2 weeks?" (e.g., antibiotics)      No 10. OTHER SYMPTOMS: "Do you have any other symptoms?" (e.g., dizziness, headache, sore throat, joint pain)       No  Protocols used: RASH OR REDNESS - [facility]

## 2017-07-16 ENCOUNTER — Other Ambulatory Visit: Payer: Self-pay

## 2017-07-16 ENCOUNTER — Encounter: Payer: Self-pay | Admitting: Family Medicine

## 2017-07-16 ENCOUNTER — Ambulatory Visit (INDEPENDENT_AMBULATORY_CARE_PROVIDER_SITE_OTHER): Payer: Medicare Other | Admitting: Family Medicine

## 2017-07-16 VITALS — BP 126/72 | HR 76 | Temp 98.1°F | Resp 16 | Ht 63.0 in | Wt 168.5 lb

## 2017-07-16 DIAGNOSIS — R238 Other skin changes: Secondary | ICD-10-CM

## 2017-07-16 DIAGNOSIS — R21 Rash and other nonspecific skin eruption: Secondary | ICD-10-CM

## 2017-07-16 MED ORDER — PREDNISONE 10 MG PO TABS
ORAL_TABLET | ORAL | 0 refills | Status: DC
Start: 2017-07-16 — End: 2017-09-13

## 2017-07-16 MED ORDER — HYDROXYZINE HCL 25 MG PO TABS: 25.0000 mg | ORAL_TABLET | Freq: Three times a day (TID) | ORAL | 0 refills | Status: DC | PRN

## 2017-07-16 NOTE — Patient Instructions (Signed)
Schedule a cholesterol follow up and your Medicare Wellness Visit at the same time in the near future START the Prednisone as directed- take w/ food.  This will cause a temporary increase in sugars so be mindful of your eating Use the Hydroxyzine as needed for itching- can cause drowsiness so best used at night Continue to use hydrocortisone cream (OTC) on the very itchy areas- can use twice daily The scalp appears to be quite dry- try OTC dry scalp shampoo to improve the itching Call with any questions or concerns Hang in there!!!

## 2017-07-16 NOTE — Progress Notes (Signed)
   Subjective:    Patient ID: [name], female    DOB: 1940-08-27, 77 y.o.   MRN: [id number]  HPI Rash- 'my scalp is driving me crazy.  It itches incessantly'.  'i'm clawing myself to death'.  Pt reports itching is from waist up to scalp.  Pt was given Ketoconazole and Clobetasol by Derm for the scalp which has not helped.  Body has been itching x3-4 weeks.  No change in sleeping arrangements.  No change in laundry detergents.   Review of Systems For ROS see HPI     Objective:   Physical Exam  Constitutional: She is oriented to person, place, and time. She appears well-developed and well-nourished. No distress.  Neurological: She is alert and oriented to person, place, and time.  Skin: Skin is warm and dry. Rash (diffuse maculopapular rash on trunk) noted.  Dry patches on scalp w/ flaking but no lesions or rash  Psychiatric: She has a normal mood and affect. Her behavior is normal. Thought content normal.  Vitals reviewed.         Assessment & Plan:  Rash- new.  Start low dose prednisone taper as well as hydroxyzine for itching.  Reviewed that excessively dry skin can cause similar symptoms and she does report that she only moisturizers arms and legs.  Reviewed supportive care and red flags that should prompt return.  Pt expressed understanding and is in agreement w/ plan.   Dry Scalp- encouraged her to change her shampoo to a dry scalp formulation to help w/ itching.  Pt expressed understanding and is in agreement w/ plan.

## 2017-08-15 ENCOUNTER — Encounter: Payer: Self-pay | Admitting: Family Medicine

## 2017-08-15 ENCOUNTER — Ambulatory Visit (INDEPENDENT_AMBULATORY_CARE_PROVIDER_SITE_OTHER): Payer: Medicare Other | Admitting: Family Medicine

## 2017-08-15 ENCOUNTER — Ambulatory Visit (INDEPENDENT_AMBULATORY_CARE_PROVIDER_SITE_OTHER): Payer: Medicare Other

## 2017-08-15 ENCOUNTER — Other Ambulatory Visit: Payer: Self-pay

## 2017-08-15 VITALS — BP 140/76 | HR 78 | Temp 97.4°F | Resp 16 | Ht 63.0 in | Wt 169.6 lb

## 2017-08-15 VITALS — BP 136/78 | HR 78 | Temp 97.4°F | Resp 16 | Wt 169.4 lb

## 2017-08-15 DIAGNOSIS — E782 Mixed hyperlipidemia: Secondary | ICD-10-CM | POA: Diagnosis not present

## 2017-08-15 DIAGNOSIS — Z0000 Encounter for general adult medical examination without abnormal findings: Secondary | ICD-10-CM

## 2017-08-15 DIAGNOSIS — E2839 Other primary ovarian failure: Secondary | ICD-10-CM

## 2017-08-15 DIAGNOSIS — E119 Type 2 diabetes mellitus without complications: Secondary | ICD-10-CM

## 2017-08-15 DIAGNOSIS — R21 Rash and other nonspecific skin eruption: Secondary | ICD-10-CM

## 2017-08-15 DIAGNOSIS — Z23 Encounter for immunization: Secondary | ICD-10-CM | POA: Diagnosis not present

## 2017-08-15 MED ORDER — ZOSTER VAC RECOMB ADJUVANTED 50 MCG/0.5ML IM SUSR: 0.5000 mL | Freq: Once | INTRAMUSCULAR | 1 refills | Status: DC

## 2017-08-15 MED ORDER — TRIAMCINOLONE ACETONIDE 0.1 % EX OINT: 1.0000 "application " | TOPICAL_OINTMENT | Freq: Two times a day (BID) | CUTANEOUS | 1 refills | Status: DC

## 2017-08-15 NOTE — Patient Instructions (Signed)
Follow up in 6 months to recheck cholesterol We'll notify you of your lab results and make any changes if needed Continue to work on healthy diet and regular exercise- you can do it! Apply the Triamcinolone twice daily on the red, itchy areas Call with any questions or concerns [place]!!!

## 2017-08-15 NOTE — Progress Notes (Addendum)
Subjective:   [name] is a 77 y.o. female who presents for Medicare Annual (Subsequent) preventive examination.  Review of Systems:  No ROS.  Medicare Wellness Visit. Additional risk factors are reflected in the social history.  Cardiac Risk Factors include: advanced age (>53men, >31 women);diabetes mellitus;dyslipidemia;obesity (BMI >30kg/m2);sedentary lifestyle   Sleep patterns: Difficulty falling asleep. Occasionally takes OTC.  Home Safety/Smoke Alarms: Feels safe in home. Smoke alarms in place.  Living environment; residence and Firearm Safety: Lives with husband in one story home ([place]).  Seat Belt Safety/Bike Helmet: Wears seat belt.   Female:   Pap-N/A       Mammo-01/11/2017, Benign.       Dexa scan-01/09/2012, Osteopenia. Ordered today. [facility]       CCS-Colonoscopy 10/16/2011, normal. Recall 10 years.      Objective:     Vitals: BP 140/76 (BP Location: Left Arm, Patient Position: Sitting, Cuff Size: Normal)   Pulse 78   Temp (!) 97.4 F (36.3 C) (Temporal)   Resp 16   Ht 5\' 3"  (1.6 m)   Wt 169 lb 9.6 oz (76.9 kg)   SpO2 98%   BMI 30.04 kg/m   Body mass index is 30.04 kg/m.  Advanced Directives 08/15/2017 09/08/2016 07/08/2014 05/16/2013 04/25/2012 01/29/2012  Does Patient Have a Medical Advance Directive? Yes Yes Yes Patient would not like information Patient has advance directive, copy not in chart Patient has advance directive, copy not in chart  Type of Advance Directive [place];Living will [place];Living will Living will;Healthcare Power of [place];Living will  Does patient want to make changes to medical advance directive? - No - Patient declined - - - -  Copy of [place] in Chart? Yes Yes - - - -  Pre-existing out of facility DNR order (yellow form or pink MOST form) - - - No - No    Tobacco Social History   Tobacco Use  Smoking Status Former  Smoker  . Packs/day: 0.10  . Years: 4.00  . Pack years: 0.40  . Types: Cigarettes  . Last attempt to quit: 06/19/1986  . Years since quitting: 31.1  Smokeless Tobacco Never Used  Tobacco Comment   smoked in college years ago     Counseling given: Not Answered Comment: smoked in college years ago   Past Medical History:  Diagnosis Date  . Allergic rhinitis   . Asthma   . Diabetes mellitus    just changed from injection to metformin ? month  . Dysrhythmia    palpitations  . Glaucoma   . History of kidney stones   . Hyperlipemia   . Migraines   . Palpitations   . Pneumonia    hx  . Urinary tract infection    Past Surgical History:  Procedure Laterality Date  . ABDOMINAL HYSTERECTOMY     Has ovaries  . BACK SURGERY  1987   lumb  . BREAST SURGERY Left 07/2016   bx- needle in breast  . CHOLECYSTECTOMY  09/26/13  . CLOSED REDUCTION NASAL FRACTURE  04/29/2012   Procedure: CLOSED REDUCTION NASAL FRACTURE;  Surgeon: Jodi Marble, MD;  Location: [facility];  Service: ENT;  Laterality: N/A;  WITH STABILIZATION  . COLONOSCOPY    . Deviated septum repair    . EYE SURGERY Bilateral    cataracts  . LITHOTRIPSY    . REVERSE SHOULDER ARTHROPLASTY Right 09/15/2016   Procedure: RIGHT  REVERSE SHOULDER ARTHROPLASTY;  Surgeon: Netta Cedars, MD;  Location: [facility];  Service: Orthopedics;  Laterality: Right;  . ROTATOR CUFF REPAIR     right  . TONSILLECTOMY     Family History  Problem Relation Age of Onset  . Diabetes Father   . Colon cancer Neg Hx   . Esophageal cancer Neg Hx   . Rectal cancer Neg Hx   . Stomach cancer Neg Hx   . Breast cancer Neg Hx    Social History   Socioeconomic History  . Marital status: Married    Spouse name: None  . Number of children: 2  . Years of education: None  . Highest education level: None  Social Needs  . Financial resource strain: None  . Food insecurity - worry: None  . Food insecurity - inability: None  .  Transportation needs - medical: None  . Transportation needs - non-medical: None  Occupational History  . Occupation: Retired -Sports coach: RETIRED  Tobacco Use  . Smoking status: Former Smoker    Packs/day: 0.10    Years: 4.00    Pack years: 0.40    Types: Cigarettes    Last attempt to quit: 06/19/1986    Years since quitting: 31.1  . Smokeless tobacco: Never Used  . Tobacco comment: smoked in college years ago  Substance and Sexual Activity  . Alcohol use: Yes    Alcohol/week: 0.0 oz    Comment: 1-2 glasses week variety beer occ scotch  . Drug use: No  . Sexual activity: None  Other Topics Concern  . None  Social History Narrative   Daily caffeine     Outpatient Encounter Medications as of 08/15/2017  Medication Sig  . atorvastatin (LIPITOR) 20 MG tablet TAKE ONE (1) TABLET EACH DAY  . Biotin 5000 MCG CAPS Take 1 capsule by mouth daily.  . citalopram (CELEXA) 40 MG tablet TAKE ONE (1) TABLET EACH DAY  . famotidine (PEPCID) 20 MG tablet Take 20 mg by mouth 2 (two) times daily.  . fenofibrate (TRICOR) 145 MG tablet TAKE ONE (1) TABLET EACH DAY AFTER BREKFAST  . fexofenadine (ALLEGRA) 180 MG tablet Take 180 mg by mouth daily.  . fluticasone (FLONASE) 50 MCG/ACT nasal spray Place 1 spray into both nostrils daily.  Marland Kitchen LUMIGAN 0.01 % SOLN Place 1 drop into both eyes at bedtime.   . metFORMIN (GLUCOPHAGE-XR) 500 MG 24 hr tablet Take 4 tablets (2,000 mg total) by mouth daily with breakfast. (Patient taking differently: Take 2,000 mg by mouth daily with breakfast. Takes 4 tablets at once)  . ONE TOUCH ULTRA TEST test strip USE 1 TIME A DAY  . ONETOUCH DELICA LANCETS 90W MISC Use one lancet to test sugars once daily. E11.9  . OVER THE COUNTER MEDICATION allertec  . sitaGLIPtin (JANUVIA) 100 MG tablet Take 1 tablet (100 mg total) by mouth daily.  . Vitamin D, Ergocalciferol, (DRISDOL) 50000 units CAPS capsule TAKE ONE CAPSULE BY MOUTH EVERY 7 DAYS.  Marland Kitchen albuterol (PROAIR HFA)  108 (90 Base) MCG/ACT inhaler Inhale 2 puffs into the lungs every 4 (four) hours as needed for wheezing or shortness of breath. (Patient not taking: Reported on 08/15/2017)  . Chlorpheniramine Maleate (ALLERGY PO) Take 1 tablet by mouth daily.  . hydrOXYzine (ATARAX/VISTARIL) 25 MG tablet Take 1 tablet (25 mg total) by mouth 3 (three) times daily as needed.  . montelukast (SINGULAIR) 10 MG tablet Take 1 tablet (10 mg total) by mouth at bedtime.  Marland Kitchen  predniSONE (DELTASONE) 10 MG tablet 3 tabs x3 days and then 2 tabs x3 days and then 1 tab x3 days.  Take w/ food.  . Zoster Vaccine Adjuvanted [facility]) injection Inject 0.5 mLs into the muscle once for 1 dose.   No facility-administered encounter medications on file as of 08/15/2017.     Activities of Daily Living In your present state of health, do you have any difficulty performing the following activities: 08/15/2017 08/15/2017  Hearing? N N  Comment - -  Vision? N N  Difficulty concentrating or making decisions? N N  Walking or climbing stairs? N N  Dressing or bathing? N N  Doing errands, shopping? N N  Preparing Food and eating ? - N  Using the Toilet? - N  In the past six months, have you accidently leaked urine? - N  Do you have problems with loss of bowel control? - N  Managing your Medications? - N  Managing your Finances? - N  Housekeeping or managing your Housekeeping? - N  Some recent data might be hidden    Patient Care Team: Midge Minium, MD as PCP - General (Family Medicine) Elsie Stain, MD as Consulting Physician (Pulmonary Disease) Lonzo Candy, AUD (Audiology) Netta Cedars, MD as Consulting Physician (Orthopedic Surgery) Hollar, Katharine Look, MD as Referring Physician (Dermatology)    Assessment:   This is a routine wellness examination for [place].  Exercise Activities and Dietary recommendations Current Exercise Habits: Structured exercise class([place]; ), Type of exercise: yoga;Other - see  comments;walking, Time (Minutes): 40, Frequency (Times/Week): 6, Weekly Exercise (Minutes/Week): 240, Exercise limited by: None identified   Diet (meal preparation, eat out, water intake, caffeinated beverages, dairy products, fruits and vegetables): Drinks tea and water.   Eats heart healthy diet. Recently has experienced increased hunger.   Goals    . weight     Lose weight (10 lbs) by controlling portion control.        Fall Risk Fall Risk  08/15/2017 08/15/2017 02/29/2016 08/19/2015 05/19/2014  Falls in the past year? No No No No No    Depression Screen PHQ 2/9 Scores 08/15/2017 08/15/2017 07/16/2017 02/29/2016  PHQ - 2 Score 0 0 0 0  PHQ- 9 Score 0 - 0 -     Cognitive Function       Ad8 score reviewed for issues:  Issues making decisions: no  Less interest in hobbies / activities: no  Repeats questions, stories (family complaining): no  Trouble using ordinary gadgets (microwave, computer, phone): no  Forgets the month or year: no  Mismanaging finances: no  Remembering appts: no  Daily problems with thinking and/or memory: no Ad8 score is=0       Immunization History  Administered Date(s) Administered  . Influenza Split 04/11/2011  . Influenza,inj,Quad PF,6+ Mos 04/10/2013, 03/10/2014, 04/20/2015, 02/29/2016, 03/20/2017  . Pneumococcal Conjugate-13 08/27/2014  . Pneumococcal Polysaccharide-23 04/19/2010  . Tdap 04/20/2012     Screening Tests Health Maintenance  Topic Date Due  . URINE MICROALBUMIN  02/28/2017  . HEMOGLOBIN A1C  12/20/2017  . MAMMOGRAM  01/11/2018  . OPHTHALMOLOGY EXAM  06/15/2018  . FOOT EXAM  06/22/2018  . TETANUS/TDAP  04/20/2022  . INFLUENZA VACCINE  Completed  . DEXA SCAN  Completed  . PNA vac Low Risk Adult  Completed       Plan:    Schedule bone scan with mammogram.   Shingles vaccine at pharmacy.   Continue doing brain stimulating activities (puzzles, reading, adult coloring books, staying  active) to keep memory  sharp.    I have personally reviewed and noted the following in the patient's chart:   . Medical and social history . Use of alcohol, tobacco or illicit drugs  . Current medications and supplements . Functional ability and status . Nutritional status . Physical activity . Advanced directives . List of other physicians . Hospitalizations, surgeries, and ER visits in previous 12 months . Vitals . Screenings to include cognitive, depression, and falls . Referrals and appointments  In addition, I have reviewed and discussed with patient certain preventive protocols, quality metrics, and best practice recommendations. A written personalized care plan for preventive services as well as general preventive health recommendations were provided to patient.     Gerilyn Nestle, RN  08/15/2017  Reviewed documentation provided by RN and agree w/ above.  Annye Asa, MD

## 2017-08-15 NOTE — Patient Instructions (Addendum)
Schedule bone scan with mammogram.   Shingles vaccine at pharmacy.   Continue doing brain stimulating activities (puzzles, reading, adult coloring books, staying active) to keep memory sharp.   Health Maintenance, Female Adopting a healthy lifestyle and getting preventive care can go a long way to promote health and wellness. Talk with your health care provider about what schedule of regular examinations is right for you. This is a good chance for you to check in with your provider about disease prevention and staying healthy. In between checkups, there are plenty of things you can do on your own. Experts have done a lot of research about which lifestyle changes and preventive measures are most likely to keep you healthy. Ask your health care provider for more information. Weight and diet Eat a healthy diet  Be sure to include plenty of vegetables, fruits, low-fat dairy products, and lean protein.  Do not eat a lot of foods high in solid fats, added sugars, or salt.  Get regular exercise. This is one of the most important things you can do for your health. ? Most adults should exercise for at least 150 minutes each week. The exercise should increase your heart rate and make you sweat (moderate-intensity exercise). ? Most adults should also do strengthening exercises at least twice a week. This is in addition to the moderate-intensity exercise.  Maintain a healthy weight  Body mass index (BMI) is a measurement that can be used to identify possible weight problems. It estimates body fat based on height and weight. Your health care provider can help determine your BMI and help you achieve or maintain a healthy weight.  For females 11 years of age and older: ? A BMI below 18.5 is considered underweight. ? A BMI of 18.5 to 24.9 is normal. ? A BMI of 25 to 29.9 is considered overweight. ? A BMI of 30 and above is considered obese.  Watch levels of cholesterol and blood lipids  You should  start having your blood tested for lipids and cholesterol at 78 years of age, then have this test every 5 years.  You may need to have your cholesterol levels checked more often if: ? Your lipid or cholesterol levels are high. ? You are older than 78 years of age. ? You are at high risk for heart disease.  Cancer screening Lung Cancer  Lung cancer screening is recommended for adults 28-21 years old who are at high risk for lung cancer because of a history of smoking.  A yearly low-dose CT scan of the lungs is recommended for people who: ? Currently smoke. ? Have quit within the past 15 years. ? Have at least a 30-pack-year history of smoking. A pack year is smoking an average of one pack of cigarettes a day for 1 year.  Yearly screening should continue until it has been 15 years since you quit.  Yearly screening should stop if you develop a health problem that would prevent you from having lung cancer treatment.  Breast Cancer  Practice breast self-awareness. This means understanding how your breasts normally appear and feel.  It also means doing regular breast self-exams. Let your health care provider know about any changes, no matter how small.  If you are in your 20s or 30s, you should have a clinical breast exam (CBE) by a health care provider every 1-3 years as part of a regular health exam.  If you are 76 or older, have a CBE every year. Also consider having a  breast X-ray (mammogram) every year.  If you have a family history of breast cancer, talk to your health care provider about genetic screening.  If you are at high risk for breast cancer, talk to your health care provider about having an MRI and a mammogram every year.  Breast cancer gene (BRCA) assessment is recommended for women who have family members with BRCA-related cancers. BRCA-related cancers include: ? Breast. ? Ovarian. ? Tubal. ? Peritoneal cancers.  Results of the assessment will determine the need  for genetic counseling and BRCA1 and BRCA2 testing.  Cervical Cancer Your health care provider may recommend that you be screened regularly for cancer of the pelvic organs (ovaries, uterus, and vagina). This screening involves a pelvic examination, including checking for microscopic changes to the surface of your cervix (Pap test). You may be encouraged to have this screening done every 3 years, beginning at age 19.  For women ages 54-65, health care providers may recommend pelvic exams and Pap testing every 3 years, or they may recommend the Pap and pelvic exam, combined with testing for human papilloma virus (HPV), every 5 years. Some types of HPV increase your risk of cervical cancer. Testing for HPV may also be done on women of any age with unclear Pap test results.  Other health care providers may not recommend any screening for nonpregnant women who are considered low risk for pelvic cancer and who do not have symptoms. Ask your health care provider if a screening pelvic exam is right for you.  If you have had past treatment for cervical cancer or a condition that could lead to cancer, you need Pap tests and screening for cancer for at least 20 years after your treatment. If Pap tests have been discontinued, your risk factors (such as having a new sexual partner) need to be reassessed to determine if screening should resume. Some women have medical problems that increase the chance of getting cervical cancer. In these cases, your health care provider may recommend more frequent screening and Pap tests.  Colorectal Cancer  This type of cancer can be detected and often prevented.  Routine colorectal cancer screening usually begins at 78 years of age and continues through 78 years of age.  Your health care provider may recommend screening at an earlier age if you have risk factors for colon cancer.  Your health care provider may also recommend using home test kits to check for hidden blood in  the stool.  A small camera at the end of a tube can be used to examine your colon directly (sigmoidoscopy or colonoscopy). This is done to check for the earliest forms of colorectal cancer.  Routine screening usually begins at age 36.  Direct examination of the colon should be repeated every 5-10 years through 78 years of age. However, you may need to be screened more often if early forms of precancerous polyps or small growths are found.  Skin Cancer  Check your skin from head to toe regularly.  Tell your health care provider about any new moles or changes in moles, especially if there is a change in a mole's shape or color.  Also tell your health care provider if you have a mole that is larger than the size of a pencil eraser.  Always use sunscreen. Apply sunscreen liberally and repeatedly throughout the day.  Protect yourself by wearing long sleeves, pants, a wide-brimmed hat, and sunglasses whenever you are outside.  Heart disease, diabetes, and high blood pressure  High  blood pressure causes heart disease and increases the risk of stroke. High blood pressure is more likely to develop in: ? People who have blood pressure in the high end of the normal range (130-139/85-89 mm Hg). ? People who are overweight or obese. ? People who are African American.  If you are 43-71 years of age, have your blood pressure checked every 3-5 years. If you are 34 years of age or older, have your blood pressure checked every year. You should have your blood pressure measured twice-once when you are at a hospital or clinic, and once when you are not at a hospital or clinic. Record the average of the two measurements. To check your blood pressure when you are not at a hospital or clinic, you can use: ? An automated blood pressure machine at a pharmacy. ? A home blood pressure monitor.  If you are between 36 years and 71 years old, ask your health care provider if you should take aspirin to prevent  strokes.  Have regular diabetes screenings. This involves taking a blood sample to check your fasting blood sugar level. ? If you are at a normal weight and have a low risk for diabetes, have this test once every three years after 78 years of age. ? If you are overweight and have a high risk for diabetes, consider being tested at a younger age or more often. Preventing infection Hepatitis B  If you have a higher risk for hepatitis B, you should be screened for this virus. You are considered at high risk for hepatitis B if: ? You were born in a country where hepatitis B is common. Ask your health care provider which countries are considered high risk. ? Your parents were born in a high-risk country, and you have not been immunized against hepatitis B (hepatitis B vaccine). ? You have HIV or AIDS. ? You use needles to inject street drugs. ? You live with someone who has hepatitis B. ? You have had sex with someone who has hepatitis B. ? You get hemodialysis treatment. ? You take certain medicines for conditions, including cancer, organ transplantation, and autoimmune conditions.  Hepatitis C  Blood testing is recommended for: ? Everyone born from [birth year] through 1966. ? Anyone with known risk factors for hepatitis C.  Sexually transmitted infections (STIs)  You should be screened for sexually transmitted infections (STIs) including gonorrhea and chlamydia if: ? You are sexually active and are younger than 78 years of age. ? You are older than 78 years of age and your health care provider tells you that you are at risk for this type of infection. ? Your sexual activity has changed since you were last screened and you are at an increased risk for chlamydia or gonorrhea. Ask your health care provider if you are at risk.  If you do not have HIV, but are at risk, it may be recommended that you take a prescription medicine daily to prevent HIV infection. This is called pre-exposure prophylaxis  (PrEP). You are considered at risk if: ? You are sexually active and do not regularly use condoms or know the HIV status of your partner(s). ? You take drugs by injection. ? You are sexually active with a partner who has HIV.  Talk with your health care provider about whether you are at high risk of being infected with HIV. If you choose to begin PrEP, you should first be tested for HIV. You should then be tested every 3 months for  as long as you are taking PrEP. Pregnancy  If you are premenopausal and you may become pregnant, ask your health care provider about preconception counseling.  If you may become pregnant, take 400 to 800 micrograms (mcg) of folic acid every day.  If you want to prevent pregnancy, talk to your health care provider about birth control (contraception). Osteoporosis and menopause  Osteoporosis is a disease in which the bones lose minerals and strength with aging. This can result in serious bone fractures. Your risk for osteoporosis can be identified using a bone density scan.  If you are 58 years of age or older, or if you are at risk for osteoporosis and fractures, ask your health care provider if you should be screened.  Ask your health care provider whether you should take a calcium or vitamin D supplement to lower your risk for osteoporosis.  Menopause may have certain physical symptoms and risks.  Hormone replacement therapy may reduce some of these symptoms and risks. Talk to your health care provider about whether hormone replacement therapy is right for you. Follow these instructions at home:  Schedule regular health, dental, and eye exams.  Stay current with your immunizations.  Do not use any tobacco products including cigarettes, chewing tobacco, or electronic cigarettes.  If you are pregnant, do not drink alcohol.  If you are breastfeeding, limit how much and how often you drink alcohol.  Limit alcohol intake to no more than 1 drink per day for  nonpregnant women. One drink equals 12 ounces of beer, 5 ounces of wine, or 1 ounces of hard liquor.  Do not use street drugs.  Do not share needles.  Ask your health care provider for help if you need support or information about quitting drugs.  Tell your health care provider if you often feel depressed.  Tell your health care provider if you have ever been abused or do not feel safe at home. This information is not intended to replace advice given to you by your health care provider. Make sure you discuss any questions you have with your health care provider. Document Released: 12/19/2010 Document Revised: 11/11/2015 Document Reviewed: 03/09/2015 Elsevier Interactive Patient Education  [employer].

## 2017-08-15 NOTE — Progress Notes (Signed)
   Subjective:    Patient ID: [name], female    DOB: [birth year], 77 y.o.   MRN: [id number]  HPI Hyperlipidemia- chronic problem, on Lipitor 20mg  on Fenofibrate 145mg  daily w/ hx of good control.  Denies CP, SOB, abd pain, N/V, myalgias.  DM- pt is overdue for microalbumin.  Will order for Dr Loanne Drilling  Rash- L flank, area is dry and red.  Takes 'a lot of baths'.  Some itching.  Applying lotion w/o relief.   Review of Systems For ROS see HPI     Objective:   Physical Exam  Constitutional: She is oriented to person, place, and time. She appears well-developed and well-nourished. No distress.  HENT:  Head: Normocephalic and atraumatic.  Eyes: Conjunctivae and EOM are normal. Pupils are equal, round, and reactive to light.  Neck: Normal range of motion. Neck supple. No thyromegaly present.  Cardiovascular: Normal rate, regular rhythm, normal heart sounds and intact distal pulses.  No murmur heard. Pulmonary/Chest: Effort normal and breath sounds normal. No respiratory distress.  Abdominal: Soft. She exhibits no distension. There is no tenderness.  Musculoskeletal: She exhibits no edema.  Lymphadenopathy:    She has no cervical adenopathy.  Neurological: She is alert and oriented to person, place, and time.  Skin: Skin is warm and dry. Rash (red, maculopapular area on L flank) noted.  Psychiatric: She has a normal mood and affect. Her behavior is normal.  Vitals reviewed.         Assessment & Plan:

## 2017-08-16 ENCOUNTER — Other Ambulatory Visit: Payer: Self-pay | Admitting: Family Medicine

## 2017-08-16 DIAGNOSIS — R945 Abnormal results of liver function studies: Principal | ICD-10-CM

## 2017-08-16 DIAGNOSIS — R7989 Other specified abnormal findings of blood chemistry: Secondary | ICD-10-CM

## 2017-08-16 LAB — MICROALBUMIN / CREATININE URINE RATIO
Creatinine, Urine: 89 mg/dL (ref 20–275)
Microalb Creat Ratio: 7 mcg/mg creat (ref <30)
Microalb, Ur: 0.6 mg/dL

## 2017-08-16 LAB — BASIC METABOLIC PANEL
BUN: 21 mg/dL (ref 7–25)
CHLORIDE: 106 mmol/L (ref 98–110)
CO2: 24 mmol/L (ref 20–32)
Calcium: 10.1 mg/dL (ref 8.6–10.4)
Creat: 0.9 mg/dL (ref 0.60–0.93)
GLUCOSE: 136 mg/dL — AB (ref 65–99)
Potassium: 4.2 mmol/L (ref 3.5–5.3)
Sodium: 139 mmol/L (ref 135–146)

## 2017-08-16 LAB — HEPATIC FUNCTION PANEL
AG RATIO: 1.9 (calc) (ref 1.0–2.5)
ALBUMIN MSPROF: 4.6 g/dL (ref 3.6–5.1)
ALT: 39 U/L — ABNORMAL HIGH (ref 6–29)
AST: 36 U/L — ABNORMAL HIGH (ref 10–35)
Alkaline phosphatase (APISO): 49 U/L (ref 33–130)
BILIRUBIN DIRECT: 0.1 mg/dL (ref 0.0–0.2)
BILIRUBIN INDIRECT: 0.3 mg/dL (ref 0.2–1.2)
BILIRUBIN TOTAL: 0.4 mg/dL (ref 0.2–1.2)
GLOBULIN: 2.4 g/dL (ref 1.9–3.7)
Total Protein: 7 g/dL (ref 6.1–8.1)

## 2017-08-16 LAB — LIPID PANEL
CHOLESTEROL: 135 mg/dL (ref <200)
HDL: 40 mg/dL — AB (ref >50)
LDL Cholesterol (Calc): 70 mg/dL (calc)
Non-HDL Cholesterol (Calc): 95 mg/dL (calc) (ref <130)
TRIGLYCERIDES: 173 mg/dL — AB (ref <150)
Total CHOL/HDL Ratio: 3.4 (calc) (ref <5.0)

## 2017-08-16 LAB — EXTRA LAV TOP TUBE

## 2017-08-17 NOTE — Assessment & Plan Note (Signed)
Chronic problem.  Pt is overdue for microalbumin.  Will order today.  She is following w/ Endo for her management.

## 2017-08-17 NOTE — Assessment & Plan Note (Signed)
Chronic problem.  Tolerating statin and Fenofibrate w/o difficulty.  Check labs.  Adjust meds prn

## 2017-08-17 NOTE — Assessment & Plan Note (Signed)
New.  Pt's red area is consistent w/ eczema.  Start Triamcinolone topically BID.  Pt expressed understanding and is in agreement w/ plan.

## 2017-08-20 ENCOUNTER — Other Ambulatory Visit: Payer: Self-pay | Admitting: General Practice

## 2017-08-20 MED ORDER — ATORVASTATIN CALCIUM 20 MG PO TABS: ORAL_TABLET | ORAL | 1 refills | Status: DC

## 2017-08-24 ENCOUNTER — Other Ambulatory Visit: Payer: Self-pay | Admitting: Family Medicine

## 2017-08-29 ENCOUNTER — Other Ambulatory Visit: Payer: Self-pay | Admitting: Endocrinology

## 2017-08-31 ENCOUNTER — Other Ambulatory Visit: Payer: Medicare Other

## 2017-09-13 ENCOUNTER — Encounter: Payer: Self-pay | Admitting: Family Medicine

## 2017-09-13 ENCOUNTER — Ambulatory Visit (INDEPENDENT_AMBULATORY_CARE_PROVIDER_SITE_OTHER): Payer: Medicare Other | Admitting: Family Medicine

## 2017-09-13 ENCOUNTER — Other Ambulatory Visit: Payer: Self-pay

## 2017-09-13 VITALS — BP 130/80 | HR 78 | Resp 16 | Ht 63.0 in | Wt 169.5 lb

## 2017-09-13 DIAGNOSIS — M7062 Trochanteric bursitis, left hip: Secondary | ICD-10-CM | POA: Diagnosis not present

## 2017-09-13 DIAGNOSIS — R945 Abnormal results of liver function studies: Secondary | ICD-10-CM

## 2017-09-13 DIAGNOSIS — M545 Low back pain, unspecified: Secondary | ICD-10-CM

## 2017-09-13 DIAGNOSIS — R7989 Other specified abnormal findings of blood chemistry: Secondary | ICD-10-CM

## 2017-09-13 LAB — POCT URINALYSIS DIPSTICK
BILIRUBIN UA: NEGATIVE
GLUCOSE UA: NEGATIVE
Ketones, UA: NEGATIVE
Nitrite, UA: NEGATIVE
Protein, UA: NEGATIVE
RBC UA: NEGATIVE
Spec Grav, UA: >=1.03 — AB (ref 1.010–1.025)
Urobilinogen, UA: 0.2 E.U./dL
pH, UA: 5.5 (ref 5.0–8.0)

## 2017-09-13 MED ORDER — NAPROXEN 250 MG PO TABS: 250.0000 mg | ORAL_TABLET | Freq: Two times a day (BID) | ORAL | 0 refills | Status: DC

## 2017-09-13 NOTE — Patient Instructions (Addendum)
Follow up as needed or as scheduled Your urine looks great!  No evidence of infection ICE!!! Start the Naproxen twice daily- take w/ food- for 7 to 10 days and then as needed If no improvement, let me know! Call with any questions or concerns Hang in there!!!

## 2017-09-13 NOTE — Progress Notes (Signed)
   Subjective:    Patient ID: [name], female    DOB: [birth year], 77 y.o.   MRN: [id number]  HPI Hip pain- sxs started 'about a month ago'.  This weekend, 'it didn't want to hold me up'.  L hip.  No known injury.  Pain improves when she doesn't bear weight.  Able to feel it 'when I roll over'.  Denies urinary sxs.  Pt has been doing a lot of lifting, squatting, bending over last 2 months w/ husband in the hospital.   Review of Systems For ROS see HPI     Objective:   Physical Exam  Constitutional: She is oriented to person, place, and time. She appears well-developed and well-nourished. No distress.  HENT:  Head: Normocephalic and atraumatic.  Musculoskeletal: She exhibits tenderness (TTP over L trochanteric bursa). She exhibits no deformity.  Full ROM of L hip w/ pain on internal/external rotation  Neurological: She is alert and oriented to person, place, and time.  Vitals reviewed.         Assessment & Plan:  L trochanteric bursitis- new.  Pt's sxs and PE consistent w/ bursitis/inflammation.  Start scheduled NSAIDs- will avoid prednisone due to DM.  Reviewed dx, tx, and need for short term NSAIDs w/ food.  Reviewed supportive care and red flags that should prompt return.  Pt expressed understanding and is in agreement w/ plan.

## 2017-09-14 ENCOUNTER — Telehealth: Payer: Self-pay | Admitting: Family Medicine

## 2017-09-14 LAB — HEPATIC FUNCTION PANEL
AG Ratio: 1.9 (calc) (ref 1.0–2.5)
ALKALINE PHOSPHATASE (APISO): 41 U/L (ref 33–130)
ALT: 26 U/L (ref 6–29)
AST: 32 U/L (ref 10–35)
Albumin: 4.3 g/dL (ref 3.6–5.1)
Bilirubin, Direct: 0.1 mg/dL (ref 0.0–0.2)
Globulin: 2.3 g/dL (calc) (ref 1.9–3.7)
Indirect Bilirubin: 0.2 mg/dL (calc) (ref 0.2–1.2)
TOTAL PROTEIN: 6.6 g/dL (ref 6.1–8.1)
Total Bilirubin: 0.3 mg/dL (ref 0.2–1.2)

## 2017-09-14 NOTE — Telephone Encounter (Signed)
Nunzio Cory from [employer] called to report pt's stat hepatic function panel WNL. Will fax results.

## 2017-09-14 NOTE — Telephone Encounter (Signed)
Results are in patients chart

## 2017-09-17 ENCOUNTER — Encounter: Payer: Self-pay | Admitting: General Practice

## 2017-09-28 ENCOUNTER — Other Ambulatory Visit: Payer: Self-pay

## 2017-09-28 MED ORDER — METFORMIN HCL ER 500 MG PO TB24: ORAL_TABLET | ORAL | 3 refills | Status: DC

## 2017-10-03 ENCOUNTER — Other Ambulatory Visit: Payer: Self-pay | Admitting: General Practice

## 2017-10-03 ENCOUNTER — Other Ambulatory Visit: Payer: Self-pay | Admitting: *Deleted

## 2017-10-03 MED ORDER — FAMOTIDINE 20 MG PO TABS: 20.0000 mg | ORAL_TABLET | Freq: Two times a day (BID) | ORAL | 6 refills | Status: DC

## 2017-10-03 MED ORDER — SITAGLIPTIN PHOSPHATE 100 MG PO TABS: 100.0000 mg | ORAL_TABLET | Freq: Every day | ORAL | 0 refills | Status: DC

## 2017-10-11 DIAGNOSIS — K591 Functional diarrhea: Secondary | ICD-10-CM | POA: Diagnosis not present

## 2017-10-11 NOTE — Telephone Encounter (Signed)
Error

## 2017-10-12 ENCOUNTER — Other Ambulatory Visit: Payer: Self-pay | Admitting: Family Medicine

## 2017-10-19 ENCOUNTER — Ambulatory Visit: Payer: Medicare Other | Admitting: Endocrinology

## 2017-10-26 ENCOUNTER — Other Ambulatory Visit: Payer: Self-pay

## 2017-10-26 MED ORDER — METFORMIN HCL ER 500 MG PO TB24: ORAL_TABLET | ORAL | 3 refills | Status: DC

## 2017-10-30 DIAGNOSIS — H401131 Primary open-angle glaucoma, bilateral, mild stage: Secondary | ICD-10-CM | POA: Diagnosis not present

## 2017-10-30 DIAGNOSIS — E119 Type 2 diabetes mellitus without complications: Secondary | ICD-10-CM | POA: Diagnosis not present

## 2017-10-30 DIAGNOSIS — Z961 Presence of intraocular lens: Secondary | ICD-10-CM | POA: Diagnosis not present

## 2017-10-30 DIAGNOSIS — H524 Presbyopia: Secondary | ICD-10-CM | POA: Diagnosis not present

## 2017-10-30 DIAGNOSIS — H26491 Other secondary cataract, right eye: Secondary | ICD-10-CM | POA: Diagnosis not present

## 2017-11-01 ENCOUNTER — Ambulatory Visit: Payer: Self-pay | Admitting: *Deleted

## 2017-11-01 NOTE — Telephone Encounter (Signed)
Patient is taking Citalopram 40 mg. She is having a hard time with depression. Patient lost her husband recently. Patient is using Melatonin- which seems to help- she does get up early. She is keeping herself busy- she is not resting good.  Patient was wondering if she could double her dose- told patient that she was at a highest dosing and that she may need to add another medication- will check with PCP. Patient declines to make an appointment at this time- she states she was just in the office- told patient her PCP may wish to see her. Contact- [phone]  Reason for Disposition . [1] Started on anti-depressant medications < 2 weeks ago AND [2] not feeling any better  Answer Assessment - Initial Assessment Questions 1. CONCERN: "What happened that made you call today?"     Celexa is helping- but patient feels she wants to hide- she is tearful at times  2. DEPRESSION SYMPTOM SCREENING: "How are you feeling overall?" (e.g., decreased energy, increased sleeping or difficulty sleeping, difficulty concentrating, feelings of sadness, guilt, hopelessness, or worthlessness)     Overwhelmed- she is getting it done 3. RISK OF HARM - SUICIDAL IDEATION:  "Do you ever have thoughts of hurting or killing yourself?"  (e.g., yes, no, no but preoccupation with thoughts about death)   - INTENT:  "Do you have thoughts of hurting or killing yourself right NOW?" (e.g., yes, no, N/A)   - PLAN: "Do you have a specific plan for how you would do this?" (e.g., gun, knife, overdose, no plan, N/A)     Wants to hide 4. RISK OF HARM - HOMICIDAL IDEATION:  "Do you ever have thoughts of hurting or killing someone else?"  (e.g., yes, no, no but preoccupation with thoughts about death)   - INTENT:  "Do you have thoughts of hurting or killing someone right NOW?" (e.g., yes, no, N/A)   - PLAN: "Do you have a specific plan for how you would do this?" (e.g., gun, knife, no plan, N/A)      no 5. FUNCTIONAL IMPAIRMENT: "How have  things been going for you overall in your life? Have you had any more difficulties than usual doing your normal daily activities?"  (e.g., better, same, worse; self-care, school, work, interactions)     Patient is functioning- she volunteers and stays busy - she is having trouble with pass words/ banking/ getting financial things settled- her husband was her best friend 6. SUPPORT: "Who is with you now?" "Who do you live with?" "Do you have family or friends nearby who you can talk to?"       sister, brother - nieces 6. THERAPIST: "Do you have a counselor or therapist? Name?"     Not at this time- but might be open to seeing one at later time 44. STRESSORS: "Has there been any new stress or recent changes in your life?"     Death of husband 53. DRUG ABUSE/ALCOHOL: "Do you drink alcohol or use any illegal drugs?"      Some alcohol 10. OTHER: "Do you have any other health or medical symptoms right now?" (e.g., fever)       Some shakiness inside- hyper 11. PREGNANCY: "Is there any chance you are pregnant?" "When was your last menstrual period?"       n/a  Protocols used: DEPRESSION-A-AH

## 2017-11-01 NOTE — Telephone Encounter (Signed)
FYI

## 2017-11-01 NOTE — Telephone Encounter (Signed)
Pt has not been seen since late March and since I was unaware that her husband had passed (I'm SO sorry!) I would prefer she come in for a visit so we can figure out the best plan going forward

## 2017-11-01 NOTE — Telephone Encounter (Signed)
Called pt and left a detailed message to advise of PCP recommendations.  [place] [facility] to Discuss results / PCP recommendations / Schedule patient.  CRM created.

## 2017-11-09 ENCOUNTER — Other Ambulatory Visit: Payer: Self-pay

## 2017-11-09 ENCOUNTER — Telehealth: Payer: Self-pay | Admitting: Endocrinology

## 2017-11-09 DIAGNOSIS — M542 Cervicalgia: Secondary | ICD-10-CM | POA: Diagnosis not present

## 2017-11-09 DIAGNOSIS — M9905 Segmental and somatic dysfunction of pelvic region: Secondary | ICD-10-CM | POA: Diagnosis not present

## 2017-11-09 DIAGNOSIS — M5126 Other intervertebral disc displacement, lumbar region: Secondary | ICD-10-CM | POA: Diagnosis not present

## 2017-11-09 DIAGNOSIS — M9903 Segmental and somatic dysfunction of lumbar region: Secondary | ICD-10-CM | POA: Diagnosis not present

## 2017-11-09 DIAGNOSIS — M9902 Segmental and somatic dysfunction of thoracic region: Secondary | ICD-10-CM | POA: Diagnosis not present

## 2017-11-09 DIAGNOSIS — M546 Pain in thoracic spine: Secondary | ICD-10-CM | POA: Diagnosis not present

## 2017-11-09 DIAGNOSIS — M9901 Segmental and somatic dysfunction of cervical region: Secondary | ICD-10-CM | POA: Diagnosis not present

## 2017-11-09 DIAGNOSIS — M545 Low back pain: Secondary | ICD-10-CM | POA: Diagnosis not present

## 2017-11-09 MED ORDER — METFORMIN HCL ER 500 MG PO TB24: ORAL_TABLET | ORAL | 3 refills | Status: DC

## 2017-11-09 NOTE — Telephone Encounter (Signed)
metFORMIN (GLUCOPHAGE-XR) 500 MG 24 hr tablet  Patient stated that prescription was sent into the wrong pharmacy and needs this sent to the pharmacy below  [place], [place] - [id number]

## 2017-11-09 NOTE — Telephone Encounter (Signed)
I have sent to correct pharmacy.  

## 2017-11-12 DIAGNOSIS — M9902 Segmental and somatic dysfunction of thoracic region: Secondary | ICD-10-CM | POA: Diagnosis not present

## 2017-11-12 DIAGNOSIS — M9901 Segmental and somatic dysfunction of cervical region: Secondary | ICD-10-CM | POA: Diagnosis not present

## 2017-11-12 DIAGNOSIS — M9903 Segmental and somatic dysfunction of lumbar region: Secondary | ICD-10-CM | POA: Diagnosis not present

## 2017-11-12 DIAGNOSIS — M545 Low back pain: Secondary | ICD-10-CM | POA: Diagnosis not present

## 2017-11-12 DIAGNOSIS — M546 Pain in thoracic spine: Secondary | ICD-10-CM | POA: Diagnosis not present

## 2017-11-12 DIAGNOSIS — M5126 Other intervertebral disc displacement, lumbar region: Secondary | ICD-10-CM | POA: Diagnosis not present

## 2017-11-12 DIAGNOSIS — M9905 Segmental and somatic dysfunction of pelvic region: Secondary | ICD-10-CM | POA: Diagnosis not present

## 2017-11-12 DIAGNOSIS — M542 Cervicalgia: Secondary | ICD-10-CM | POA: Diagnosis not present

## 2017-11-23 ENCOUNTER — Ambulatory Visit (INDEPENDENT_AMBULATORY_CARE_PROVIDER_SITE_OTHER): Payer: Medicare Other | Admitting: Family Medicine

## 2017-11-23 ENCOUNTER — Encounter: Payer: Self-pay | Admitting: Family Medicine

## 2017-11-23 ENCOUNTER — Other Ambulatory Visit: Payer: Self-pay

## 2017-11-23 VITALS — BP 128/84 | HR 78 | Temp 98.1°F | Resp 16 | Ht 63.0 in | Wt 163.2 lb

## 2017-11-23 DIAGNOSIS — R197 Diarrhea, unspecified: Secondary | ICD-10-CM | POA: Diagnosis not present

## 2017-11-23 DIAGNOSIS — L509 Urticaria, unspecified: Secondary | ICD-10-CM

## 2017-11-23 DIAGNOSIS — F418 Other specified anxiety disorders: Secondary | ICD-10-CM

## 2017-11-23 MED ORDER — BUSPIRONE HCL 5 MG PO TABS: 5.0000 mg | ORAL_TABLET | Freq: Two times a day (BID) | ORAL | 3 refills | Status: DC

## 2017-11-23 NOTE — Assessment & Plan Note (Signed)
Deteriorated.  Pt is having high anxiety since her husband died.  Her friends and her son have been a great support for her.  She is not interested in grief counseling at this time- 'i'm not ready'.  Suspect that her anxiety is causing IBS (diarrhea) and her giant hive.  Add Buspar twice daily and monitor closely for improvement.

## 2017-11-23 NOTE — Progress Notes (Signed)
   Subjective:    Patient ID: [name], female    DOB: 1940-10-12, 77 y.o.   MRN: [id number]  HPI Anxiety- pt's husband passed ~2 months ago.  Son has been very supportive.  Daughter has not followed up which is hurtful.  On Citalopram 40mg  daily.  Pt feels her friends are a good support system.  Going to [place] this summer to visit family.  'i'm doing better than I believed I would'.  Pt has restarted yoga.  Pt states she's not ready for grief counseling.  Diarrhea- 'I can't seem to stop it'.  Taking immodium, pepto w/o relief.  sxs have been ongoing 'for a couple of years'.  Pt feels that her peach iced tea is causing the problem.    Rash- pt reports large itchy area over L flank, 'it drives me crazy'.   Review of Systems For ROS see HPI     Objective:   Physical Exam  Constitutional: She is oriented to person, place, and time. She appears well-developed and well-nourished. No distress.  HENT:  Head: Normocephalic and atraumatic.  Abdominal: Soft. Bowel sounds are normal. She exhibits no distension and no mass. There is no tenderness. There is no guarding.  Neurological: She is alert and oriented to person, place, and time.  Skin: Skin is warm and dry. Rash (giant hive over L flank) noted. There is erythema.  Psychiatric:  Appropriately tearful when discussing her husbad Rapid, almost pressured speech during the visit  Vitals reviewed.         Assessment & Plan:  Hives- new.  Suspect this is stress related.  Encouraged continued daily use of Zyrtec.  Pt to add topical Triamcinolone (she has this at home).  Pt expressed understanding and is in agreement w/ plan.   Diarrhea- new to provider, recurrent problem for pt.  Suspect that this is IBS due to her high anxiety levels and stress of losing her husband.  Reviewed dietary and lifestyle modifications that can help w/ sxs.  She is interested in again seeing Dr Ardis Hughs.  Referral placed.

## 2017-11-23 NOTE — Patient Instructions (Signed)
Follow up in 1 month to recheck anxiety We'll call you with your GI appt for the diarrhea Drink plenty of fluids START the Buspar twice daily Apply the Triamcinolone twice daily to the itchy area (hive) Call with any questions or concerns Hang in there!  You're doing great!!!

## 2017-11-26 DIAGNOSIS — Z1231 Encounter for screening mammogram for malignant neoplasm of breast: Secondary | ICD-10-CM | POA: Diagnosis not present

## 2017-11-26 DIAGNOSIS — M8589 Other specified disorders of bone density and structure, multiple sites: Secondary | ICD-10-CM | POA: Diagnosis not present

## 2017-11-26 DIAGNOSIS — Z780 Asymptomatic menopausal state: Secondary | ICD-10-CM | POA: Diagnosis not present

## 2017-11-26 LAB — HM DEXA SCAN

## 2017-11-27 ENCOUNTER — Other Ambulatory Visit: Payer: Self-pay | Admitting: Family Medicine

## 2017-11-27 LAB — HM MAMMOGRAPHY

## 2017-11-27 NOTE — Telephone Encounter (Signed)
Last OV 11/23/17, Next OV 12/26/17  Last filled 10/12/17, # 60 with 0 refills  Do you want to keep the same instructions?  Take 1 tablet (250 mg total) by mouth 2 (two) times daily with a meal. As needed.

## 2017-11-28 ENCOUNTER — Encounter: Payer: Self-pay | Admitting: General Practice

## 2017-11-29 ENCOUNTER — Encounter: Payer: Self-pay | Admitting: Family Medicine

## 2017-11-29 DIAGNOSIS — R928 Other abnormal and inconclusive findings on diagnostic imaging of breast: Secondary | ICD-10-CM | POA: Diagnosis not present

## 2017-12-03 ENCOUNTER — Other Ambulatory Visit: Payer: Self-pay | Admitting: Family Medicine

## 2017-12-05 ENCOUNTER — Encounter: Payer: Self-pay | Admitting: Nurse Practitioner

## 2017-12-07 ENCOUNTER — Encounter: Payer: Self-pay | Admitting: General Practice

## 2017-12-11 DIAGNOSIS — F418 Other specified anxiety disorders: Secondary | ICD-10-CM | POA: Diagnosis not present

## 2017-12-11 DIAGNOSIS — K2900 Acute gastritis without bleeding: Secondary | ICD-10-CM | POA: Diagnosis not present

## 2017-12-11 DIAGNOSIS — K591 Functional diarrhea: Secondary | ICD-10-CM | POA: Diagnosis not present

## 2017-12-13 ENCOUNTER — Ambulatory Visit (INDEPENDENT_AMBULATORY_CARE_PROVIDER_SITE_OTHER): Payer: Medicare Other | Admitting: Endocrinology

## 2017-12-13 ENCOUNTER — Encounter: Payer: Self-pay | Admitting: Endocrinology

## 2017-12-13 VITALS — BP 124/64 | HR 86 | Ht 63.0 in | Wt 162.0 lb

## 2017-12-13 DIAGNOSIS — E119 Type 2 diabetes mellitus without complications: Secondary | ICD-10-CM

## 2017-12-13 LAB — POCT GLYCOSYLATED HEMOGLOBIN (HGB A1C): HEMOGLOBIN A1C: 6.1 % — AB (ref 4.0–5.6)

## 2017-12-13 NOTE — Patient Instructions (Signed)

## 2017-12-13 NOTE — Progress Notes (Signed)
Subjective:    Patient ID: [name], female    DOB: May 14, 1941, 77 y.o.   MRN: [id number]  HPI Pt returns for f/u of diabetes mellitus: DM type: 2 Dx'ed: 0626 Complications: none Therapy: 2 oral meds. GDM: never.   DKA: never Severe hypoglycemia: never Pancreatitis: never Other: she has never been on insulin Interval history: pt states she feels well in general.  She takes meds as rx'ed.   Past Medical History:  Diagnosis Date  . Allergic rhinitis   . Asthma   . Diabetes mellitus    just changed from injection to metformin ? month  . Dysrhythmia    palpitations  . Glaucoma   . History of kidney stones   . Hyperlipemia   . Migraines   . Palpitations   . Pneumonia    hx  . Urinary tract infection     Past Surgical History:  Procedure Laterality Date  . ABDOMINAL HYSTERECTOMY     Has ovaries  . BACK SURGERY  1987   lumb  . BREAST SURGERY Left 07/2016   bx- needle in breast  . CHOLECYSTECTOMY  09/26/13  . CLOSED REDUCTION NASAL FRACTURE  04/29/2012   Procedure: CLOSED REDUCTION NASAL FRACTURE;  Surgeon: Jodi Marble, MD;  Location: [facility];  Service: ENT;  Laterality: N/A;  WITH STABILIZATION  . COLONOSCOPY    . Deviated septum repair    . EYE SURGERY Bilateral    cataracts  . LITHOTRIPSY    . REVERSE SHOULDER ARTHROPLASTY Right 09/15/2016   Procedure: RIGHT REVERSE SHOULDER ARTHROPLASTY;  Surgeon: Netta Cedars, MD;  Location: [facility];  Service: Orthopedics;  Laterality: Right;  . ROTATOR CUFF REPAIR     right  . TONSILLECTOMY      Social History   Socioeconomic History  . Marital status: Married    Spouse name: Not on file  . Number of children: 2  . Years of education: Not on file  . Highest education level: Not on file  Occupational History  . Occupation: Retired -Sports coach: RETIRED  Social Needs  . Financial resource strain: Not on file  . Food insecurity:    Worry: Not on file    Inability: Not on file  .  Transportation needs:    Medical: Not on file    Non-medical: Not on file  Tobacco Use  . Smoking status: Former Smoker    Packs/day: 0.10    Years: 4.00    Pack years: 0.40    Types: Cigarettes    Last attempt to quit: 06/19/1986    Years since quitting: 31.5  . Smokeless tobacco: Never Used  . Tobacco comment: smoked in college years ago  Substance and Sexual Activity  . Alcohol use: Yes    Comment: 1-2 glasses week variety beer occ scotch  . Drug use: No  . Sexual activity: Not on file  Lifestyle  . Physical activity:    Days per week: Not on file    Minutes per session: Not on file  . Stress: Not on file  Relationships  . Social connections:    Talks on phone: Not on file    Gets together: Not on file    Attends religious service: Not on file    Active member of club or organization: Not on file    Attends meetings of clubs or organizations: Not on file    Relationship status: Not on file  . Intimate partner violence:  Fear of current or ex partner: Not on file    Emotionally abused: Not on file    Physically abused: Not on file    Forced sexual activity: Not on file  Other Topics Concern  . Not on file  Social History Narrative   Daily caffeine     Current Outpatient Medications on File Prior to Visit  Medication Sig Dispense Refill  . albuterol (PROAIR HFA) 108 (90 Base) MCG/ACT inhaler Inhale 2 puffs into the lungs every 4 (four) hours as needed for wheezing or shortness of breath. 1 Inhaler 6  . atorvastatin (LIPITOR) 20 MG tablet TAKE ONE (1) TABLET EACH DAY 90 tablet 1  . bifidobacterium infantis (ALIGN) capsule Take by mouth.    . Biotin 5000 MCG CAPS Take 1 capsule by mouth daily.    . busPIRone (BUSPAR) 5 MG tablet Take 1 tablet (5 mg total) by mouth 2 (two) times daily. 60 tablet 3  . cetirizine (ZYRTEC) 10 MG tablet Take by mouth.    . Chlorpheniramine Maleate (ALLERGY PO) Take 1 tablet by mouth daily.    . citalopram (CELEXA) 40 MG tablet TAKE ONE  (1) TABLET EACH DAY 30 tablet 5  . famotidine (PEPCID) 20 MG tablet Take 1 tablet (20 mg total) by mouth 2 (two) times daily. 60 tablet 6  . fenofibrate (TRICOR) 145 MG tablet TAKE ONE (1) TABLET BY MOUTH EACH DAY AFTER BREAKFAST 30 tablet 6  . fexofenadine (ALLEGRA) 180 MG tablet Take 180 mg by mouth daily.    . fluticasone (FLONASE) 50 MCG/ACT nasal spray Place 1 spray into both nostrils daily. 16 g 2  . Fluticasone-Salmeterol (ADVAIR DISKUS) 250-50 MCG/DOSE AEPB Inhale into the lungs.    . hydrOXYzine (ATARAX/VISTARIL) 25 MG tablet Take 1 tablet (25 mg total) by mouth 3 (three) times daily as needed. (Patient not taking: Reported on 11/23/2017) 30 tablet 0  . LUMIGAN 0.01 % SOLN Place 1 drop into both eyes at bedtime.     . Melatonin 10 MG CAPS Take by mouth.    . metFORMIN (GLUCOPHAGE-XR) 500 MG 24 hr tablet TAKE 4 TABLETS BY MOUTH EVERY MORNING WITH BREAKFAST 360 tablet 3  . montelukast (SINGULAIR) 10 MG tablet TAKE ONE (1) TABLET EACH DAY 30 tablet 5  . naproxen (NAPROSYN) 250 MG tablet TAKE ONE (1) TABLET BY MOUTH TWO (2) TIMES DAILY WITH A MEAL AS NEEDED 60 tablet 0  . ONE TOUCH ULTRA TEST test strip USE 1 TIME A DAY 50 each 12  . ONETOUCH DELICA LANCETS 76H MISC Use one lancet to test sugars once daily. E11.9 100 each 12  . OVER THE COUNTER MEDICATION allertec    . sitaGLIPtin (JANUVIA) 100 MG tablet Take 1 tablet (100 mg total) by mouth daily. 90 tablet 0  . triamcinolone ointment (KENALOG) 0.1 % Apply 1 application topically 2 (two) times daily. 90 g 1   No current facility-administered medications on file prior to visit.     Allergies  Allergen Reactions  . Papain Anaphylaxis    Takes an extreme concentration  . Papaya Derivatives Anaphylaxis  . Mushroom Ext Cmplx(Shiitake-Reishi-Mait) Other (See Comments)    Head congestion and sore throat  . Mushroom Extract Complex Other (See Comments)    Head congestion and sore throat  . Venlafaxine Other (See Comments)    UNSPECIFIED  REACTION  Pt states she can take Name brand. Lethargy    Family History  Problem Relation Age of Onset  . Diabetes Father   .  Colon cancer Neg Hx   . Esophageal cancer Neg Hx   . Rectal cancer Neg Hx   . Stomach cancer Neg Hx   . Breast cancer Neg Hx     BP 124/64 (BP Location: Left Arm, Patient Position: Sitting, Cuff Size: Normal)   Pulse 86   Ht 5\' 3"  (1.6 m)   Wt 162 lb (73.5 kg)   SpO2 96%   BMI 28.70 kg/m    Review of Systems She denies hypoglycemia.  She has lost a few lbs.      Objective:   Physical Exam VITAL SIGNS:  See vs page GENERAL: no distress Pulses: foot pulses are intact bilaterally.   MSK: no deformity of the feet or ankles.  CV: trace bilat edema of the legs Skin:  no ulcer on the feet or ankles.  normal color and temp on the feet and ankles Neuro: sensation is intact to touch on the feet and ankles.    Lab Results  Component Value Date   HGBA1C 6.1 (A) 12/13/2017       Assessment & Plan:  Type 2 DM: well-controlled HTN: better off rx.  None is needed now.   Recheck next time.   Patient Instructions  check your blood sugar once a day.  vary the time of day when you check, between before the 3 meals, and at bedtime.  also check if you have symptoms of your blood sugar being too high or too low.  please keep a record of the readings and bring it to your next appointment here (or you can bring the meter itself).  You can write it on any piece of paper.  please call us sooner if your blood sugar goes below 70, or if you have a lot of readings over 200.  Please continue the same medications  Please come back for a follow-up appointment in 4-6 months.

## 2017-12-18 ENCOUNTER — Ambulatory Visit: Payer: Medicare Other | Admitting: Nurse Practitioner

## 2017-12-18 ENCOUNTER — Telehealth: Payer: Self-pay | Admitting: Gastroenterology

## 2017-12-18 ENCOUNTER — Encounter

## 2017-12-18 NOTE — Telephone Encounter (Signed)
Pt has had diarrhea for 4 months, she has seen her PCP and they recommend GI. Her appt had to be rescheduled today due to schedule change.  I have rescheduled the appt to 7/11 with Alonza Bogus, she will go to the ED if she feels like she can not wait until that appt.

## 2017-12-25 ENCOUNTER — Ambulatory Visit: Payer: Medicare Other | Admitting: Family Medicine

## 2017-12-26 ENCOUNTER — Other Ambulatory Visit: Payer: Self-pay

## 2017-12-26 ENCOUNTER — Encounter: Payer: Self-pay | Admitting: Family Medicine

## 2017-12-26 ENCOUNTER — Ambulatory Visit (INDEPENDENT_AMBULATORY_CARE_PROVIDER_SITE_OTHER): Payer: Medicare Other | Admitting: Family Medicine

## 2017-12-26 VITALS — BP 120/72 | HR 70 | Temp 99.0°F | Resp 16 | Ht 63.0 in | Wt 160.6 lb

## 2017-12-26 DIAGNOSIS — F418 Other specified anxiety disorders: Secondary | ICD-10-CM

## 2017-12-26 DIAGNOSIS — K529 Noninfective gastroenteritis and colitis, unspecified: Secondary | ICD-10-CM | POA: Diagnosis not present

## 2017-12-26 LAB — HEPATIC FUNCTION PANEL
ALBUMIN: 4.2 g/dL (ref 3.5–5.2)
ALK PHOS: 48 U/L (ref 39–117)
ALT: 39 U/L — ABNORMAL HIGH (ref 0–35)
AST: 47 U/L — AB (ref 0–37)
BILIRUBIN DIRECT: 0.1 mg/dL (ref 0.0–0.3)
BILIRUBIN TOTAL: 0.3 mg/dL (ref 0.2–1.2)
TOTAL PROTEIN: 6.4 g/dL (ref 6.0–8.3)

## 2017-12-26 LAB — CBC WITH DIFFERENTIAL/PLATELET
BASOS ABS: 0.1 10*3/uL (ref 0.0–0.1)
Basophils Relative: 0.8 % (ref 0.0–3.0)
EOS PCT: 6.7 % — AB (ref 0.0–5.0)
Eosinophils Absolute: 0.4 10*3/uL (ref 0.0–0.7)
HEMATOCRIT: 36.6 % (ref 36.0–46.0)
HEMOGLOBIN: 12.3 g/dL (ref 12.0–15.0)
LYMPHS PCT: 23.4 % (ref 12.0–46.0)
Lymphs Abs: 1.6 10*3/uL (ref 0.7–4.0)
MCHC: 33.7 g/dL (ref 30.0–36.0)
MCV: 86.7 fl (ref 78.0–100.0)
MONOS PCT: 6.7 % (ref 3.0–12.0)
Monocytes Absolute: 0.4 10*3/uL (ref 0.1–1.0)
Neutro Abs: 4.1 10*3/uL (ref 1.4–7.7)
Neutrophils Relative %: 62.4 % (ref 43.0–77.0)
Platelets: 277 10*3/uL (ref 150.0–400.0)
RBC: 4.22 Mil/uL (ref 3.87–5.11)
RDW: 13.8 % (ref 11.5–15.5)
WBC: 6.6 10*3/uL (ref 4.0–10.5)

## 2017-12-26 LAB — BASIC METABOLIC PANEL
BUN: 16 mg/dL (ref 6–23)
CALCIUM: 9.6 mg/dL (ref 8.4–10.5)
CO2: 26 mEq/L (ref 19–32)
Chloride: 107 mEq/L (ref 96–112)
Creatinine, Ser: 0.79 mg/dL (ref 0.40–1.20)
GFR: 74.77 mL/min (ref >60.00)
GLUCOSE: 133 mg/dL — AB (ref 70–99)
POTASSIUM: 4.1 meq/L (ref 3.5–5.1)
SODIUM: 139 meq/L (ref 135–145)

## 2017-12-26 LAB — LIPASE: Lipase: 25 U/L (ref 11.0–59.0)

## 2017-12-26 LAB — AMYLASE: Amylase: 34 U/L (ref 27–131)

## 2017-12-26 NOTE — Assessment & Plan Note (Addendum)
Pt reports watery diarrhea x4 months.  At times unable to control it.  Only mild improvement w/ Lomotil.  Biggest improvement results from dietary changes.  Pt is very frustrated by this situation- has GI appt tomorrow.  Given her ongoing diarrhea, will order stool studies, CBC, CMP, celiac panel in hopes that some of the results will be available prior to GI appt tomorrow.  Pt expressed understanding and is in agreement w/ plan.

## 2017-12-26 NOTE — Progress Notes (Signed)
   Subjective:    Patient ID: [name], female    DOB: [birth year], 77 y.o.   MRN: [id number]  HPI Anxiety- pt was started on Buspar last visit in addition to Celexa.  Diarrhea- 'I think it's gotten worse'.  Pt is eating mashed potatoes, rice, and oatmeal.  Has GI appt tomorrow.  'it's especially bad in the AM'.  Pt was seen on 7/2 and started on Lomotil w/ some help.  Is avoiding dairy, sugars, artificial sweeteners.  Pt reports 'everything is water except for my pills that come out whole'.  Causing fatigue.  Pt is flying to [place] on 7/20 to see family.  Was able to eat baked trout and creamed potatoes last night w/o difficulty.  'corn was a big mistake'.  Pain is easing but the urgency is worsening.  Pt has not had normal BM in 4-5 months.  No fevers.  Pt has had 3 BMs already today, reports she is averaging 6/day.  Able to eat cantaloupe, watermelon, and bananas.  Able to eat seafood and pasta.  Unable to eat vegetables.  Eating yogurt every other day.   Review of Systems For ROS see HPI     Objective:   Physical Exam  Constitutional: She is oriented to person, place, and time. She appears well-developed and well-nourished. No distress.  HENT:  Head: Normocephalic and atraumatic.  Abdominal: Soft. Bowel sounds are normal. She exhibits no distension and no mass. There is no tenderness. There is no rebound and no guarding.  Neurological: She is alert and oriented to person, place, and time.  Skin: Skin is warm and dry.  Psychiatric: She has a normal mood and affect. Her behavior is normal. Thought content normal.  Vitals reviewed.         Assessment & Plan:

## 2017-12-26 NOTE — Patient Instructions (Addendum)
Follow up as scheduled in August, sooner if needed [facility] notify you of your lab results and make any changes if needed Follow up w/ GI as scheduled tomorrow Continue to drink plenty of fluids Continue to limit dairy Call with any questions or concerns Hang in there!!

## 2017-12-26 NOTE — Assessment & Plan Note (Signed)
Stable since adding Buspar.  No med changes at this time.

## 2017-12-27 ENCOUNTER — Other Ambulatory Visit: Payer: Medicare Other

## 2017-12-27 ENCOUNTER — Encounter: Payer: Self-pay | Admitting: Gastroenterology

## 2017-12-27 ENCOUNTER — Ambulatory Visit (INDEPENDENT_AMBULATORY_CARE_PROVIDER_SITE_OTHER): Payer: Medicare Other | Admitting: Gastroenterology

## 2017-12-27 VITALS — BP 122/74 | HR 81 | Ht 63.0 in | Wt 161.0 lb

## 2017-12-27 DIAGNOSIS — K52839 Microscopic colitis, unspecified: Secondary | ICD-10-CM

## 2017-12-27 DIAGNOSIS — K529 Noninfective gastroenteritis and colitis, unspecified: Secondary | ICD-10-CM

## 2017-12-27 DIAGNOSIS — R197 Diarrhea, unspecified: Secondary | ICD-10-CM | POA: Diagnosis not present

## 2017-12-27 MED ORDER — BUDESONIDE ER 9 MG PO TB24: 9.0000 mg | ORAL_TABLET | Freq: Every day | ORAL | 2 refills | Status: DC

## 2017-12-27 NOTE — Patient Instructions (Signed)
We have sent the following medications to your pharmacy for you to pick up at your convenience: Uceris 9 mg daily   Please call the office with an update of your symptoms in the next one week and ask for Koren Shiver, Therapist, sports, BSN.   Dr. Philemon Kingdom Internal medicine - endocrinology, diabetes & metabolism [address] [place], [place], [place] [address] [phone]

## 2017-12-27 NOTE — Progress Notes (Signed)
I agree with the above note, plan 

## 2017-12-27 NOTE — Addendum Note (Signed)
Addended by: Glenetta Borg on: 12/27/2017 02:11 PM   Modules accepted: Orders

## 2017-12-27 NOTE — Progress Notes (Signed)
12/27/2017 LISETTE MANCEBO [id number] [birth year]   HISTORY OF PRESENT ILLNESS:  This is a pleasant 77 year old female who is a patient of Dr. Ardis Hughs.  Has comes here today with complaints of diarrhea x 4 months.  Has several things that could be contributing including Metformin, no gallbladder, IBS with recent loss of her husband, but also has history of lymphocytic colitis in 2013.  Was having diarrhea at that time and this was seen on random biopsies from colonoscopy.  Was treated with budesonide.  So far lomotil, imodium, and questran have not helped.  She turned in stool studies this morning that were ordered by her PCP so will await those results as well.   Past Medical History:  Diagnosis Date  . Allergic rhinitis   . Asthma   . Diabetes mellitus    just changed from injection to metformin ? month  . Dysrhythmia    palpitations  . Glaucoma   . History of kidney stones   . Hyperlipemia   . Migraines   . Palpitations   . Pneumonia    hx  . Urinary tract infection    Past Surgical History:  Procedure Laterality Date  . ABDOMINAL HYSTERECTOMY     Has ovaries  . BACK SURGERY  1987   lumb  . BREAST SURGERY Left 07/2016   bx- needle in breast  . CHOLECYSTECTOMY  09/26/13  . CLOSED REDUCTION NASAL FRACTURE  04/29/2012   Procedure: CLOSED REDUCTION NASAL FRACTURE;  Surgeon: Jodi Marble, MD;  Location: [facility];  Service: ENT;  Laterality: N/A;  WITH STABILIZATION  . COLONOSCOPY    . Deviated septum repair    . EYE SURGERY Bilateral    cataracts  . LITHOTRIPSY    . REVERSE SHOULDER ARTHROPLASTY Right 09/15/2016   Procedure: RIGHT REVERSE SHOULDER ARTHROPLASTY;  Surgeon: Netta Cedars, MD;  Location: [facility];  Service: Orthopedics;  Laterality: Right;  . ROTATOR CUFF REPAIR     right  . TONSILLECTOMY      reports that she quit smoking about 31 years ago. Her smoking use included cigarettes. She has a 0.40 pack-year smoking history. She has never used  smokeless tobacco. She reports that she drinks alcohol. She reports that she does not use drugs. family history includes Diabetes in her father. Allergies  Allergen Reactions  . Papain Anaphylaxis    Takes an extreme concentration  . Papaya Derivatives Anaphylaxis  . Mushroom Ext Cmplx(Shiitake-Reishi-Mait) Other (See Comments)    Head congestion and sore throat  . Mushroom Extract Complex Other (See Comments)    Head congestion and sore throat  . Venlafaxine Other (See Comments)    UNSPECIFIED REACTION  Pt states she can take Name brand. Lethargy      Outpatient Encounter Medications as of 12/27/2017  Medication Sig  . albuterol (PROAIR HFA) 108 (90 Base) MCG/ACT inhaler Inhale 2 puffs into the lungs every 4 (four) hours as needed for wheezing or shortness of breath.  Marland Kitchen atorvastatin (LIPITOR) 20 MG tablet TAKE ONE (1) TABLET EACH DAY  . bifidobacterium infantis (ALIGN) capsule Take by mouth.  . Biotin 5000 MCG CAPS Take 1 capsule by mouth daily.  . busPIRone (BUSPAR) 5 MG tablet Take 1 tablet (5 mg total) by mouth 2 (two) times daily.  . cetirizine (ZYRTEC) 10 MG tablet Take by mouth.  . Chlorpheniramine Maleate (ALLERGY PO) Take 1 tablet by mouth daily.  . cholestyramine (QUESTRAN) 4 g packet Take by mouth.  Marland Kitchen  citalopram (CELEXA) 40 MG tablet TAKE ONE (1) TABLET EACH DAY  . diphenoxylate-atropine (LOMOTIL) 2.5-0.025 MG tablet Take by mouth.  . famotidine (PEPCID) 20 MG tablet Take 1 tablet (20 mg total) by mouth 2 (two) times daily.  . fenofibrate (TRICOR) 145 MG tablet TAKE ONE (1) TABLET BY MOUTH EACH DAY AFTER BREAKFAST  . fexofenadine (ALLEGRA) 180 MG tablet Take 180 mg by mouth daily.  . fluticasone (FLONASE) 50 MCG/ACT nasal spray Place 1 spray into both nostrils daily.  . Fluticasone-Salmeterol (ADVAIR DISKUS) 250-50 MCG/DOSE AEPB Inhale into the lungs.  . hydrOXYzine (ATARAX/VISTARIL) 25 MG tablet Take 1 tablet (25 mg total) by mouth 3 (three) times daily as needed.  (Patient not taking: Reported on 11/23/2017)  . LORazepam (ATIVAN) 0.5 MG tablet 1/2 to 1 tablet tid prn anxiety, stress, diarrhea. Utilize sparingly please  . LUMIGAN 0.01 % SOLN Place 1 drop into both eyes at bedtime.   . Melatonin 10 MG CAPS Take by mouth.  . metFORMIN (GLUCOPHAGE-XR) 500 MG 24 hr tablet TAKE 4 TABLETS BY MOUTH EVERY MORNING WITH BREAKFAST  . montelukast (SINGULAIR) 10 MG tablet TAKE ONE (1) TABLET EACH DAY  . naproxen (NAPROSYN) 250 MG tablet TAKE ONE (1) TABLET BY MOUTH TWO (2) TIMES DAILY WITH A MEAL AS NEEDED  . ONE TOUCH ULTRA TEST test strip USE 1 TIME A DAY  . ONETOUCH DELICA LANCETS 64Q MISC Use one lancet to test sugars once daily. E11.9  . OVER THE COUNTER MEDICATION allertec  . sitaGLIPtin (JANUVIA) 100 MG tablet Take 1 tablet (100 mg total) by mouth daily.  Marland Kitchen triamcinolone ointment (KENALOG) 0.1 % Apply 1 application topically 2 (two) times daily.   No facility-administered encounter medications on file as of 12/27/2017.      REVIEW OF SYSTEMS  : All other systems reviewed and negative except where noted in the History of Present Illness.   PHYSICAL EXAM: BP 122/74   Pulse 81   Ht 5\' 3"  (1.6 m)   Wt 161 lb (73 kg)   SpO2 95%   BMI 28.52 kg/m  General: Well developed white female in no acute distress Head: Normocephalic and atraumatic Eyes:  Sclerae anicteric, conjunctiva pink. Ears: Normal auditory acuity Lungs: Clear throughout to auscultation; no increased WOB. Heart: Regular rate and rhythm; no M/R/G. Abdomen: Soft, non-distended.  BS present, somewhat hyperactive.  Non-tender. Musculoskeletal: Symmetrical with no gross deformities  Skin: No lesions on visible extremities Extremities: No edema  Neurological: Alert oriented x 4, grossly non-focal Psychological:  Alert and cooperative. Normal mood and affect  ASSESSMENT AND PLAN: *Diarrhea x 4 months:  Has several things that could be contributing including Metformin, no gallbladder, IBS with  recent loss of her husband, but also has history of lymphocytic colitis in 2013.  So far lomotil, imodium, and questran have not helped.  She turned in stool studies this morning so will await those results, but I am going to start her on Uceris 9 mg daily as well.  She will call back in one week with an update on her symptoms.   CC:  Midge Minium, MD

## 2017-12-28 ENCOUNTER — Other Ambulatory Visit: Payer: Self-pay | Admitting: General Practice

## 2017-12-28 ENCOUNTER — Other Ambulatory Visit: Payer: Self-pay | Admitting: Emergency Medicine

## 2017-12-28 ENCOUNTER — Telehealth: Payer: Self-pay | Admitting: Gastroenterology

## 2017-12-28 MED ORDER — METRONIDAZOLE 500 MG PO TABS: 500.0000 mg | ORAL_TABLET | Freq: Three times a day (TID) | ORAL | 0 refills | Status: DC

## 2017-12-28 MED ORDER — BUDESONIDE 3 MG PO CPEP: 9.0000 mg | ORAL_CAPSULE | Freq: Every day | ORAL | 0 refills | Status: DC

## 2017-12-28 MED ORDER — VANCOMYCIN HCL 125 MG PO CAPS: 125.0000 mg | ORAL_CAPSULE | Freq: Four times a day (QID) | ORAL | 0 refills | Status: AC

## 2017-12-28 NOTE — Telephone Encounter (Signed)
Per Alonza Bogus, PA-C looks like she has had entocort in the past and that worked. Please send in Entocort 3 mg three tablets once a day. Rx sent to pharmacy. Looks like it is on formulary. Patient informed.

## 2017-12-28 NOTE — Telephone Encounter (Signed)
Please advise an alternative

## 2017-12-29 ENCOUNTER — Telehealth: Payer: Self-pay | Admitting: Family Medicine

## 2017-12-29 NOTE — Telephone Encounter (Signed)
TH called ---  + c diff Flagyl already started yesterday Attempted to call pt to see how she was--- message left

## 2017-12-30 LAB — RETICULIN ANTIBODIES, IGA W TITER: Reticulin IGA Screen: NEGATIVE

## 2017-12-30 LAB — GLIADIN ANTIBODIES, SERUM
Gliadin IgA: 4 Units
Gliadin IgG: 2 Units

## 2017-12-30 LAB — TISSUE TRANSGLUTAMINASE, IGA: (tTG) Ab, IgA: 1 U/mL

## 2017-12-31 ENCOUNTER — Telehealth: Payer: Self-pay | Admitting: General Practice

## 2017-12-31 DIAGNOSIS — E119 Type 2 diabetes mellitus without complications: Secondary | ICD-10-CM

## 2017-12-31 LAB — C. DIFFICILE GDH AND TOXIN A/B
GDH ANTIGEN: DETECTED
MICRO NUMBER:: 90822886
SPECIMEN QUALITY:: ADEQUATE
TOXIN A AND B: NOT DETECTED

## 2017-12-31 LAB — STOOL CULTURE
MICRO NUMBER: 90822820
MICRO NUMBER:: 90822818
MICRO NUMBER:: 90822819
SHIGA RESULT:: NOT DETECTED
SPECIMEN QUALITY: ADEQUATE
SPECIMEN QUALITY:: ADEQUATE
SPECIMEN QUALITY:: ADEQUATE

## 2017-12-31 LAB — CLOSTRIDIUM DIFFICILE TOXIN B, QUALITATIVE, REAL-TIME PCR: Toxigenic C. Difficile by PCR: DETECTED — AB

## 2017-12-31 NOTE — Telephone Encounter (Signed)
Called pt and tried to advise of the Endo names. Either Dr. Posey Pronto or PA Autumn Hudnall.   Pt wanted me to email this to her personal email. It did not go through will try calling again later.

## 2017-12-31 NOTE — Telephone Encounter (Signed)
Please advise, pt is currently seeing Dr. Loanne Drilling.    Copied from [place] [phone]. Topic: General - Other >> Dec 31, 2017  9:01 AM Alfredia Ferguson R wrote: Pt is calling wanting a suggestion of a new diabetes doctor. She states she is unhappy with the diabetic doctor she is currently with.  Callback # [phone]

## 2017-12-31 NOTE — Telephone Encounter (Signed)
Given her location, ok to refer to [facility]

## 2018-01-01 NOTE — Telephone Encounter (Signed)
Pt called office and told [facility] that she would like to see Dr. Posey Pronto. Referral placed.

## 2018-01-01 NOTE — Addendum Note (Signed)
Addended by: Desmond Dike L on: 01/01/2018 10:30 AM   Modules accepted: Orders

## 2018-01-02 ENCOUNTER — Ambulatory Visit: Payer: Self-pay | Admitting: Family Medicine

## 2018-01-02 ENCOUNTER — Ambulatory Visit: Payer: Medicare Other | Admitting: Family Medicine

## 2018-01-02 NOTE — Telephone Encounter (Signed)
Per Dr. Birdie Riddle,  Patient does have CDiff and if she needs to be seen then she needs to call her GI doctor as they have changed her ABX.      Patient is aware that she does not need to keep the appt today.  She states that she is going to continue the abx and she will call GI if needed.

## 2018-01-02 NOTE — Telephone Encounter (Signed)
Called in c/o having a return of diarrhea.   She has seen a GI specialist but is leaving town Saturday to go to [place] and is requesting to see [name].    The stools are watery and she is having abd cramping.  I scheduled her with [name] for today 01/02/18 at 3:00.  See triage notes.  Reason for Disposition . [1] Recent antibiotic therapy (i.e., within last 2 months) AND [2] diarrhea present > 3 days since antibiotic was stopped  Answer Assessment - Initial Assessment Questions 1. DIARRHEA SEVERITY: "How bad is the diarrhea?" "How many extra stools have you had in the past 24 hours than normal?"    - NO DIARRHEA (SCALE 0)   - MILD (SCALE 1-3): Few loose or mushy BMs; increase of 1-3 stools over normal daily number of stools; mild increase in ostomy output.   -  MODERATE (SCALE 4-7): Increase of 4-6 stools daily over normal; moderate increase in ostomy output. * SEVERE (SCALE 8-10; OR 'WORST POSSIBLE'): Increase of 7 or more stools daily over normal; moderate increase in ostomy output; incontinence.     My husband passed away a few months ago.   Everyone told me it was nerves.   [name] gave me a medication for the diarrhea and it helped.   The PA from [facility] office prescribed something else.    I was told I had a contagious.    I'm having loose BMs again.   I'm supposed to go on a trip with my children to [place] on Saturday and I'm getting worse.    I saw the GI specialist last Thursday.    2. SET: "When did the diarrhea begin?"      This morning having watery bowels and cramps in abd again. 3. BM CONSISTENCY: "How loose or watery is the diarrhea?"      Loose, watery stools. 4. VOMITING: "Are you also vomiting?" If so, ask: "How many times in the past 24 hours?"      No 5. ABDOMINAL PAIN: "Are you having any abdominal pain?" If yes: "What does it feel like?" (e.g., crampy, dull, intermittent, constant)      Yes cramps. 6. ABDOMINAL PAIN SEVERITY: If present, ask: "How bad is the  pain?"  (e.g., Scale 1-10; mild, moderate, or severe)   - MILD (1-3): doesn't interfere with normal activities, abdomen soft and not tender to touch    - MODERATE (4-7): interferes with normal activities or awakens from sleep, tender to touch    - SEVERE (8-10): excruciating pain, doubled over, unable to do any normal activities       *No Answer* 7. ORAL INTAKE: If vomiting, "Have you been able to drink liquids?" "How much fluids have you had in the past 24 hours?"     *No Answer* 8. HYDRATION: "Any signs of dehydration?" (e.g., dry mouth [not just dry lips], too weak to stand, dizziness, new weight loss) "When did you last urinate?"     I'm drinking a lot of fluids.   I'm drinking lemon lime seltzer water 9. EXPOSURE: "Have you traveled to a foreign country recently?" "Have you been exposed to anyone with diarrhea?" "Could you have eaten any food that was spoiled?"     *No Answer* 10. ANTIBIOTIC USE: "Are you taking antibiotics now or have you taken antibiotics in the past 2 months?"       No. 11. OTHER SYMPTOMS: "Do you have any other symptoms?" (e.g., fever, blood in stool)       *  No Answer* 12. PREGNANCY: "Is there any chance you are pregnant?" "When was your last menstrual period?"       *No Answer*  Protocols used: DIARRHEA-A-AH

## 2018-01-18 DIAGNOSIS — Z7984 Long term (current) use of oral hypoglycemic drugs: Secondary | ICD-10-CM | POA: Diagnosis not present

## 2018-01-18 DIAGNOSIS — E119 Type 2 diabetes mellitus without complications: Secondary | ICD-10-CM | POA: Diagnosis not present

## 2018-01-18 DIAGNOSIS — E785 Hyperlipidemia, unspecified: Secondary | ICD-10-CM | POA: Diagnosis not present

## 2018-01-31 ENCOUNTER — Telehealth: Payer: Self-pay | Admitting: Gastroenterology

## 2018-01-31 ENCOUNTER — Other Ambulatory Visit: Payer: Self-pay | Admitting: General Practice

## 2018-01-31 ENCOUNTER — Telehealth: Payer: Self-pay | Admitting: Family Medicine

## 2018-01-31 DIAGNOSIS — R197 Diarrhea, unspecified: Secondary | ICD-10-CM

## 2018-01-31 DIAGNOSIS — Z23 Encounter for immunization: Secondary | ICD-10-CM

## 2018-01-31 MED ORDER — ZOSTER VAC RECOMB ADJUVANTED 50 MCG/0.5ML IM SUSR: 0.5000 mL | Freq: Once | INTRAMUSCULAR | 1 refills | Status: AC

## 2018-01-31 NOTE — Telephone Encounter (Signed)
The pt has began to have loose stools again for the past 3 days 1-2 stools daily but seems to be getting looser everyday.  She finished abx for C diff the end of July.  She is concerned and would like to know if she needs another round of C Diff.  Would you like to get stool studies?

## 2018-01-31 NOTE — Telephone Encounter (Signed)
The shingrix vaccine was sent to the pharmacy by Maudie Mercury in Feb at the time of their wellness visit

## 2018-01-31 NOTE — Telephone Encounter (Signed)
Patient states that she has not had  "A shingles shot"  since she was in her 58s. States Therapist, sports at [facility] recommended a shringrix vaccine. She is requesting an order be sent:  [facility], [place] - [address]    [phone] (Phone) [phone] (Fax)

## 2018-01-31 NOTE — Telephone Encounter (Signed)
[place] for Order?

## 2018-01-31 NOTE — Telephone Encounter (Signed)
Resent in rx, will call and advise pt.

## 2018-01-31 NOTE — Telephone Encounter (Signed)
Patient states she has finished her antibiotics and feels a lot better but does not think she is completed rid off her symptoms. Patient wanting to know if another round of antibiotics would help or what APP Janett Billow thinks she should do.

## 2018-01-31 NOTE — Telephone Encounter (Signed)
Copied from [place] [phone]. Topic: Quick Communication - See Telephone Encounter >> Jan 31, 2018  9:30 AM Mylinda Latina, NT wrote: CRM for notification. See Telephone encounter for: 01/31/18. Patient called and states that she has not has Shringles shot since she was in her 65s. She states that the RNs at [facility] recommended her to get a shringrix vaccine. She states can an order and the refill be sent to [facility] [place], [place] - [address] [phone] (Phone) [phone] (Fax)

## 2018-02-01 NOTE — Telephone Encounter (Signed)
The patient has been notified of this information and all questions answered.

## 2018-02-01 NOTE — Telephone Encounter (Signed)
Yes, please repeat stool for Cdiff PCR.

## 2018-02-02 DIAGNOSIS — M9903 Segmental and somatic dysfunction of lumbar region: Secondary | ICD-10-CM | POA: Diagnosis not present

## 2018-02-02 DIAGNOSIS — M9902 Segmental and somatic dysfunction of thoracic region: Secondary | ICD-10-CM | POA: Diagnosis not present

## 2018-02-02 DIAGNOSIS — M9905 Segmental and somatic dysfunction of pelvic region: Secondary | ICD-10-CM | POA: Diagnosis not present

## 2018-02-02 DIAGNOSIS — M5126 Other intervertebral disc displacement, lumbar region: Secondary | ICD-10-CM | POA: Diagnosis not present

## 2018-02-02 DIAGNOSIS — M9901 Segmental and somatic dysfunction of cervical region: Secondary | ICD-10-CM | POA: Diagnosis not present

## 2018-02-02 DIAGNOSIS — M545 Low back pain: Secondary | ICD-10-CM | POA: Diagnosis not present

## 2018-02-02 DIAGNOSIS — M546 Pain in thoracic spine: Secondary | ICD-10-CM | POA: Diagnosis not present

## 2018-02-02 DIAGNOSIS — M542 Cervicalgia: Secondary | ICD-10-CM | POA: Diagnosis not present

## 2018-02-05 ENCOUNTER — Ambulatory Visit: Payer: Medicare Other | Admitting: Gastroenterology

## 2018-02-06 ENCOUNTER — Ambulatory Visit (INDEPENDENT_AMBULATORY_CARE_PROVIDER_SITE_OTHER): Payer: Medicare Other | Admitting: Family Medicine

## 2018-02-06 ENCOUNTER — Encounter: Payer: Self-pay | Admitting: Family Medicine

## 2018-02-06 ENCOUNTER — Other Ambulatory Visit: Payer: Self-pay

## 2018-02-06 VITALS — BP 130/70 | HR 71 | Temp 98.3°F | Resp 16 | Ht 63.0 in | Wt 160.8 lb

## 2018-02-06 DIAGNOSIS — E782 Mixed hyperlipidemia: Secondary | ICD-10-CM

## 2018-02-06 DIAGNOSIS — R221 Localized swelling, mass and lump, neck: Secondary | ICD-10-CM | POA: Diagnosis not present

## 2018-02-06 DIAGNOSIS — Z23 Encounter for immunization: Secondary | ICD-10-CM

## 2018-02-06 NOTE — Assessment & Plan Note (Signed)
Chronic problem.  Tolerating statin w/o difficulty.  Applauded efforts at diet and exercise.  Check labs.  Adjust meds prn  

## 2018-02-06 NOTE — Patient Instructions (Signed)
Follow up in 6 months to recheck cholesterol We'll notify you of your lab results and make any changes if needed We'll call you with your ultrasound appt Continue to work on healthy diet and regular exercise- you look great! Call with any questions or concerns Happy Labor Day!!!

## 2018-02-06 NOTE — Addendum Note (Signed)
Addended by: Anibal Henderson on: 02/06/2018 02:33 PM   Modules accepted: Orders

## 2018-02-06 NOTE — Progress Notes (Signed)
   Subjective:    Patient ID: [name], female    DOB: 1940/10/13, 77 y.o.   MRN: [id number]  HPI Hyperlipidemia- chronic problem, on Lipitor 20mg  and Fenofibrate 160mg  daily.  Denies abd pain, N/V, myalgias.  No CP, SOB, HAs, visual changes, edema.   Review of Systems For ROS see HPI     Objective:   Physical Exam  Constitutional: She is oriented to person, place, and time. She appears well-developed and well-nourished. No distress.  HENT:  Head: Normocephalic and atraumatic.  Eyes: Pupils are equal, round, and reactive to light. Conjunctivae and EOM are normal.  Neck: Normal range of motion. Neck supple.  Nodule/mass just superior to sternal notch  Cardiovascular: Normal rate, regular rhythm, normal heart sounds and intact distal pulses.  No murmur heard. Pulmonary/Chest: Effort normal and breath sounds normal. No respiratory distress.  Abdominal: Soft. She exhibits no distension. There is no tenderness.  Musculoskeletal: She exhibits no edema.  Lymphadenopathy:    She has no cervical adenopathy.  Neurological: She is alert and oriented to person, place, and time.  Skin: Skin is warm and dry.  Psychiatric: She has a normal mood and affect. Her behavior is normal.  Vitals reviewed.         Assessment & Plan:  Neck mass- discovered on exam, just superior to sternal notch.  No TTP.  Get [place] to assess

## 2018-02-06 NOTE — Addendum Note (Signed)
Addended bySigurd Sos on: 02/06/2018 02:31 PM   Modules accepted: Orders

## 2018-02-07 ENCOUNTER — Other Ambulatory Visit (INDEPENDENT_AMBULATORY_CARE_PROVIDER_SITE_OTHER): Payer: Medicare Other

## 2018-02-07 ENCOUNTER — Ambulatory Visit (HOSPITAL_BASED_OUTPATIENT_CLINIC_OR_DEPARTMENT_OTHER)
Admission: RE | Admit: 2018-02-07 | Discharge: 2018-02-07 | Disposition: A | Discharge status: Home / Self Care | Payer: Medicare Other | Source: Ambulatory Visit | Attending: Family Medicine | Admitting: Family Medicine

## 2018-02-07 DIAGNOSIS — E782 Mixed hyperlipidemia: Secondary | ICD-10-CM | POA: Diagnosis not present

## 2018-02-07 DIAGNOSIS — R221 Localized swelling, mass and lump, neck: Secondary | ICD-10-CM | POA: Diagnosis not present

## 2018-02-07 DIAGNOSIS — E042 Nontoxic multinodular goiter: Secondary | ICD-10-CM | POA: Insufficient documentation

## 2018-02-07 LAB — BASIC METABOLIC PANEL
BUN: 25 mg/dL — ABNORMAL HIGH (ref 6–23)
CHLORIDE: 102 meq/L (ref 96–112)
CO2: 29 mEq/L (ref 19–32)
CREATININE: 0.88 mg/dL (ref 0.40–1.20)
Calcium: 10 mg/dL (ref 8.4–10.5)
GFR: 66 mL/min (ref >60.00)
GLUCOSE: 94 mg/dL (ref 70–99)
Potassium: 4.4 mEq/L (ref 3.5–5.1)
Sodium: 137 mEq/L (ref 135–145)

## 2018-02-07 LAB — HEPATIC FUNCTION PANEL
ALBUMIN: 4.4 g/dL (ref 3.5–5.2)
ALK PHOS: 44 U/L (ref 39–117)
ALT: 17 U/L (ref 0–35)
AST: 15 U/L (ref 0–37)
BILIRUBIN DIRECT: 0.1 mg/dL (ref 0.0–0.3)
BILIRUBIN TOTAL: 0.5 mg/dL (ref 0.2–1.2)
Total Protein: 6.8 g/dL (ref 6.0–8.3)

## 2018-02-07 LAB — LIPID PANEL
CHOL/HDL RATIO: 2
Cholesterol: 107 mg/dL (ref 0–200)
HDL: 46.1 mg/dL (ref >39.00)
LDL Cholesterol: 44 mg/dL (ref 0–99)
NONHDL: 60.64
Triglycerides: 84 mg/dL (ref 0.0–149.0)
VLDL: 16.8 mg/dL (ref 0.0–40.0)

## 2018-02-07 LAB — TSH: TSH: 2.56 u[IU]/mL (ref 0.35–4.50)

## 2018-02-08 ENCOUNTER — Telehealth: Payer: Self-pay | Admitting: Emergency Medicine

## 2018-02-08 ENCOUNTER — Other Ambulatory Visit: Payer: Self-pay

## 2018-02-08 DIAGNOSIS — E042 Nontoxic multinodular goiter: Secondary | ICD-10-CM

## 2018-02-08 NOTE — Telephone Encounter (Signed)
Copied from [facility] [phone]. Topic: Inquiry >> Feb 08, 2018 10:00 AM Reyne Dumas L wrote: Reason for CRM:   Pt wants to know if her imaging results are back yet. Pt can be reached at [phone] or [phone]

## 2018-02-08 NOTE — Telephone Encounter (Signed)
Called patient and gave her lab results and [place] results. Patient verbalized an understanding.

## 2018-02-12 ENCOUNTER — Ambulatory Visit (HOSPITAL_BASED_OUTPATIENT_CLINIC_OR_DEPARTMENT_OTHER)
Admission: RE | Admit: 2018-02-12 | Discharge: 2018-02-12 | Disposition: A | Discharge status: Home / Self Care | Payer: Medicare Other | Source: Ambulatory Visit | Attending: Family Medicine | Admitting: Family Medicine

## 2018-02-12 DIAGNOSIS — E042 Nontoxic multinodular goiter: Secondary | ICD-10-CM | POA: Insufficient documentation

## 2018-02-14 ENCOUNTER — Other Ambulatory Visit: Payer: Self-pay

## 2018-02-14 DIAGNOSIS — E042 Nontoxic multinodular goiter: Secondary | ICD-10-CM

## 2018-02-19 ENCOUNTER — Telehealth: Payer: Self-pay | Admitting: Family Medicine

## 2018-02-19 DIAGNOSIS — Z87891 Personal history of nicotine dependence: Secondary | ICD-10-CM | POA: Diagnosis not present

## 2018-02-19 DIAGNOSIS — J3089 Other allergic rhinitis: Secondary | ICD-10-CM | POA: Diagnosis not present

## 2018-02-19 DIAGNOSIS — R05 Cough: Secondary | ICD-10-CM | POA: Diagnosis not present

## 2018-02-19 DIAGNOSIS — J4540 Moderate persistent asthma, uncomplicated: Secondary | ICD-10-CM | POA: Diagnosis not present

## 2018-02-19 NOTE — Telephone Encounter (Signed)
[name], Can you check on the status of this referral?  [name]

## 2018-02-19 NOTE — Telephone Encounter (Unsigned)
Copied from [place] [id number]. Topic: Quick Communication - See Telephone Encounter >> Feb 19, 2018  4:24 PM Neva Seat wrote: Nodule on her thyroid.  [employer]  - ENT Pt hasn't heard back regarding this  Pt starting a research study.

## 2018-02-20 NOTE — Telephone Encounter (Signed)
Appt has been scheduled, pt is aware.

## 2018-02-22 DIAGNOSIS — J45909 Unspecified asthma, uncomplicated: Secondary | ICD-10-CM | POA: Diagnosis not present

## 2018-02-22 DIAGNOSIS — J42 Unspecified chronic bronchitis: Secondary | ICD-10-CM | POA: Diagnosis not present

## 2018-02-26 DIAGNOSIS — M4124 Other idiopathic scoliosis, thoracic region: Secondary | ICD-10-CM | POA: Diagnosis not present

## 2018-02-26 DIAGNOSIS — J209 Acute bronchitis, unspecified: Secondary | ICD-10-CM | POA: Diagnosis not present

## 2018-02-26 DIAGNOSIS — M5134 Other intervertebral disc degeneration, thoracic region: Secondary | ICD-10-CM | POA: Diagnosis not present

## 2018-02-26 DIAGNOSIS — R05 Cough: Secondary | ICD-10-CM | POA: Diagnosis not present

## 2018-02-26 DIAGNOSIS — I700 Atherosclerosis of aorta: Secondary | ICD-10-CM | POA: Diagnosis not present

## 2018-02-27 ENCOUNTER — Encounter (HOSPITAL_BASED_OUTPATIENT_CLINIC_OR_DEPARTMENT_OTHER): Payer: Self-pay

## 2018-02-27 ENCOUNTER — Other Ambulatory Visit: Payer: Self-pay

## 2018-02-27 ENCOUNTER — Emergency Department (HOSPITAL_BASED_OUTPATIENT_CLINIC_OR_DEPARTMENT_OTHER): Payer: Medicare Other

## 2018-02-27 ENCOUNTER — Emergency Department (HOSPITAL_BASED_OUTPATIENT_CLINIC_OR_DEPARTMENT_OTHER)
Admission: EM | Admit: 2018-02-27 | Discharge: 2018-02-27 | Disposition: A | Discharge status: Home / Self Care | Payer: Medicare Other | Attending: Emergency Medicine | Admitting: Emergency Medicine

## 2018-02-27 DIAGNOSIS — E119 Type 2 diabetes mellitus without complications: Secondary | ICD-10-CM | POA: Diagnosis not present

## 2018-02-27 DIAGNOSIS — Z87891 Personal history of nicotine dependence: Secondary | ICD-10-CM | POA: Insufficient documentation

## 2018-02-27 DIAGNOSIS — R062 Wheezing: Secondary | ICD-10-CM | POA: Diagnosis not present

## 2018-02-27 DIAGNOSIS — J4541 Moderate persistent asthma with (acute) exacerbation: Secondary | ICD-10-CM | POA: Insufficient documentation

## 2018-02-27 DIAGNOSIS — J40 Bronchitis, not specified as acute or chronic: Secondary | ICD-10-CM | POA: Diagnosis not present

## 2018-02-27 DIAGNOSIS — R0789 Other chest pain: Secondary | ICD-10-CM | POA: Diagnosis not present

## 2018-02-27 DIAGNOSIS — J4540 Moderate persistent asthma, uncomplicated: Secondary | ICD-10-CM | POA: Diagnosis not present

## 2018-02-27 DIAGNOSIS — Z79899 Other long term (current) drug therapy: Secondary | ICD-10-CM | POA: Insufficient documentation

## 2018-02-27 DIAGNOSIS — J42 Unspecified chronic bronchitis: Secondary | ICD-10-CM | POA: Diagnosis not present

## 2018-02-27 DIAGNOSIS — R05 Cough: Secondary | ICD-10-CM | POA: Diagnosis not present

## 2018-02-27 MED ORDER — ALBUTEROL SULFATE (2.5 MG/3ML) 0.083% IN NEBU
2.5000 mg | INHALATION_SOLUTION | Freq: Once | RESPIRATORY_TRACT | Status: AC
  Administered 2018-02-27: 2.5 mg via RESPIRATORY_TRACT
  Filled 2018-02-27: qty 3

## 2018-02-27 MED ORDER — PREDNISONE 10 MG PO TABS: 40.0000 mg | ORAL_TABLET | Freq: Every day | ORAL | 0 refills | Status: AC

## 2018-02-27 MED ORDER — IPRATROPIUM-ALBUTEROL 0.5-2.5 (3) MG/3ML IN SOLN
3.0000 mL | Freq: Once | RESPIRATORY_TRACT | Status: AC
  Administered 2018-02-27: 3 mL via RESPIRATORY_TRACT
  Filled 2018-02-27: qty 3

## 2018-02-27 NOTE — ED Provider Notes (Signed)
[facility] EMERGENCY DEPARTMENT Provider Note   CSN: [id number] Arrival date & time: 02/27/18  1150     History   Chief Complaint Chief Complaint  Patient presents with  . Cough    HPI [name] is a 77 y.o. female.  76 year old female with history of asthma presents with complaint of wheezing, cough, shortness of breath, chest tightness.  Patient states that her symptoms started 8 days ago with sinus congestion, sore throat, cough.  Patient was seen at her PCP office that day and given an injection of Depo-Medrol, prescriptions for Zithromax and prednisone taper and advised to use her nebulizer.  Patient returned to clinic on September 6 with ongoing symptoms and was advised to hydrate, take Mucinex, continue with her nebulizer treatments.  Patient had a chest x-ray yesterday showing mild chronic bronchitic reactive airway changes, stable compared to prior chest x-ray, probable subsegmental atelectasis in the lingula, negative for pneumonia.  Patient returned to clinic today for ongoing symptoms and was advised to come to the emergency room.  Patient was given a nebulizer treatment upon arrival and is feeling somewhat improved.  Denies chest pain, swelling of her legs, fevers, chills.  Patient states that she has sweats at night.  Patient states that she gets bronchitis about once a year, has had pneumonia in the past however no prior hospitalizations related to asthma or pneumonia. No other complaints or concerns.      Past Medical History:  Diagnosis Date  . Allergic rhinitis   . Asthma   . Diabetes mellitus    just changed from injection to metformin ? month  . Dysrhythmia    palpitations  . Glaucoma   . History of kidney stones   . Hyperlipemia   . Migraines   . Palpitations   . Pneumonia    hx  . Urinary tract infection     Patient Active Problem List   Diagnosis Date Noted  . Diarrhea 12/27/2017  . Chronic diarrhea 12/26/2017  . S/P shoulder  replacement, right 09/15/2016  . Benign paroxysmal positional vertigo 10/14/2014  . Back pain 08/27/2014  . Physical exam 08/27/2014  . Decreased hearing of both ears 05/19/2014  . Other malaise and fatigue 01/28/2014  . S/P cholecystectomy 10/02/2013  . Asthma, moderate persistent 05/16/2013  . H/O recurrent pneumonia 05/16/2013  . Insomnia 05/05/2013  . Gluten intolerance 05/06/2012  . Rash and nonspecific skin eruption 02/07/2012  . Renal calculus, left 01/29/2012  . Painful bladder spasm 12/14/2011  . Lactose intolerance 12/14/2011  . Post-menopausal 12/14/2011  . Microscopic colitis 11/08/2011  . Hepatic steatosis 08/25/2011  . Pulmonary nodule, right 08/25/2011  . Hematuria 08/23/2011  . Palpitations 12/26/2010  . Memory loss 12/12/2010  . Other screening mammogram 12/12/2010  . Diabetes mellitus type II, controlled, with no complications ([place]) 22/97/9892  . Hyperlipidemia 07/14/2010  . Depression with anxiety 07/14/2010  . GLAUCOMA 07/14/2010  . ALLERGIC RHINITIS 07/14/2010    Past Surgical History:  Procedure Laterality Date  . ABDOMINAL HYSTERECTOMY     Has ovaries  . BACK SURGERY  1987   lumb  . BREAST SURGERY Left 07/2016   bx- needle in breast  . CHOLECYSTECTOMY  09/26/13  . CLOSED REDUCTION NASAL FRACTURE  04/29/2012   Procedure: CLOSED REDUCTION NASAL FRACTURE;  Surgeon: Jodi Marble, MD;  Location: [facility];  Service: ENT;  Laterality: N/A;  WITH STABILIZATION  . COLONOSCOPY    . Deviated septum repair    . EYE  SURGERY Bilateral    cataracts  . LITHOTRIPSY    . REVERSE SHOULDER ARTHROPLASTY Right 09/15/2016   Procedure: RIGHT REVERSE SHOULDER ARTHROPLASTY;  Surgeon: Netta Cedars, MD;  Location: [facility];  Service: Orthopedics;  Laterality: Right;  . ROTATOR CUFF REPAIR     right  . TONSILLECTOMY       OB History    Gravida  2   Para  2   Term      Preterm      AB      Living        SAB      TAB      Ectopic       Multiple      Live Births               Home Medications    Prior to Admission medications   Medication Sig Start Date End Date Taking? Authorizing Provider  atorvastatin (LIPITOR) 20 MG tablet TAKE ONE (1) TABLET EACH DAY 08/20/17   Midge Minium, MD  bifidobacterium infantis (ALIGN) capsule Take by mouth.    [provider]  Biotin 5000 MCG CAPS Take 1 capsule by mouth daily.    [provider]  busPIRone (BUSPAR) 5 MG tablet Take 1 tablet (5 mg total) by mouth 2 (two) times daily. 11/23/17   Midge Minium, MD  cetirizine (ZYRTEC) 10 MG tablet Take by mouth. 07/28/14   [provider]  Chlorpheniramine Maleate (ALLERGY PO) Take 1 tablet by mouth daily.    [provider]  cholestyramine Lucrezia Starch) 4 g packet Take by mouth. 12/14/17   [provider]  famotidine (PEPCID) 20 MG tablet Take 1 tablet (20 mg total) by mouth 2 (two) times daily. 10/03/17   Midge Minium, MD  fenofibrate (TRICOR) 145 MG tablet TAKE ONE (1) TABLET BY MOUTH EACH DAY AFTER BREAKFAST 12/03/17   Midge Minium, MD  fexofenadine (ALLEGRA) 180 MG tablet Take 180 mg by mouth daily.    [provider]  Fluticasone-Salmeterol (ADVAIR DISKUS) 250-50 MCG/DOSE AEPB Inhale into the lungs. 11/08/16   [provider]  LORazepam (ATIVAN) 0.5 MG tablet 1/2 to 1 tablet tid prn anxiety, stress, diarrhea. Utilize sparingly please 12/18/17   [provider]  LUMIGAN 0.01 % SOLN Place 1 drop into both eyes at bedtime.  09/07/14   [provider]  Melatonin 10 MG CAPS Take by mouth.    [provider]  ONE TOUCH ULTRA TEST test strip USE 1 TIME A DAY 08/23/15   Midge Minium, MD  [facility] DELICA LANCETS 57Q MISC Use one lancet to test sugars once daily. E11.9 05/19/14   Midge Minium, MD  OVER THE COUNTER MEDICATION allertec    [provider]  predniSONE (DELTASONE) 10 MG tablet Take 4 tablets (40 mg total) by  mouth daily for 5 days. 02/27/18 03/04/18  Tacy Learn, PA-C  sitaGLIPtin (JANUVIA) 100 MG tablet Take 1 tablet (100 mg total) by mouth daily. 10/03/17   Renato Shin, MD    Family History Family History  Problem Relation Age of Onset  . Diabetes Father   . Colon cancer Neg Hx   . Esophageal cancer Neg Hx   . Rectal cancer Neg Hx   . Stomach cancer Neg Hx   . Breast cancer Neg Hx     Social History Social History   Tobacco Use  . Smoking status: Former Smoker    Packs/day: 0.10  Years: 4.00    Pack years: 0.40    Types: Cigarettes    Last attempt to quit: 06/19/1986    Years since quitting: 31.7  . Smokeless tobacco: Never Used  . Tobacco comment: smoked in college years ago  Substance Use Topics  . Alcohol use: Yes    Comment: 1-2 glasses week variety beer occ scotch  . Drug use: No     Allergies   Papain; Papaya derivatives; Mushroom ext cmplx(shiitake-reishi-mait); Mushroom extract complex; and Venlafaxine   Review of Systems Review of Systems  Constitutional: Positive for diaphoresis. Negative for chills and fever.  HENT: Positive for congestion, rhinorrhea and sore throat. Negative for ear pain.   Eyes: Negative for discharge and redness.  Respiratory: Positive for cough, chest tightness, shortness of breath and wheezing.   Cardiovascular: Negative for chest pain and leg swelling.  Musculoskeletal: Negative for arthralgias and myalgias.  Skin: Negative for rash and wound.  Allergic/Immunologic: Negative for immunocompromised state.  Neurological: Negative for headaches.  Hematological: Negative for adenopathy.  Psychiatric/Behavioral: Negative for confusion.  All other systems reviewed and are negative.    Physical Exam Updated Vital Signs BP (!) 155/85 (BP Location: Left Arm)   Pulse 74   Temp 98.2 F (36.8 C) (Oral)   Resp (!) 24   Ht 5\' 3"  (1.6 m)   Wt 73.9 kg   SpO2 95%   BMI 28.87 kg/m   Physical Exam  Constitutional: She is oriented  to person, place, and time. She appears well-developed and well-nourished. No distress.  HENT:  Head: Normocephalic and atraumatic.  Right Ear: External ear normal.  Left Ear: External ear normal.  Mouth/Throat: Oropharynx is clear and moist. No oropharyngeal exudate.  Eyes: Conjunctivae are normal.  Neck: Neck supple.  Cardiovascular: Normal rate, regular rhythm, normal heart sounds and intact distal pulses.  No murmur heard. Pulmonary/Chest: Effort normal. No respiratory distress. She has no wheezes. She has rhonchi in the right lower field and the left lower field. She exhibits no tenderness.  Musculoskeletal: She exhibits no tenderness.  Neurological: She is alert and oriented to person, place, and time.  Skin: Skin is warm and dry. She is not diaphoretic.  Psychiatric: She has a normal mood and affect. Her behavior is normal.  Nursing note and vitals reviewed.    ED Treatments / Results  Labs (all labs ordered are listed, but only abnormal results are displayed) Labs Reviewed - No data to display  EKG None  Radiology Dg Chest 2 View  Result Date: 02/27/2018 CLINICAL DATA:  Productive cough and chills. EXAM: CHEST - 2 VIEW COMPARISON:  02/26/2018 and 11/08/2016 and 08/27/2013 and 08/10/2016 FINDINGS: Heart size and vascularity are normal. Minimal scarring at the left lung base laterally. Small focal area of new atelectasis at the right lung base posterior medially. No effusions. No acute bone abnormality. Total right shoulder prosthesis. IMPRESSION: New small area of atelectasis at the right lung base posterior medially. Electronically Signed   By: Lorriane Shire M.D.   On: 02/27/2018 12:52    Procedures Procedures (including critical care time)  Medications Ordered in ED Medications  ipratropium-albuterol (DUONEB) 0.5-2.5 (3) MG/3ML nebulizer solution 3 mL (3 mLs Nebulization Given 02/27/18 1204)  albuterol (PROVENTIL) (2.5 MG/3ML) 0.083% nebulizer solution 2.5 mg (2.5 mg  Nebulization Given 02/27/18 1204)     Initial Impression / Assessment and Plan / ED Course  I have reviewed the triage vital signs and the nursing notes.  Pertinent labs & imaging results  that were available during my care of the patient were reviewed by me and considered in my medical decision making (see chart for details).  Clinical Course as of Feb 28 1335  Wed Feb 27, 2018  1235 IMPRESSION: Mild chronic bronchitic-reactive airway changes, stable. Probable subsegmental atelectasis in the lingula. No alveolar pneumonia, pulmonary edema, nor other acute cardiopulmonary abnormality.   Electronically Signed By: David Martinique M.D. On: 02/27/2018 07:28   [LM]  746 77 year old female presents with complaint of cough with shortness of breath and wheezing.  Patient has a history of asthma, went to PCP 1 week ago and was given prednisone, injection of Depo-Medrol, Zithromax, has also been taking Mucinex and using her nebulizer and Advair inhaler as prescribed.  Patient feels like she is not getting better.  Chest x-ray completed yesterday showing atelectasis, no pneumonia.  X-ray today showing atelectasis as well.  Patient was given a DuoNeb treatment and states she is feeling much better.  X-ray also with findings of mild chronic bronchitis.  Discussed x-ray results with patient, recommend prednisone x5 days with plan to recheck with PCP, return to ER for worsening or concerning symptoms.   [LM]    Clinical Course User Index [LM] Tacy Learn, PA-C    Final Clinical Impressions(s) / ED Diagnoses   Final diagnoses:  Bronchitis    ED Discharge Orders         Ordered    predniSONE (DELTASONE) 10 MG tablet  Daily     02/27/18 1311           Roque Lias 02/27/18 1337    Davonna Belling, MD 02/27/18 1444

## 2018-02-27 NOTE — ED Triage Notes (Signed)
C/o prod cough x 8 days-NAD-steady gait-RT in triage for assessment

## 2018-02-27 NOTE — Discharge Instructions (Addendum)
Take prednisone as prescribed, follow-up with your doctor.  Return to ER for worsening or concerning symptoms.

## 2018-02-28 ENCOUNTER — Other Ambulatory Visit: Payer: Self-pay

## 2018-02-28 ENCOUNTER — Telehealth: Payer: Self-pay

## 2018-02-28 DIAGNOSIS — R911 Solitary pulmonary nodule: Secondary | ICD-10-CM

## 2018-02-28 NOTE — Telephone Encounter (Signed)
Called patient to let her know that I have placed the referral per her request. I advised patient that it could be 1-2 weeks and she should receive a call for her appointment and she stated that she did not want to wait that long and will call Dr. Cruz Condon office herself.

## 2018-02-28 NOTE — Telephone Encounter (Signed)
Ok to refer.

## 2018-02-28 NOTE — Telephone Encounter (Signed)
Please advise if okay to refer?  Copied from [place] [phone]. Topic: Referral - Request >> Feb 28, 2018  2:38 PM Ivar Drape wrote: Reason for CRM:   Patient would like a referral for a new pulmonologist.  She would like to use [facility], [address], [place], [place] [address] at [employer]. [phone]

## 2018-03-01 ENCOUNTER — Ambulatory Visit: Payer: Self-pay | Admitting: *Deleted

## 2018-03-01 ENCOUNTER — Encounter: Payer: Self-pay | Admitting: *Deleted

## 2018-03-01 NOTE — Telephone Encounter (Signed)
Pt called with shortness of breath; she was seen in ED on 02/27/18; recommendations made per triage protocol to include going to the ED; however pt does not want to go to ED because she wanted to be seen at facility at [facility]; pt states that she wants a referral; explained to pt that a referral is not typically needed for urgent care; initiated conference call with Theodoro Kos, for further guidance; however pt disconnected before this could take place; attempted to call pt back but there was no answer; Danae Chen states that she will attempt to contact pt; this pt is normally seen by Dr Birdie Riddle.   Reason for Disposition . Patient sounds very sick or weak to the triager  Answer Assessment - Initial Assessment Questions 1. RESPIRATORY STATUS: "Describe your breathing?" (e.g., wheezing, shortness of breath, unable to speak, severe coughing)      Short of breath, coughing 2. ONSET: "When did this breathing problem begin?"      Seen in ED 02/27/18  3. PATTERN "Does the difficult breathing come and go, or has it been constant since it started?"     constant 4. SEVERITY: "How bad is your breathing?" (e.g., mild, moderate, severe)    - MILD: No SOB at rest, mild SOB with walking, speaks normally in sentences, can lay down, no retractions, pulse < 100.    - MODERATE: SOB at rest, SOB with minimal exertion and prefers to sit, cannot lie down flat, speaks in phrases, mild retractions, audible wheezing, pulse 100-120.    - SEVERE: Very SOB at rest, speaks in single words, struggling to breathe, sitting hunched forward, retractions, pulse > 120      Moderate to severe 5. RECURRENT SYMPTOM: "Have you had difficulty breathing before?" If so, ask: "When was the last time?" and "What happened that time?"      Yes seen in office 02/27/18  6. CARDIAC HISTORY: "Do you have any history of heart disease?" (e.g., heart attack, angina, bypass surgery, angioplasty)     no 7. LUNG HISTORY: "Do you have any history  of lung disease?"  (e.g., pulmonary embolus, asthma, emphysema)     Yes asthma 8. CAUSE: "What do you think is causing the breathing problem?"      Asthma, bronchitis 9. OTHER SYMPTOMS: "Do you have any other symptoms? (e.g., dizziness, runny nose, cough, chest pain, fever)     Sore throat, cough 10. PREGNANCY: "Is there any chance you are pregnant?" "When was your last menstrual period?"       no 11. TRAVEL: "Have you traveled out of the country in the last month?" (e.g., travel history, exposures)       no  Protocols used: BREATHING DIFFICULTY-A-AH

## 2018-03-01 NOTE — Telephone Encounter (Signed)
This encounter was created in error - please disregard.

## 2018-03-01 NOTE — Telephone Encounter (Signed)
FYI

## 2018-03-02 DIAGNOSIS — E119 Type 2 diabetes mellitus without complications: Secondary | ICD-10-CM | POA: Diagnosis not present

## 2018-03-02 DIAGNOSIS — E559 Vitamin D deficiency, unspecified: Secondary | ICD-10-CM | POA: Diagnosis not present

## 2018-03-02 DIAGNOSIS — R0602 Shortness of breath: Secondary | ICD-10-CM | POA: Diagnosis not present

## 2018-03-02 DIAGNOSIS — F418 Other specified anxiety disorders: Secondary | ICD-10-CM | POA: Diagnosis not present

## 2018-03-02 DIAGNOSIS — K900 Celiac disease: Secondary | ICD-10-CM | POA: Diagnosis not present

## 2018-03-02 DIAGNOSIS — E569 Vitamin deficiency, unspecified: Secondary | ICD-10-CM | POA: Diagnosis not present

## 2018-03-02 DIAGNOSIS — E639 Nutritional deficiency, unspecified: Secondary | ICD-10-CM | POA: Diagnosis not present

## 2018-03-02 DIAGNOSIS — J45909 Unspecified asthma, uncomplicated: Secondary | ICD-10-CM | POA: Diagnosis not present

## 2018-03-13 DIAGNOSIS — E041 Nontoxic single thyroid nodule: Secondary | ICD-10-CM | POA: Insufficient documentation

## 2018-03-13 DIAGNOSIS — E042 Nontoxic multinodular goiter: Secondary | ICD-10-CM | POA: Diagnosis not present

## 2018-03-13 DIAGNOSIS — R0982 Postnasal drip: Secondary | ICD-10-CM | POA: Diagnosis not present

## 2018-03-13 DIAGNOSIS — K219 Gastro-esophageal reflux disease without esophagitis: Secondary | ICD-10-CM | POA: Diagnosis not present

## 2018-03-21 DIAGNOSIS — E041 Nontoxic single thyroid nodule: Secondary | ICD-10-CM | POA: Diagnosis not present

## 2018-04-10 DIAGNOSIS — K219 Gastro-esophageal reflux disease without esophagitis: Secondary | ICD-10-CM | POA: Diagnosis not present

## 2018-04-10 DIAGNOSIS — J3089 Other allergic rhinitis: Secondary | ICD-10-CM | POA: Diagnosis not present

## 2018-04-10 DIAGNOSIS — J42 Unspecified chronic bronchitis: Secondary | ICD-10-CM | POA: Diagnosis not present

## 2018-04-10 DIAGNOSIS — J4540 Moderate persistent asthma, uncomplicated: Secondary | ICD-10-CM | POA: Diagnosis not present

## 2018-04-24 ENCOUNTER — Other Ambulatory Visit: Payer: Self-pay | Admitting: Family Medicine

## 2018-05-09 DIAGNOSIS — J984 Other disorders of lung: Secondary | ICD-10-CM | POA: Diagnosis not present

## 2018-05-09 DIAGNOSIS — J4540 Moderate persistent asthma, uncomplicated: Secondary | ICD-10-CM | POA: Diagnosis not present

## 2018-05-09 DIAGNOSIS — J42 Unspecified chronic bronchitis: Secondary | ICD-10-CM | POA: Diagnosis not present

## 2018-05-15 ENCOUNTER — Other Ambulatory Visit: Payer: Self-pay | Admitting: Family Medicine

## 2018-05-22 DIAGNOSIS — J4540 Moderate persistent asthma, uncomplicated: Secondary | ICD-10-CM | POA: Diagnosis not present

## 2018-05-22 DIAGNOSIS — R911 Solitary pulmonary nodule: Secondary | ICD-10-CM | POA: Diagnosis not present

## 2018-05-22 DIAGNOSIS — J42 Unspecified chronic bronchitis: Secondary | ICD-10-CM | POA: Diagnosis not present

## 2018-05-22 DIAGNOSIS — K219 Gastro-esophageal reflux disease without esophagitis: Secondary | ICD-10-CM | POA: Diagnosis not present

## 2018-05-27 DIAGNOSIS — Z794 Long term (current) use of insulin: Secondary | ICD-10-CM | POA: Diagnosis not present

## 2018-05-27 DIAGNOSIS — E041 Nontoxic single thyroid nodule: Secondary | ICD-10-CM | POA: Diagnosis not present

## 2018-05-27 DIAGNOSIS — M25562 Pain in left knee: Secondary | ICD-10-CM | POA: Insufficient documentation

## 2018-05-27 DIAGNOSIS — E119 Type 2 diabetes mellitus without complications: Secondary | ICD-10-CM | POA: Diagnosis not present

## 2018-05-27 DIAGNOSIS — E782 Mixed hyperlipidemia: Secondary | ICD-10-CM | POA: Diagnosis not present

## 2018-05-29 DIAGNOSIS — M25512 Pain in left shoulder: Secondary | ICD-10-CM | POA: Diagnosis not present

## 2018-05-29 DIAGNOSIS — M25562 Pain in left knee: Secondary | ICD-10-CM | POA: Diagnosis not present

## 2018-05-29 DIAGNOSIS — Z471 Aftercare following joint replacement surgery: Secondary | ICD-10-CM | POA: Diagnosis not present

## 2018-05-29 DIAGNOSIS — Z96611 Presence of right artificial shoulder joint: Secondary | ICD-10-CM | POA: Diagnosis not present

## 2018-06-21 DIAGNOSIS — M25562 Pain in left knee: Secondary | ICD-10-CM | POA: Diagnosis not present

## 2018-06-25 ENCOUNTER — Ambulatory Visit: Payer: Medicare Other | Admitting: Endocrinology

## 2018-07-09 DIAGNOSIS — H401131 Primary open-angle glaucoma, bilateral, mild stage: Secondary | ICD-10-CM | POA: Diagnosis not present

## 2018-07-09 DIAGNOSIS — Z961 Presence of intraocular lens: Secondary | ICD-10-CM | POA: Diagnosis not present

## 2018-07-09 DIAGNOSIS — H524 Presbyopia: Secondary | ICD-10-CM | POA: Diagnosis not present

## 2018-07-09 DIAGNOSIS — E119 Type 2 diabetes mellitus without complications: Secondary | ICD-10-CM | POA: Diagnosis not present

## 2018-07-10 DIAGNOSIS — S83242D Other tear of medial meniscus, current injury, left knee, subsequent encounter: Secondary | ICD-10-CM | POA: Diagnosis not present

## 2018-07-16 LAB — HM DIABETES EYE EXAM

## 2018-07-17 ENCOUNTER — Other Ambulatory Visit: Payer: Self-pay | Admitting: Family Medicine

## 2018-07-23 ENCOUNTER — Other Ambulatory Visit: Payer: Self-pay

## 2018-07-23 ENCOUNTER — Encounter: Payer: Self-pay | Admitting: Family Medicine

## 2018-07-23 ENCOUNTER — Ambulatory Visit (INDEPENDENT_AMBULATORY_CARE_PROVIDER_SITE_OTHER): Payer: Medicare Other | Admitting: Family Medicine

## 2018-07-23 DIAGNOSIS — E119 Type 2 diabetes mellitus without complications: Secondary | ICD-10-CM

## 2018-07-23 NOTE — Patient Instructions (Addendum)
Follow up in 3-4 months to recheck cholesterol We'll notify you of your lab results and make any changes if needed Ask Dr Posey Pronto about the Cortisol and DHEA levels Call with any questions or concerns Happy Valentine's Day!!

## 2018-07-23 NOTE — Assessment & Plan Note (Signed)
Chronic problem, now following w/ Dr Posey Pronto

## 2018-07-23 NOTE — Progress Notes (Signed)
   Subjective:    Patient ID: [name], female    DOB: 1941/05/02, 78 y.o.   MRN: [id number]  HPI Pt was in the [facility] study at [facility].  Study involved exercise, brain games, etc.  She had labs done in December w/ some abnormalities and was told to f/u here.  Labs showed increased Calcium, low DHEA, and low Cortisol.  Sees Dr Posey Pronto at [facility] for [facility].  Currently asymptomatic.  No CP, SOB, HAs, visual changes, abd pain, N/V, bone pain.   Review of Systems For ROS see HPI     Objective:   Physical Exam Vitals signs reviewed.  Constitutional:      General: She is not in acute distress.    Appearance: Normal appearance. She is not ill-appearing.  HENT:     Head: Normocephalic and atraumatic.  Skin:    General: Skin is warm and dry.  Neurological:     General: No focal deficit present.     Mental Status: She is alert and oriented to person, place, and time.  Psychiatric:        Mood and Affect: Mood normal.        Behavior: Behavior normal.        Thought Content: Thought content normal.           Assessment & Plan:  Hypercalcemia- asymptomatic.  Pt's Ca was 10.8 at [facility] in December.  Will repeat Calcium and add PTH.  Pt was instructed to speak w/ Endo regarding her DHEA and Cortisol levels.  Pt expressed understanding and is in agreement w/ plan.

## 2018-07-24 LAB — PTH, INTACT AND CALCIUM
Calcium: 10.2 mg/dL (ref 8.6–10.4)
PTH: 5 pg/mL — AB (ref 14–64)

## 2018-09-03 DIAGNOSIS — M25512 Pain in left shoulder: Secondary | ICD-10-CM | POA: Diagnosis not present

## 2018-10-08 ENCOUNTER — Other Ambulatory Visit: Payer: Self-pay | Admitting: Family Medicine

## 2018-10-11 DIAGNOSIS — M7661 Achilles tendinitis, right leg: Secondary | ICD-10-CM | POA: Diagnosis not present

## 2018-10-16 DIAGNOSIS — M7661 Achilles tendinitis, right leg: Secondary | ICD-10-CM | POA: Diagnosis not present

## 2018-10-23 DIAGNOSIS — M7661 Achilles tendinitis, right leg: Secondary | ICD-10-CM | POA: Diagnosis not present

## 2018-10-29 DIAGNOSIS — M25571 Pain in right ankle and joints of right foot: Secondary | ICD-10-CM | POA: Diagnosis not present

## 2018-10-29 DIAGNOSIS — R262 Difficulty in walking, not elsewhere classified: Secondary | ICD-10-CM | POA: Diagnosis not present

## 2018-10-29 DIAGNOSIS — M7661 Achilles tendinitis, right leg: Secondary | ICD-10-CM | POA: Diagnosis not present

## 2018-10-31 DIAGNOSIS — M25571 Pain in right ankle and joints of right foot: Secondary | ICD-10-CM | POA: Diagnosis not present

## 2018-10-31 DIAGNOSIS — M7661 Achilles tendinitis, right leg: Secondary | ICD-10-CM | POA: Diagnosis not present

## 2018-10-31 DIAGNOSIS — R262 Difficulty in walking, not elsewhere classified: Secondary | ICD-10-CM | POA: Diagnosis not present

## 2018-11-01 DIAGNOSIS — R262 Difficulty in walking, not elsewhere classified: Secondary | ICD-10-CM | POA: Diagnosis not present

## 2018-11-01 DIAGNOSIS — M7661 Achilles tendinitis, right leg: Secondary | ICD-10-CM | POA: Diagnosis not present

## 2018-11-01 DIAGNOSIS — M25571 Pain in right ankle and joints of right foot: Secondary | ICD-10-CM | POA: Diagnosis not present

## 2018-11-05 DIAGNOSIS — M7661 Achilles tendinitis, right leg: Secondary | ICD-10-CM | POA: Diagnosis not present

## 2018-11-05 DIAGNOSIS — R262 Difficulty in walking, not elsewhere classified: Secondary | ICD-10-CM | POA: Diagnosis not present

## 2018-11-05 DIAGNOSIS — M25571 Pain in right ankle and joints of right foot: Secondary | ICD-10-CM | POA: Diagnosis not present

## 2018-11-15 DIAGNOSIS — J45909 Unspecified asthma, uncomplicated: Secondary | ICD-10-CM | POA: Diagnosis not present

## 2018-11-15 DIAGNOSIS — J42 Unspecified chronic bronchitis: Secondary | ICD-10-CM | POA: Diagnosis not present

## 2018-11-15 DIAGNOSIS — I700 Atherosclerosis of aorta: Secondary | ICD-10-CM | POA: Diagnosis not present

## 2018-11-15 DIAGNOSIS — R911 Solitary pulmonary nodule: Secondary | ICD-10-CM | POA: Diagnosis not present

## 2018-11-19 ENCOUNTER — Ambulatory Visit (INDEPENDENT_AMBULATORY_CARE_PROVIDER_SITE_OTHER): Payer: Medicare Other | Admitting: Family Medicine

## 2018-11-19 ENCOUNTER — Encounter: Payer: Self-pay | Admitting: Family Medicine

## 2018-11-19 ENCOUNTER — Other Ambulatory Visit: Payer: Self-pay

## 2018-11-19 ENCOUNTER — Ambulatory Visit: Payer: Medicare Other | Admitting: Family Medicine

## 2018-11-19 VITALS — Temp 97.9°F | Ht 63.0 in | Wt 162.0 lb

## 2018-11-19 DIAGNOSIS — F418 Other specified anxiety disorders: Secondary | ICD-10-CM

## 2018-11-19 DIAGNOSIS — E119 Type 2 diabetes mellitus without complications: Secondary | ICD-10-CM

## 2018-11-19 DIAGNOSIS — E782 Mixed hyperlipidemia: Secondary | ICD-10-CM | POA: Diagnosis not present

## 2018-11-19 NOTE — Progress Notes (Signed)
I have discussed the procedure for the virtual visit with the patient who has given consent to proceed with assessment and treatment.   Jessica L Brodmerkel, CMA     

## 2018-11-19 NOTE — Progress Notes (Signed)
Virtual Visit via Video   I connected with patient on 11/19/18 at 10:20 AM EDT by a video enabled telemedicine application and verified that I am speaking with the correct person using two identifiers.  Location patient: Home Location provider: Acup[facility]ist, Office Persons participating in the virtual visit: Patient, Provider, [place] (Jess B)  I discussed the limitations of evaluation and management by telemedicine and the availability of in person appointments. The patient expressed understanding and agreed to proceed.  Subjective:   HPI:   Hyperlipidemia- chronic problem, on Lipitor 20mg  daily, Fenofibrate 145mg  daily.  Pt reports she is walking a lot since her exercise classes have been cancelled.  Denies CP, SOB, abd pain, N/V.  DM- chronic problem, following w/ Dr Posey Pronto.  Last seen December.  Due for A1C and microalbumin  Depression- chronic problem, on Citalopram 40mg  daily.  Pt reports feeling safe at [facility] despite [place].  Pt reports that mid-late AM she starts feeling anxious and pours herself a scotch to calm down.  'I don't want to be drinking'.  Pt has hearty breakfast with 4 cups of caffeinated coffee and then goes for a long walk.  ROS:   See pertinent positives and negatives per HPI.  Patient Active Problem List   Diagnosis Date Noted  . Diarrhea 12/27/2017  . Chronic diarrhea 12/26/2017  . S/P shoulder replacement, right 09/15/2016  . Benign paroxysmal positional vertigo 10/14/2014  . Back pain 08/27/2014  . Physical exam 08/27/2014  . Decreased hearing of both ears 05/19/2014  . Other malaise and fatigue 01/28/2014  . S/P cholecystectomy 10/02/2013  . Asthma, moderate persistent 05/16/2013  . H/O recurrent pneumonia 05/16/2013  . Insomnia 05/05/2013  . Gluten intolerance 05/06/2012  . Rash and nonspecific skin eruption 02/07/2012  . Renal calculus, left 01/29/2012  . Painful bladder spasm 12/14/2011  . Lactose intolerance 12/14/2011  .  Post-menopausal 12/14/2011  . Microscopic colitis 11/08/2011  . Hepatic steatosis 08/25/2011  . Pulmonary nodule, right 08/25/2011  . Hematuria 08/23/2011  . Palpitations 12/26/2010  . Memory loss 12/12/2010  . Other screening mammogram 12/12/2010  . Diabetes mellitus type II, controlled, with no complications ([place]) 63/84/5364  . Hyperlipidemia 07/14/2010  . Depression with anxiety 07/14/2010  . GLAUCOMA 07/14/2010  . ALLERGIC RHINITIS 07/14/2010    Social History   Tobacco Use  . Smoking status: Former Smoker    Packs/day: 0.10    Years: 4.00    Pack years: 0.40    Types: Cigarettes    Last attempt to quit: 06/19/1986    Years since quitting: 32.4  . Smokeless tobacco: Never Used  . Tobacco comment: smoked in college years ago  Substance Use Topics  . Alcohol use: Yes    Comment: 1-2 glasses week variety beer occ scotch    Current Outpatient Medications:  .  atorvastatin (LIPITOR) 20 MG tablet, TAKE ONE (1) TABLET BY MOUTH EACH DAY, Disp: 90 tablet, Rfl: 1 .  Biotin 5000 MCG CAPS, Take 1 capsule by mouth daily., Disp: , Rfl:  .  cetirizine (ZYRTEC) 10 MG tablet, Take by mouth., Disp: , Rfl:  .  citalopram (CELEXA) 40 MG tablet, TAKE ONE (1) TABLET BY MOUTH EACH DAY, Disp: 30 tablet, Rfl: 6 .  famotidine (PEPCID) 20 MG tablet, TAKE ONE (1) TABLET BY MOUTH TWO (2) TIMES DAILY, Disp: 60 tablet, Rfl: 6 .  fenofibrate (TRICOR) 145 MG tablet, TAKE ONE (1) TABLET BY MOUTH EVERY DAY AFTER BREAKFAST, Disp: 30 tablet, Rfl: 6 .  LUMIGAN 0.01 % SOLN, Place 1 drop into both eyes at bedtime. , Disp: , Rfl:  .  Melatonin 10 MG CAPS, Take by mouth., Disp: , Rfl:  .  montelukast (SINGULAIR) 10 MG tablet, Take 10 mg by mouth at bedtime., Disp: , Rfl:  .  ONE TOUCH ULTRA TEST test strip, USE 1 TIME A DAY, Disp: 50 each, Rfl: 12 .  ONETOUCH DELICA LANCETS 67J MISC, Use one lancet to test sugars once daily. E11.9, Disp: 100 each, Rfl: 12 .  sitaGLIPtin (JANUVIA) 100 MG tablet, Take 1 tablet (100  mg total) by mouth daily., Disp: 90 tablet, Rfl: 0  Allergies  Allergen Reactions  . Papain Anaphylaxis    Takes an extreme concentration  . Papaya Derivatives Anaphylaxis  . Mushroom Ext Cmplx(Shiitake-Reishi-Mait) Other (See Comments)    Head congestion and sore throat  . Mushroom Extract Complex Other (See Comments)    Head congestion and sore throat  . Venlafaxine Other (See Comments)    UNSPECIFIED REACTION  Pt states she can take Name brand. Lethargy    Objective:   Temp 97.9 F (36.6 C) (Oral)   Ht 5\' 3"  (1.6 m)   Wt 162 lb (73.5 kg)   BMI 28.70 kg/m   AAOx3, NAD NCAT, EOMI No obvious CN deficits Coloring WNL Pt is able to speak clearly, coherently without shortness of breath or increased work of breathing.  Thought process is linear.  Mood is appropriate.   Assessment and Plan:   Hyperlipidemia- chronic problem.  Tolerating statin w/o difficulty.  Applauded her efforts at diet and exercise.  Check labs.  Adjust meds prn   DM- chronic problem, following w/ Dr Posey Pronto.  Overdue for A1C and microalbumin.  Will check labs and forward to Dr Posey Pronto  Depression/Anxiety- currently stable.  Reviewed pt's AM routine to determine where the mid-morning jitters are coming from- she drinks 4-6 large mugs of coffee each morning.  Told her she cannot use alcohol to counteract caffeine.  Pt is now aware.  Plans to switch to decaf.   Annye Asa, MD 11/19/2018

## 2018-11-20 DIAGNOSIS — R918 Other nonspecific abnormal finding of lung field: Secondary | ICD-10-CM | POA: Diagnosis not present

## 2018-11-20 DIAGNOSIS — J42 Unspecified chronic bronchitis: Secondary | ICD-10-CM | POA: Diagnosis not present

## 2018-11-20 DIAGNOSIS — K219 Gastro-esophageal reflux disease without esophagitis: Secondary | ICD-10-CM | POA: Diagnosis not present

## 2018-11-20 DIAGNOSIS — J4540 Moderate persistent asthma, uncomplicated: Secondary | ICD-10-CM | POA: Diagnosis not present

## 2018-11-21 ENCOUNTER — Other Ambulatory Visit: Payer: Self-pay

## 2018-11-21 ENCOUNTER — Other Ambulatory Visit (INDEPENDENT_AMBULATORY_CARE_PROVIDER_SITE_OTHER): Payer: Medicare Other

## 2018-11-21 DIAGNOSIS — E782 Mixed hyperlipidemia: Secondary | ICD-10-CM

## 2018-11-21 DIAGNOSIS — E119 Type 2 diabetes mellitus without complications: Secondary | ICD-10-CM | POA: Diagnosis not present

## 2018-11-21 LAB — BASIC METABOLIC PANEL
BUN: 17 mg/dL (ref 6–23)
CO2: 25 mEq/L (ref 19–32)
Calcium: 9.9 mg/dL (ref 8.4–10.5)
Chloride: 105 mEq/L (ref 96–112)
Creatinine, Ser: 0.92 mg/dL (ref 0.40–1.20)
GFR: 58.87 mL/min — ABNORMAL LOW (ref >60.00)
Glucose, Bld: 158 mg/dL — ABNORMAL HIGH (ref 70–99)
Potassium: 4.7 mEq/L (ref 3.5–5.1)
Sodium: 139 mEq/L (ref 135–145)

## 2018-11-21 LAB — CBC WITH DIFFERENTIAL/PLATELET
Basophils Absolute: 0.1 10*3/uL (ref 0.0–0.1)
Basophils Relative: 1 % (ref 0.0–3.0)
Eosinophils Absolute: 0.3 10*3/uL (ref 0.0–0.7)
Eosinophils Relative: 4.4 % (ref 0.0–5.0)
HCT: 38.1 % (ref 36.0–46.0)
Hemoglobin: 12.8 g/dL (ref 12.0–15.0)
Lymphocytes Relative: 26.8 % (ref 12.0–46.0)
Lymphs Abs: 1.7 10*3/uL (ref 0.7–4.0)
MCHC: 33.6 g/dL (ref 30.0–36.0)
MCV: 88.6 fl (ref 78.0–100.0)
Monocytes Absolute: 0.4 10*3/uL (ref 0.1–1.0)
Monocytes Relative: 6.8 % (ref 3.0–12.0)
Neutro Abs: 3.9 10*3/uL (ref 1.4–7.7)
Neutrophils Relative %: 61 % (ref 43.0–77.0)
Platelets: 320 10*3/uL (ref 150.0–400.0)
RBC: 4.3 Mil/uL (ref 3.87–5.11)
RDW: 14.5 % (ref 11.5–15.5)
WBC: 6.3 10*3/uL (ref 4.0–10.5)

## 2018-11-21 LAB — LIPID PANEL
Cholesterol: 120 mg/dL (ref 0–200)
HDL: 38 mg/dL — ABNORMAL LOW (ref >39.00)
LDL Cholesterol: 55 mg/dL (ref 0–99)
NonHDL: 81.84
Total CHOL/HDL Ratio: 3
Triglycerides: 134 mg/dL (ref 0.0–149.0)
VLDL: 26.8 mg/dL (ref 0.0–40.0)

## 2018-11-21 LAB — HEPATIC FUNCTION PANEL
ALT: 17 U/L (ref 0–35)
AST: 17 U/L (ref 0–37)
Albumin: 4.3 g/dL (ref 3.5–5.2)
Alkaline Phosphatase: 38 U/L — ABNORMAL LOW (ref 39–117)
Bilirubin, Direct: 0.1 mg/dL (ref 0.0–0.3)
Total Bilirubin: 0.5 mg/dL (ref 0.2–1.2)
Total Protein: 6.6 g/dL (ref 6.0–8.3)

## 2018-11-21 LAB — HEMOGLOBIN A1C: Hgb A1c MFr Bld: 6.8 % — ABNORMAL HIGH (ref 4.6–6.5)

## 2018-11-21 LAB — TSH: TSH: 2.1 u[IU]/mL (ref 0.35–4.50)

## 2018-11-22 ENCOUNTER — Telehealth: Payer: Self-pay | Admitting: Family Medicine

## 2018-11-22 ENCOUNTER — Other Ambulatory Visit: Payer: Medicare Other

## 2018-11-22 LAB — MICROALBUMIN / CREATININE URINE RATIO
Creatinine,U: 60.1 mg/dL
Microalb Creat Ratio: 1.2 mg/g (ref 0.0–30.0)
Microalb, Ur: <0.7 mg/dL (ref 0.0–1.9)

## 2018-11-22 NOTE — Telephone Encounter (Signed)
LM asking pt to call back to schedule a 58mth f/up for Chol.

## 2018-12-06 ENCOUNTER — Other Ambulatory Visit: Payer: Self-pay | Admitting: Family Medicine

## 2018-12-09 ENCOUNTER — Telehealth: Payer: Self-pay | Admitting: General Practice

## 2018-12-09 MED ORDER — FAMOTIDINE 40 MG PO TABS: 40.0000 mg | ORAL_TABLET | Freq: Every day | ORAL | 3 refills | Status: DC

## 2018-12-09 NOTE — Addendum Note (Signed)
Addended by: Davis Gourd on: 12/09/2018 02:04 PM   Modules accepted: Orders

## 2018-12-09 NOTE — Telephone Encounter (Signed)
Received a fax from pt pharmacy that famotidine 20mg  is not currently available and they would like to have PCP change medications. No alternative was given.

## 2018-12-09 NOTE — Telephone Encounter (Signed)
Ok to prescribe Famotidine 40mg  nightly #30, 3 refills

## 2018-12-09 NOTE — Telephone Encounter (Signed)
Medication filled to pharmacy as requested.  Pt made aware of updates to changes.

## 2018-12-11 ENCOUNTER — Other Ambulatory Visit: Payer: Self-pay

## 2018-12-11 ENCOUNTER — Encounter: Payer: Medicare Other | Admitting: Physician Assistant

## 2018-12-11 ENCOUNTER — Ambulatory Visit: Payer: Self-pay | Admitting: *Deleted

## 2018-12-11 NOTE — Telephone Encounter (Signed)
Pt called with symptoms of having a slight sore throat, dry cough, red eye and a runny nose. She denies fever, shortness of breath, headache, body aches or diarrhea. She played golf yesterday and she lives at [employer]. Does not wear a mask in the dinning room but everywhere else. Advised her of having a virtual visit with her provider. Pt voiced understanding. Notified LB at [facility] for an appointment Call transferred to the practice. Triage note routed to the practice. Reason for Disposition . HIGH RISK patient (e.g., age > 37 years, diabetes, heart or lung disease, weak immune system)  Answer Assessment - Initial Assessment Questions 1. COVID-19 DIAGNOSIS: "Who made your Coronavirus (COVID-19) diagnosis?" "Was it confirmed by a positive lab test?" If not diagnosed by a HCP, ask "Are there lots of cases (community spread) where you live?" (See public health department website, if unsure)     In the community 2. ONSET: "When did the COVID-19 symptoms start?"      yesterday 3. WORST SYMPTOM: "What is your worst symptom?" (e.g., cough, fever, shortness of breath, muscle aches)     Red eyes,  4. COUGH: "Do you have a cough?" If so, ask: "How bad is the cough?"       Yes intermittenly 5. FEVER: "Do you have a fever?" If so, ask: "What is your temperature, how was it measured, and when did it start?"     no 6. RESPIRATORY STATUS: "Describe your breathing?" (e.g., shortness of breath, wheezing, unable to speak)      no 7. BETTER-SAME-WORSE: "Are you getting better, staying the same or getting worse compared to yesterday?"  If getting worse, ask, "In what way?"     About the same 8. HIGH RISK DISEASE: "Do you have any chronic medical problems?" (e.g., asthma, heart or lung disease, weak immune system, etc.)    Asthma, diabetes 9. PREGNANCY: "Is there any chance you are pregnant?" "When was your last menstrual period?"     no 10. OTHER SYMPTOMS: "Do you have any other symptoms?"   (e.g., chills, fatigue, headache, loss of smell or taste, muscle pain, sore throat)       Runny nose, sl sore throat, red eyes  Protocols used: CORONAVIRUS (COVID-19) DIAGNOSED OR SUSPECTED-A-AH

## 2018-12-11 NOTE — Progress Notes (Deleted)
Virtual Visit via Video   I connected with patient on 12/11/18 at  2:20 PM EDT by a video enabled telemedicine application and verified that I am speaking with the correct person using two identifiers.  Location patient: Home Location provider: [facility] Office Persons participating in the virtual visit: Patient, Provider, CMA (***)  I discussed the limitations of evaluation and management by telemedicine and the availability of in person appointments. The patient expressed understanding and agreed to proceed.  Subjective:   HPI:   ***  ROS:   See pertinent positives and negatives per HPI.  Patient Active Problem List   Diagnosis Date Noted  . Diarrhea 12/27/2017  . Chronic diarrhea 12/26/2017  . S/P shoulder replacement, right 09/15/2016  . Benign paroxysmal positional vertigo 10/14/2014  . Back pain 08/27/2014  . Physical exam 08/27/2014  . Decreased hearing of both ears 05/19/2014  . Other malaise and fatigue 01/28/2014  . S/P cholecystectomy 10/02/2013  . Asthma, moderate persistent 05/16/2013  . H/O recurrent pneumonia 05/16/2013  . Insomnia 05/05/2013  . Gluten intolerance 05/06/2012  . Rash and nonspecific skin eruption 02/07/2012  . Renal calculus, left 01/29/2012  . Painful bladder spasm 12/14/2011  . Lactose intolerance 12/14/2011  . Post-menopausal 12/14/2011  . Microscopic colitis 11/08/2011  . Hepatic steatosis 08/25/2011  . Pulmonary nodule, right 08/25/2011  . Hematuria 08/23/2011  . Palpitations 12/26/2010  . Memory loss 12/12/2010  . Other screening mammogram 12/12/2010  . Diabetes mellitus type II, controlled, with no complications ([place]) 54/65/0354  . Hyperlipidemia 07/14/2010  . Depression with anxiety 07/14/2010  . GLAUCOMA 07/14/2010  . ALLERGIC RHINITIS 07/14/2010    Social History   Tobacco Use  . Smoking status: Former Smoker    Packs/day: 0.10    Years: 4.00    Pack years: 0.40    Types: Cigarettes    Quit date:  06/19/1986    Years since quitting: 32.5  . Smokeless tobacco: Never Used  . Tobacco comment: smoked in college years ago  Substance Use Topics  . Alcohol use: Yes    Comment: 1-2 glasses week variety beer occ scotch    Current Outpatient Medications:  .  atorvastatin (LIPITOR) 20 MG tablet, TAKE ONE (1) TABLET BY MOUTH EACH DAY, Disp: 90 tablet, Rfl: 1 .  Biotin 5000 MCG CAPS, Take 1 capsule by mouth daily., Disp: , Rfl:  .  cetirizine (ZYRTEC) 10 MG tablet, Take by mouth., Disp: , Rfl:  .  citalopram (CELEXA) 40 MG tablet, TAKE ONE (1) TABLET BY MOUTH EVERY DAY, Disp: 30 tablet, Rfl: 6 .  famotidine (PEPCID) 40 MG tablet, Take 1 tablet (40 mg total) by mouth at bedtime., Disp: 30 tablet, Rfl: 3 .  fenofibrate (TRICOR) 145 MG tablet, TAKE ONE (1) TABLET BY MOUTH EVERY DAY AFTER BREAKFAST, Disp: 30 tablet, Rfl: 6 .  LUMIGAN 0.01 % SOLN, Place 1 drop into both eyes at bedtime. , Disp: , Rfl:  .  Melatonin 10 MG CAPS, Take by mouth., Disp: , Rfl:  .  montelukast (SINGULAIR) 10 MG tablet, Take 10 mg by mouth at bedtime., Disp: , Rfl:  .  ONE TOUCH ULTRA TEST test strip, USE 1 TIME A DAY, Disp: 50 each, Rfl: 12 .  ONETOUCH DELICA LANCETS 65K MISC, Use one lancet to test sugars once daily. E11.9, Disp: 100 each, Rfl: 12 .  sitaGLIPtin (JANUVIA) 100 MG tablet, Take 1 tablet (100 mg total) by mouth daily., Disp: 90 tablet, Rfl: 0  Allergies  Allergen  Reactions  . Papain Anaphylaxis    Takes an extreme concentration  . Papaya Derivatives Anaphylaxis  . Mushroom Ext Cmplx(Shiitake-Reishi-Mait) Other (See Comments)    Head congestion and sore throat  . Mushroom Extract Complex Other (See Comments)    Head congestion and sore throat  . Venlafaxine Other (See Comments)    UNSPECIFIED REACTION  Pt states she can take Name brand. Lethargy    Objective:   There were no vitals taken for this visit.  Patient is well-developed, well-nourished in no acute distress.  Resting comfortably *** at  home.  Head is normocephalic, atraumatic.  No labored breathing.  Speech is clear and coherent with logical contest.  Patient is alert and oriented at baseline.  ***  Assessment and Plan:        ***.   Leeanne Rio, PA-C 12/11/2018  Time spent with the patient: *** minutes, of which >50% was spent in obtaining information about symptoms, reviewing previous labs, evaluations, and treatments, counseling about condition (please see the discussed topics above), and developing a plan to further investigate it; had a number of questions which I addressed.

## 2018-12-11 NOTE — Progress Notes (Signed)
Erroneous encounter. Please disregard.

## 2018-12-28 DIAGNOSIS — Z1231 Encounter for screening mammogram for malignant neoplasm of breast: Secondary | ICD-10-CM | POA: Diagnosis not present

## 2018-12-28 LAB — HM MAMMOGRAPHY

## 2019-01-07 DIAGNOSIS — H401131 Primary open-angle glaucoma, bilateral, mild stage: Secondary | ICD-10-CM | POA: Diagnosis not present

## 2019-01-07 DIAGNOSIS — Z961 Presence of intraocular lens: Secondary | ICD-10-CM | POA: Diagnosis not present

## 2019-01-07 DIAGNOSIS — E119 Type 2 diabetes mellitus without complications: Secondary | ICD-10-CM | POA: Diagnosis not present

## 2019-01-09 ENCOUNTER — Encounter: Payer: Self-pay | Admitting: General Practice

## 2019-02-03 ENCOUNTER — Other Ambulatory Visit: Payer: Self-pay

## 2019-02-03 ENCOUNTER — Encounter: Payer: Self-pay | Admitting: Physician Assistant

## 2019-02-03 ENCOUNTER — Ambulatory Visit (INDEPENDENT_AMBULATORY_CARE_PROVIDER_SITE_OTHER): Payer: Medicare Other | Admitting: Physician Assistant

## 2019-02-03 VITALS — BP 138/70 | HR 70 | Temp 97.9°F | Ht 63.0 in | Wt 164.0 lb

## 2019-02-03 DIAGNOSIS — Z7289 Other problems related to lifestyle: Secondary | ICD-10-CM | POA: Diagnosis not present

## 2019-02-03 DIAGNOSIS — M545 Low back pain: Secondary | ICD-10-CM

## 2019-02-03 DIAGNOSIS — R102 Pelvic and perineal pain: Secondary | ICD-10-CM

## 2019-02-03 DIAGNOSIS — G8929 Other chronic pain: Secondary | ICD-10-CM | POA: Diagnosis not present

## 2019-02-03 DIAGNOSIS — Z789 Other specified health status: Secondary | ICD-10-CM

## 2019-02-03 LAB — POCT URINALYSIS DIPSTICK
Bilirubin, UA: NEGATIVE
Blood, UA: NEGATIVE
Glucose, UA: NEGATIVE
Ketones, UA: NEGATIVE
Leukocytes, UA: NEGATIVE
Nitrite, UA: NEGATIVE
Protein, UA: NEGATIVE
Spec Grav, UA: >=1.03 — AB (ref 1.010–1.025)
Urobilinogen, UA: 0.2 E.U./dL
pH, UA: 5.5 (ref 5.0–8.0)

## 2019-02-03 LAB — CBC WITH DIFFERENTIAL/PLATELET
Basophils Absolute: 0.1 10*3/uL (ref 0.0–0.1)
Basophils Relative: 0.9 % (ref 0.0–3.0)
Eosinophils Absolute: 0.3 10*3/uL (ref 0.0–0.7)
Eosinophils Relative: 4.3 % (ref 0.0–5.0)
HCT: 37.9 % (ref 36.0–46.0)
Hemoglobin: 12.6 g/dL (ref 12.0–15.0)
Lymphocytes Relative: 24.9 % (ref 12.0–46.0)
Lymphs Abs: 1.7 10*3/uL (ref 0.7–4.0)
MCHC: 33.2 g/dL (ref 30.0–36.0)
MCV: 89.4 fl (ref 78.0–100.0)
Monocytes Absolute: 0.5 10*3/uL (ref 0.1–1.0)
Monocytes Relative: 7.6 % (ref 3.0–12.0)
Neutro Abs: 4.3 10*3/uL (ref 1.4–7.7)
Neutrophils Relative %: 62.3 % (ref 43.0–77.0)
Platelets: 291 10*3/uL (ref 150.0–400.0)
RBC: 4.24 Mil/uL (ref 3.87–5.11)
RDW: 13.9 % (ref 11.5–15.5)
WBC: 6.9 10*3/uL (ref 4.0–10.5)

## 2019-02-03 LAB — COMPREHENSIVE METABOLIC PANEL
ALT: 15 U/L (ref 0–35)
AST: 18 U/L (ref 0–37)
Albumin: 4.6 g/dL (ref 3.5–5.2)
Alkaline Phosphatase: 40 U/L (ref 39–117)
BUN: 20 mg/dL (ref 6–23)
CO2: 21 mEq/L (ref 19–32)
Calcium: 9.8 mg/dL (ref 8.4–10.5)
Chloride: 105 mEq/L (ref 96–112)
Creatinine, Ser: 0.82 mg/dL (ref 0.40–1.20)
GFR: 67.2 mL/min (ref >60.00)
Glucose, Bld: 104 mg/dL — ABNORMAL HIGH (ref 70–99)
Potassium: 3.9 mEq/L (ref 3.5–5.1)
Sodium: 137 mEq/L (ref 135–145)
Total Bilirubin: 0.3 mg/dL (ref 0.2–1.2)
Total Protein: 6.9 g/dL (ref 6.0–8.3)

## 2019-02-03 NOTE — Progress Notes (Signed)
Patient presents to clinic today c/o intermittent pain in her lower back bilaterally, worse after laying for prolonged periods of time. Notes that when she wakes up in the night to go to the bathroom the pain is significant, having a hard time getting out of bed. Notes after walking to the restroom and using the bathroom it is easier to move around and the pain dissipates. Does note some increase in urinary frequency over the past few months but without dysuria, urinary urgency or hesitancy. Does note an increase in alcohol consumption recently after loss of her husband. Few shots per day of alcohol, especially when anxious. Notes occasionalfFeelings of loneliness and anxiety. Tries to surround herself with friends which helps. Is taking her Citalopram once in the morning. Does not sleep without sleeping aid -- been the case for 5+ years. Takes Melatonin. 1 Unisom at night. Keeping active currently.    Past Medical History:  Diagnosis Date  . Allergic rhinitis   . Asthma   . Diabetes mellitus    just changed from injection to metformin ? month  . Dysrhythmia    palpitations  . Glaucoma   . History of kidney stones   . Hyperlipemia   . Migraines   . Palpitations   . Pneumonia    hx  . Urinary tract infection     Current Outpatient Medications on File Prior to Visit  Medication Sig Dispense Refill  . atorvastatin (LIPITOR) 20 MG tablet TAKE ONE (1) TABLET BY MOUTH EACH DAY 90 tablet 1  . Biotin 5000 MCG CAPS Take 1 capsule by mouth daily.    . cetirizine (ZYRTEC) 10 MG tablet Take by mouth.    . citalopram (CELEXA) 40 MG tablet TAKE ONE (1) TABLET BY MOUTH EVERY DAY 30 tablet 6  . famotidine (PEPCID) 40 MG tablet Take 1 tablet (40 mg total) by mouth at bedtime. (Patient taking differently: Take 20 mg by mouth 2 (two) times daily. ) 30 tablet 3  . fenofibrate (TRICOR) 145 MG tablet TAKE ONE (1) TABLET BY MOUTH EVERY DAY AFTER BREAKFAST 30 tablet 6  . LUMIGAN 0.01 % SOLN Place 1 drop  into both eyes at bedtime.     . Melatonin 10 MG CAPS Take by mouth.    . montelukast (SINGULAIR) 10 MG tablet Take 10 mg by mouth at bedtime.    . ONE TOUCH ULTRA TEST test strip USE 1 TIME A DAY 50 each 12  . ONETOUCH DELICA LANCETS 88P MISC Use one lancet to test sugars once daily. E11.9 100 each 12  . sitaGLIPtin (JANUVIA) 100 MG tablet Take 1 tablet (100 mg total) by mouth daily. 90 tablet 0   No current facility-administered medications on file prior to visit.     Allergies  Allergen Reactions  . Papain Anaphylaxis    Takes an extreme concentration  . Papaya Derivatives Anaphylaxis  . Mushroom Ext Cmplx(Shiitake-Reishi-Mait) Other (See Comments)    Head congestion and sore throat  . Mushroom Extract Complex Other (See Comments)    Head congestion and sore throat  . Venlafaxine Other (See Comments)    UNSPECIFIED REACTION  Pt states she can take Name brand. Lethargy    Family History  Problem Relation Age of Onset  . Diabetes Father   . Colon cancer Neg Hx   . Esophageal cancer Neg Hx   . Rectal cancer Neg Hx   . Stomach cancer Neg Hx   . Breast cancer Neg Hx  Social History   Socioeconomic History  . Marital status: Widowed    Spouse name: Not on file  . Number of children: 2  . Years of education: Not on file  . Highest education level: Not on file  Occupational History  . Occupation: Retired -Sports coach: RETIRED  Social Needs  . Financial resource strain: Not on file  . Food insecurity    Worry: Not on file    Inability: Not on file  . Transportation needs    Medical: Not on file    Non-medical: Not on file  Tobacco Use  . Smoking status: Former Smoker    Packs/day: 0.10    Years: 4.00    Pack years: 0.40    Types: Cigarettes    Quit date: 06/19/1986    Years since quitting: 32.6  . Smokeless tobacco: Never Used  . Tobacco comment: smoked in college years ago  Substance and Sexual Activity  . Alcohol use: Yes    Comment: 1-2  glasses week variety beer occ scotch  . Drug use: No  . Sexual activity: Not on file  Lifestyle  . Physical activity    Days per week: Not on file    Minutes per session: Not on file  . Stress: Not on file  Relationships  . Social Herbalist on phone: Not on file    Gets together: Not on file    Attends religious service: Not on file    Active member of club or organization: Not on file    Attends meetings of clubs or organizations: Not on file    Relationship status: Not on file  Other Topics Concern  . Not on file  Social History Narrative   Daily caffeine    Review of Systems - See HPI.  All other ROS are negative.  BP 138/70   Pulse 70   Temp 97.9 F (36.6 C) (Skin)   Ht '5\' 3"'  (1.6 m)   Wt 164 lb (74.4 kg)   SpO2 98%   BMI 29.05 kg/m   Physical Exam Vitals signs reviewed.  Constitutional:      Appearance: Normal appearance.  HENT:     Head: Normocephalic and atraumatic.  Neck:     Musculoskeletal: Neck supple.  Cardiovascular:     Rate and Rhythm: Normal rate and regular rhythm.     Pulses: Normal pulses.     Heart sounds: Normal heart sounds.  Pulmonary:     Effort: Pulmonary effort is normal.  Abdominal:     Tenderness: There is no abdominal tenderness. There is no right CVA tenderness or left CVA tenderness.  Musculoskeletal:     Lumbar back: She exhibits tenderness (pain in lumbar perispinal musculature with palpation and with lateral flexion,/rotation of torso). She exhibits normal range of motion, no bony tenderness, no spasm and normal pulse.  Neurological:     General: No focal deficit present.     Mental Status: She is alert and oriented to person, place, and time.    Recent Results (from the past 2160 hour(s))  Urine Microalbumin w/creat. ratio     Status: None   Collection Time: 11/21/18  1:21 PM  Result Value Ref Range   Microalb, Ur <0.7 0.0 - 1.9 mg/dL   Creatinine,U 60.1 mg/dL   Microalb Creat Ratio 1.2 0.0 - 30.0 mg/g  CBC  with Differential/Platelet     Status: None   Collection Time: 11/21/18  1:21 PM  Result Value Ref Range   WBC 6.3 4.0 - 10.5 K/uL   RBC 4.30 3.87 - 5.11 Mil/uL   Hemoglobin 12.8 12.0 - 15.0 g/dL   HCT 38.1 36.0 - 46.0 %   MCV 88.6 78.0 - 100.0 fl   MCHC 33.6 30.0 - 36.0 g/dL   RDW 14.5 11.5 - 15.5 %   Platelets 320.0 150.0 - 400.0 K/uL   Neutrophils Relative % 61.0 43.0 - 77.0 %   Lymphocytes Relative 26.8 12.0 - 46.0 %   Monocytes Relative 6.8 3.0 - 12.0 %   Eosinophils Relative 4.4 0.0 - 5.0 %   Basophils Relative 1.0 0.0 - 3.0 %   Neutro Abs 3.9 1.4 - 7.7 K/uL   Lymphs Abs 1.7 0.7 - 4.0 K/uL   Monocytes Absolute 0.4 0.1 - 1.0 K/uL   Eosinophils Absolute 0.3 0.0 - 0.7 K/uL   Basophils Absolute 0.1 0.0 - 0.1 K/uL  Hepatic function panel     Status: Abnormal   Collection Time: 11/21/18  1:21 PM  Result Value Ref Range   Total Bilirubin 0.5 0.2 - 1.2 mg/dL   Bilirubin, Direct 0.1 0.0 - 0.3 mg/dL   Alkaline Phosphatase 38 (L) 39 - 117 U/L   AST 17 0 - 37 U/L   ALT 17 0 - 35 U/L   Total Protein 6.6 6.0 - 8.3 g/dL   Albumin 4.3 3.5 - 5.2 g/dL  TSH     Status: None   Collection Time: 11/21/18  1:21 PM  Result Value Ref Range   TSH 2.10 0.35 - 4.50 uIU/mL  Basic metabolic panel     Status: Abnormal   Collection Time: 11/21/18  1:21 PM  Result Value Ref Range   Sodium 139 135 - 145 mEq/L   Potassium 4.7 3.5 - 5.1 mEq/L   Chloride 105 96 - 112 mEq/L   CO2 25 19 - 32 mEq/L   Glucose, Bld 158 (H) 70 - 99 mg/dL   BUN 17 6 - 23 mg/dL   Creatinine, Ser 0.92 0.40 - 1.20 mg/dL   Calcium 9.9 8.4 - 10.5 mg/dL   GFR 58.87 (L) >60.00 mL/min  Lipid panel     Status: Abnormal   Collection Time: 11/21/18  1:21 PM  Result Value Ref Range   Cholesterol 120 0 - 200 mg/dL    Comment: ATP III Classification       Desirable:  < 200 mg/dL               Borderline High:  200 - 239 mg/dL          High:  > = 240 mg/dL   Triglycerides 134.0 0.0 - 149.0 mg/dL    Comment: Normal:  <150  mg/dLBorderline High:  150 - 199 mg/dL   HDL 38.00 (L) >39.00 mg/dL   VLDL 26.8 0.0 - 40.0 mg/dL   LDL Cholesterol 55 0 - 99 mg/dL   Total CHOL/HDL Ratio 3     Comment:                Men          Women1/2 Average Risk     3.4          3.3Average Risk          5.0          4.42X Average Risk          9.6          7.13X Average Risk  15.0          11.0                       NonHDL 81.84     Comment: NOTE:  Non-HDL goal should be 30 mg/dL higher than patient's LDL goal (i.e. LDL goal of < 70 mg/dL, would have non-HDL goal of < 100 mg/dL)  Hemoglobin A1c     Status: Abnormal   Collection Time: 11/21/18  1:21 PM  Result Value Ref Range   Hgb A1c MFr Bld 6.8 (H) 4.6 - 6.5 %    Comment: Glycemic Control Guidelines for People with Diabetes:Non Diabetic:  <6%Goal of Therapy: <7%Additional Action Suggested:  >8%   HM MAMMOGRAPHY     Status: None   Collection Time: 12/28/18 12:00 AM  Result Value Ref Range   HM Mammogram 0-4 Bi-Rad 0-4 Bi-Rad, Self Reported Normal    Comment: bi-rads 1   Assessment/Plan: 1. Pelvic pain 2. Chronic bilateral low back pain without sciatica Her exam unremarkable except for pain in lower back bilaterally with lateral flexion and rotation. No CVA tenderness. UA negative. Culture sent. Will check labs today as she is convinced symptoms are stemming from her kidneys and urinary tract. Discussed likely MSK component giving history and exam today. She again is adamant this is not MSK as she was "very active when she was younger". Discussed it could be as simple as her mattress needing rotated, etc. She wishes to wait for lab results before proceeding with any treatment.  - CBC w/Diff - Comp Met (CMET) - Urine Culture  3. Alcohol use Starting to become problematic as she is drinking to escape. She does not seem to feel there is an issue. It has been recommended she follow-up with her PCP to discuss her alcohol use, especially giving the prescription and OTC  medications she is taking.    Leeanne Rio, PA-C

## 2019-02-03 NOTE — Patient Instructions (Signed)
Please go to the lab today for blood work.  I will call you with your results.   Avoid any heavy lifting or overexertion. Try to reduce alcohol intake. Topical Icy hot or Aspercreme to the lower back.  We will alter regimen and proceed with next steps once I have results back.  Hang in there!

## 2019-02-04 LAB — URINE CULTURE
MICRO NUMBER:: 779803
Result:: NO GROWTH
SPECIMEN QUALITY:: ADEQUATE

## 2019-02-05 ENCOUNTER — Other Ambulatory Visit: Payer: Self-pay

## 2019-02-05 ENCOUNTER — Encounter: Payer: Self-pay | Admitting: Family Medicine

## 2019-02-05 DIAGNOSIS — M545 Low back pain, unspecified: Secondary | ICD-10-CM

## 2019-02-05 DIAGNOSIS — R351 Nocturia: Secondary | ICD-10-CM

## 2019-02-05 DIAGNOSIS — R102 Pelvic and perineal pain: Secondary | ICD-10-CM

## 2019-02-07 ENCOUNTER — Other Ambulatory Visit: Payer: Self-pay | Admitting: Family Medicine

## 2019-02-11 DIAGNOSIS — E041 Nontoxic single thyroid nodule: Secondary | ICD-10-CM | POA: Diagnosis not present

## 2019-02-13 DIAGNOSIS — Z87442 Personal history of urinary calculi: Secondary | ICD-10-CM | POA: Diagnosis not present

## 2019-02-13 DIAGNOSIS — M545 Low back pain: Secondary | ICD-10-CM | POA: Diagnosis not present

## 2019-02-13 DIAGNOSIS — R351 Nocturia: Secondary | ICD-10-CM | POA: Diagnosis not present

## 2019-02-17 DIAGNOSIS — N200 Calculus of kidney: Secondary | ICD-10-CM | POA: Diagnosis not present

## 2019-02-18 ENCOUNTER — Telehealth: Payer: Self-pay | Admitting: Family Medicine

## 2019-02-18 DIAGNOSIS — M5136 Other intervertebral disc degeneration, lumbar region: Secondary | ICD-10-CM

## 2019-02-18 DIAGNOSIS — M5126 Other intervertebral disc displacement, lumbar region: Secondary | ICD-10-CM

## 2019-02-18 NOTE — Telephone Encounter (Signed)
Fax sent today to request the CT results and last OV note.

## 2019-02-18 NOTE — Telephone Encounter (Signed)
I would need a copy of the CT report to know what I'm referring for.

## 2019-02-18 NOTE — Telephone Encounter (Signed)
Please advise, have you received anything about this?

## 2019-02-18 NOTE — Telephone Encounter (Signed)
Pt called in stating that she had a CT done ordered by  Dr.Ottelin's office, they told pt that she needs to go see a orthopedic or a neurosurgereon. She wanted to know if Dr. Birdie Riddle would put in a referral for her. Please advise

## 2019-02-19 NOTE — Telephone Encounter (Signed)
Referral placed. Called and informed pt.  

## 2019-02-19 NOTE — Telephone Encounter (Signed)
[place] for referral to Neurosurgeon dx- L3/L4 degenerative disc disease w/ disc bulging

## 2019-02-19 NOTE — Telephone Encounter (Signed)
I have placed the records in the bin upfront

## 2019-02-19 NOTE — Addendum Note (Signed)
Addended by: Davis Gourd on: 02/19/2019 08:20 AM   Modules accepted: Orders

## 2019-02-19 NOTE — Telephone Encounter (Signed)
Paperwork given to PCP for review.  

## 2019-02-21 DIAGNOSIS — Z23 Encounter for immunization: Secondary | ICD-10-CM | POA: Diagnosis not present

## 2019-02-26 DIAGNOSIS — M25512 Pain in left shoulder: Secondary | ICD-10-CM | POA: Diagnosis not present

## 2019-02-27 DIAGNOSIS — M545 Low back pain: Secondary | ICD-10-CM | POA: Diagnosis not present

## 2019-02-27 DIAGNOSIS — Z6829 Body mass index (BMI) 29.0-29.9, adult: Secondary | ICD-10-CM | POA: Diagnosis not present

## 2019-02-28 ENCOUNTER — Telehealth: Payer: Self-pay | Admitting: Family Medicine

## 2019-02-28 NOTE — Telephone Encounter (Signed)
The neurosurgeon notes are in your folder for review.

## 2019-02-28 NOTE — Telephone Encounter (Signed)
Pt called in stating that she saw the Neurosurgereon yesterday, they told her that the back pain is not uncommon for people of her age. She wanted to know what her next steps should be because she is still having the back pain and having to get up in the middle of the night to void. Pt also wanted to know what Dr. Virgil Benedict thoughts were of her getting tested for Hep-C. Pt can be reached at the home #

## 2019-03-03 DIAGNOSIS — R351 Nocturia: Secondary | ICD-10-CM | POA: Diagnosis not present

## 2019-03-03 DIAGNOSIS — M549 Dorsalgia, unspecified: Secondary | ICD-10-CM | POA: Diagnosis not present

## 2019-03-05 NOTE — Telephone Encounter (Signed)
We can do a Hep C test at any upcoming visit.  I suspect this has been done as she has had a work up for fatty liver but I was not able to find the result  The notes from the neurosurgeon indicated mild degenerative (arthritic) change.  We can certainly refer to Sports Medicine or PT if pt is interested as this will improve her pain and mobility

## 2019-03-05 NOTE — Telephone Encounter (Signed)
Pt informed and she would like to hold off on a referral at this time. She is going to start working back on core and exercises on her own first.

## 2019-03-06 DIAGNOSIS — E782 Mixed hyperlipidemia: Secondary | ICD-10-CM | POA: Diagnosis not present

## 2019-03-06 DIAGNOSIS — E041 Nontoxic single thyroid nodule: Secondary | ICD-10-CM | POA: Diagnosis not present

## 2019-03-06 DIAGNOSIS — E119 Type 2 diabetes mellitus without complications: Secondary | ICD-10-CM | POA: Diagnosis not present

## 2019-04-07 ENCOUNTER — Other Ambulatory Visit: Payer: Self-pay | Admitting: Family Medicine

## 2019-04-13 ENCOUNTER — Encounter: Payer: Self-pay | Admitting: Family Medicine

## 2019-05-06 ENCOUNTER — Other Ambulatory Visit: Payer: Self-pay | Admitting: Family Medicine

## 2019-05-19 ENCOUNTER — Telehealth: Payer: Self-pay | Admitting: Family Medicine

## 2019-05-19 NOTE — Telephone Encounter (Signed)
disreagrd

## 2019-05-20 ENCOUNTER — Ambulatory Visit (INDEPENDENT_AMBULATORY_CARE_PROVIDER_SITE_OTHER): Payer: Medicare Other | Admitting: Family Medicine

## 2019-05-20 ENCOUNTER — Other Ambulatory Visit: Payer: Self-pay

## 2019-05-20 ENCOUNTER — Encounter: Payer: Self-pay | Admitting: Family Medicine

## 2019-05-20 VITALS — BP 136/87 | HR 89 | Temp 97.9°F | Resp 16 | Ht 63.25 in | Wt 163.1 lb

## 2019-05-20 DIAGNOSIS — K529 Noninfective gastroenteritis and colitis, unspecified: Secondary | ICD-10-CM | POA: Diagnosis not present

## 2019-05-20 DIAGNOSIS — E119 Type 2 diabetes mellitus without complications: Secondary | ICD-10-CM

## 2019-05-20 DIAGNOSIS — Z0000 Encounter for general adult medical examination without abnormal findings: Secondary | ICD-10-CM | POA: Diagnosis not present

## 2019-05-20 DIAGNOSIS — E782 Mixed hyperlipidemia: Secondary | ICD-10-CM | POA: Diagnosis not present

## 2019-05-20 LAB — HEPATIC FUNCTION PANEL
ALT: 21 U/L (ref 0–35)
AST: 20 U/L (ref 0–37)
Albumin: 4.6 g/dL (ref 3.5–5.2)
Alkaline Phosphatase: 45 U/L (ref 39–117)
Bilirubin, Direct: 0.1 mg/dL (ref 0.0–0.3)
Total Bilirubin: 0.4 mg/dL (ref 0.2–1.2)
Total Protein: 6.9 g/dL (ref 6.0–8.3)

## 2019-05-20 LAB — BASIC METABOLIC PANEL
BUN: 21 mg/dL (ref 6–23)
CO2: 23 mEq/L (ref 19–32)
Calcium: 10.3 mg/dL (ref 8.4–10.5)
Chloride: 106 mEq/L (ref 96–112)
Creatinine, Ser: 0.79 mg/dL (ref 0.40–1.20)
GFR: 70.1 mL/min (ref >60.00)
Glucose, Bld: 118 mg/dL — ABNORMAL HIGH (ref 70–99)
Potassium: 4.4 mEq/L (ref 3.5–5.1)
Sodium: 138 mEq/L (ref 135–145)

## 2019-05-20 LAB — CBC WITH DIFFERENTIAL/PLATELET
Basophils Absolute: 0.1 10*3/uL (ref 0.0–0.1)
Basophils Relative: 0.9 % (ref 0.0–3.0)
Eosinophils Absolute: 0.3 10*3/uL (ref 0.0–0.7)
Eosinophils Relative: 4 % (ref 0.0–5.0)
HCT: 38.4 % (ref 36.0–46.0)
Hemoglobin: 12.8 g/dL (ref 12.0–15.0)
Lymphocytes Relative: 22.3 % (ref 12.0–46.0)
Lymphs Abs: 1.6 10*3/uL (ref 0.7–4.0)
MCHC: 33.5 g/dL (ref 30.0–36.0)
MCV: 88.8 fl (ref 78.0–100.0)
Monocytes Absolute: 0.6 10*3/uL (ref 0.1–1.0)
Monocytes Relative: 8.2 % (ref 3.0–12.0)
Neutro Abs: 4.8 10*3/uL (ref 1.4–7.7)
Neutrophils Relative %: 64.6 % (ref 43.0–77.0)
Platelets: 302 10*3/uL (ref 150.0–400.0)
RBC: 4.32 Mil/uL (ref 3.87–5.11)
RDW: 13.5 % (ref 11.5–15.5)
WBC: 7.4 10*3/uL (ref 4.0–10.5)

## 2019-05-20 LAB — LIPID PANEL
Cholesterol: 121 mg/dL (ref 0–200)
HDL: 35.4 mg/dL — ABNORMAL LOW (ref >39.00)
LDL Cholesterol: 57 mg/dL (ref 0–99)
NonHDL: 86.02
Total CHOL/HDL Ratio: 3
Triglycerides: 144 mg/dL (ref 0.0–149.0)
VLDL: 28.8 mg/dL (ref 0.0–40.0)

## 2019-05-20 LAB — TSH: TSH: 2.14 u[IU]/mL (ref 0.35–4.50)

## 2019-05-20 NOTE — Progress Notes (Signed)
Subjective:    Patient ID: [name], female    DOB: 1940-09-18, 78 y.o.   MRN: [id number]  HPI Here today for MWV.  Risk Factors: Hyperlipidemia- chronic problem, on Lipitor 20mg  daily and Fenofibrate 145mg  daily.  No CP, SOB, HAs, visual changes, abd pain, N/V. DM- chronic problem, following w/ Dr Posey Pronto.  On Januvia daily.  Last visit September 17th.  UTD on eye exam, microalbumin.  Due for foot exam.  Denies numbness/tingling of hands/feet.  No sores on feet. Chronic diarrhea- ongoing issue for pt.  Worsening recently.  Has not been to GI recently.  Drinking 6 cups of coffee daily Physical Activity: walking for an hr 3x/week Fall Risk: low risk Depression: denies current sxs, on Citalopram Hearing: wears hearing aides ADL's: independent Cognitive: normal linear thought process, memory and attention intact Home Safety: safe at home, lives at [employer], Weight, BMI, Visual Acuity: see vitals, vision corrected to 20/20 w/ glasses Counseling: UTD on mammo, DEXAm immunizations, colonoscopy.  Due for foot exam. Labs Ordered: See A&P Care Plan: See A&P   Patient Care Team    Relationship Specialty Notifications Start End  Midge Minium, MD PCP - General Family Medicine  12/08/10    Comment: Alice Rieger, AUD  Audiology  08/25/14   Netta Cedars, MD Consulting Physician Orthopedic Surgery  08/15/17   Hollar, Katharine Look, MD Referring Physician Dermatology  08/15/17   Milus Banister, MD Attending Physician Gastroenterology  11/23/17   Charlaine Dalton, MD Referring Physician Pulmonary Disease  10/22/18   Calvert Cantor, MD Consulting Physician Ophthalmology  10/22/18   Joycie Peek, MD  General Practice  10/22/18    Comment: Dentist  Amalia Greenhouse, MD Referring Physician Endocrinology  11/22/18   Kathie Rhodes, MD Consulting Physician Urology  02/18/19       Review of Systems For ROS see HPI   This visit occurred during the SARS-CoV-2  public health emergency.  Safety protocols were in place, including screening questions prior to the visit, additional usage of staff PPE, and extensive cleaning of exam room while observing appropriate contact time as indicated for disinfecting solutions.       Objective:   Physical Exam Vitals signs reviewed.  Constitutional:      General: She is not in acute distress.    Appearance: She is well-developed.  HENT:     Head: Normocephalic and atraumatic.  Eyes:     Conjunctiva/sclera: Conjunctivae normal.     Pupils: Pupils are equal, round, and reactive to light.  Neck:     Musculoskeletal: Normal range of motion and neck supple.     Thyroid: No thyromegaly.     Comments: Soft tissue mass at sternal notch Cardiovascular:     Rate and Rhythm: Normal rate and regular rhythm.     Heart sounds: Normal heart sounds. No murmur.  Pulmonary:     Effort: Pulmonary effort is normal. No respiratory distress.     Breath sounds: Normal breath sounds.  Abdominal:     General: There is no distension.     Palpations: Abdomen is soft.     Tenderness: There is no abdominal tenderness.  Lymphadenopathy:     Cervical: No cervical adenopathy.  Skin:    General: Skin is warm and dry.  Neurological:     Mental Status: She is alert and oriented to person, place, and time.  Psychiatric:        Behavior: Behavior normal.  Assessment & Plan:

## 2019-05-20 NOTE — Patient Instructions (Addendum)
Follow up in 6 months to recheck cholesterol We'll notify you of your lab results and make any changes if needed We'll call you with your GI appt for the diarrhea START a daily multivitamin like Centrum Silver DECREASE your coffee intake to see if your stools slow down FOLLOW UP with Dr Ilda Foil (ENT) for the neck nodule Call with any questions or concerns Stay Safe!  Stay Healthy!   Preventive Care 79 Years and Older, Female Preventive care refers to lifestyle choices and visits with your health care provider that can promote health and wellness. This includes:  A yearly physical exam. This is also called an annual well check.  Regular dental and eye exams.  Immunizations.  Screening for certain conditions.  Healthy lifestyle choices, such as diet and exercise. What can I expect for my preventive care visit? Physical exam Your health care provider will check:  Height and weight. These may be used to calculate body mass index (BMI), which is a measurement that tells if you are at a healthy weight.  Heart rate and blood pressure.  Your skin for abnormal spots. Counseling Your health care provider may ask you questions about:  Alcohol, tobacco, and drug use.  Emotional well-being.  Home and relationship well-being.  Sexual activity.  Eating habits.  History of falls.  Memory and ability to understand (cognition).  Work and work Statistician.  Pregnancy and menstrual history. What immunizations do I need?  Influenza (flu) vaccine  This is recommended every year. Tetanus, diphtheria, and pertussis (Tdap) vaccine  You may need a Td booster every 10 years. Varicella (chickenpox) vaccine  You may need this vaccine if you have not already been vaccinated. Zoster (shingles) vaccine  You may need this after age 79. Pneumococcal conjugate (PCV13) vaccine  One dose is recommended after age 79. Pneumococcal polysaccharide (PPSV23) vaccine  One dose is  recommended after age 79. Measles, mumps, and rubella (MMR) vaccine  You may need at least one dose of MMR if you were born in 1957 or later. You may also need a second dose. Meningococcal conjugate (MenACWY) vaccine  You may need this if you have certain conditions. Hepatitis A vaccine  You may need this if you have certain conditions or if you travel or work in places where you may be exposed to hepatitis A. Hepatitis B vaccine  You may need this if you have certain conditions or if you travel or work in places where you may be exposed to hepatitis B. Haemophilus influenzae type b (Hib) vaccine  You may need this if you have certain conditions. You may receive vaccines as individual doses or as more than one vaccine together in one shot (combination vaccines). Talk with your health care provider about the risks and benefits of combination vaccines. What tests do I need? Blood tests  Lipid and cholesterol levels. These may be checked every 5 years, or more frequently depending on your overall health.  Hepatitis C test.  Hepatitis B test. Screening  Lung cancer screening. You may have this screening every year starting at age 79 if you have a 30-pack-year history of smoking and currently smoke or have quit within the past 15 years.  Colorectal cancer screening. All adults should have this screening starting at age 79 and continuing until age 66. Your health care provider may recommend screening at age 79 if you are at increased risk. You will have tests every 1-10 years, depending on your results and the type of screening test.  Diabetes screening. This is done by checking your blood sugar (glucose) after you have not eaten for a while (fasting). You may have this done every 1-3 years.  Mammogram. This may be done every 1-2 years. Talk with your health care provider about how often you should have regular mammograms.  BRCA-related cancer screening. This may be done if you have a  family history of breast, ovarian, tubal, or peritoneal cancers. Other tests  Sexually transmitted disease (STD) testing.  Bone density scan. This is done to screen for osteoporosis. You may have this done starting at age 45. Follow these instructions at home: Eating and drinking  Eat a diet that includes fresh fruits and vegetables, whole grains, lean protein, and low-fat dairy products. Limit your intake of foods with high amounts of sugar, saturated fats, and salt.  Take vitamin and mineral supplements as recommended by your health care provider.  Do not drink alcohol if your health care provider tells you not to drink.  If you drink alcohol: ? Limit how much you have to 0-1 drink a day. ? Be aware of how much alcohol is in your drink. In the [place] one drink equals one 12 oz bottle of beer (355 mL), one 5 oz glass of wine (148 mL), or one 1 oz glass of hard liquor (44 mL). Lifestyle  Take daily care of your teeth and gums.  Stay active. Exercise for at least 30 minutes on 5 or more days each week.  Do not use any products that contain nicotine or tobacco, such as cigarettes, e-cigarettes, and chewing tobacco. If you need help quitting, ask your health care provider.  If you are sexually active, practice safe sex. Use a condom or other form of protection in order to prevent STIs (sexually transmitted infections).  Talk with your health care provider about taking a low-dose aspirin or statin. What's next?  Go to your health care provider once a year for a well check visit.  Ask your health care provider how often you should have your eyes and teeth checked.  Stay up to date on all vaccines. This information is not intended to replace advice given to you by your health care provider. Make sure you discuss any questions you have with your health care provider. Document Released: 07/02/2015 Document Revised: 05/30/2018 Document Reviewed: 05/30/2018 Elsevier Patient Education   2020 [employer].

## 2019-05-21 ENCOUNTER — Encounter: Payer: Self-pay | Admitting: General Practice

## 2019-05-21 NOTE — Assessment & Plan Note (Signed)
Chronic problem.  Tolerating statin w/o difficulty.  Check labs.  Adjust meds prn  

## 2019-05-21 NOTE — Assessment & Plan Note (Addendum)
Ongoing issue for pt.  Discussed need to decrease her coffee intake as this could be contributing.  Strongly encouraged her to f/u w/ GI as this was the plan last year and she cancelled her appt.  Referral placed.

## 2019-05-21 NOTE — Assessment & Plan Note (Signed)
Chronic problem.  Following w/ Dr Posey Pronto.  Foot exam done today.  Will follow along.

## 2019-06-05 ENCOUNTER — Encounter

## 2019-06-05 ENCOUNTER — Other Ambulatory Visit: Payer: Medicare Other

## 2019-06-05 ENCOUNTER — Encounter: Payer: Self-pay | Admitting: Gastroenterology

## 2019-06-05 ENCOUNTER — Ambulatory Visit (INDEPENDENT_AMBULATORY_CARE_PROVIDER_SITE_OTHER): Payer: Medicare Other | Admitting: Gastroenterology

## 2019-06-05 VITALS — BP 134/70 | HR 80 | Temp 97.9°F | Ht 62.75 in | Wt 166.4 lb

## 2019-06-05 DIAGNOSIS — R197 Diarrhea, unspecified: Secondary | ICD-10-CM

## 2019-06-05 DIAGNOSIS — Z8619 Personal history of other infectious and parasitic diseases: Secondary | ICD-10-CM | POA: Diagnosis not present

## 2019-06-05 NOTE — Patient Instructions (Signed)
If you are age 79 or older, your body mass index should be between 23-30. Your Body mass index is 29.71 kg/m. If this is out of the aforementioned range listed, please consider follow up with your Primary Care Provider.  If you are age 79 or younger, your body mass index should be between 19-25. Your Body mass index is 29.71 kg/m. If this is out of the aformentioned range listed, please consider follow up with your Primary Care Provider.   Your provider has requested that you go to the basement level for lab work before leaving today. Press "B" on the elevator. The lab is located at the first door on the left as you exit the elevator.

## 2019-06-05 NOTE — Progress Notes (Signed)
06/05/2019 AKAI SWEETING [id number] Jul 04, 1940   HISTORY OF PRESENT ILLNESS:  This is a pleasant 78 year old female who is a patient of Dr. Ardis Hughs.  She comes here again today with complaints of diarrhea.  This has been an ongoing issue but has worsened over the past month.  Says that most of the time her stools are just watery and getting worse.  She is having nocturnal stools with some incontinence.  Has a lot of abdominal cramping after eating.  Has several things that could be contributing including Metformin, no gallbladder, IBS, but also has history of lymphocytic colitis in 2013.  Was having diarrhea at that time and this was seen on random biopsies from colonoscopy.  Was treated with budesonide.  I then saw her for complaints of diarrhea in July 2019 and she actually had C. difficile at that time.  Was treated with vancomycin and improved, but always still continues with some degree of diarrhea it seems.   Past Medical History:  Diagnosis Date  . Allergic rhinitis   . Asthma   . Diabetes mellitus    just changed from injection to metformin ? month  . Dysrhythmia    palpitations  . Glaucoma   . History of kidney stones   . Hyperlipemia   . Migraines   . Palpitations   . Pneumonia    hx  . Urinary tract infection    Past Surgical History:  Procedure Laterality Date  . ABDOMINAL HYSTERECTOMY     Has ovaries  . BACK SURGERY  1987   lumb  . BREAST SURGERY Left 07/2016   bx- needle in breast  . CHOLECYSTECTOMY  09/26/13  . CLOSED REDUCTION NASAL FRACTURE  04/29/2012   Procedure: CLOSED REDUCTION NASAL FRACTURE;  Surgeon: Jodi Marble, MD;  Location: [facility];  Service: ENT;  Laterality: N/A;  WITH STABILIZATION  . COLONOSCOPY    . Deviated septum repair    . EYE SURGERY Bilateral    cataracts  . LITHOTRIPSY    . REVERSE SHOULDER ARTHROPLASTY Right 09/15/2016   Procedure: RIGHT REVERSE SHOULDER ARTHROPLASTY;  Surgeon: Netta Cedars, MD;  Location: [facility];  Service: Orthopedics;  Laterality: Right;  . ROTATOR CUFF REPAIR     right  . TONSILLECTOMY      reports that she quit smoking about 32 years ago. Her smoking use included cigarettes. She has a 0.40 pack-year smoking history. She has never used smokeless tobacco. She reports current alcohol use. She reports that she does not use drugs. family history includes Diabetes in her father. Allergies  Allergen Reactions  . Papain Anaphylaxis    Takes an extreme concentration  . Papaya Derivatives Anaphylaxis  . Mushroom Ext Cmplx(Shiitake-Reishi-Mait) Other (See Comments)    Head congestion and sore throat  . Mushroom Extract Complex Other (See Comments)    Head congestion and sore throat  . Venlafaxine Other (See Comments)    UNSPECIFIED REACTION  Pt states she can take Name brand. Lethargy      Outpatient Encounter Medications as of 06/05/2019  Medication Sig  . atorvastatin (LIPITOR) 20 MG tablet TAKE ONE (1) TABLET BY MOUTH EACH DAY  . Biotin 5000 MCG CAPS Take 1 capsule by mouth daily.  . cetirizine (ZYRTEC) 10 MG tablet Take by mouth.  . citalopram (CELEXA) 40 MG tablet TAKE ONE (1) TABLET BY MOUTH EVERY DAY  . Doxylamine Succinate, Sleep, (SLEEP AID PO) Take 1 tablet by mouth daily.  Marland Kitchen  famotidine (PEPCID) 40 MG tablet TAKE 1 TABLET BY MOUTH AT BEDTIME  . fenofibrate (TRICOR) 145 MG tablet TAKE ONE (1) TABLET BY MOUTH EACH DAY AFTER BREAKFAST  . ibuprofen (ADVIL) 200 MG tablet Take 400 mg by mouth 2 (two) times daily.  Marland Kitchen LUMIGAN 0.01 % SOLN Place 1 drop into both eyes at bedtime.   . Melatonin 10 MG CAPS Take by mouth.  . montelukast (SINGULAIR) 10 MG tablet Take 10 mg by mouth at bedtime.  . Multiple Vitamins-Minerals (CENTRUM SILVER PO) Take 1 tablet by mouth daily.  . ONE TOUCH ULTRA TEST test strip USE 1 TIME A DAY  . ONETOUCH DELICA LANCETS 99991111 MISC Use one lancet to test sugars once daily. E11.9  . sitaGLIPtin (JANUVIA) 100 MG tablet Take 1 tablet (100 mg total) by  mouth daily.   No facility-administered encounter medications on file as of 06/05/2019.     REVIEW OF SYSTEMS  : All other systems reviewed and negative except where noted in the History of Present Illness.   PHYSICAL EXAM: BP 134/70 (BP Location: Left Arm, Patient Position: Sitting, Cuff Size: Normal)   Pulse 80   Temp 97.9 F (36.6 C)   Ht 5' 2.75" (1.594 m)   Wt 166 lb 6 oz (75.5 kg)   BMI 29.71 kg/m  General: Well developed white female in no acute distress Head: Normocephalic and atraumatic Eyes:  Sclerae anicteric, conjunctiva pink. Ears: Normal auditory acuity Lungs: Clear throughout to auscultation; no increased WOB. Heart: Regular rate and rhythm; no M/R/G. Abdomen: Soft, non-distended.  BS present, slightly hyperactive.  Non-tender. Musculoskeletal: Symmetrical with no gross deformities  Skin: No lesions on visible extremities Extremities: No edema  Neurological: Alert oriented x 4, grossly non-focal Psychological:  Alert and cooperative. Normal mood and affect  ASSESSMENT AND PLAN: *Diarrhea ongoing, but worse over the past month:  Has several things that could be contributing to chronic issues including diabetic medication, no gallbladder, IBS, but also has history of lymphocytic colitis in 2013 and then had Cdiff in 12/2017 so I think that is important to rule out with the acute worsening of her symptoms.  Will start by checking stool studies.  If negative then may benefit from empiric treatment for lymphocytic colitis with Uceris 9 mg daily.     CC:  Midge Minium, MD

## 2019-06-06 ENCOUNTER — Telehealth: Payer: Self-pay

## 2019-06-06 MED ORDER — VANCOMYCIN HCL 125 MG PO CAPS: ORAL_CAPSULE | ORAL | 0 refills | Status: DC

## 2019-06-06 NOTE — Telephone Encounter (Signed)
Left message on machine to call back.   I did leave a message to check My Chart and call the on call MD with questions. I have sent the prescription to the pharmacy.

## 2019-06-06 NOTE — Telephone Encounter (Signed)
Recd Fax and call from lab -Santiago Glad  with + C diff. Please advise.

## 2019-06-06 NOTE — Telephone Encounter (Signed)
[facility] MD: Ardis Hughs  This results was sent to me at [facility].   Studies suggest recurrent C diff. I recommend a pulsed vancomycin regimen:  Vancomycin 125 mg QID x 14 days, then Vancomycin 125 mg BID x 7 days, then Vancomycin 125 mg daily x 7 days, then Vancomycin 125 mg ever other day for 8 weeks  Please make sure that she is drinking a lot of liquids that have water, salt, and sugar. Good choices are water mixed with juice, flavored soda, and soup broth. If you are drinking enough, your urine will be light yellow or almost clear.  Try to eat a little food. Good choices are potatoes, noodles, rice, oatmeal, crackers, bananas, soup, and boiled vegetables.  Avoid high fat foods and dairy products, as they can make diarrhea worse.   Please wash your hands frequently and thoroughly to prevent the spread of the bacteria.   She should call late next week with an update in her symptoms.   Thank you.

## 2019-06-06 NOTE — Progress Notes (Signed)
I agree with the above note, plan 

## 2019-06-09 LAB — GASTROINTESTINAL PATHOGEN PANEL PCR
C. difficile Tox A/B, PCR: DETECTED — AB
Campylobacter, PCR: NOT DETECTED
Cryptosporidium, PCR: NOT DETECTED
E coli (ETEC) LT/ST PCR: NOT DETECTED
E coli (STEC) stx1/stx2, PCR: NOT DETECTED
E coli 0157, PCR: NOT DETECTED
Giardia lamblia, PCR: NOT DETECTED
Norovirus, PCR: NOT DETECTED
Rotavirus A, PCR: NOT DETECTED
Salmonella, PCR: NOT DETECTED
Shigella, PCR: NOT DETECTED

## 2019-06-09 LAB — CLOSTRIDIUM DIFFICILE TOXIN B, QUALITATIVE, REAL-TIME PCR: Toxigenic C. Difficile by PCR: DETECTED — AB

## 2019-06-10 DIAGNOSIS — Z20828 Contact with and (suspected) exposure to other viral communicable diseases: Secondary | ICD-10-CM | POA: Diagnosis not present

## 2019-06-23 ENCOUNTER — Other Ambulatory Visit: Payer: Self-pay | Admitting: Family Medicine

## 2019-06-30 ENCOUNTER — Telehealth: Payer: Self-pay | Admitting: Family Medicine

## 2019-06-30 MED ORDER — FAMOTIDINE 40 MG PO TABS: 80.0000 mg | ORAL_TABLET | Freq: Every day | ORAL | 1 refills | Status: DC

## 2019-06-30 NOTE — Telephone Encounter (Signed)
Ok to switch back to twice daily.  She had both on her med list and it was likely that the wrong one got refilled

## 2019-06-30 NOTE — Telephone Encounter (Signed)
Medication filled to pharmacy as requested.   

## 2019-06-30 NOTE — Telephone Encounter (Signed)
Pt has historically taken Famotidine 40 mg twice daily, 1 Am and 1 PM. She just noted on her most recent refill that it is now only written for once a day and she is struggling a bit with heartburn in the mornings. Was this intentional or could she have it written again for twice per day? [facility]. Pt [id number] FYI pt wanted you to know she is getting her vaccine this week.

## 2019-06-30 NOTE — Telephone Encounter (Signed)
Please advise 

## 2019-07-04 DIAGNOSIS — Z23 Encounter for immunization: Secondary | ICD-10-CM | POA: Diagnosis not present

## 2019-07-08 ENCOUNTER — Telehealth: Payer: Self-pay | Admitting: Family Medicine

## 2019-07-08 ENCOUNTER — Ambulatory Visit: Payer: Medicare Other | Admitting: Gastroenterology

## 2019-07-08 NOTE — Telephone Encounter (Signed)
Caller Name: [place], self Phone: [phone]  Pt lives at [facility]. She is has a headache and stuffy ears and [facility] is wanting to quarantine her for 14 days because they will not accept the rapid testing results. Pt is wanting Dr. Birdie Riddle to order testing for her at [facility]. Patient requesting call back from the office.  Pt called [facility] stating she was on hold for 20 minutes as caller #1. Then she called from her cell phone and was caller #2.

## 2019-07-08 NOTE — Telephone Encounter (Signed)
Spoke with patient, informed patient according to the COVID testing site, [facility] did not have any appts until Friday. Patient became irate, asking me "Are you serious? What is going on, what is the deal here? Are there no privileges for people 77+ years old? This is unbelievable." Informed patient that there was nothing that I could do to get her tested tomorrow at [facility], but that [facility] and [facility] had available appts. Scheduled patient for 9:45 AM tomorrow at [facility]. Phone called ended with patient telling me she will be there at 9:45 to be tested and that "they better be there when I get there" and hung up the phone.   Forwarding message to PCP.

## 2019-07-09 ENCOUNTER — Other Ambulatory Visit: Payer: Self-pay

## 2019-07-09 ENCOUNTER — Ambulatory Visit: Payer: Medicare Other | Attending: Internal Medicine

## 2019-07-09 DIAGNOSIS — Z20822 Contact with and (suspected) exposure to covid-19: Secondary | ICD-10-CM

## 2019-07-09 NOTE — Telephone Encounter (Signed)
Thank you for going above and beyond for this patient.  Unfortunately during a pandemic there are no privileges for anyone.  The next time she is rude to staff, she will be dismissed.

## 2019-07-10 ENCOUNTER — Telehealth: Payer: Self-pay | Admitting: Gastroenterology

## 2019-07-10 LAB — NOVEL CORONAVIRUS, NAA: SARS-CoV-2, NAA: NOT DETECTED

## 2019-07-10 MED ORDER — VANCOMYCIN HCL 125 MG PO CAPS: ORAL_CAPSULE | ORAL | 0 refills | Status: DC

## 2019-07-10 NOTE — Telephone Encounter (Signed)
Oh, instead of the taper.  Ok.  Re-prescribe as it was last given with the taper.

## 2019-07-10 NOTE — Telephone Encounter (Signed)
Pt stated that she did not read instructions and did not take vancomycin as prescribed--she took 1 t qid uat.  Pt reported that she still has diarrhea and requested another prescription and "promised to take the medication right this time."

## 2019-07-10 NOTE — Telephone Encounter (Signed)
So how did she take it?

## 2019-07-10 NOTE — Telephone Encounter (Signed)
The patient has been notified of this information and all questions answered. Vanc prescription has been sent as ordered and pt verbalized understanding of how to take the medications.

## 2019-07-10 NOTE — Telephone Encounter (Signed)
She took 1 tablet 4 times daily.  She did not follow the directions given

## 2019-07-10 NOTE — Telephone Encounter (Signed)
[name] please review and advise.  She took vancomycin incorrectly and continues to have diarrhea.

## 2019-07-29 DIAGNOSIS — Z23 Encounter for immunization: Secondary | ICD-10-CM | POA: Diagnosis not present

## 2019-08-11 ENCOUNTER — Encounter: Payer: Self-pay | Admitting: Family Medicine

## 2019-08-11 ENCOUNTER — Other Ambulatory Visit: Payer: Self-pay

## 2019-08-11 ENCOUNTER — Ambulatory Visit (INDEPENDENT_AMBULATORY_CARE_PROVIDER_SITE_OTHER): Payer: Medicare Other | Admitting: Family Medicine

## 2019-08-11 DIAGNOSIS — R300 Dysuria: Secondary | ICD-10-CM

## 2019-08-11 MED ORDER — CEPHALEXIN 500 MG PO CAPS: 500.0000 mg | ORAL_CAPSULE | Freq: Two times a day (BID) | ORAL | 0 refills | Status: DC

## 2019-08-11 NOTE — Progress Notes (Signed)
I have discussed the procedure for the virtual visit with the patient who has given consent to proceed with assessment and treatment.   Pt unable to obtain vitals.   Jordann Grime L Shondrika Hoque, CMA     

## 2019-08-11 NOTE — Progress Notes (Signed)
Virtual Visit via Video   I connected with patient on 08/11/19 at  8:30 AM EST by a video enabled telemedicine application and verified that I am speaking with the correct person using two identifiers.  Location patient: Home Location provider: Acup[facility]ist, Office Persons participating in the virtual visit: Patient, Provider, [place] (Jess B)  I discussed the limitations of evaluation and management by telemedicine and the availability of in person appointments. The patient expressed understanding and agreed to proceed.  Subjective:   HPI:   Dysuria- 'I have a urinary tract infxn'.  sxs started ~1 week ago.  'I took the little red pills and it got better but then it got worse again Friday night'.  Has been drinking plenty of fluids- eating cranberries.  Urinating 'constantly'.  'continuous pressure'.  Denies fevers.  No kidney pain.  No blood.  ROS:   See pertinent positives and negatives per HPI.  Patient Active Problem List   Diagnosis Date Noted  . Chronic diarrhea 12/26/2017  . S/P shoulder replacement, right 09/15/2016  . Benign paroxysmal positional vertigo 10/14/2014  . Back pain 08/27/2014  . Physical exam 08/27/2014  . Decreased hearing of both ears 05/19/2014  . S/P cholecystectomy 10/02/2013  . Asthma, moderate persistent 05/16/2013  . H/O recurrent pneumonia 05/16/2013  . Insomnia 05/05/2013  . Gluten intolerance 05/06/2012  . Renal calculus, left 01/29/2012  . Painful bladder spasm 12/14/2011  . Lactose intolerance 12/14/2011  . Post-menopausal 12/14/2011  . Microscopic colitis 11/08/2011  . Hepatic steatosis 08/25/2011  . Pulmonary nodule, right 08/25/2011  . Hematuria 08/23/2011  . Memory loss 12/12/2010  . Diabetes mellitus type II, controlled, with no complications ([place]) [id number]  . Hyperlipidemia 07/14/2010  . Depression with anxiety 07/14/2010  . GLAUCOMA 07/14/2010  . ALLERGIC RHINITIS 07/14/2010    Social History   Tobacco Use  .  Smoking status: Former Smoker    Packs/day: 0.10    Years: 4.00    Pack years: 0.40    Types: Cigarettes    Quit date: 06/19/1986    Years since quitting: 33.1  . Smokeless tobacco: Never Used  . Tobacco comment: smoked in college years ago  Substance Use Topics  . Alcohol use: Yes    Comment: 1-2 glasses week variety beer occ scotch    Current Outpatient Medications:  .  atorvastatin (LIPITOR) 20 MG tablet, TAKE ONE (1) TABLET BY MOUTH EACH DAY, Disp: 90 tablet, Rfl: 1 .  Biotin 5000 MCG CAPS, Take 1 capsule by mouth daily., Disp: , Rfl:  .  cetirizine (ZYRTEC) 10 MG tablet, Take by mouth., Disp: , Rfl:  .  citalopram (CELEXA) 40 MG tablet, TAKE ONE (1) TABLET BY MOUTH EVERY DAY, Disp: 90 tablet, Rfl: 1 .  Doxylamine Succinate, Sleep, (SLEEP AID PO), Take 1 tablet by mouth daily., Disp: , Rfl:  .  famotidine (PEPCID) 40 MG tablet, Take 2 tablets (80 mg total) by mouth at bedtime., Disp: 180 tablet, Rfl: 1 .  fenofibrate (TRICOR) 145 MG tablet, TAKE ONE (1) TABLET BY MOUTH EACH DAY AFTER BREAKFAST, Disp: 30 tablet, Rfl: 6 .  ibuprofen (ADVIL) 200 MG tablet, Take 400 mg by mouth 2 (two) times daily., Disp: , Rfl:  .  LUMIGAN 0.01 % SOLN, Place 1 drop into both eyes at bedtime. , Disp: , Rfl:  .  Melatonin 10 MG CAPS, Take by mouth., Disp: , Rfl:  .  montelukast (SINGULAIR) 10 MG tablet, Take 10 mg by mouth at bedtime., Disp: ,  Rfl:  .  Multiple Vitamins-Minerals (CENTRUM SILVER PO), Take 1 tablet by mouth daily., Disp: , Rfl:  .  ONE TOUCH ULTRA TEST test strip, USE 1 TIME A DAY, Disp: 50 each, Rfl: 12 .  ONETOUCH DELICA LANCETS 99991111 MISC, Use one lancet to test sugars once daily. E11.9, Disp: 100 each, Rfl: 12 .  sitaGLIPtin (JANUVIA) 100 MG tablet, Take 1 tablet (100 mg total) by mouth daily., Disp: 90 tablet, Rfl: 0 .  vancomycin (VANCOCIN HCL) 125 MG capsule, Take 1 capsule (125 mg total) by mouth 4 (four) times daily for 14 days, THEN 1 capsule (125 mg total) 2 (two) times daily for 7  days, THEN 1 capsule (125 mg total) daily for 7 days, THEN 1 capsule (125 mg total) every other day. (Patient not taking: Reported on 08/11/2019), Disp: 105 capsule, Rfl: 0  Allergies  Allergen Reactions  . Papain Anaphylaxis    Takes an extreme concentration  . Papaya Derivatives Anaphylaxis  . Mushroom Ext Cmplx(Shiitake-Reishi-Mait) Other (See Comments)    Head congestion and sore throat  . Mushroom Extract Complex Other (See Comments)    Head congestion and sore throat  . Venlafaxine Other (See Comments)    UNSPECIFIED REACTION  Pt states she can take Name brand. Lethargy    Objective:   There were no vitals taken for this visit. AAOx3, NAD NCAT, EOMI No obvious CN deficits Coloring WNL Pt is able to speak clearly, coherently without shortness of breath or increased work of breathing.  Thought process is linear.  Mood is appropriate.   Assessment and Plan:   Dysuria- pt's sxs are consistent w/ likely UTI.  Start Keflex.  Encouraged continued fluids.  If no improvement will need to come in for UA and culture.  Pt expressed understanding and is in agreement w/ plan.    Annye Asa, MD 08/11/2019

## 2019-08-14 ENCOUNTER — Ambulatory Visit: Payer: Medicare Other | Admitting: Gastroenterology

## 2019-08-14 DIAGNOSIS — M25512 Pain in left shoulder: Secondary | ICD-10-CM | POA: Diagnosis not present

## 2019-08-15 ENCOUNTER — Ambulatory Visit (INDEPENDENT_AMBULATORY_CARE_PROVIDER_SITE_OTHER): Payer: Medicare Other | Admitting: Gastroenterology

## 2019-08-15 ENCOUNTER — Encounter: Payer: Self-pay | Admitting: Gastroenterology

## 2019-08-15 VITALS — BP 124/60 | HR 98 | Temp 97.8°F | Ht 63.0 in | Wt 164.0 lb

## 2019-08-15 DIAGNOSIS — R197 Diarrhea, unspecified: Secondary | ICD-10-CM | POA: Diagnosis not present

## 2019-08-15 NOTE — Progress Notes (Signed)
Review of pertinent gastrointestinal problems:  1. routine risk for colon cancer, no polyps on colonoscopy April 2013  2. microscopic, lymphocytic colitis noted on biopsies, colonoscopy April 2013 with normal colon, normal terminal ileum, random colon biopsies performed. Started on budesonide 9mg  a day 3.  C. difficile 2019 also 2020    HPI: [name]  I have not seen her in 8 years.  She has visited with Janett Billow here in our office twice in the past year or so however.  At the time of her last visit she had complaints of diarrhea.  December 2020.  She felt several things could be contributing to that including her lymphocytic colitis diagnosis, the fact that she does not have a gallbladder, also her Metformin use.  She has a history of C. difficile as well.  C. difficile was retested and it was positive.  She was put on vancomycin however she did not take it correctly.  She called back and she agreed to take it at the correct tapering regimen that we recommended and we called in another prescription for her.  She never took that second prescription of vancomycin and she has been doing actually quite well.  She has 1 solid stool in the morning and sometimes a couple looser ones later on in the morning.  She drinks about 36 ounces of coffee every morning.  Her weight has been overall stable.  She has no bleeding.  She does not have any nocturnal stooling.  She was very happy with her interactions with Janett Billow.   ROS: complete GI ROS as described in HPI, all other review negative.  Constitutional:  No unintentional weight loss   Past Medical History:  Diagnosis Date  . Allergic rhinitis   . Asthma   . Diabetes mellitus    just changed from injection to metformin ? month  . Dysrhythmia    palpitations  . Glaucoma   . History of kidney stones   . Hyperlipemia   . Migraines   . Palpitations   . Pneumonia    hx  . Urinary tract infection     Past  Surgical History:  Procedure Laterality Date  . ABDOMINAL HYSTERECTOMY     Has ovaries  . BACK SURGERY  1987   lumb  . BREAST SURGERY Left 07/2016   bx- needle in breast  . CHOLECYSTECTOMY  09/26/13  . CLOSED REDUCTION NASAL FRACTURE  04/29/2012   Procedure: CLOSED REDUCTION NASAL FRACTURE;  Surgeon: Jodi Marble, MD;  Location: [place];  Service: ENT;  Laterality: N/A;  WITH STABILIZATION  . COLONOSCOPY    . Deviated septum repair    . EYE SURGERY Bilateral    cataracts  . LITHOTRIPSY    . REVERSE SHOULDER ARTHROPLASTY Right 09/15/2016   Procedure: RIGHT REVERSE SHOULDER ARTHROPLASTY;  Surgeon: Netta Cedars, MD;  Location: [facility];  Service: Orthopedics;  Laterality: Right;  . ROTATOR CUFF REPAIR     right  . TONSILLECTOMY      Current Outpatient Medications  Medication Sig Dispense Refill  . atorvastatin (LIPITOR) 20 MG tablet TAKE ONE (1) TABLET BY MOUTH EACH DAY 90 tablet 1  . Biotin 5000 MCG CAPS Take 1 capsule by mouth daily.    . cephALEXin (KEFLEX) 500 MG capsule Take 1 capsule (500 mg total) by mouth 2 (two) times daily for 10 doses. 10 capsule 0  . cetirizine (ZYRTEC) 10 MG tablet Take by mouth.    . famotidine (  PEPCID) 40 MG tablet Take 2 tablets (80 mg total) by mouth at bedtime. 180 tablet 1  . fenofibrate (TRICOR) 145 MG tablet TAKE ONE (1) TABLET BY MOUTH EACH DAY AFTER BREAKFAST 30 tablet 6  . ibuprofen (ADVIL) 200 MG tablet Take 400 mg by mouth 2 (two) times daily.    Marland Kitchen LUMIGAN 0.01 % SOLN Place 1 drop into both eyes at bedtime.     . Melatonin 10 MG CAPS Take by mouth.    . montelukast (SINGULAIR) 10 MG tablet Take 10 mg by mouth at bedtime.    . Multiple Vitamins-Minerals (CENTRUM SILVER PO) Take 1 tablet by mouth daily.    . sitaGLIPtin (JANUVIA) 100 MG tablet Take 1 tablet (100 mg total) by mouth daily. 90 tablet 0   No current facility-administered medications for this visit.    Allergies as of 08/15/2019 - Review Complete 08/15/2019   Allergen Reaction Noted  . Papain Anaphylaxis 01/29/2012  . Papaya derivatives Anaphylaxis 08/23/2011  . Mushroom ext cmplx(shiitake-reishi-mait) Other (See Comments) 08/23/2011  . Mushroom extract complex Other (See Comments) 11/18/2014  . Venlafaxine Other (See Comments) 08/23/2011    Family History  Problem Relation Age of Onset  . Diabetes Father   . Colon cancer Neg Hx   . Esophageal cancer Neg Hx   . Rectal cancer Neg Hx   . Stomach cancer Neg Hx   . Breast cancer Neg Hx     Social History   Socioeconomic History  . Marital status: Widowed    Spouse name: Not on file  . Number of children: 2  . Years of education: Not on file  . Highest education level: Not on file  Occupational History  . Occupation: Retired -Sports coach: RETIRED  Tobacco Use  . Smoking status: Former Smoker    Packs/day: 0.10    Years: 4.00    Pack years: 0.40    Types: Cigarettes    Quit date: 06/19/1986    Years since quitting: 33.1  . Smokeless tobacco: Never Used  . Tobacco comment: smoked in college years ago  Substance and Sexual Activity  . Alcohol use: Yes    Comment: 1-2 glasses week variety beer occ scotch  . Drug use: No  . Sexual activity: Not Currently  Other Topics Concern  . Not on file  Social History Narrative   Daily caffeine    Social Determinants of Health   Financial Resource Strain:   . Difficulty of Paying Living Expenses: Not on file  Food Insecurity:   . Worried About Charity fundraiser in the Last Year: Not on file  . Ran Out of Food in the Last Year: Not on file  Transportation Needs:   . Lack of Transportation (Medical): Not on file  . Lack of Transportation (Non-Medical): Not on file  Physical Activity:   . Days of Exercise per Week: Not on file  . Minutes of Exercise per Session: Not on file  Stress:   . Feeling of Stress : Not on file  Social Connections:   . Frequency of Communication with Friends and Family: Not on file  .  Frequency of Social Gatherings with Friends and Family: Not on file  . Attends Religious Services: Not on file  . Active Member of Clubs or Organizations: Not on file  . Attends Archivist Meetings: Not on file  . Marital Status: Not on file  Intimate Partner Violence:   . Fear of Current or  Ex-Partner: Not on file  . Emotionally Abused: Not on file  . Physically Abused: Not on file  . Sexually Abused: Not on file     Physical Exam: BP 124/60   Pulse 98   Temp 97.8 F (36.6 C)   Ht 5\' 3"  (1.6 m)   Wt 164 lb (74.4 kg)   BMI 29.05 kg/m  Constitutional: generally well-appearing Psychiatric: alert and oriented x3 Abdomen: soft, nontender, nondistended, no obvious ascites, no peritoneal signs, normal bowel sounds No peripheral edema noted in lower extremities  Assessment and plan: 80 y.o. female with resolved C. difficile associated diarrhea  She is clearly improved since the vancomycin however she never really did take it the correct tapering regimen likely recommended.  So I do not think she needs further treatment now.  I did recommend she consider taking an Imodium periodically but to call here if she has significant severe diarrhea again.  We discussed colon cancer screening with colonoscopy.  It has been about 10 years since her last one.  She is not interested in colon cancer screening any further, she is turning 1 in May.  Please see the "Patient Instructions" section for addition details about the plan.  Owens Loffler, MD [facility] 08/15/2019, 2:27 PM   Total time on date of encounter was 20 minutes (this included time spent preparing to see the patient reviewing records; obtaining and/or reviewing separately obtained history; performing a medically appropriate exam and/or evaluation; counseling and educating the patient and family if present; ordering medications, tests or procedures if applicable; and documenting clinical information in the  health record).

## 2019-08-15 NOTE — Patient Instructions (Signed)
If you are age 80 or older, your body mass index should be between 23-30. Your Body mass index is 29.05 kg/m. If this is out of the aforementioned range listed, please consider follow up with your Primary Care Provider.  If you are age 8 or younger, your body mass index should be between 19-25. Your Body mass index is 29.05 kg/m. If this is out of the aformentioned range listed, please consider follow up with your Primary Care Provider.   You can take Immodium over-the-counter every day or on an as needed basis.   You will follow up with our office on an as needed basis.  Thank you, Dr Ardis Hughs

## 2019-08-19 ENCOUNTER — Other Ambulatory Visit: Payer: Self-pay | Admitting: Family Medicine

## 2019-08-19 ENCOUNTER — Telehealth: Payer: Self-pay

## 2019-08-19 MED ORDER — CEPHALEXIN 500 MG PO CAPS: 500.0000 mg | ORAL_CAPSULE | Freq: Two times a day (BID) | ORAL | 0 refills | Status: AC

## 2019-08-19 NOTE — Telephone Encounter (Signed)
Please advise 

## 2019-08-19 NOTE — Addendum Note (Signed)
Addended by: Desmond Dike L on: 08/19/2019 11:11 AM   Modules accepted: Orders

## 2019-08-19 NOTE — Telephone Encounter (Signed)
Medication filled to pharmacy as requested.  Called and LMOVM to inform pt of PCP recommendations.

## 2019-08-19 NOTE — Telephone Encounter (Signed)
We can send in another 5 days of Cephalexin 500mg  BID, #10 but if symptoms return she needs to call her urologist and make them aware.

## 2019-08-19 NOTE — Telephone Encounter (Signed)
Patient called in stating that her UTI symptoms had gotten better but she woke up this morning and she stated it hurt to urinate. She reports this as the only symptom. She was seen for this on 08/11/19 and would like to know if you could call in the same medication that she previously used to [facility]. Patient states she will not be home and they deliver all her other medications this afternoon.

## 2019-09-23 ENCOUNTER — Telehealth: Payer: Self-pay | Admitting: Family Medicine

## 2019-09-23 NOTE — Progress Notes (Signed)
  Chronic Care Management   Note  09/23/2019 Name: KENYANNA BIGFORD MRN: [id number] DOB: December 15, 1940  HAELYN RISSLER is a 79 y.o. year old female who is a primary care patient of Birdie Riddle, Aundra Millet, MD. I reached out to Lottie Dawson by phone today in response to a referral sent by Ms. Darius Bump PCP, Midge Minium, MD.   Ms. Wilmoth was given information about Chronic Care Management services today including:  1. CCM service includes personalized support from designated clinical staff supervised by her physician, including individualized plan of care and coordination with other care providers 2. 24/7 contact phone numbers for assistance for urgent and routine care needs. 3. Service will only be billed when office clinical staff spend 20 minutes or more in a month to coordinate care. 4. Only one practitioner may furnish and bill the service in a calendar month. 5. The patient may stop CCM services at any time (effective at the end of the month) by phone call to the office staff.   Patient did not agree to services and wishes to consider information provided before deciding about enrollment in care management services.   Follow up plan:   Earney Hamburg Upstream Scheduler

## 2019-09-25 ENCOUNTER — Telehealth: Payer: Self-pay

## 2019-09-25 DIAGNOSIS — H9190 Unspecified hearing loss, unspecified ear: Secondary | ICD-10-CM

## 2019-09-25 NOTE — Telephone Encounter (Signed)
Ok for referral?

## 2019-09-25 NOTE — Telephone Encounter (Signed)
Referral placed.

## 2019-09-25 NOTE — Telephone Encounter (Signed)
Patient states she needs a referral to [facility]. She has an appointment set up with Dr. Lonzo Candy.

## 2019-09-25 NOTE — Telephone Encounter (Signed)
Referral has been faxed to [email or web address]

## 2019-09-26 ENCOUNTER — Telehealth: Payer: Self-pay | Admitting: Family Medicine

## 2019-09-26 NOTE — Progress Notes (Signed)
  Chronic Care Management   Note  09/26/2019 Name: [name] MRN: [id number] DOB: 1940/12/04  [name] is a 79 y.o. year old female who is a primary care patient of Birdie Riddle, Aundra Millet, MD. I reached out to Lottie Dawson by phone today in response to a referral sent by Ms. Darius Bump PCP, Midge Minium, MD.   Ms. Salaiz was given information about Chronic Care Management services today including:  1. CCM service includes personalized support from designated clinical staff supervised by her physician, including individualized plan of care and coordination with other care providers 2. 24/7 contact phone numbers for assistance for urgent and routine care needs. 3. Service will only be billed when office clinical staff spend 20 minutes or more in a month to coordinate care. 4. Only one practitioner may furnish and bill the service in a calendar month. 5. The patient may stop CCM services at any time (effective at the end of the month) by phone call to the office staff.   Patient agreed to services and verbal consent obtained.   Follow up plan:   Earney Hamburg Upstream Scheduler

## 2019-09-26 NOTE — Progress Notes (Signed)
  Chronic Care Management   Outreach Note  09/26/2019 Name: [name] MRN: [id number] DOB: August 13, 1940  Referred by: Midge Minium, MD Reason for referral : No chief complaint on file.   A second unsuccessful telephone outreach was attempted today. The patient was referred to pharmacist for assistance with care management and care coordination.  Follow Up Plan:   Earney Hamburg Upstream Scheduler

## 2019-10-10 ENCOUNTER — Other Ambulatory Visit: Payer: Self-pay | Admitting: General Practice

## 2019-10-10 DIAGNOSIS — F418 Other specified anxiety disorders: Secondary | ICD-10-CM

## 2019-10-10 DIAGNOSIS — E782 Mixed hyperlipidemia: Secondary | ICD-10-CM

## 2019-10-10 DIAGNOSIS — E119 Type 2 diabetes mellitus without complications: Secondary | ICD-10-CM

## 2019-10-17 ENCOUNTER — Telehealth: Payer: Self-pay | Admitting: Family Medicine

## 2019-10-17 ENCOUNTER — Other Ambulatory Visit: Payer: Self-pay | Admitting: Family Medicine

## 2019-10-17 NOTE — Telephone Encounter (Signed)
Medication filled to pharmacy as requested.   

## 2019-10-17 NOTE — Telephone Encounter (Signed)
MEDICATION: atorvastatin (LIPITOR) 20 MG tablet   PHARMACY: [facility] - [place], [place] - [address] Phone:  [phone]  Fax:  [phone]       Comments:   Patient is completely out.      **Let patient know to contact pharmacy at the end of the day to make sure medication is ready. **  ** Please notify patient to allow 48-72 hours to process**  **Encourage patient to contact the pharmacy for refills or they can request refills through [facility]**

## 2019-10-21 ENCOUNTER — Ambulatory Visit: Payer: Medicare Other

## 2019-10-21 ENCOUNTER — Other Ambulatory Visit: Payer: Self-pay

## 2019-10-21 DIAGNOSIS — E782 Mixed hyperlipidemia: Secondary | ICD-10-CM

## 2019-10-21 DIAGNOSIS — F418 Other specified anxiety disorders: Secondary | ICD-10-CM

## 2019-10-21 DIAGNOSIS — E119 Type 2 diabetes mellitus without complications: Secondary | ICD-10-CM

## 2019-10-21 NOTE — Patient Instructions (Signed)
Visit Information  Goals Addressed            This Visit's Progress   . A1c <7%       CARE PLAN ENTRY (see longitudinal plan of care for additional care plan information)  Current Barriers:  . Diabetes: type 2 Lab Results  Component Value Date   HGBA1C 6.8 (H) 11/21/2018 .   Lab Results  Component Value Date   CREATININE 0.79 05/20/2019   CREATININE 0.82 02/03/2019   CREATININE 0.92 11/21/2018 .  Current antihyperglycemic regimen:  o Sitagliptan 100 mg daily . Denies hypoglycemic symptoms, including dizziness, lightheadedness, shaking, sweating . Cardiovascular risk reduction: o Current hyperlipidemia regimen: atorvastatin 20 mg daily, fenofibrate 145 mg daily.  o Reduce NSAID use Pharmacist Clinical Goal(s):  Marland Kitchen Over the next 180 days, patient will work with PharmD and primary care provider to address type 2 diabetes. o Maintain A1c <7% Interventions: . Comprehensive medication review performed, medication list updated in electronic medical record . Inter-disciplinary care team collaboration  . Reduce use of ibuprofen to 200 mg twice daily, adding on voltaren gel if needed Patient Self Care Activities:  . Patient will focus on medication adherence by continuing current medication management. . Patient will report any questions or concerns to provider  Initial goal documentation.     Marland Kitchen Anxiety/Depression: Minimize recurring symptoms       CARE PLAN ENTRY Current Barriers:  . Chronic Disease Management support, education, and care coordination needs related to anxiety/depression . Current regimen o Citalopram 40 mg once daily Pharmacist Clinical Goal(s):  Marland Kitchen Over next 180 days: Minimize recurring symptoms of depression.  Interventions: . Comprehensive medication review performed. Patient Self Care Activities:  . Patient verbalizes understanding of plan to continue medication as prescribed over next 180 days. Please call with any questions! Initial goal  documentation.    . LDL Cholesterol <100       CARE PLAN ENTRY Current Barriers:  . Current antihyperlipidemic regimen: o Atorvastatin 20 mg daily o Fenofibrate 145 mg daily . Most recent lipid panel:     Component Value Date/Time   CHOL 121 05/20/2019 1125   TRIG 144.0 05/20/2019 1125   HDL 35.40 (L) 05/20/2019 1125   CHOLHDL 3 05/20/2019 1125   VLDL 28.8 05/20/2019 1125   LDLCALC 57 05/20/2019 1125   LDLCALC 70 08/15/2017 1533   LDLDIRECT 131.0 02/29/2016 1055  Pharmacist Clinical Goal(s):  Marland Kitchen Over the next 180 days, patient will work with PharmD and providers towards optimized antihyperlipidemic therapy o Maintain LDL cholesterol < 100 Interventions: . Comprehensive medication review performed; medication list updated in electronic medical record.  [name] care team collaboration (see longitudinal plan of care) Patient Self Care Activities:  . Patient will focus on medication adherence by continuing current medication management.  Initial goal documentation.       [name] was given information about Chronic Care Management services today including:  1. CCM service includes personalized support from designated clinical staff supervised by her physician, including individualized plan of care and coordination with other care providers 2. 24/7 contact phone numbers for assistance for urgent and routine care needs. 3. Standard insurance, coinsurance, copays and deductibles apply for chronic care management only during months in which we provide at least 20 minutes of these services. Most insurances cover these services at 100%, however patients may be responsible for any copay, coinsurance and/or deductible if applicable. This service may help you avoid the need for more expensive face-to-face services. 4. Only  one practitioner may furnish and bill the service in a calendar month. 5. The patient may stop CCM services at any time (effective at the end of the month) by  phone call to the office staff.  Patient agreed to services and verbal consent obtained.   The patient verbalized understanding of instructions provided today and agreed to receive a mailed copy of patient instruction and/or educational materials. Telephone follow up appointment with pharmacy team member scheduled for: See next appointment with "Care Management Staff" under "What's Next" below.   Thank you!  Madelin Rear, Pharm.D. Clinical Pharmacist [facility] [phone] Exercising to Lose Weight Exercise is structured, repetitive physical activity to improve fitness and health. Getting regular exercise is important for everyone. It is especially important if you are overweight. Being overweight increases your risk of heart disease, stroke, diabetes, high blood pressure, and several types of cancer. Reducing your calorie intake and exercising can help you lose weight. Exercise is usually categorized as moderate or vigorous intensity. To lose weight, most people need to do a certain amount of moderate-intensity or vigorous-intensity exercise each week. Moderate-intensity exercise  Moderate-intensity exercise is any activity that gets you moving enough to burn at least three times more energy (calories) than if you were sitting. Examples of moderate exercise include:  Walking a mile in 15 minutes.  Doing light yard work.  Biking at an easy pace. Most people should get at least 150 minutes (2 hours and 30 minutes) a week of moderate-intensity exercise to maintain their body weight. Vigorous-intensity exercise Vigorous-intensity exercise is any activity that gets you moving enough to burn at least six times more calories than if you were sitting. When you exercise at this intensity, you should be working hard enough that you are not able to carry on a conversation. Examples of vigorous exercise include:  Running.  Playing a team sport, such as  football, basketball, and soccer.  Jumping rope. Most people should get at least 75 minutes (1 hour and 15 minutes) a week of vigorous-intensity exercise to maintain their body weight. How can exercise affect me? When you exercise enough to burn more calories than you eat, you lose weight. Exercise also reduces body fat and builds muscle. The more muscle you have, the more calories you burn. Exercise also:  Improves mood.  Reduces stress and tension.  Improves your overall fitness, flexibility, and endurance.  Increases bone strength. The amount of exercise you need to lose weight depends on:  Your age.  The type of exercise.  Any health conditions you have.  Your overall physical ability. Talk to your health care provider about how much exercise you need and what types of activities are safe for you. What actions can I take to lose weight? Nutrition   Make changes to your diet as told by your health care provider or diet and nutrition specialist (dietitian). This may include: ? Eating fewer calories. ? Eating more protein. ? Eating less unhealthy fats. ? Eating a diet that includes fresh fruits and vegetables, whole grains, low-fat dairy products, and lean protein. ? Avoiding foods with added fat, salt, and sugar.  Drink plenty of water while you exercise to prevent dehydration or heat stroke. Activity  Choose an activity that you enjoy and set realistic goals. Your health care provider can help you make an exercise plan that works for you.  Exercise at a moderate or vigorous intensity most days of the week. ? The intensity of exercise  may vary from person to person. You can tell how intense a workout is for you by paying attention to your breathing and heartbeat. Most people will notice their breathing and heartbeat get faster with more intense exercise.  Do resistance training twice each week, such as: ? Push-ups. ? Sit-ups. ? Lifting weights. ? Using resistance  bands.  Getting short amounts of exercise can be just as helpful as long structured periods of exercise. If you have trouble finding time to exercise, try to include exercise in your daily routine. ? Get up, stretch, and walk around every 30 minutes throughout the day. ? Go for a walk during your lunch break. ? Park your car farther away from your destination. ? If you take public transportation, get off one stop early and walk the rest of the way. ? Make phone calls while standing up and walking around. ? Take the stairs instead of elevators or escalators.  Wear comfortable clothes and shoes with good support.  Do not exercise so much that you hurt yourself, feel dizzy, or get very short of breath. Where to find more information  [place] Department of Health and Human Services: [employer]  Centers for Disease Control and Prevention (CDC): http://www.wolf.info/ Contact a health care provider:  Before starting a new exercise program.  If you have questions or concerns about your weight.  If you have a medical problem that keeps you from exercising. Get help right away if you have any of the following while exercising:  Injury.  Dizziness.  Difficulty breathing or shortness of breath that does not go away when you stop exercising.  Chest pain.  Rapid heartbeat. Summary  Being overweight increases your risk of heart disease, stroke, diabetes, high blood pressure, and several types of cancer.  Losing weight happens when you burn more calories than you eat.  Reducing the amount of calories you eat in addition to getting regular moderate or vigorous exercise each week helps you lose weight. This information is not intended to replace advice given to you by your health care provider. Make sure you discuss any questions you have with your health care provider. Document Revised: 06/18/2017 Document Reviewed: 06/18/2017 Elsevier Patient Education  [place].   High  Cholesterol  High cholesterol is a condition in which the blood has high levels of a white, waxy, fat-like substance (cholesterol). The human body needs small amounts of cholesterol. The liver makes all the cholesterol that the body needs. Extra (excess) cholesterol comes from the food that we eat. Cholesterol is carried from the liver by the blood through the blood vessels. If you have high cholesterol, deposits (plaques) may build up on the walls of your blood vessels (arteries). Plaques make the arteries narrower and stiffer. Cholesterol plaques increase your risk for heart attack and stroke. Work with your health care provider to keep your cholesterol levels in a healthy range. What increases the risk? This condition is more likely to develop in people who:  Eat foods that are high in animal fat (saturated fat) or cholesterol.  Are overweight.  Are not getting enough exercise.  Have a family history of high cholesterol. What are the signs or symptoms? There are no symptoms of this condition. How is this diagnosed? This condition may be diagnosed from the results of a blood test.  If you are older than age 74, your health care provider may check your cholesterol every 4-6 years.  You may be checked more often if you already have high  cholesterol or other risk factors for heart disease. The blood test for cholesterol measures:  "Bad" cholesterol (LDL cholesterol). This is the main type of cholesterol that causes heart disease. The desired level for LDL is less than 100.  "Good" cholesterol (HDL cholesterol). This type helps to protect against heart disease by cleaning the arteries and carrying the LDL away. The desired level for HDL is 60 or higher.  Triglycerides. These are fats that the body can store or burn for energy. The desired number for triglycerides is lower than 150.  Total cholesterol. This is a measure of the total amount of cholesterol in your blood, including LDL  cholesterol, HDL cholesterol, and triglycerides. A healthy number is less than 200. How is this treated? This condition is treated with diet changes, lifestyle changes, and medicines. Diet changes  This may include eating more whole grains, fruits, vegetables, nuts, and fish.  This may also include cutting back on red meat and foods that have a lot of added sugar. Lifestyle changes  Changes may include getting at least 40 minutes of aerobic exercise 3 times a week. Aerobic exercises include walking, biking, and swimming. Aerobic exercise along with a healthy diet can help you maintain a healthy weight.  Changes may also include quitting smoking. Medicines  Medicines are usually given if diet and lifestyle changes have failed to reduce your cholesterol to healthy levels.  Your health care provider may prescribe a statin medicine. Statin medicines have been shown to reduce cholesterol, which can reduce the risk of heart disease. Follow these instructions at home: Eating and drinking If told by your health care provider:  Eat chicken (without skin), fish, veal, shellfish, ground [place] breast, and round or loin cuts of red meat.  Do not eat fried foods or fatty meats, such as hot dogs and salami.  Eat plenty of fruits, such as apples.  Eat plenty of vegetables, such as broccoli, potatoes, and carrots.  Eat beans, peas, and lentils.  Eat grains such as barley, rice, couscous, and bulgur wheat.  Eat pasta without cream sauces.  Use skim or nonfat milk, and eat low-fat or nonfat yogurt and cheeses.  Do not eat or drink whole milk, cream, ice cream, egg yolks, or hard cheeses.  Do not eat stick margarine or tub margarines that contain trans fats (also called partially hydrogenated oils).  Do not eat saturated tropical oils, such as coconut oil and palm oil.  Do not eat cakes, cookies, crackers, or other baked goods that contain trans fats.  General instructions  Exercise as  directed by your health care provider. Increase your activity level with activities such as gardening, walking, and taking the stairs.  Take over-the-counter and prescription medicines only as told by your health care provider.  Do not use any products that contain nicotine or tobacco, such as cigarettes and e-cigarettes. If you need help quitting, ask your health care provider.  Keep all follow-up visits as told by your health care provider. This is important. Contact a health care provider if:  You are struggling to maintain a healthy diet or weight.  You need help to start on an exercise program.  You need help to stop smoking. Get help right away if:  You have chest pain.  You have trouble breathing. This information is not intended to replace advice given to you by your health care provider. Make sure you discuss any questions you have with your health care provider. Document Revised: 06/08/2017 Document Reviewed: 12/04/2015 Elsevier  Patient Education  [employer].

## 2019-10-21 NOTE — Progress Notes (Signed)
Chronic Care Management Pharmacy  Name: [name]  MRN: [id number] DOB: 02-21-1941  Chief Complaint/ HPI  [name],  79 y.o. , female presents for their Initial CCM visit with the clinical pharmacist via telephone due to COVID-19 Pandemic.  PCP : Midge Minium, MD  Their chronic conditions include: HLD, DM, GERD, depression/anxiety, asthma .  Office Visits:  2/22 (PCP): UTI symptoms, given keflex 500 mg BID x days.   12/1 (PCP): AWV. C/O worsening diarrhea. Start multivitamin. Discussed decreasing coffee intake, referred to GI.  Consult Visits:  2/26 (Dr. Ardis Hughs, Gertie Fey): Recent dgx and hx of C. Difficile. Had not been taking PO vanc correctly then restarted as directed and symptoms of loose stools improved. PRN Imodium recommended.   Outpatient Encounter Medications as of 10/21/2019  Medication Sig Note  . atorvastatin (LIPITOR) 20 MG tablet TAKE ONE (1) TABLET BY MOUTH EACH DAY   . Biotin 5000 MCG CAPS Take 1 capsule by mouth daily.   . cetirizine (ZYRTEC) 10 MG tablet Take by mouth.   . citalopram (CELEXA) 40 MG tablet Take 40 mg by mouth daily.   . famotidine (PEPCID) 40 MG tablet Take 2 tablets (80 mg total) by mouth at bedtime.   . fenofibrate (TRICOR) 145 MG tablet TAKE ONE TABLET BY MOUTH EVERY MORNING AFTER BREAKFAST   . ibuprofen (ADVIL) 200 MG tablet Take 200 mg by mouth 2 (two) times daily.    Marland Kitchen LUMIGAN 0.01 % SOLN Place 1 drop into both eyes at bedtime.  10/14/2014: Received from: External Pharmacy  . Melatonin 10 MG CAPS Take by mouth.   . montelukast (SINGULAIR) 10 MG tablet Take 10 mg by mouth at bedtime.   . Multiple Vitamins-Minerals (CENTRUM SILVER PO) Take 1 tablet by mouth daily.   . sitaGLIPtin (JANUVIA) 100 MG tablet Take 1 tablet (100 mg total) by mouth daily.    No facility-administered encounter medications on file as of 10/21/2019.   Current Diagnosis/Assessment: Goals Addressed            This Visit's Progress   . A1c <7%        CARE PLAN ENTRY (see longitudinal plan of care for additional care plan information)  Current Barriers:  . Diabetes: type 2 Lab Results  Component Value Date   HGBA1C 6.8 (H) 11/21/2018 .   Lab Results  Component Value Date   CREATININE 0.79 05/20/2019   CREATININE 0.82 02/03/2019   CREATININE 0.92 11/21/2018 .  Current antihyperglycemic regimen:  o Sitagliptan 100 mg daily . Denies hypoglycemic symptoms, including dizziness, lightheadedness, shaking, sweating . Cardiovascular risk reduction: o Current hyperlipidemia regimen: atorvastatin 20 mg daily, fenofibrate 145 mg daily.  o Reduce NSAID use Pharmacist Clinical Goal(s):  Marland Kitchen Over the next 180 days, patient will work with PharmD and primary care provider to address type 2 diabetes. o Maintain A1c <7% Interventions: . Comprehensive medication review performed, medication list updated in electronic medical record . Inter-disciplinary care team collaboration  . Reduce use of ibuprofen to 200 mg twice daily, adding on voltaren gel if needed Patient Self Care Activities:  . Patient will focus on medication adherence by continuing current medication management. . Patient will report any questions or concerns to provider  Initial goal documentation.     Marland Kitchen Anxiety/Depression: Minimize recurring symptoms       CARE PLAN ENTRY Current Barriers:  . Chronic Disease Management support, education, and care coordination needs related to anxiety/depression . Current regimen o Citalopram 40 mg  once daily Pharmacist Clinical Goal(s):  Marland Kitchen Over next 180 days: Minimize recurring symptoms of depression.  Interventions: . Comprehensive medication review performed. Patient Self Care Activities:  . Patient verbalizes understanding of plan to continue medication as prescribed over next 180 days. Please call with any questions! Initial goal documentation.    . LDL Cholesterol <100       CARE PLAN ENTRY Current Barriers:  . Current  antihyperlipidemic regimen: o Atorvastatin 20 mg daily o Fenofibrate 145 mg daily . Most recent lipid panel:     Component Value Date/Time   CHOL 121 05/20/2019 1125   TRIG 144.0 05/20/2019 1125   HDL 35.40 (L) 05/20/2019 1125   CHOLHDL 3 05/20/2019 1125   VLDL 28.8 05/20/2019 1125   LDLCALC 57 05/20/2019 1125   LDLCALC 70 08/15/2017 1533   LDLDIRECT 131.0 02/29/2016 1055  Pharmacist Clinical Goal(s):  Marland Kitchen Over the next 180 days, patient will work with PharmD and providers towards optimized antihyperlipidemic therapy o Maintain LDL cholesterol < 100 Interventions: . Comprehensive medication review performed; medication list updated in electronic medical record.  [name] care team collaboration (see longitudinal plan of care) Patient Self Care Activities:  . Patient will focus on medication adherence by continuing current medication management.  Initial goal documentation.       Diabetes   Recent Relevant Labs: Lab Results  Component Value Date/Time   HGBA1C 6.8 (H) 11/21/2018 01:21 PM   HGBA1C 6.1 (A) 12/13/2017 01:30 PM   HGBA1C 7.1 06/22/2017 01:29 PM   HGBA1C 7.3 (H) 09/08/2016 10:39 AM   MICROALBUR <0.7 11/21/2018 01:21 PM   MICROALBUR 0.6 08/15/2017 03:33 PM   Lab Results  Component Value Date/Time   HMDIABEYEEXA No Retinopathy 07/16/2018 12:00 AM     A1c consistently 6.8-7.3% last 2018-2020. Patient is currently controlled on the following medications: sitagliptan 100 mg daily. Currently walking daily and doing yoga, participates in [employer]. Diet consists of many leafy greens.   We discussed: diet and exercise extensively.  Plan Continue current medications.   Hyperlipidemia   Lipid Panel     Component Value Date/Time   CHOL 121 05/20/2019 1125   TRIG 144.0 05/20/2019 1125   HDL 35.40 (L) 05/20/2019 1125   CHOLHDL 3 05/20/2019 1125   VLDL 28.8 05/20/2019 1125   LDLCALC 57 05/20/2019 1125   LDLCALC 70 08/15/2017  1533   LDLDIRECT 131.0 02/29/2016 1055    The ASCVD Risk score (Goff DC Jr., et al., 2013) failed to calculate for the following reasons:   The 2013 ASCVD risk score is only valid for ages 32 to 11   Patient is currently controlled on the following medications:   Fenofibrate 145 mg daily  Atorvastatin 20 mg daily  Lipids well controlled from 05/2019 panel. Denies any muscle or abdominal pain or n/v. We discussed:  diet and exercise extensively.  Note diet/exercise in DM a/p.   Plan Continue current medications and control with diet and exercise.  GERD  Currently controlled on famotidine  40 mg twice daily.  Patient denies dysphagia, heartburn or nausea. Denies any side effects or needing to avoid specific triggers at this time.  Plan  Continue current medication.  Depression/anxiety  Previously on venlafaxine, which caused GI distress. Mentions gluten filler causing the problem.  Patient is currently controlled on citalopram  40 mg daily. Reports continued benefit with current regimen. Denies any side effects at this time.  Plan Continue current medication.   Allergies  Patient is currently  controlled on the following medications:   Montelukast 10 mg daily   Cetirizine 10 mg daily   Has previously received allergy shots and reports this helping tremendously. No current symptoms at this time. Examples of previous triggers are Clorox spray and cat hair.   Plan Continue current medications  Pain - right shoulder  Patient is currently controlled on the following medications: chronic use of ibuprofen 400 mg twice daily. We discussed risk benefits of NSAIDs. Patient will try to take 200 mg twice daily moving forward. We discussed trying this first then adding topical Voltaren if needed.   Will be getting steroid shot about three times a year moving forward. Has not decided on 2nd shoulder replacement at this time.   Plan Decrease ibuprofen dose to 200 mg twice daily and add  on voltaren topical gel as needed.  Vaccines  Reviewed and discussed patient's vaccination history. Is up to date, next vaccine Tdap 2023.   Immunization History  Administered Date(s) Administered  . Influenza Split 04/11/2011  . Influenza, High Dose Seasonal PF 02/21/2019  . Influenza,inj,Quad PF,6+ Mos 04/10/2013, 03/10/2014, 04/20/2015, 02/29/2016, 03/20/2017, 02/06/2018  . Pneumococcal Conjugate-13 08/27/2014  . Pneumococcal Polysaccharide-23 04/19/2010  . Tdap 04/20/2012  . Zoster Recombinat (Shingrix) 04/15/2018, 07/15/2018   Plan No recommendation at this time.   Medication Management  Pt uses [facility] for all medications. Uses pill box? Yes.  Plan Continue current medication management strategy.  Follow up: 3 month phone visit. ______________ Visit Information SDOH (Social Determinants of Health) assessments performed:  Yes.   Food Insecurity: No Food Insecurity  . Worried About Charity fundraiser in the Last Year: Never true  . Ran Out of Food in the Last Year: Never true     Transportation Needs: No Transportation Needs  . Lack of Transportation (Medical): No  . Lack of Transportation (Non-Medical): No   [name] was given information about Chronic Care Management services today including:  1. CCM service includes personalized support from designated clinical staff supervised by her physician, including individualized plan of care and coordination with other care providers 2. 24/7 contact phone numbers for assistance for urgent and routine care needs. 3. Standard insurance, coinsurance, copays and deductibles apply for chronic care management only during months in which we provide at least 20 minutes of these services. Most insurances cover these services at 100%, however patients may be responsible for any copay, coinsurance and/or deductible if applicable. This service may help you avoid the need for more expensive face-to-face  services. 4. Only one practitioner may furnish and bill the service in a calendar month. 5. The patient may stop CCM services at any time (effective at the end of the month) by phone call to the office staff.  Patient agreed to services and verbal consent obtained.   Madelin Rear, Pharm.D. Clinical Pharmacist [facility] [phone]

## 2019-10-30 DIAGNOSIS — M25512 Pain in left shoulder: Secondary | ICD-10-CM | POA: Diagnosis not present

## 2019-11-03 DIAGNOSIS — W1830XA Fall on same level, unspecified, initial encounter: Secondary | ICD-10-CM | POA: Diagnosis not present

## 2019-11-03 DIAGNOSIS — S7011XA Contusion of right thigh, initial encounter: Secondary | ICD-10-CM | POA: Diagnosis not present

## 2019-11-04 ENCOUNTER — Encounter: Payer: Self-pay | Admitting: Family Medicine

## 2019-11-04 DIAGNOSIS — H903 Sensorineural hearing loss, bilateral: Secondary | ICD-10-CM | POA: Diagnosis not present

## 2019-11-10 ENCOUNTER — Ambulatory Visit (INDEPENDENT_AMBULATORY_CARE_PROVIDER_SITE_OTHER): Payer: Medicare Other | Admitting: Family Medicine

## 2019-11-10 ENCOUNTER — Encounter: Payer: Self-pay | Admitting: Family Medicine

## 2019-11-10 ENCOUNTER — Other Ambulatory Visit: Payer: Self-pay

## 2019-11-10 VITALS — BP 124/81 | HR 81 | Temp 98.4°F | Resp 16 | Wt 161.4 lb

## 2019-11-10 DIAGNOSIS — M545 Low back pain, unspecified: Secondary | ICD-10-CM

## 2019-11-10 DIAGNOSIS — M533 Sacrococcygeal disorders, not elsewhere classified: Secondary | ICD-10-CM

## 2019-11-10 LAB — POCT URINALYSIS DIPSTICK
Bilirubin, UA: NEGATIVE
Blood, UA: NEGATIVE
Glucose, UA: NEGATIVE
Ketones, UA: NEGATIVE
Leukocytes, UA: NEGATIVE
Nitrite, UA: NEGATIVE
Protein, UA: NEGATIVE
Spec Grav, UA: 1.025 (ref 1.010–1.025)
Urobilinogen, UA: 0.2 E.U./dL
pH, UA: 5 (ref 5.0–8.0)

## 2019-11-10 MED ORDER — MELOXICAM 7.5 MG PO TABS: 7.5000 mg | ORAL_TABLET | Freq: Every day | ORAL | 0 refills | Status: DC

## 2019-11-10 NOTE — Patient Instructions (Signed)
Call and schedule a follow up w/ Dr Posey Pronto for the diabetes START the Meloxicam once daily for 7-10 days.  Take w/ food. Continue the Acetaminophen twice daily for pain Try heat for the lower back Schedule PT to help improve the tightness in the area Call with any questions or concerns Hang in there!

## 2019-11-10 NOTE — Progress Notes (Signed)
   Subjective:    Patient ID: [name], female    DOB: 06-09-1941, 79 y.o.   MRN: [id number]  HPI Low back pain- pt reports difficulty straightening up from a sitting position.  Pain extends across low back.  No improvement w/ leaning forward.  No radiation into butt or thighs.  No burning w/ urination, frequency, urgency.  Pt states 'it's been coming on for awhile'.  Has been taking Acetaminophen BID.   Review of Systems For ROS see HPI   This visit occurred during the SARS-CoV-2 public health emergency.  Safety protocols were in place, including screening questions prior to the visit, additional usage of staff PPE, and extensive cleaning of exam room while observing appropriate contact time as indicated for disinfecting solutions.       Objective:   Physical Exam Vitals reviewed.  Constitutional:      General: She is not in acute distress.    Appearance: Normal appearance.  HENT:     Head: Normocephalic and atraumatic.  Cardiovascular:     Pulses: Normal pulses.  Musculoskeletal:        General: Tenderness (TTP over SI joints bilaterally) present. No swelling or deformity.     Comments: Pain w/ rising from seated position, back extension > flexion  Skin:    General: Skin is warm.     Findings: Bruising (large hematoma of R posterior thigh s/p fall 4 weeks ago) present. No erythema or rash.  Neurological:     General: No focal deficit present.     Mental Status: She is alert and oriented to person, place, and time.     Motor: No weakness.     Coordination: Coordination normal.     Gait: Gait normal.     Deep Tendon Reflexes: Reflexes normal.           Assessment & Plan:  SI joint pain- new.  Pt has TTP over SI joints bilaterally, most likely arthritic change.  Will start short term Meloxicam (7-10 days) and refer to PT.  Will avoid prednisone as this tends to make pt almost manic.  Will follow closely.  Pt expressed understanding and is in agreement w/ plan.

## 2019-11-13 ENCOUNTER — Telehealth: Payer: Self-pay | Admitting: Family Medicine

## 2019-11-13 NOTE — Telephone Encounter (Signed)
Pt was told at time of visit that urine was normal.  In regards to her leg cramps, she needs to make sure she is drinking plenty of water, eating foods rich in potassium (leafy greens, citrus fruits, bananas) and if still having cramps she can drink some tonic water before bed (a natural source of quinine)

## 2019-11-13 NOTE — Telephone Encounter (Signed)
Patient notified of PCP recommendations and is agreement and expresses an understanding.  

## 2019-11-13 NOTE — Telephone Encounter (Signed)
Mrs. [name] called and would like to know her results from her urinalysis this week.  - She also states that she is having leg cramps at night - every night - and would like to know what she can do about these.  Please give her a call.  Thanks!  Judson Roch

## 2019-11-13 NOTE — Telephone Encounter (Signed)
Please advise, pt had POCT dip in office. Typically pt is informed at time of visit.

## 2019-11-24 DIAGNOSIS — E042 Nontoxic multinodular goiter: Secondary | ICD-10-CM | POA: Diagnosis not present

## 2019-11-28 ENCOUNTER — Telehealth: Payer: Self-pay | Admitting: Family Medicine

## 2019-11-28 DIAGNOSIS — R918 Other nonspecific abnormal finding of lung field: Secondary | ICD-10-CM | POA: Diagnosis not present

## 2019-11-28 DIAGNOSIS — R911 Solitary pulmonary nodule: Secondary | ICD-10-CM | POA: Diagnosis not present

## 2019-11-28 DIAGNOSIS — J42 Unspecified chronic bronchitis: Secondary | ICD-10-CM | POA: Diagnosis not present

## 2019-11-28 DIAGNOSIS — J4540 Moderate persistent asthma, uncomplicated: Secondary | ICD-10-CM | POA: Diagnosis not present

## 2019-11-28 NOTE — Telephone Encounter (Signed)
Paperwork given to PCP for review.  

## 2019-11-28 NOTE — Telephone Encounter (Signed)
Form placed in Dr. Virgil Benedict basket up front

## 2019-12-03 NOTE — Telephone Encounter (Signed)
Picked up from the back faxed and sent to scan

## 2019-12-11 ENCOUNTER — Telehealth: Payer: Self-pay | Admitting: Family Medicine

## 2019-12-11 DIAGNOSIS — I8393 Asymptomatic varicose veins of bilateral lower extremities: Secondary | ICD-10-CM

## 2019-12-11 NOTE — Telephone Encounter (Signed)
Referral placed.

## 2019-12-11 NOTE — Telephone Encounter (Signed)
Please advise 

## 2019-12-11 NOTE — Telephone Encounter (Signed)
Would like to be referred to someone that can fix the veins in her legs - Please advise

## 2019-12-11 NOTE — Telephone Encounter (Signed)
[facility] for referral to vascular for varicose veins

## 2019-12-15 DIAGNOSIS — M542 Cervicalgia: Secondary | ICD-10-CM | POA: Diagnosis not present

## 2019-12-15 DIAGNOSIS — M546 Pain in thoracic spine: Secondary | ICD-10-CM | POA: Diagnosis not present

## 2019-12-15 DIAGNOSIS — M9905 Segmental and somatic dysfunction of pelvic region: Secondary | ICD-10-CM | POA: Diagnosis not present

## 2019-12-15 DIAGNOSIS — M9901 Segmental and somatic dysfunction of cervical region: Secondary | ICD-10-CM | POA: Diagnosis not present

## 2019-12-15 DIAGNOSIS — M5126 Other intervertebral disc displacement, lumbar region: Secondary | ICD-10-CM | POA: Diagnosis not present

## 2019-12-15 DIAGNOSIS — M9902 Segmental and somatic dysfunction of thoracic region: Secondary | ICD-10-CM | POA: Diagnosis not present

## 2019-12-15 DIAGNOSIS — M9903 Segmental and somatic dysfunction of lumbar region: Secondary | ICD-10-CM | POA: Diagnosis not present

## 2019-12-15 DIAGNOSIS — M545 Low back pain: Secondary | ICD-10-CM | POA: Diagnosis not present

## 2019-12-17 DIAGNOSIS — M546 Pain in thoracic spine: Secondary | ICD-10-CM | POA: Diagnosis not present

## 2019-12-17 DIAGNOSIS — M545 Low back pain: Secondary | ICD-10-CM | POA: Diagnosis not present

## 2019-12-17 DIAGNOSIS — M9901 Segmental and somatic dysfunction of cervical region: Secondary | ICD-10-CM | POA: Diagnosis not present

## 2019-12-17 DIAGNOSIS — M542 Cervicalgia: Secondary | ICD-10-CM | POA: Diagnosis not present

## 2019-12-17 DIAGNOSIS — M9902 Segmental and somatic dysfunction of thoracic region: Secondary | ICD-10-CM | POA: Diagnosis not present

## 2019-12-17 DIAGNOSIS — M5126 Other intervertebral disc displacement, lumbar region: Secondary | ICD-10-CM | POA: Diagnosis not present

## 2019-12-17 DIAGNOSIS — E041 Nontoxic single thyroid nodule: Secondary | ICD-10-CM | POA: Diagnosis not present

## 2019-12-17 DIAGNOSIS — M9905 Segmental and somatic dysfunction of pelvic region: Secondary | ICD-10-CM | POA: Diagnosis not present

## 2019-12-17 DIAGNOSIS — M9903 Segmental and somatic dysfunction of lumbar region: Secondary | ICD-10-CM | POA: Diagnosis not present

## 2019-12-23 DIAGNOSIS — M545 Low back pain: Secondary | ICD-10-CM | POA: Diagnosis not present

## 2019-12-23 DIAGNOSIS — M9902 Segmental and somatic dysfunction of thoracic region: Secondary | ICD-10-CM | POA: Diagnosis not present

## 2019-12-23 DIAGNOSIS — M542 Cervicalgia: Secondary | ICD-10-CM | POA: Diagnosis not present

## 2019-12-23 DIAGNOSIS — M5126 Other intervertebral disc displacement, lumbar region: Secondary | ICD-10-CM | POA: Diagnosis not present

## 2019-12-23 DIAGNOSIS — M9901 Segmental and somatic dysfunction of cervical region: Secondary | ICD-10-CM | POA: Diagnosis not present

## 2019-12-23 DIAGNOSIS — M546 Pain in thoracic spine: Secondary | ICD-10-CM | POA: Diagnosis not present

## 2019-12-23 DIAGNOSIS — M9903 Segmental and somatic dysfunction of lumbar region: Secondary | ICD-10-CM | POA: Diagnosis not present

## 2019-12-23 DIAGNOSIS — M9905 Segmental and somatic dysfunction of pelvic region: Secondary | ICD-10-CM | POA: Diagnosis not present

## 2019-12-30 DIAGNOSIS — M9905 Segmental and somatic dysfunction of pelvic region: Secondary | ICD-10-CM | POA: Diagnosis not present

## 2019-12-30 DIAGNOSIS — M9901 Segmental and somatic dysfunction of cervical region: Secondary | ICD-10-CM | POA: Diagnosis not present

## 2019-12-30 DIAGNOSIS — M9903 Segmental and somatic dysfunction of lumbar region: Secondary | ICD-10-CM | POA: Diagnosis not present

## 2019-12-30 DIAGNOSIS — M9902 Segmental and somatic dysfunction of thoracic region: Secondary | ICD-10-CM | POA: Diagnosis not present

## 2019-12-30 DIAGNOSIS — M546 Pain in thoracic spine: Secondary | ICD-10-CM | POA: Diagnosis not present

## 2019-12-30 DIAGNOSIS — M5126 Other intervertebral disc displacement, lumbar region: Secondary | ICD-10-CM | POA: Diagnosis not present

## 2019-12-30 DIAGNOSIS — M545 Low back pain: Secondary | ICD-10-CM | POA: Diagnosis not present

## 2019-12-30 DIAGNOSIS — M542 Cervicalgia: Secondary | ICD-10-CM | POA: Diagnosis not present

## 2020-01-01 ENCOUNTER — Telehealth: Payer: Self-pay | Admitting: Family Medicine

## 2020-01-01 NOTE — Telephone Encounter (Signed)
[name] called complaining with diarrhea for the past few years.  She does not want to go back to her GI doctor because she said that they didn't help her.  She wants to know what Dr. Birdie Riddle suggests.  Please advise patient.

## 2020-01-01 NOTE — Telephone Encounter (Signed)
Please advise 

## 2020-01-01 NOTE — Telephone Encounter (Signed)
According to the GI note from Feb she is to take Immodium as needed and to call GI if sxs again worsen.  If she doesn't feel that her current GI is helpful, we can try and get a second opinion.

## 2020-01-01 NOTE — Telephone Encounter (Signed)
Called and spoke with pt. She advised that she will try immodium and get back in regards to how to proceed with GI.

## 2020-01-05 ENCOUNTER — Encounter: Payer: Self-pay | Admitting: Physician Assistant

## 2020-01-05 ENCOUNTER — Telehealth (INDEPENDENT_AMBULATORY_CARE_PROVIDER_SITE_OTHER): Payer: Medicare Other | Admitting: Physician Assistant

## 2020-01-05 ENCOUNTER — Other Ambulatory Visit: Payer: Self-pay

## 2020-01-05 DIAGNOSIS — J069 Acute upper respiratory infection, unspecified: Secondary | ICD-10-CM

## 2020-01-05 MED ORDER — SACCHAROMYCES BOULARDII 250 MG PO CAPS: 250.0000 mg | ORAL_CAPSULE | Freq: Two times a day (BID) | ORAL | 0 refills | Status: DC

## 2020-01-05 NOTE — Progress Notes (Signed)
Virtual Visit via Telephone Note  I connected with [name] on 01/05/20 at  2:30 PM EDT by telephone and verified that I am speaking with the correct person using two identifiers.  Location: Patient: Home Provider: [facility]   I discussed the limitations, risks, security and privacy concerns of performing an evaluation and management service by telephone and the availability of in person appointments. I also discussed with the patient that there may be a patient responsible charge related to this service. The patient expressed understanding and agreed to proceed.  History of Present Illness: Patient endorses 4 days of chills, fatigue, sore throat with hoarseness and decreased appetite. Denies fever but notes mild chills. Notes symptoms were the most severe on days 1 and 2. Symptoms steadily improving over the past 24 hours. Has been eating well today and tolerating without GI upset. Notes sore throat is gone as are her chills. Still with mild fatigue. Denies any nasal congestion, sinus pressure/pain, headache, chest congestion or SOB. Denies recent travel or sick contact. Has received her COVID vaccine.   Observations/Objective: No labored breathing.  Speech is clear and coherent with logical content.  Patient is alert and oriented at baseline.   Assessment and Plan: 1. Viral URI No alarm signs/symptoms present. Symptoms improving significantly over the past 24 hours. Is COVID vaccinated. Would have her continue to keep well-hydrated and get plenty of rest. Tylenol, salt-water gargles as needed.   Follow Up Instructions: I discussed the assessment and treatment plan with the patient. The patient was provided an opportunity to ask questions and all were answered. The patient agreed with the plan and demonstrated an understanding of the instructions.   The patient was advised to call back or seek an in-person evaluation if the symptoms worsen or if the condition fails  to improve as anticipated.  I provided 10 minutes of non-face-to-face time during this encounter.   Leeanne Rio, PA-C

## 2020-01-05 NOTE — Patient Instructions (Addendum)
Instructions sent to MyChart

## 2020-01-05 NOTE — Progress Notes (Signed)
I have discussed the procedure for the virtual visit with the patient who has given consent to proceed with assessment and treatment.   Rakesh Dutko S Myranda Pavone, CMA     

## 2020-01-06 ENCOUNTER — Telehealth: Payer: Self-pay | Admitting: Emergency Medicine

## 2020-01-06 DIAGNOSIS — J069 Acute upper respiratory infection, unspecified: Secondary | ICD-10-CM

## 2020-01-06 MED ORDER — AMOXICILLIN 875 MG PO TABS: 875.0000 mg | ORAL_TABLET | Freq: Two times a day (BID) | ORAL | 0 refills | Status: AC

## 2020-01-06 NOTE — Telephone Encounter (Signed)
[place] RECORD [facility] Patient Name: [name] Gender: Female DOB: Mar 31, 1941 Age: 79 Y 2 M 16 D Return Phone Number: [phone] (Primary) Address: City/State/Zip: [place] [address] Client  Primary [facility] - Cli Client Site [place] - Day Physician Dimple Nanas- MD Contact Type Call Who Is Calling Patient / Member / Family / Caregiver Call Type Triage / Clinical Relationship To Patient Self Return Phone Number [phone] (Primary) Chief Complaint Sore Throat Reason for Call Symptomatic / Request for [place] states that she thought she was getting better yesterday but today she is worse. She thought she had the flu. She states that she is having a difficult time speaking and she says she is having a lot of soreness and congestion in her throat. She states that she would like something prescribed if possible and sent to [facility] so that it can be delivered. Translation No Nurse Assessment Nurse: Raphael Gibney, RN, Vanita Ingles Date/Time (Eastern Time): 01/06/2020 7:46:09 AM Confirm and document reason for call. If symptomatic, describe symptoms. ---Caller states she has a slightly cough. She has lost her voice but can whisper. has a very sore throat. eyes are watery. Had chills 2 days ago and thought she was getting better. no fever. She is able to drink some fluids. Has the patient had close contact with a person known or suspected to have the novel coronavirus illness OR traveled / lives in area with major community spread (including international travel) in the last 14 days from the onset of symptoms? * If Asymptomatic, screen for exposure and travel within the last 14 days. ---No Does the patient have any new or worsening symptoms? ---Yes Will a triage be completed? ---Yes Related visit to physician within the last 2  weeks? ---No Does the PT have any chronic conditions? (i.e. diabetes, asthma, this includes High risk factors for pregnancy, etc.) ---Yes List chronic conditions. ---asthma; diabetes; Is this a behavioral health or substance abuse call? ---No

## 2020-01-06 NOTE — Telephone Encounter (Signed)
Spoke with patient and she has lost her voice. She is having sore throat and not feeling well She is drinking hot liquids and soup No nasal congestion, no fever or chills She is gargling with warm salt water  Please advise

## 2020-01-06 NOTE — Telephone Encounter (Addendum)
Called patient but unable to leave a message due to VM is full.  Rx for Amoxicillin 875 mg sent to the [facility]

## 2020-01-06 NOTE — Telephone Encounter (Signed)
Make sure she continues warm liquids, humidifier, salt water gargles and voice resting.  Giving double worsening I will start antibiotic -- Ok to send in Rx Amoxicillin 875 mg BID x 10 days. Take with food.

## 2020-01-06 NOTE — Addendum Note (Signed)
Addended by: Leonidas Romberg on: 01/06/2020 01:56 PM   Modules accepted: Orders

## 2020-01-07 NOTE — Telephone Encounter (Signed)
Patient has called back today.  I have given her [name] and [name]'s message.   Patient understood.   She was very thankful.

## 2020-01-09 DIAGNOSIS — J0110 Acute frontal sinusitis, unspecified: Secondary | ICD-10-CM | POA: Diagnosis not present

## 2020-01-15 ENCOUNTER — Other Ambulatory Visit: Payer: Self-pay

## 2020-01-15 DIAGNOSIS — I8393 Asymptomatic varicose veins of bilateral lower extremities: Secondary | ICD-10-CM

## 2020-01-15 DIAGNOSIS — M25512 Pain in left shoulder: Secondary | ICD-10-CM | POA: Diagnosis not present

## 2020-01-20 ENCOUNTER — Telehealth: Payer: Medicare Other

## 2020-01-20 DIAGNOSIS — M9901 Segmental and somatic dysfunction of cervical region: Secondary | ICD-10-CM | POA: Diagnosis not present

## 2020-01-20 DIAGNOSIS — M9903 Segmental and somatic dysfunction of lumbar region: Secondary | ICD-10-CM | POA: Diagnosis not present

## 2020-01-20 DIAGNOSIS — M545 Low back pain: Secondary | ICD-10-CM | POA: Diagnosis not present

## 2020-01-20 DIAGNOSIS — M542 Cervicalgia: Secondary | ICD-10-CM | POA: Diagnosis not present

## 2020-01-20 DIAGNOSIS — M9902 Segmental and somatic dysfunction of thoracic region: Secondary | ICD-10-CM | POA: Diagnosis not present

## 2020-01-20 DIAGNOSIS — M5126 Other intervertebral disc displacement, lumbar region: Secondary | ICD-10-CM | POA: Diagnosis not present

## 2020-01-20 DIAGNOSIS — M546 Pain in thoracic spine: Secondary | ICD-10-CM | POA: Diagnosis not present

## 2020-01-20 DIAGNOSIS — M9905 Segmental and somatic dysfunction of pelvic region: Secondary | ICD-10-CM | POA: Diagnosis not present

## 2020-01-21 ENCOUNTER — Other Ambulatory Visit: Payer: Self-pay | Admitting: Family Medicine

## 2020-01-23 ENCOUNTER — Ambulatory Visit: Payer: Medicare Other

## 2020-01-23 DIAGNOSIS — E119 Type 2 diabetes mellitus without complications: Secondary | ICD-10-CM

## 2020-01-23 DIAGNOSIS — F418 Other specified anxiety disorders: Secondary | ICD-10-CM

## 2020-01-23 DIAGNOSIS — E782 Mixed hyperlipidemia: Secondary | ICD-10-CM

## 2020-01-23 NOTE — Patient Instructions (Signed)
Please call me at [phone] (direct line) with any questions - thank you!  - Edyth Gunnels., Clinical Pharmacist  Goals Addressed            This Visit's Progress   . A1c <7%   On track    [place] (see longitudinal plan of care for additional care plan information)  Current Barriers:  . Diabetes: type 2 Lab Results  Component Value Date   HGBA1C 6.8 (H) 11/21/2018 .   Lab Results  Component Value Date   CREATININE 0.79 05/20/2019   CREATININE 0.82 02/03/2019   CREATININE 0.92 11/21/2018 .  Current antihyperglycemic regimen:  o Sitagliptan 100 mg daily . Denies hypoglycemic symptoms, including dizziness, lightheadedness, shaking, sweating . Cardiovascular risk reduction: o Current hyperlipidemia regimen: atorvastatin 20 mg daily, fenofibrate 145 mg daily.  o Reduce NSAID use Pharmacist Clinical Goal(s):  Marland Kitchen Over the next 180 days, patient will work with PharmD and primary care provider to address type 2 diabetes. o Maintain A1c <7% Interventions: . Comprehensive medication review performed, medication list updated in electronic medical record . Inter-disciplinary care team collaboration  . Reduce use of ibuprofen to 200 mg twice daily, adding on voltaren gel if needed Patient Self Care Activities:  . Patient will focus on medication adherence by continuing current medication management. . Patient will report any questions or concerns to provider  Initial goal documentation.     Marland Kitchen Anxiety/Depression: Minimize recurring symptoms   On track    CARE PLAN ENTRY Current Barriers:  . Chronic Disease Management support, education, and care coordination needs related to anxiety/depression . Current regimen o Citalopram 40 mg once daily Pharmacist Clinical Goal(s):  Marland Kitchen Over next 180 days: Minimize recurring symptoms of depression.  Interventions: . Comprehensive medication review performed. Patient Self Care Activities:  . Patient verbalizes understanding of plan to  continue medication as prescribed over next 180 days. Please call with any questions! Initial goal documentation.    . LDL Cholesterol <100   On track    CARE PLAN ENTRY Current Barriers:  . Current antihyperlipidemic regimen: o Atorvastatin 20 mg daily o Fenofibrate 145 mg daily . Most recent lipid panel:     Component Value Date/Time   CHOL 121 05/20/2019 1125   TRIG 144.0 05/20/2019 1125   HDL 35.40 (L) 05/20/2019 1125   CHOLHDL 3 05/20/2019 1125   VLDL 28.8 05/20/2019 1125   LDLCALC 57 05/20/2019 1125   LDLCALC 70 08/15/2017 1533   LDLDIRECT 131.0 02/29/2016 1055  Pharmacist Clinical Goal(s):  Marland Kitchen Over the next 180 days, patient will work with PharmD and providers towards optimized antihyperlipidemic therapy o Maintain LDL cholesterol < 100 Interventions: . Comprehensive medication review performed; medication list updated in electronic medical record.  [name] care team collaboration (see longitudinal plan of care) Patient Self Care Activities:  . Patient will focus on medication adherence by continuing current medication management.  Initial goal documentation.       The patient verbalized understanding of instructions provided today and agreed to receive a mailed copy of patient instruction and/or educational materials. Telephone follow up appointment with pharmacy team member scheduled for: See next appointment with "Care Management Staff" under "What's Next" below.   Madelin Rear, Pharm.D., BCGP Clinical Pharmacist [facility] [phone]  Diabetes Mellitus and Nutrition, Adult When you have diabetes (diabetes mellitus), it is very important to have healthy eating habits because your blood sugar (glucose) levels are greatly affected by what you eat and drink. Eating healthy foods  in the appropriate amounts, at about the same times every day, can help you:  Control your blood glucose.  Lower your risk of heart disease.  Improve your blood  pressure.  Reach or maintain a healthy weight. Every person with diabetes is different, and each person has different needs for a meal plan. Your health care provider may recommend that you work with a diet and nutrition specialist (dietitian) to make a meal plan that is best for you. Your meal plan may vary depending on factors such as:  The calories you need.  The medicines you take.  Your weight.  Your blood glucose, blood pressure, and cholesterol levels.  Your activity level.  Other health conditions you have, such as heart or kidney disease. How do carbohydrates affect me? Carbohydrates, also called carbs, affect your blood glucose level more than any other type of food. Eating carbs naturally raises the amount of glucose in your blood. Carb counting is a method for keeping track of how many carbs you eat. Counting carbs is important to keep your blood glucose at a healthy level, especially if you use insulin or take certain oral diabetes medicines. It is important to know how many carbs you can safely have in each meal. This is different for every person. Your dietitian can help you calculate how many carbs you should have at each meal and for each snack. Foods that contain carbs include:  Bread, cereal, rice, pasta, and crackers.  Potatoes and corn.  Peas, beans, and lentils.  Milk and yogurt.  Fruit and juice.  Desserts, such as cakes, cookies, ice cream, and candy. How does alcohol affect me? Alcohol can cause a sudden decrease in blood glucose (hypoglycemia), especially if you use insulin or take certain oral diabetes medicines. Hypoglycemia can be a life-threatening condition. Symptoms of hypoglycemia (sleepiness, dizziness, and confusion) are similar to symptoms of having too much alcohol. If your health care provider says that alcohol is safe for you, follow these guidelines:  Limit alcohol intake to no more than 1 drink per day for nonpregnant women and 2 drinks per  day for men. One drink equals 12 oz of beer, 5 oz of wine, or 1 oz of hard liquor.  Do not drink on an empty stomach.  Keep yourself hydrated with water, diet soda, or unsweetened iced tea.  Keep in mind that regular soda, juice, and other mixers may contain a lot of sugar and must be counted as carbs. What are tips for following this plan?  Reading food labels  Start by checking the serving size on the "Nutrition Facts" label of packaged foods and drinks. The amount of calories, carbs, fats, and other nutrients listed on the label is based on one serving of the item. Many items contain more than one serving per package.  Check the total grams (g) of carbs in one serving. You can calculate the number of servings of carbs in one serving by dividing the total carbs by 15. For example, if a food has 30 g of total carbs, it would be equal to 2 servings of carbs.  Check the number of grams (g) of saturated and trans fats in one serving. Choose foods that have low or no amount of these fats.  Check the number of milligrams (mg) of salt (sodium) in one serving. Most people should limit total sodium intake to less than 2,300 mg per day.  Always check the nutrition information of foods labeled as "low-fat" or "nonfat". These foods  may be higher in added sugar or refined carbs and should be avoided.  Talk to your dietitian to identify your daily goals for nutrients listed on the label. Shopping  Avoid buying canned, premade, or processed foods. These foods tend to be high in fat, sodium, and added sugar.  Shop around the outside edge of the grocery store. This includes fresh fruits and vegetables, bulk grains, fresh meats, and fresh dairy. Cooking  Use low-heat cooking methods, such as baking, instead of high-heat cooking methods like deep frying.  Cook using healthy oils, such as olive, canola, or sunflower oil.  Avoid cooking with butter, cream, or high-fat meats. Meal planning  Eat  meals and snacks regularly, preferably at the same times every day. Avoid going long periods of time without eating.  Eat foods high in fiber, such as fresh fruits, vegetables, beans, and whole grains. Talk to your dietitian about how many servings of carbs you can eat at each meal.  Eat 4-6 ounces (oz) of lean protein each day, such as lean meat, chicken, fish, eggs, or tofu. One oz of lean protein is equal to: ? 1 oz of meat, chicken, or fish. ? 1 egg. ?  cup of tofu.  Eat some foods each day that contain healthy fats, such as avocado, nuts, seeds, and fish. Lifestyle  Check your blood glucose regularly.  Exercise regularly as told by your health care provider. This may include: ? 150 minutes of moderate-intensity or vigorous-intensity exercise each week. This could be brisk walking, biking, or water aerobics. ? Stretching and doing strength exercises, such as yoga or weightlifting, at least 2 times a week.  Take medicines as told by your health care provider.  Do not use any products that contain nicotine or tobacco, such as cigarettes and e-cigarettes. If you need help quitting, ask your health care provider.  Work with a Social worker or diabetes educator to identify strategies to manage stress and any emotional and social challenges. Questions to ask a health care provider  Do I need to meet with a diabetes educator?  Do I need to meet with a dietitian?  What number can I call if I have questions?  When are the best times to check my blood glucose? Where to find more information:  American Diabetes Association: diabetes.org  Academy of Nutrition and Dietetics: www.eatright.[employer] of Diabetes and Digestive and Kidney Diseases (NIH): [employer] Summary  A healthy meal plan will help you control your blood glucose and maintain a healthy lifestyle.  Working with a diet and nutrition specialist (dietitian) can help you make a meal plan that is best for  you.  Keep in mind that carbohydrates (carbs) and alcohol have immediate effects on your blood glucose levels. It is important to count carbs and to use alcohol carefully. This information is not intended to replace advice given to you by your health care provider. Make sure you discuss any questions you have with your health care provider. Document Revised: 05/18/2017 Document Reviewed: 07/10/2016 Elsevier Patient Education  2020 [employer].

## 2020-01-23 NOTE — Progress Notes (Signed)
01/05/20   Chronic Care Management Pharmacy  Name: [name]  MRN: [id number] DOB: April 09, 1941  Chief Complaint/ HPI  [name],  79 y.o. , female presents for their Initial CCM visit with the clinical pharmacist via telephone due to COVID-19 Pandemic.  M/W/F walks now, is getting over sinus infection, denies any aches or pains at this moment. Reports that she is feeling great. Maintaining diet high in leafy greens, avoids sweets.  PCP : Midge Minium, MD  Office Visits: 11/07/2019 (PCP): lower back pain, schedule PT, f/u with Dr. Posey Pronto for diabetes. 08/11/2019 (PCP): UTI symptoms, given keflex 500 mg BID x days.  05/19/2020 (PCP): AWV. C/O worsening diarrhea. Start multivitamin. Discussed decreasing coffee intake, referred to GI.   Consult Visits: ---11/30/2020 (Dr. Camillo Flaming, pulm): upcoming ---03/09/2020 (Dr. Posey Pronto, endocrinology): upcoming 08/15/2019 (Dr. Cato Mulligan): Recent dgx and hx of C. Difficile. Had not been taking PO vanc correctly then restarted as directed and symptoms of loose stools improved. PRN Imodium recommended.   Their chronic conditions include: HLD, DM, GERD, depression/anxiety.  Patient Active Problem List   Diagnosis Date Noted   Chronic diarrhea 12/26/2017   S/P shoulder replacement, right 09/15/2016   Benign paroxysmal positional vertigo 10/14/2014   Back pain 08/27/2014   Physical exam 08/27/2014   Decreased hearing of both ears 05/19/2014   S/P cholecystectomy 10/02/2013   Asthma, moderate persistent 05/16/2013   H/O recurrent pneumonia 05/16/2013   Insomnia 05/05/2013   Gluten intolerance 05/06/2012   Renal calculus, left 01/29/2012   Painful bladder spasm 12/14/2011   Lactose intolerance 12/14/2011   Post-menopausal 12/14/2011   Microscopic colitis 11/08/2011   Hepatic steatosis 08/25/2011   Pulmonary nodule, right 08/25/2011   Hematuria 08/23/2011   Memory loss 12/12/2010   Diabetes mellitus type II,  controlled, with no complications ([place]) 40/98/1191   Hyperlipidemia 07/14/2010   Depression with anxiety 07/14/2010   GLAUCOMA 07/14/2010   ALLERGIC RHINITIS 07/14/2010   Past Surgical History:  Procedure Laterality Date   ABDOMINAL HYSTERECTOMY     Has ovaries   BACK SURGERY  1987   lumb   BREAST SURGERY Left 07/2016   bx- needle in breast   CHOLECYSTECTOMY  09/26/13   CLOSED REDUCTION NASAL FRACTURE  04/29/2012   Procedure: CLOSED REDUCTION NASAL FRACTURE;  Surgeon: Jodi Marble, MD;  Location: [place];  Service: ENT;  Laterality: N/A;  WITH STABILIZATION   COLONOSCOPY     Deviated septum repair     EYE SURGERY Bilateral    cataracts   LITHOTRIPSY     REVERSE SHOULDER ARTHROPLASTY Right 09/15/2016   Procedure: RIGHT REVERSE SHOULDER ARTHROPLASTY;  Surgeon: Netta Cedars, MD;  Location: [place];  Service: Orthopedics;  Laterality: Right;   ROTATOR CUFF REPAIR     right   TONSILLECTOMY     Social History   Socioeconomic History   Marital status: Widowed    Spouse name: Not on file   Number of children: 2   Years of education: Not on file   Highest education level: Not on file  Occupational History   Occupation: Retired -Sports coach: RETIRED  Tobacco Use   Smoking status: Former Smoker    Packs/day: 0.10    Years: 4.00    Pack years: 0.40    Types: Cigarettes    Quit date: 06/19/1986    Years since quitting: 33.6   Smokeless tobacco: Never Used   Tobacco comment: smoked in college years ago  63  Use   Vaping Use: Never used  Substance and Sexual Activity   Alcohol use: Yes    Comment: 1-2 glasses week variety beer occ scotch   Drug use: No   Sexual activity: Not Currently  Other Topics Concern   Not on file  Social History Narrative   Daily caffeine    Social Determinants of Health   Financial Resource Strain:    Difficulty of Paying Living Expenses:   Food Insecurity: No Food Insecurity    Worried About Running Out of Food in the Last Year: Never true   Ran Out of Food in the Last Year: Never true  Transportation Needs: No Transportation Needs   Lack of Transportation (Medical): No   Lack of Transportation (Non-Medical): No  Physical Activity:    Days of Exercise per Week:    Minutes of Exercise per Session:   Stress:    Feeling of Stress :   Social Connections:    Frequency of Communication with Friends and Family:    Frequency of Social Gatherings with Friends and Family:    Attends Religious Services:    Active Member of Clubs or Organizations:    Attends Music therapist:    Marital Status:    Family History  Problem Relation Age of Onset   Diabetes Father    Colon cancer Neg Hx    Esophageal cancer Neg Hx    Rectal cancer Neg Hx    Stomach cancer Neg Hx    Breast cancer Neg Hx    Allergies  Allergen Reactions   Papain Anaphylaxis    Takes an extreme concentration   Papaya Derivatives Anaphylaxis   Mushroom Ext Cmplx(Shiitake-Reishi-Mait) Other (See Comments)    Head congestion and sore throat   Mushroom Extract Complex Other (See Comments)    Head congestion and sore throat   Venlafaxine Other (See Comments)    UNSPECIFIED REACTION  Pt states she can take Name brand. Lethargy   Outpatient Encounter Medications as of 01/23/2020  Medication Sig Note   atorvastatin (LIPITOR) 20 MG tablet TAKE ONE (1) TABLET BY MOUTH EACH DAY    Biotin 5000 MCG CAPS Take 1 capsule by mouth daily.    cetirizine (ZYRTEC) 10 MG tablet Take by mouth.    citalopram (CELEXA) 40 MG tablet TAKE ONE (1) TABLET BY MOUTH EVERY DAY    famotidine (PEPCID) 40 MG tablet Take 2 tablets (80 mg total) by mouth at bedtime.    fenofibrate (TRICOR) 145 MG tablet TAKE ONE TABLET BY MOUTH EVERY MORNING AFTER BREAKFAST    ibuprofen (ADVIL) 200 MG tablet Take 200 mg by mouth 2 (two) times daily.     LUMIGAN 0.01 % SOLN Place 1 drop into both eyes at  bedtime.  10/14/2014: Received from: External Pharmacy   Melatonin 10 MG CAPS Take by mouth.    montelukast (SINGULAIR) 10 MG tablet Take 10 mg by mouth at bedtime.    Multiple Vitamins-Minerals (CENTRUM SILVER PO) Take 1 tablet by mouth daily.    saccharomyces boulardii (FLORASTOR) 250 MG capsule Take 1 capsule (250 mg total) by mouth 2 (two) times daily.    sitaGLIPtin (JANUVIA) 100 MG tablet Take 1 tablet (100 mg total) by mouth daily.    meloxicam (MOBIC) 7.5 MG tablet Take 1 tablet (7.5 mg total) by mouth daily. (Patient not taking: Reported on 01/23/2020)    No facility-administered encounter medications on file as of 01/23/2020.   Patient Care Team    Relationship Specialty  Notifications Start End  Midge Minium, MD PCP - General Family Medicine  12/08/10    Comment: Alice Rieger, AUD  Audiology  08/25/14   Netta Cedars, MD Consulting Physician Orthopedic Surgery  08/15/17   Hollar, Katharine Look, MD Referring Physician Dermatology  08/15/17   Milus Banister, MD Attending Physician Gastroenterology  11/23/17   Charlaine Dalton, MD Referring Physician Pulmonary Disease  10/22/18   Calvert Cantor, MD Consulting Physician Ophthalmology  10/22/18   Joycie Peek, MD  General Practice  10/22/18    Comment: Dentist  Amalia Greenhouse, MD Referring Physician Endocrinology  11/22/18   Kathie Rhodes, MD Consulting Physician Urology  02/18/19   Madelin Rear, [facility] Pharmacist Pharmacist Admissions 09/26/19    Comment: phone number [phone]    Current Diagnosis/Assessment: Goals Addressed            This Visit's Progress    A1c <7%   On track    [place] (see longitudinal plan of care for additional care plan information)  Current Barriers:   Diabetes: type 2 Lab Results  Component Value Date   HGBA1C 6.8 (H) 11/21/2018    Lab Results  Component Value Date   CREATININE 0.79 05/20/2019   CREATININE 0.82 02/03/2019   CREATININE 0.92 11/21/2018   Current  antihyperglycemic regimen:  o Sitagliptan 100 mg daily  Denies hypoglycemic symptoms, including dizziness, lightheadedness, shaking, sweating  Cardiovascular risk reduction: o Current hyperlipidemia regimen: atorvastatin 20 mg daily, fenofibrate 145 mg daily.  o Reduce NSAID use Pharmacist Clinical Goal(s):   Over the next 180 days, patient will work with PharmD and primary care provider to address type 2 diabetes. o Maintain A1c <7% Interventions:  Comprehensive medication review performed, medication list updated in electronic medical record  Inter-disciplinary care team collaboration   Reduce use of ibuprofen to 200 mg twice daily, adding on voltaren gel if needed Patient Self Care Activities:   Patient will focus on medication adherence by continuing current medication management.  Patient will report any questions or concerns to provider  Initial goal documentation.      Anxiety/Depression: Minimize recurring symptoms   On track    CARE PLAN ENTRY Current Barriers:   Chronic Disease Management support, education, and care coordination needs related to anxiety/depression  Current regimen o Citalopram 40 mg once daily Pharmacist Clinical Goal(s):   Over next 180 days: Minimize recurring symptoms of depression.  Interventions:  Comprehensive medication review performed. Patient Self Care Activities:   Patient verbalizes understanding of plan to continue medication as prescribed over next 180 days. Please call with any questions! Initial goal documentation.     LDL Cholesterol <100   On track    CARE PLAN ENTRY Current Barriers:   Current antihyperlipidemic regimen: o Atorvastatin 20 mg daily o Fenofibrate 145 mg daily  Most recent lipid panel:     Component Value Date/Time   CHOL 121 05/20/2019 1125   TRIG 144.0 05/20/2019 1125   HDL 35.40 (L) 05/20/2019 1125   CHOLHDL 3 05/20/2019 1125   VLDL 28.8 05/20/2019 1125   LDLCALC 57 05/20/2019 1125    LDLCALC 70 08/15/2017 1533   LDLDIRECT 131.0 02/29/2016 1055  Pharmacist Clinical Goal(s):   Over the next 180 days, patient will work with PharmD and providers towards optimized antihyperlipidemic therapy o Maintain LDL cholesterol < 100 Interventions:  Comprehensive medication review performed; medication list updated in electronic medical record.   Inter-disciplinary care team collaboration (see longitudinal plan of care)  Patient Self Care Activities:   Patient will focus on medication adherence by continuing current medication management.  Initial goal documentation.       Diabetes   a1c < 7%  Lab Results  Component Value Date/Time   HGBA1C 6.8 (H) 11/21/2018 01:21 PM   HGBA1C 6.1 (A) 12/13/2017 01:30 PM   HGBA1C 7.1 06/22/2017 01:29 PM   HGBA1C 7.3 (H) 09/08/2016 10:39 AM   MICROALBUR <0.7 11/21/2018 01:21 PM   MICROALBUR 0.6 08/15/2017 03:33 PM   Lab Results  Component Value Date/Time   HMDIABEYEEXA No Retinopathy 07/16/2018 12:00 AM     a1c well controlled. BG not provided. Upcoming appointment with Dr. Posey Pronto 02/2020. Diet consists of many leafy greens.  Patient is currently controlled on the following medications:   Januvia 100 mg once daily    We discussed: diet and exercise extensively.  Plan Continue current medications.   Hyperlipidemia   LDL goal < 100     Component Value Date/Time   CHOL 121 05/20/2019 1125   TRIG 144.0 05/20/2019 1125   HDL 35.40 (L) 05/20/2019 1125   CHOLHDL 3 05/20/2019 1125   VLDL 28.8 05/20/2019 1125   LDLCALC 57 05/20/2019 1125   LDLCALC 70 08/15/2017 1533   LDLDIRECT 131.0 02/29/2016 1055    LDL from 05/2019 57. Upcoming visit with Dr. Posey Pronto 02/2020. Patient is currently controlled on the following medications:   Fenofibrate 145 mg daily  Atorvastatin 20 mg daily  Plan  Continue current medications and control with diet and exercise.  GERD   Denies acid reflux, no reflux triggering foods. Controlled  on:  Famotidine  40 mg twice daily.  Plan  Continue current medication.  Depression/anxiety   PHQ9 SCORE ONLY 01/23/2020 11/10/2019 11/10/2019  PHQ-9 Total Score 0 0 0   Previously on venlafaxine, which caused GI distress. Mentions gluten filler causing the problem. Denies any side effects.  Patient is currently controlled on:  Citalopram  40 mg daily  Plan  Continue current medication.   Allergies   Patient is currently controlled on the following medications:   Montelukast 10 mg daily   Cetirizine 10 mg daily   Has previously received allergy shots and reports this helping tremendously. No current symptoms at this time. Examples of previous triggers are Clorox spray and cat hair.   Plan  Continue current medications  Pain - right shoulder   Previously using ibuprofen 400 mg twice daily. Now 200 mg twice daily for control of pain.   Plan  Continue current medications.  Vaccines   Reviewed. Is up to date, next vaccine Tdap 2023.   Immunization History  Administered Date(s) Administered   Influenza Split 04/11/2011   Influenza, High Dose Seasonal PF 02/21/2019   Influenza,inj,Quad PF,6+ Mos 04/10/2013, 03/10/2014, 04/20/2015, 02/29/2016, 03/20/2017, 02/06/2018   Moderna SARS-COVID-2 Vaccination 07/29/2019, 08/12/2019   Pneumococcal Conjugate-13 08/27/2014   Pneumococcal Polysaccharide-23 04/19/2010   Tdap 04/20/2012   Zoster Recombinat (Shingrix) 04/15/2018, 07/15/2018   Plan  No recommendation at this time.   Medication Management   Pt uses [facility] for all medications. Uses pill box? Yes.  Plan Continue current medication management strategy.  Follow up: 3 month phone visit. ______________ CCM Follow-Up  CPA: 2 month DM Call [place]: 3 month f/u visit (already scheduled)  Madelin Rear, Pharm.D. Clinical Pharmacist [facility] [phone]

## 2020-01-27 ENCOUNTER — Encounter: Payer: Self-pay | Admitting: Vascular Surgery

## 2020-01-27 ENCOUNTER — Ambulatory Visit (HOSPITAL_COMMUNITY)
Admission: RE | Admit: 2020-01-27 | Discharge: 2020-01-27 | Disposition: A | Discharge status: Home / Self Care | Payer: Medicare Other | Source: Ambulatory Visit | Attending: Surgery | Admitting: Surgery

## 2020-01-27 ENCOUNTER — Ambulatory Visit (INDEPENDENT_AMBULATORY_CARE_PROVIDER_SITE_OTHER): Payer: Medicare Other | Admitting: Vascular Surgery

## 2020-01-27 ENCOUNTER — Other Ambulatory Visit: Payer: Self-pay

## 2020-01-27 VITALS — BP 130/69 | HR 74 | Temp 97.7°F | Resp 18 | Ht 62.75 in | Wt 159.6 lb

## 2020-01-27 DIAGNOSIS — I8393 Asymptomatic varicose veins of bilateral lower extremities: Secondary | ICD-10-CM

## 2020-01-27 NOTE — Progress Notes (Signed)
[facility] [place]  Patient name: [name] MRN: [id number] DOB: 1940-11-08 Sex: female  REASON FOR CONSULT: Evaluation lower extremity pathology  HPI: [name] is a 79 y.o. female, who is here today for evaluation.  She is concerned regarding telangiectasia and small varicosities in both lower extremities.  She does not have any specific pain associated with this and no swelling.  I was concerned that this may be a sign of more serious complications regarding her circulation.  She has no history of peripheral vascular occlusive disease  Past Medical History:  Diagnosis Date  . Allergic rhinitis   . Asthma   . Diabetes mellitus    just changed from injection to metformin ? month  . Dysrhythmia    palpitations  . Glaucoma   . History of kidney stones   . Hyperlipemia   . Migraines   . Palpitations   . Pneumonia    hx  . Urinary tract infection     Family History  Problem Relation Age of Onset  . Diabetes Father   . Colon cancer Neg Hx   . Esophageal cancer Neg Hx   . Rectal cancer Neg Hx   . Stomach cancer Neg Hx   . Breast cancer Neg Hx     SOCIAL HISTORY: Social History   Socioeconomic History  . Marital status: Widowed    Spouse name: Not on file  . Number of children: 2  . Years of education: Not on file  . Highest education level: Not on file  Occupational History  . Occupation: Retired -Sports coach: RETIRED  Tobacco Use  . Smoking status: Former Smoker    Packs/day: 0.10    Years: 4.00    Pack years: 0.40    Types: Cigarettes    Quit date: 06/19/1986    Years since quitting: 33.6  . Smokeless tobacco: Never Used  . Tobacco comment: smoked in college years ago  Vaping Use  . Vaping Use: Never used  Substance and Sexual Activity  . Alcohol use: Yes    Comment: 1-2 glasses week variety beer occ scotch  . Drug use: No  . Sexual activity: Not Currently  Other Topics  Concern  . Not on file  Social History Narrative   Daily caffeine    Social Determinants of Health   Financial Resource Strain:   . Difficulty of Paying Living Expenses:   Food Insecurity: No Food Insecurity  . Worried About Charity fundraiser in the Last Year: Never true  . Ran Out of Food in the Last Year: Never true  Transportation Needs: No Transportation Needs  . Lack of Transportation (Medical): No  . Lack of Transportation (Non-Medical): No  Physical Activity:   . Days of Exercise per Week:   . Minutes of Exercise per Session:   Stress:   . Feeling of Stress :   Social Connections:   . Frequency of Communication with Friends and Family:   . Frequency of Social Gatherings with Friends and Family:   . Attends Religious Services:   . Active Member of Clubs or Organizations:   . Attends Archivist Meetings:   Marland Kitchen Marital Status:   Intimate Partner Violence:   . Fear of Current or Ex-Partner:   . Emotionally Abused:   Marland Kitchen Physically Abused:   . Sexually Abused:     Allergies  Allergen Reactions  . Papain Anaphylaxis    Takes an  extreme concentration  . Papaya Derivatives Anaphylaxis  . Mushroom Ext Cmplx(Shiitake-Reishi-Mait) Other (See Comments)    Head congestion and sore throat  . Mushroom Extract Complex Other (See Comments)    Head congestion and sore throat  . Venlafaxine Other (See Comments)    UNSPECIFIED REACTION  Pt states she can take Name brand. Lethargy    Current Outpatient Medications  Medication Sig Dispense Refill  . atorvastatin (LIPITOR) 20 MG tablet TAKE ONE (1) TABLET BY MOUTH EACH DAY 90 tablet 1  . Biotin 5000 MCG CAPS Take 1 capsule by mouth daily.    . cetirizine (ZYRTEC) 10 MG tablet Take by mouth.    . citalopram (CELEXA) 40 MG tablet TAKE ONE (1) TABLET BY MOUTH EVERY DAY 90 tablet 1  . famotidine (PEPCID) 40 MG tablet Take 2 tablets (80 mg total) by mouth at bedtime. 180 tablet 1  . fenofibrate (TRICOR) 145 MG tablet TAKE  ONE TABLET BY MOUTH EVERY MORNING AFTER BREAKFAST 30 tablet 6  . ibuprofen (ADVIL) 200 MG tablet Take 200 mg by mouth 2 (two) times daily.     Marland Kitchen LUMIGAN 0.01 % SOLN Place 1 drop into both eyes at bedtime.     . Melatonin 10 MG CAPS Take by mouth.    . montelukast (SINGULAIR) 10 MG tablet Take 10 mg by mouth at bedtime.    . Multiple Vitamins-Minerals (CENTRUM SILVER PO) Take 1 tablet by mouth daily.    . sitaGLIPtin (JANUVIA) 100 MG tablet Take 1 tablet (100 mg total) by mouth daily. 90 tablet 0  . meloxicam (MOBIC) 7.5 MG tablet Take 1 tablet (7.5 mg total) by mouth daily. (Patient not taking: Reported on 01/27/2020) 15 tablet 0  . saccharomyces boulardii (FLORASTOR) 250 MG capsule Take 1 capsule (250 mg total) by mouth 2 (two) times daily. (Patient not taking: Reported on 01/27/2020) 180 capsule 0   No current facility-administered medications for this visit.    REVIEW OF SYSTEMS:  [X]  denotes positive finding, [ ]  denotes negative finding Cardiac  Comments:  Chest pain or chest pressure:    Shortness of breath upon exertion:    Short of breath when lying flat:    Irregular heart rhythm:        Vascular    Pain in calf, thigh, or hip brought on by ambulation:    Pain in feet at night that wakes you up from your sleep:     Blood clot in your veins:    Leg swelling:         Pulmonary    Oxygen at home:    Productive cough:     Wheezing:         Neurologic    Sudden weakness in arms or legs:     Sudden numbness in arms or legs:     Sudden onset of difficulty speaking or slurred speech:    Temporary loss of vision in one eye:     Problems with dizziness:         Gastrointestinal    Blood in stool:     Vomited blood:         Genitourinary    Burning when urinating:     Blood in urine:        Psychiatric    Major depression:         Hematologic    Bleeding problems:    Problems with blood clotting too easily:        Skin  Rashes or ulcers:        Constitutional      Fever or chills:      PHYSICAL EXAM: Vitals:   01/27/20 1409  BP: 130/69  Pulse: 74  Resp: 18  Temp: 97.7 F (36.5 C)  TempSrc: Temporal  SpO2: 98%  Weight: 159 lb 9.6 oz (72.4 kg)  Height: 5' 2.75" (1.594 m)    GENERAL: The patient is a well-nourished female, in no acute distress. The vital signs are documented above. CARDIOVASCULAR: 2+ dorsalis pedis pulses bilaterally.  Scattered telangiectasia over both lower extremities. PULMONARY: There is good air exchange  ABDOMEN: Soft and non-tender  MUSCULOSKELETAL: There are no major deformities or cyanosis. NEUROLOGIC: No focal weakness or paresthesias are detected. SKIN: There are no ulcers or rashes noted. PSYCHIATRIC: The patient has a normal affect.  DATA:  Noninvasive studies showed no evidence of DVT and no significant reflux.  She does have some reflux at the saphenofemoral junction.  MEDICAL ISSUES: I long discussion with the patient regarding this.  She has no evidence of arterial or severe venous pathology.  She is a concern regarding the appearance of her telangiectasia.  I did explain that these could be addressed with sclerotherapy and discussed the technique of this.  She is interested in pursuing this.  I explained that this is best handled in the fall or winter since she will be required to wear compression garments following the procedure.  She understands this and will contact our office when she wishes to proceed.  She understands that this would be none covered expense since it is cosmetic.   Rosetta Posner, MD FACS [facility] Tel [phone] Pager [phone]

## 2020-03-09 ENCOUNTER — Telehealth: Payer: Self-pay | Admitting: Family Medicine

## 2020-03-09 DIAGNOSIS — E041 Nontoxic single thyroid nodule: Secondary | ICD-10-CM | POA: Diagnosis not present

## 2020-03-09 DIAGNOSIS — E782 Mixed hyperlipidemia: Secondary | ICD-10-CM | POA: Diagnosis not present

## 2020-03-09 DIAGNOSIS — E119 Type 2 diabetes mellitus without complications: Secondary | ICD-10-CM | POA: Diagnosis not present

## 2020-03-09 LAB — HEMOGLOBIN A1C: Hemoglobin A1C: 6.5

## 2020-03-09 MED ORDER — MONTELUKAST SODIUM 10 MG PO TABS: 10.0000 mg | ORAL_TABLET | Freq: Every day | ORAL | 3 refills | Status: DC

## 2020-03-09 NOTE — Telephone Encounter (Signed)
[name] is requesting a prescription for Montelukast sodium  10 mg.  She was given a rx last year by the attending at [facility] where she lives with a years worth of refills.  She just finished it a few days ago but realizes that she is hoarse and coughing without it.  She would like to know if you would send her a prescription in to [facility] in [place].  She states that she is feeling good, just concerned with the hoarseness and coughing.  Patient states that you can leave her a message letting her know your decision.  She will be at another doctors appointment this afternoon

## 2020-03-09 NOTE — Telephone Encounter (Signed)
Medication filled to pharmacy as requested. Left a detailed message to inform of PCP request for VV if symptoms do not improve.

## 2020-03-09 NOTE — Telephone Encounter (Signed)
Ok for Singulair 10mg  nightly, #90, 3 refills.  HOWEVER, if cough and respiratory symptoms don't improve, she will need a virtual visit to assess

## 2020-03-09 NOTE — Telephone Encounter (Signed)
Please advise 

## 2020-03-17 ENCOUNTER — Telehealth: Payer: Self-pay | Admitting: Family Medicine

## 2020-03-17 NOTE — Telephone Encounter (Signed)
Please advise 

## 2020-03-17 NOTE — Telephone Encounter (Signed)
Patient wants to know your opinion on if she should get an covid antibody test before she takes the booster shot (3rd shot) - Patient also wants Dr. Virgil Benedict opinion on going to have the "fat melted from her gut" - patients exact words.  She states she is walking 6 to 7 miles a week and that it is not helping the problem.  She would like to be able to wear a belt and be able to tuck her shirts in her pants.  If Dr. Birdie Riddle is okay with her having this done she would like to know who she would recommend for this procedure.  Please advise

## 2020-03-17 NOTE — Telephone Encounter (Signed)
Patient notified of PCP recommendations and is agreement and expresses an understanding.  

## 2020-03-17 NOTE — Telephone Encounter (Signed)
Cosmetic procedures tend to become more risky and take longer to heal/recover from as we age.  I would not recommend an unnecessary procedure.  But I love to hear that she's walking regularly!

## 2020-03-18 ENCOUNTER — Other Ambulatory Visit: Payer: Self-pay | Admitting: Family Medicine

## 2020-03-19 DIAGNOSIS — Z23 Encounter for immunization: Secondary | ICD-10-CM | POA: Diagnosis not present

## 2020-03-23 ENCOUNTER — Telehealth: Payer: Self-pay | Admitting: Family Medicine

## 2020-03-23 NOTE — Telephone Encounter (Signed)
Please advise 

## 2020-03-23 NOTE — Telephone Encounter (Signed)
Coffee can definitely contribute to loose stools.  Especially if she is drinking a lot of it.  She should try cutting down on this.  As long as she is eating diet full of fiber- fruits/veggies, whole grains- and limiting her dairy intake, she shouldn't need to make any other dietary changes

## 2020-03-23 NOTE — Telephone Encounter (Signed)
Patient called concerned with the diarrhea that she has had continually for about 3 years.   The gastroenterologist that she has seen (both of them) have never done anything to help.  She would like to know your thoughts on this  - should she quit drinking coffee?  Is it because of her age? Should she consider changing her diet? , etc? - Please advise

## 2020-03-23 NOTE — Telephone Encounter (Signed)
Called and spoke with pt. She advised that she drinks 4 cups of coffee in the morning, and had cut out ALL fiber and greens from her diet. Discussed the importance of fiber and stool forming foods with patient. Also advised to increase her water intake as when she begins to increase these leafy greens again she will develop urine odor and I do not want her to think she has a UTI. Pt stated an understanding.

## 2020-03-25 DIAGNOSIS — H35373 Puckering of macula, bilateral: Secondary | ICD-10-CM | POA: Diagnosis not present

## 2020-03-25 DIAGNOSIS — H43813 Vitreous degeneration, bilateral: Secondary | ICD-10-CM | POA: Diagnosis not present

## 2020-03-25 DIAGNOSIS — Z961 Presence of intraocular lens: Secondary | ICD-10-CM | POA: Diagnosis not present

## 2020-03-25 DIAGNOSIS — H401131 Primary open-angle glaucoma, bilateral, mild stage: Secondary | ICD-10-CM | POA: Diagnosis not present

## 2020-03-25 DIAGNOSIS — E119 Type 2 diabetes mellitus without complications: Secondary | ICD-10-CM | POA: Diagnosis not present

## 2020-04-07 ENCOUNTER — Encounter: Payer: Self-pay | Admitting: Family Medicine

## 2020-04-07 ENCOUNTER — Ambulatory Visit (INDEPENDENT_AMBULATORY_CARE_PROVIDER_SITE_OTHER): Payer: Medicare Other | Admitting: Family Medicine

## 2020-04-07 ENCOUNTER — Other Ambulatory Visit: Payer: Self-pay

## 2020-04-07 VITALS — BP 122/54 | HR 76 | Temp 97.5°F | Resp 20 | Ht 62.76 in | Wt 165.0 lb

## 2020-04-07 DIAGNOSIS — N898 Other specified noninflammatory disorders of vagina: Secondary | ICD-10-CM

## 2020-04-07 DIAGNOSIS — E119 Type 2 diabetes mellitus without complications: Secondary | ICD-10-CM | POA: Diagnosis not present

## 2020-04-07 LAB — MICROALBUMIN / CREATININE URINE RATIO
Creatinine,U: 50.1 mg/dL
Microalb Creat Ratio: 1.4 mg/g (ref 0.0–30.0)
Microalb, Ur: <0.7 mg/dL (ref 0.0–1.9)

## 2020-04-07 MED ORDER — NYSTATIN 100000 UNIT/GM EX POWD
1.0000 | Freq: Three times a day (TID) | CUTANEOUS | 1 refills | Status: DC
Start: 2020-04-07 — End: 2020-08-20

## 2020-04-07 MED ORDER — NYSTATIN 100000 UNIT/GM EX OINT: 1.0000 "application " | TOPICAL_OINTMENT | Freq: Two times a day (BID) | CUTANEOUS | 1 refills | Status: DC

## 2020-04-07 NOTE — Patient Instructions (Signed)
Follow up in 3 months to recheck cholesterol We'll notify you of your urine results and make any changes if needed Use the Nystatin powder 2-3x day- particularly in the creases When home and able to air out the area, apply the ointment to the red, painful areas Call and schedule your mammogram Call with any questions or concerns Stay Safe!  Stay Healthy!!

## 2020-04-07 NOTE — Progress Notes (Signed)
   Subjective:    Patient ID: [name], female    DOB: [birth year], 79 y.o.   MRN: [id number]  HPI Vaginal itch- sxs started ~4 days ago.  Using OTC powder.  Will wear a pad when golfing or walking.  No new sexual partners.  'it's red and it itches'- includes vagina, groin creases.  DM- chronic problem, following w/ Dr Posey Pronto (endo).  Last A1C 6.5.  UTD on foot exam.  Will get microalbumin  Due for mammogram- pt to call.  Eye exam 2 weeks ago.  Due for microalbumin.   Review of Systems For ROS see HPI   This visit occurred during the SARS-CoV-2 public health emergency.  Safety protocols were in place, including screening questions prior to the visit, additional usage of staff PPE, and extensive cleaning of exam room while observing appropriate contact time as indicated for disinfecting solutions.       Objective:   Physical Exam Vitals reviewed.  Constitutional:      General: She is not in acute distress.    Appearance: Normal appearance. She is not ill-appearing.  HENT:     Head: Normocephalic and atraumatic.  Genitourinary:    Comments: Vulva red, mildly swollen Skin:    General: Skin is warm and dry.     Findings: Erythema (deep red areas in the groin creases bilaterally, L>R) present.  Neurological:     General: No focal deficit present.     Mental Status: She is alert and oriented to person, place, and time.  Psychiatric:        Mood and Affect: Mood normal.        Behavior: Behavior normal.        Thought Content: Thought content normal.           Assessment & Plan:  Vaginal itching- appears consistent w/ yeast dermatitis.  Start Nystatin powder for when out and about and ointment when home and able to air out.  Pt expressed understanding and is in agreement w/ plan.   DM- UTD on A1C, eye exam, foot exam.  Will do Microalbumin today.

## 2020-04-22 ENCOUNTER — Other Ambulatory Visit: Payer: Self-pay

## 2020-04-22 MED ORDER — ATORVASTATIN CALCIUM 20 MG PO TABS: ORAL_TABLET | ORAL | 1 refills | Status: DC

## 2020-04-22 MED ORDER — FENOFIBRATE 145 MG PO TABS: ORAL_TABLET | ORAL | 6 refills | Status: DC

## 2020-04-29 ENCOUNTER — Ambulatory Visit: Payer: Medicare Other

## 2020-04-29 ENCOUNTER — Telehealth: Payer: Self-pay

## 2020-04-29 NOTE — Chronic Care Management (AMB) (Signed)
  Chronic Care Management   Outreach Note   Name: [name] MRN: [id number] DOB: Jan 22, 1941  Referred by: Midge Minium, MD Reason for referral: Telephone Appointment with [facility] Pharmacist, Madelin Rear.   An unsuccessful telephone outreach was attempted today. The patient was referred to the pharmacist for assistance with care management and care coordination.   Telephone appointment with clinical pharmacist today (04/29/2020) at 830am. If patient immediately returns call, transfer to [phone]. Otherwise, please provide this number so patient can reschedule visit.   Madelin Rear, Pharm.D., BCGP Clinical Pharmacist [facility] [phone]

## 2020-04-29 NOTE — Progress Notes (Addendum)
Patient returned call stating she missed call from her CPP .  Informed patient it was regarding her follow up phone visit at 8:30am. Informed patient we would have to reschedule .  Rescheduled patients appointment for tomorrow 04-30-20 at 1:30pm .   Georgiana Shore ,[place] Pharmacist Assistant [phone]  Spoke w/ pt would like a call back anytime week of 11/15

## 2020-05-06 ENCOUNTER — Other Ambulatory Visit: Payer: Self-pay

## 2020-05-06 ENCOUNTER — Ambulatory Visit: Payer: Medicare Other

## 2020-05-06 ENCOUNTER — Telehealth: Payer: Self-pay

## 2020-05-06 DIAGNOSIS — E782 Mixed hyperlipidemia: Secondary | ICD-10-CM

## 2020-05-06 DIAGNOSIS — E119 Type 2 diabetes mellitus without complications: Secondary | ICD-10-CM

## 2020-05-06 NOTE — Patient Instructions (Signed)
[name],  Thank you for taking to time to talk with me today! I have included the handout with information on Medicare plan selection support through the state's [facility] program as discussed.  Please review care plan below and call me at [phone] with any questions!  Thanks! Edison Nasuti  Goals Addressed            This Visit's Progress   . A1c <7%   On track    [place] (see longitudinal plan of care for additional care plan information)  Current Barriers:  . Diabetes: type 2 Lab Results  Component Value Date   HGBA1C 6.8 (H) 11/21/2018 .   Lab Results  Component Value Date   CREATININE 0.79 05/20/2019   CREATININE 0.82 02/03/2019   CREATININE 0.92 11/21/2018 .  Current antihyperglycemic regimen:  o Sitagliptan 100 mg daily . Denies hypoglycemic symptoms, including dizziness, lightheadedness, shaking, sweating . Cardiovascular risk reduction: o Current hyperlipidemia regimen: atorvastatin 20 mg daily, fenofibrate 145 mg daily.  o Reduce NSAID use Pharmacist Clinical Goal(s):  Marland Kitchen Over the next 180 days, patient will work with PharmD and primary care provider to address type 2 diabetes. o Maintain A1c <7% Interventions: . Comprehensive medication review performed, medication list updated in electronic medical record . Inter-disciplinary care team collaboration  . Reduce use of ibuprofen to 200 mg twice daily, adding on voltaren gel if needed Patient Self Care Activities:  . Patient will focus on medication adherence by continuing current medication management. . Patient will report any questions or concerns to provider  Initial goal documentation.     . LDL Cholesterol <100   On track    CARE PLAN ENTRY Current Barriers:  . Current antihyperlipidemic regimen: o Atorvastatin 20 mg daily o Fenofibrate 145 mg daily . Most recent lipid panel:     Component Value Date/Time   CHOL 121 05/20/2019 1125   TRIG 144.0 05/20/2019 1125   HDL 35.40 (L) 05/20/2019 1125    CHOLHDL 3 05/20/2019 1125   VLDL 28.8 05/20/2019 1125   LDLCALC 57 05/20/2019 1125   LDLCALC 70 08/15/2017 1533   LDLDIRECT 131.0 02/29/2016 1055  Pharmacist Clinical Goal(s):  Marland Kitchen Over the next 180 days, patient will work with PharmD and providers towards optimized antihyperlipidemic therapy o Maintain LDL cholesterol < 100 Interventions: . Comprehensive medication review performed; medication list updated in electronic medical record.  [name] care team collaboration (see longitudinal plan of care) Patient Self Care Activities:  . Patient will focus on medication adherence by continuing current medication management.  Initial goal documentation.       The patient verbalized understanding of instructions provided today and agreed to receive a mailed copy of patient instruction and/or educational materials. Telephone follow up appointment with pharmacy team member scheduled for: See next appointment with "Care Management Staff" under "What's Next" below.   Madelin Rear, Pharm.D., BCGP Clinical Pharmacist [facility] [phone]

## 2020-05-06 NOTE — Progress Notes (Signed)
01/05/20   [facility]  Name: [name]  MRN: [id number] DOB: Nov 07, 1940  Chief Complaint/ HPI  [name],  79 y.o. , female presents for their Follow-Up CCM visit with the clinical pharmacist via telephone due to COVID-19 Pandemic.  PCP : Midge Minium, MD  Their chronic conditions include: HLD, DM, GERD, depression/anxiety.  Patient Active Problem List   Diagnosis Date Noted  . Chronic diarrhea 12/26/2017  . S/P shoulder replacement, right 09/15/2016  . Benign paroxysmal positional vertigo 10/14/2014  . Back pain 08/27/2014  . Physical exam 08/27/2014  . Decreased hearing of both ears 05/19/2014  . S/P cholecystectomy 10/02/2013  . Asthma, moderate persistent 05/16/2013  . H/O recurrent pneumonia 05/16/2013  . Insomnia 05/05/2013  . Gluten intolerance 05/06/2012  . Renal calculus, left 01/29/2012  . Painful bladder spasm 12/14/2011  . Lactose intolerance 12/14/2011  . Post-menopausal 12/14/2011  . Microscopic colitis 11/08/2011  . Hepatic steatosis 08/25/2011  . Pulmonary nodule, right 08/25/2011  . Hematuria 08/23/2011  . Memory loss 12/12/2010  . Diabetes mellitus type II, controlled, with no complications ([place]) 56/25/6389  . Hyperlipidemia 07/14/2010  . Depression with anxiety 07/14/2010  . GLAUCOMA 07/14/2010  . ALLERGIC RHINITIS 07/14/2010   Past Surgical History:  Procedure Laterality Date  . ABDOMINAL HYSTERECTOMY     Has ovaries  . BACK SURGERY  1987   lumb  . BREAST SURGERY Left 07/2016   bx- needle in breast  . CHOLECYSTECTOMY  09/26/13  . CLOSED REDUCTION NASAL FRACTURE  04/29/2012   Procedure: CLOSED REDUCTION NASAL FRACTURE;  Surgeon: Jodi Marble, MD;  Location: [facility];  Service: ENT;  Laterality: N/A;  WITH STABILIZATION  . COLONOSCOPY    . Deviated septum repair    . EYE SURGERY Bilateral    cataracts  . LITHOTRIPSY    . REVERSE SHOULDER ARTHROPLASTY Right 09/15/2016   Procedure: RIGHT  REVERSE SHOULDER ARTHROPLASTY;  Surgeon: Netta Cedars, MD;  Location: [facility];  Service: Orthopedics;  Laterality: Right;  . ROTATOR CUFF REPAIR     right  . TONSILLECTOMY     Social History   Socioeconomic History  . Marital status: Widowed    Spouse name: Not on file  . Number of children: 2  . Years of education: Not on file  . Highest education level: Not on file  Occupational History  . Occupation: Retired -Sports coach: RETIRED  Tobacco Use  . Smoking status: Former Smoker    Packs/day: 0.10    Years: 4.00    Pack years: 0.40    Types: Cigarettes    Quit date: 06/19/1986    Years since quitting: 33.9  . Smokeless tobacco: Never Used  . Tobacco comment: smoked in college years ago  Vaping Use  . Vaping Use: Never used  Substance and Sexual Activity  . Alcohol use: Yes    Comment: 1-2 glasses week variety beer occ scotch  . Drug use: No  . Sexual activity: Not Currently  Other Topics Concern  . Not on file  Social History Narrative   Daily caffeine    Social Determinants of Health   Financial Resource Strain: Low Risk   . Difficulty of Paying Living Expenses: Not hard at all  Food Insecurity: No Food Insecurity  . Worried About Charity fundraiser in the Last Year: Never true  . Ran Out of Food in the Last Year: Never true  Transportation Needs: No Transportation Needs  .  Lack of Transportation (Medical): No  . Lack of Transportation (Non-Medical): No  Physical Activity:   . Days of Exercise per Week: Not on file  . Minutes of Exercise per Session: Not on file  Stress:   . Feeling of Stress : Not on file  Social Connections:   . Frequency of Communication with Friends and Family: Not on file  . Frequency of Social Gatherings with Friends and Family: Not on file  . Attends Religious Services: Not on file  . Active Member of Clubs or Organizations: Not on file  . Attends Archivist Meetings: Not on file  . Marital Status: Not on file    Family History  Problem Relation Age of Onset  . Diabetes Father   . Colon cancer Neg Hx   . Esophageal cancer Neg Hx   . Rectal cancer Neg Hx   . Stomach cancer Neg Hx   . Breast cancer Neg Hx    Allergies  Allergen Reactions  . Papain Anaphylaxis    Takes an extreme concentration  . Papaya Derivatives Anaphylaxis  . Mushroom Ext Cmplx(Shiitake-Reishi-Mait) Other (See Comments)    Head congestion and sore throat  . Mushroom Extract Complex Other (See Comments)    Head congestion and sore throat  . Venlafaxine Other (See Comments)    UNSPECIFIED REACTION  Pt states she can take Name brand. Lethargy   Outpatient Encounter Medications as of 05/06/2020  Medication Sig Note  . atorvastatin (LIPITOR) 20 MG tablet Take 1 tab daily by mouth   . Biotin 5000 MCG CAPS Take 1 capsule by mouth daily.   . cetirizine (ZYRTEC) 10 MG tablet Take by mouth.   . citalopram (CELEXA) 40 MG tablet TAKE ONE (1) TABLET BY MOUTH EVERY DAY   . famotidine (PEPCID) 40 MG tablet TAKE TWO TABLETS BY MOUTH EVERY NIGHT AT BEDTIME   . fenofibrate (TRICOR) 145 MG tablet TAKE ONE TABLET BY MOUTH EVERY MORNING AFTER BREAKFAST   . ibuprofen (ADVIL) 200 MG tablet Take 200 mg by mouth 2 (two) times daily.    Marland Kitchen LUMIGAN 0.01 % SOLN Place 1 drop into both eyes at bedtime.  10/14/2014: Received from: External Pharmacy  . Melatonin 10 MG CAPS Take by mouth.   . meloxicam (MOBIC) 7.5 MG tablet Take 1 tablet (7.5 mg total) by mouth daily.   . montelukast (SINGULAIR) 10 MG tablet Take 1 tablet (10 mg total) by mouth at bedtime.   . Multiple Vitamins-Minerals (CENTRUM SILVER PO) Take 1 tablet by mouth daily.   Marland Kitchen nystatin (MYCOSTATIN/NYSTOP) powder Apply 1 application topically 3 (three) times daily.   Marland Kitchen nystatin ointment (MYCOSTATIN) Apply 1 application topically 2 (two) times daily.   Marland Kitchen saccharomyces boulardii (FLORASTOR) 250 MG capsule Take 1 capsule (250 mg total) by mouth 2 (two) times daily.   . sitaGLIPtin (JANUVIA)  100 MG tablet Take 1 tablet (100 mg total) by mouth daily.    No facility-administered encounter medications on file as of 05/06/2020.   Patient Care Team    Relationship Specialty Notifications Start End  Midge Minium, MD PCP - General Family Medicine  12/08/10    Comment: Alice Rieger, AUD  Audiology  08/25/14   Netta Cedars, MD Consulting Physician Orthopedic Surgery  08/15/17   Hollar, Katharine Look, MD Referring Physician Dermatology  08/15/17   Milus Banister, MD Attending Physician Gastroenterology  11/23/17   Charlaine Dalton, MD Referring Physician Pulmonary Disease  10/22/18   Calvert Cantor,  MD Consulting Physician Ophthalmology  10/22/18   Joycie Peek, MD  General Practice  10/22/18    Comment: Dentist  Amalia Greenhouse, MD Referring Physician Endocrinology  11/22/18   Kathie Rhodes, MD (Inactive) Consulting Physician Urology  02/18/19   Madelin Rear, [facility] Pharmacist Pharmacist Admissions 09/26/19    Comment: phone number [phone]    Current Diagnosis/Assessment: Goals Addressed            This Visit's Progress   . A1c <7%   On track    [place] (see longitudinal plan of care for additional care plan information)  Current Barriers:  . Diabetes: type 2 Lab Results  Component Value Date   HGBA1C 6.8 (H) 11/21/2018 .   Lab Results  Component Value Date   CREATININE 0.79 05/20/2019   CREATININE 0.82 02/03/2019   CREATININE 0.92 11/21/2018 .  Current antihyperglycemic regimen:  o Sitagliptan 100 mg daily . Denies hypoglycemic symptoms, including dizziness, lightheadedness, shaking, sweating . Cardiovascular risk reduction: o Current hyperlipidemia regimen: atorvastatin 20 mg daily, fenofibrate 145 mg daily.  o Reduce NSAID use Pharmacist Clinical Goal(s):  Marland Kitchen Over the next 180 days, patient will work with PharmD and primary care provider to address type 2 diabetes. o Maintain A1c <7% Interventions: . Comprehensive medication review  performed, medication list updated in electronic medical record . Inter-disciplinary care team collaboration  . Reduce use of ibuprofen to 200 mg twice daily, adding on voltaren gel if needed Patient Self Care Activities:  . Patient will focus on medication adherence by continuing current medication management. . Patient will report any questions or concerns to provider  Initial goal documentation.     . LDL Cholesterol <100   On track    CARE PLAN ENTRY Current Barriers:  . Current antihyperlipidemic regimen: o Atorvastatin 20 mg daily o Fenofibrate 145 mg daily . Most recent lipid panel:     Component Value Date/Time   CHOL 121 05/20/2019 1125   TRIG 144.0 05/20/2019 1125   HDL 35.40 (L) 05/20/2019 1125   CHOLHDL 3 05/20/2019 1125   VLDL 28.8 05/20/2019 1125   LDLCALC 57 05/20/2019 1125   LDLCALC 70 08/15/2017 1533   LDLDIRECT 131.0 02/29/2016 1055  Pharmacist Clinical Goal(s):  Marland Kitchen Over the next 180 days, patient will work with PharmD and providers towards optimized antihyperlipidemic therapy o Maintain LDL cholesterol < 100 Interventions: . Comprehensive medication review performed; medication list updated in electronic medical record.  [name] care team collaboration (see longitudinal plan of care) Patient Self Care Activities:  . Patient will focus on medication adherence by continuing current medication management.  Initial goal documentation.       Diabetes   a1c < 7%  Lab Results  Component Value Date/Time   HGBA1C 6.5 03/09/2020 12:00 AM   HGBA1C 6.8 (H) 11/21/2018 01:21 PM   HGBA1C 6.1 (A) 12/13/2017 01:30 PM   HGBA1C 7.1 06/22/2017 01:29 PM   HGBA1C 7.3 (H) 09/08/2016 10:39 AM   MICROALBUR <0.7 04/07/2020 10:07 AM   MICROALBUR <0.7 11/21/2018 01:21 PM   FBGs 100-130. Eating chicken and fish much more than fish, good intake of vegetables, has started to eat apples more routinely.  Has been walking at local gym when not able to walk [place]. Patient is currently controlled on the following medications:  . Januvia 100 mg once daily   We discussed: diet and exercise extensively. Reviewed patient assistance - would not meet criteria for Januvia.   Plan Continue current  medications.   Hyperlipidemia   LDL goal < 100     Component Value Date/Time   CHOL 121 05/20/2019 1125   TRIG 144.0 05/20/2019 1125   HDL 35.40 (L) 05/20/2019 1125   CHOLHDL 3 05/20/2019 1125   VLDL 28.8 05/20/2019 1125   LDLCALC 57 05/20/2019 1125   LDLCALC 70 08/15/2017 1533   LDLDIRECT 131.0 02/29/2016 1055    Denies any side effects, last LDL 05/2019 at goal. Upcoming LDL recheck.  Patient is currently controlled on the following medications:   Fenofibrate 145 mg daily  Atorvastatin 20 mg daily  Plan  Continue current medications and control with diet and exercise. Upcoming F/U with PCP 05/2020  Medication Management / Care Coordination   Denies any issues with current medication management.  Pt uses [facility] for all medications. Uses pill box? Yes.  Discussed open enrollment/Medicare plan selection.  Provided number information for [place] [employer] SHIIP program if pt wanting to talk with Medicare counselor.  Plan  Continue current medication management strategy. _____________ CCM Follow-Up CPA: 7 month DM, schedule RPH telephone visit for following month.  RPH: 8 month f/u visit - DM  Future Appointments  Date Time Provider Echo  06/01/2020 10:00 AM Midge Minium, MD [facility], Pharm.D. Clinical Pharmacist [facility] [phone]

## 2020-05-06 NOTE — Progress Notes (Signed)
Patient called stating she would like to reschedule her follow up appointment with Edison Nasuti. Patient wanted to know if there were any available appointments today.  Informed patient I can schedule her today 05-06-20 at 3:30pm. Patient confirmed that this would be a great time.  Georgiana Shore ,[place] Pharmacist Assistant [phone]

## 2020-05-18 DIAGNOSIS — M25512 Pain in left shoulder: Secondary | ICD-10-CM | POA: Diagnosis not present

## 2020-05-31 ENCOUNTER — Other Ambulatory Visit: Payer: Self-pay

## 2020-06-01 ENCOUNTER — Encounter: Payer: Self-pay | Admitting: Family Medicine

## 2020-06-01 ENCOUNTER — Ambulatory Visit (INDEPENDENT_AMBULATORY_CARE_PROVIDER_SITE_OTHER): Payer: Medicare Other | Admitting: Family Medicine

## 2020-06-01 VITALS — BP 130/80 | HR 72 | Temp 97.7°F | Resp 16 | Ht 62.0 in | Wt 158.8 lb

## 2020-06-01 DIAGNOSIS — E663 Overweight: Secondary | ICD-10-CM | POA: Diagnosis not present

## 2020-06-01 DIAGNOSIS — E119 Type 2 diabetes mellitus without complications: Secondary | ICD-10-CM

## 2020-06-01 DIAGNOSIS — F418 Other specified anxiety disorders: Secondary | ICD-10-CM

## 2020-06-01 DIAGNOSIS — E782 Mixed hyperlipidemia: Secondary | ICD-10-CM

## 2020-06-01 LAB — BASIC METABOLIC PANEL
BUN: 21 mg/dL (ref 6–23)
CO2: 25 mEq/L (ref 19–32)
Calcium: 10.2 mg/dL (ref 8.4–10.5)
Chloride: 104 mEq/L (ref 96–112)
Creatinine, Ser: 0.89 mg/dL (ref 0.40–1.20)
GFR: 61.15 mL/min (ref >60.00)
Glucose, Bld: 104 mg/dL — ABNORMAL HIGH (ref 70–99)
Potassium: 4.3 mEq/L (ref 3.5–5.1)
Sodium: 138 mEq/L (ref 135–145)

## 2020-06-01 LAB — CBC WITH DIFFERENTIAL/PLATELET
Basophils Absolute: 0.1 10*3/uL (ref 0.0–0.1)
Basophils Relative: 0.9 % (ref 0.0–3.0)
Eosinophils Absolute: 0.3 10*3/uL (ref 0.0–0.7)
Eosinophils Relative: 3.6 % (ref 0.0–5.0)
HCT: 39.4 % (ref 36.0–46.0)
Hemoglobin: 13 g/dL (ref 12.0–15.0)
Lymphocytes Relative: 21.7 % (ref 12.0–46.0)
Lymphs Abs: 1.6 10*3/uL (ref 0.7–4.0)
MCHC: 33 g/dL (ref 30.0–36.0)
MCV: 87.9 fl (ref 78.0–100.0)
Monocytes Absolute: 0.5 10*3/uL (ref 0.1–1.0)
Monocytes Relative: 6.9 % (ref 3.0–12.0)
Neutro Abs: 5.1 10*3/uL (ref 1.4–7.7)
Neutrophils Relative %: 66.9 % (ref 43.0–77.0)
Platelets: 271 10*3/uL (ref 150.0–400.0)
RBC: 4.49 Mil/uL (ref 3.87–5.11)
RDW: 13.4 % (ref 11.5–15.5)
WBC: 7.6 10*3/uL (ref 4.0–10.5)

## 2020-06-01 LAB — HEPATIC FUNCTION PANEL
ALT: 14 U/L (ref 0–35)
AST: 17 U/L (ref 0–37)
Albumin: 4.5 g/dL (ref 3.5–5.2)
Alkaline Phosphatase: 41 U/L (ref 39–117)
Bilirubin, Direct: 0.1 mg/dL (ref 0.0–0.3)
Total Bilirubin: 0.4 mg/dL (ref 0.2–1.2)
Total Protein: 7 g/dL (ref 6.0–8.3)

## 2020-06-01 LAB — LIPID PANEL
Cholesterol: 117 mg/dL (ref 0–200)
HDL: 39.2 mg/dL (ref >39.00)
LDL Cholesterol: 52 mg/dL (ref 0–99)
NonHDL: 77.71
Total CHOL/HDL Ratio: 3
Triglycerides: 130 mg/dL (ref 0.0–149.0)
VLDL: 26 mg/dL (ref 0.0–40.0)

## 2020-06-01 LAB — TSH: TSH: 2.25 u[IU]/mL (ref 0.35–4.50)

## 2020-06-01 NOTE — Assessment & Plan Note (Signed)
Foot exam done today.  UTD on eye exam, microalbumin

## 2020-06-01 NOTE — Patient Instructions (Signed)
Follow up in 6 months to recheck cholesterol We'll notify you of your lab results and make any changes if needed Keep up the good work on healthy diet and regular exercise- you look great! Call with any questions or concerns Stay Safe!  Stay Healthy! Happy Holidays! 

## 2020-06-01 NOTE — Progress Notes (Signed)
   Subjective:    Patient ID: [name], female    DOB: Jun 04, 1941, 79 y.o.   MRN: [id number]  HPI Hyperlipidemia- chronic problem, on Lipitor 20mg  daily.  Denies CP, SOB, abd pain, N/V  Overweight- pt is down 6 lbs since last visit.  Pt is cutting down on carbs, walking 6-7 miles/week  Depression/anxiety- ongoing issue.  On Celexa 40mg  daily.  Mood is well controlled.  Pt is now spending time w/ a gentleman (Art) and is no longer feeling lonely.  DM- due for foot exam.  UTD on eye exam, microalbumin.  Health maintenance- UTD on flu, COVID (including booster) no longer having colonoscopies, due for mammo next year.   Review of Systems For ROS see HPI   This visit occurred during the SARS-CoV-2 public health emergency.  Safety protocols were in place, including screening questions prior to the visit, additional usage of staff PPE, and extensive cleaning of exam room while observing appropriate contact time as indicated for disinfecting solutions.       Objective:   Physical Exam        Assessment & Plan:

## 2020-06-01 NOTE — Assessment & Plan Note (Signed)
Pt is down 6 lbs since last visit.  Applauded her efforts at healthy diet and regular exercise.  Will continue to follow.

## 2020-06-01 NOTE — Assessment & Plan Note (Signed)
Well controlled today on Citalopram 40mg  daily.  No med changes at this time

## 2020-06-15 DIAGNOSIS — J320 Chronic maxillary sinusitis: Secondary | ICD-10-CM | POA: Diagnosis not present

## 2020-06-25 NOTE — Progress Notes (Signed)
Subjective:   [name] is a 80 y.o. female who presents for Medicare Annual (Subsequent) preventive examination.  I connected with [name] today by telephone and verified that I am speaking with the correct person using two identifiers. Location patient: home Location provider: work Persons participating in the virtual visit: patient, Marine scientist.    I discussed the limitations, risks, security and privacy concerns of performing an evaluation and management service by telephone and the availability of in person appointments. I also discussed with the patient that there may be a patient responsible charge related to this service. The patient expressed understanding and verbally consented to this telephonic visit.    Interactive audio and video telecommunications were attempted between this provider and patient, however failed, due to patient having technical difficulties OR patient did not have access to video capability.  We continued and completed visit with audio only.  Some vital signs may be absent or patient reported.   Time Spent with patient on telephone encounter: 30 minutes   Review of Systems     Cardiac Risk Factors include: advanced age (>12men, >36 women);diabetes mellitus;dyslipidemia     Objective:    Today's Vitals   06/28/20 1546  Weight: 158 lb (71.7 kg)  Height: 5\' 2"  (1.575 m)   Body mass index is 28.9 kg/m.  Advanced Directives 06/28/2020 02/27/2018 08/15/2017 09/08/2016 07/08/2014 05/16/2013 04/25/2012  Does Patient Have a Medical Advance Directive? Yes Yes Yes Yes Yes Patient would not like information Patient has advance directive, copy not in chart  Type of Advance Directive [place];Living will [place];Living will [place];Living will [place];Living will Living will;Healthcare Power of Attorney - -  Does patient want to make changes to medical advance directive? - - - No -  Patient declined - - -  Copy of [place] in Chart? Yes - validated most recent copy scanned in chart (See row information) - Yes Yes - - -  Pre-existing out of facility DNR order (yellow form or pink MOST form) - - - - - No -    Current Medications (verified) Outpatient Encounter Medications as of 06/28/2020  Medication Sig  . atorvastatin (LIPITOR) 20 MG tablet Take 1 tab daily by mouth  . Biotin 5000 MCG CAPS Take 1 capsule by mouth daily.  . cetirizine (ZYRTEC) 10 MG tablet Take by mouth.  . citalopram (CELEXA) 40 MG tablet TAKE ONE (1) TABLET BY MOUTH EVERY DAY  . famotidine (PEPCID) 40 MG tablet TAKE TWO TABLETS BY MOUTH EVERY NIGHT AT BEDTIME  . fenofibrate (TRICOR) 145 MG tablet TAKE ONE TABLET BY MOUTH EVERY MORNING AFTER BREAKFAST  . ibuprofen (ADVIL) 200 MG tablet Take 200 mg by mouth 2 (two) times daily.   Marland Kitchen LUMIGAN 0.01 % SOLN Place 1 drop into both eyes at bedtime.   . Melatonin 10 MG CAPS Take by mouth.  . montelukast (SINGULAIR) 10 MG tablet Take 1 tablet (10 mg total) by mouth at bedtime.  Marland Kitchen nystatin (MYCOSTATIN/NYSTOP) powder Apply 1 application topically 3 (three) times daily.  Marland Kitchen nystatin ointment (MYCOSTATIN) Apply 1 application topically 2 (two) times daily.  Marland Kitchen saccharomyces boulardii (FLORASTOR) 250 MG capsule Take 1 capsule (250 mg total) by mouth 2 (two) times daily.  . sitaGLIPtin (JANUVIA) 100 MG tablet Take 1 tablet (100 mg total) by mouth daily.  . meloxicam (MOBIC) 7.5 MG tablet Take 1 tablet (7.5 mg total) by mouth daily. (Patient not taking:  No sig reported)  . Multiple Vitamins-Minerals (CENTRUM SILVER PO) Take 1 tablet by mouth daily. (Patient not taking: No sig reported)   No facility-administered encounter medications on file as of 06/28/2020.    Allergies (verified) Papain, Papaya derivatives, Mushroom ext cmplx(shiitake-reishi-mait), Mushroom extract complex, and Venlafaxine   History: Past Medical History:  Diagnosis Date  .  Allergic rhinitis   . Asthma   . Diabetes mellitus    just changed from injection to metformin ? month  . Dysrhythmia    palpitations  . Glaucoma   . History of kidney stones   . Hyperlipemia   . Migraines   . Palpitations   . Pneumonia    hx  . Urinary tract infection    Past Surgical History:  Procedure Laterality Date  . ABDOMINAL HYSTERECTOMY     Has ovaries  . BACK SURGERY  1987   lumb  . BREAST SURGERY Left 07/2016   bx- needle in breast  . CHOLECYSTECTOMY  09/26/13  . CLOSED REDUCTION NASAL FRACTURE  04/29/2012   Procedure: CLOSED REDUCTION NASAL FRACTURE;  Surgeon: Jodi Marble, MD;  Location: [facility];  Service: ENT;  Laterality: N/A;  WITH STABILIZATION  . COLONOSCOPY    . Deviated septum repair    . EYE SURGERY Bilateral    cataracts  . LITHOTRIPSY    . REVERSE SHOULDER ARTHROPLASTY Right 09/15/2016   Procedure: RIGHT REVERSE SHOULDER ARTHROPLASTY;  Surgeon: Netta Cedars, MD;  Location: [facility];  Service: Orthopedics;  Laterality: Right;  . ROTATOR CUFF REPAIR     right  . TONSILLECTOMY     Family History  Problem Relation Age of Onset  . Diabetes Father   . Colon cancer Neg Hx   . Esophageal cancer Neg Hx   . Rectal cancer Neg Hx   . Stomach cancer Neg Hx   . Breast cancer Neg Hx    Social History   Socioeconomic History  . Marital status: Widowed    Spouse name: Not on file  . Number of children: 2  . Years of education: Not on file  . Highest education level: Not on file  Occupational History  . Occupation: Retired -Sports coach: RETIRED  Tobacco Use  . Smoking status: Former Smoker    Packs/day: 0.10    Years: 4.00    Pack years: 0.40    Types: Cigarettes    Quit date: 06/19/1986    Years since quitting: 34.0  . Smokeless tobacco: Never Used  . Tobacco comment: smoked in college years ago  Vaping Use  . Vaping Use: Never used  Substance and Sexual Activity  . Alcohol use: Yes    Comment: 1-2 glasses week  variety beer occ scotch  . Drug use: No  . Sexual activity: Not Currently  Other Topics Concern  . Not on file  Social History Narrative   Daily caffeine    Social Determinants of Health   Financial Resource Strain: Low Risk   . Difficulty of Paying Living Expenses: Not hard at all  Food Insecurity: No Food Insecurity  . Worried About Charity fundraiser in the Last Year: Never true  . Ran Out of Food in the Last Year: Never true  Transportation Needs: No Transportation Needs  . Lack of Transportation (Medical): No  . Lack of Transportation (Non-Medical): No  Physical Activity: Sufficiently Active  . Days of Exercise per Week: 3 days  . Minutes of Exercise per Session: 50  min  Stress: No Stress Concern Present  . Feeling of Stress : Not at all  Social Connections: Moderately Integrated  . Frequency of Communication with Friends and Family: More than three times a week  . Frequency of Social Gatherings with Friends and Family: More than three times a week  . Attends Religious Services: More than 4 times per year  . Active Member of Clubs or Organizations: Yes  . Attends Archivist Meetings: 1 to 4 times per year  . Marital Status: Widowed    Tobacco Counseling Counseling given: Not Answered Comment: smoked in college years ago   Clinical Intake:  Pre-visit preparation completed: Yes  Pain : No/denies pain     Nutritional Status: BMI 25 -29 Overweight Nutritional Risks: None Diabetes: Yes CBG done?: No Did pt. bring in CBG monitor from home?: No  How often do you need to have someone help you when you read instructions, pamphlets, or other written materials from your doctor or pharmacy?: 1 - Never  Diabetes:  Is the patient diabetic?  Yes  If diabetic, was a CBG obtained today?  No  Did the patient bring in their glucometer from home?  No phone visit How often do you monitor your CBG's? never.   Financial Strains and Diabetes Management:  Are you  having any financial strains with the device, your supplies or your medication? No .  Does the patient want to be seen by Chronic Care Management for management of their diabetes?  No  Would the patient like to be referred to a Nutritionist or for Diabetic Management?  No   Diabetic Exams:  Diabetic Eye Exam: Completed 03/02/2020.   Diabetic Foot Exam: Completed 06/01/2020.  Interpreter Needed?: No  Information entered by :: Caroleen Hamman LPN   Activities of Daily Living In your present state of health, do you have any difficulty performing the following activities: 06/28/2020 06/01/2020  Hearing? Y N  Comment hearing aids -  Vision? N N  Difficulty concentrating or making decisions? Y N  Comment occasionally forgets names -  Walking or climbing stairs? N N  Dressing or bathing? N N  Doing errands, shopping? N N  Preparing Food and eating ? N -  Using the Toilet? N -  In the past six months, have you accidently leaked urine? N -  Do you have problems with loss of bowel control? N -  Managing your Medications? N -  Managing your Finances? N -  Housekeeping or managing your Housekeeping? N -  Some recent data might be hidden    Patient Care Team: Midge Minium, MD as PCP - General (Family Medicine) Lonzo Candy, AUD (Audiology) Netta Cedars, MD as Consulting Physician (Orthopedic Surgery) Hollar, Katharine Look, MD as Referring Physician (Dermatology) Milus Banister, MD as Attending Physician (Gastroenterology) Charlaine Dalton, MD as Referring Physician (Pulmonary Disease) Calvert Cantor, MD as Consulting Physician (Ophthalmology) Joycie Peek, MD (General Practice) Amalia Greenhouse, MD as Referring Physician (Endocrinology) Kathie Rhodes, MD (Inactive) as Consulting Physician (Urology) Madelin Rear, [facility] as Pharmacist (Pharmacist)  Indicate any recent Medical Services you may have received from other than [facility] providers in the past year (date may be  approximate).     Assessment:   This is a routine wellness examination for [place].  Hearing/Vision screen  Hearing Screening   125Hz  250Hz  500Hz  1000Hz  2000Hz  3000Hz  4000Hz  6000Hz  8000Hz   Right ear:           Left ear:  Comments: Bilateral hearing aids  Vision Screening Comments: Last eye exam 02/2020 Dr. Bing Plume  Dietary issues and exercise activities discussed: Current Exercise Habits: Home exercise routine, Type of exercise: walking, Time (Minutes): 45, Frequency (Times/Week): 3, Weekly Exercise (Minutes/Week): 135, Intensity: Mild, Exercise limited by: None identified  Goals    . A1c <7%     CARE PLAN ENTRY (see longitudinal plan of care for additional care plan information)  Current Barriers:  . Diabetes: type 2 Lab Results  Component Value Date   HGBA1C 6.8 (H) 11/21/2018 .   Lab Results  Component Value Date   CREATININE 0.79 05/20/2019   CREATININE 0.82 02/03/2019   CREATININE 0.92 11/21/2018 .  Current antihyperglycemic regimen:  o Sitagliptan 100 mg daily . Denies hypoglycemic symptoms, including dizziness, lightheadedness, shaking, sweating . Cardiovascular risk reduction: o Current hyperlipidemia regimen: atorvastatin 20 mg daily, fenofibrate 145 mg daily.  o Reduce NSAID use Pharmacist Clinical Goal(s):  Marland Kitchen Over the next 180 days, patient will work with PharmD and primary care provider to address type 2 diabetes. o Maintain A1c <7% Interventions: . Comprehensive medication review performed, medication list updated in electronic medical record . Inter-disciplinary care team collaboration  . Reduce use of ibuprofen to 200 mg twice daily, adding on voltaren gel if needed Patient Self Care Activities:  . Patient will focus on medication adherence by continuing current medication management. . Patient will report any questions or concerns to provider  Initial goal documentation.     Marland Kitchen Anxiety/Depression: Minimize recurring symptoms     CARE PLAN  ENTRY Current Barriers:  . Chronic Disease Management support, education, and care coordination needs related to anxiety/depression . Current regimen o Citalopram 40 mg once daily Pharmacist Clinical Goal(s):  Marland Kitchen Over next 180 days: Minimize recurring symptoms of depression.  Interventions: . Comprehensive medication review performed. Patient Self Care Activities:  . Patient verbalizes understanding of plan to continue medication as prescribed over next 180 days. Please call with any questions! Initial goal documentation.    . LDL Cholesterol <100     CARE PLAN ENTRY Current Barriers:  . Current antihyperlipidemic regimen: o Atorvastatin 20 mg daily o Fenofibrate 145 mg daily . Most recent lipid panel:     Component Value Date/Time   CHOL 121 05/20/2019 1125   TRIG 144.0 05/20/2019 1125   HDL 35.40 (L) 05/20/2019 1125   CHOLHDL 3 05/20/2019 1125   VLDL 28.8 05/20/2019 1125   LDLCALC 57 05/20/2019 1125   LDLCALC 70 08/15/2017 1533   LDLDIRECT 131.0 02/29/2016 1055  Pharmacist Clinical Goal(s):  Marland Kitchen Over the next 180 days, patient will work with PharmD and providers towards optimized antihyperlipidemic therapy o Maintain LDL cholesterol < 100 Interventions: . Comprehensive medication review performed; medication list updated in electronic medical record.  [name] care team collaboration (see longitudinal plan of care) Patient Self Care Activities:  . Patient will focus on medication adherence by continuing current medication management.  Initial goal documentation.     Marland Kitchen weight     Lose weight (10 lbs) by controlling portion control.       Depression Screen PHQ 2/9 Scores 06/28/2020 06/01/2020 04/07/2020 01/23/2020 11/10/2019 11/10/2019 08/11/2019  PHQ - 2 Score 0 0 0 0 0 0 0  PHQ- 9 Score - 0 - - 0 - -    Fall Risk Fall Risk  06/28/2020 06/01/2020 04/07/2020 11/10/2019 08/11/2019  Falls in the past year? 1 0 1 1 0  Number falls in past yr: 0 0 0  0 0  Injury with  Fall? 0 0 1 0 0  Risk for fall due to : - No Fall Risks - - -  Follow up Falls prevention discussed - Falls evaluation completed Falls evaluation completed Falls evaluation completed    [place]:  Any stairs in or around the home? No  Home free of loose throw rugs in walkways, pet beds, electrical cords, etc? Yes  Adequate lighting in your home to reduce risk of falls? Yes   ASSISTIVE DEVICES UTILIZED TO PREVENT FALLS:  Life alert? No  Use of a cane, walker or w/c? No  Grab bars in the bathroom? Yes  Shower chair or bench in shower? No  Elevated toilet seat or a handicapped toilet? No   TIMED UP AND GO:  Was the test performed? No . Phone visit   Cognitive Function:Normal cognitive status assessed by  this Nurse Health Advisor. No abnormalities found.          Immunizations Immunization History  Administered Date(s) Administered  . Influenza Split 04/11/2011  . Influenza, High Dose Seasonal PF 02/21/2019, 03/19/2020  . Influenza,inj,Quad PF,6+ Mos 04/10/2013, 03/10/2014, 04/20/2015, 02/29/2016, 03/20/2017, 02/06/2018  . Moderna Sars-Covid-2 Vaccination 07/29/2019, 08/12/2019  . Pneumococcal Conjugate-13 08/27/2014  . Pneumococcal Polysaccharide-23 04/19/2010  . Tdap 04/20/2012  . Zoster Recombinat (Shingrix) 04/15/2018, 07/15/2018    TDAP status: Up to date  Flu Vaccine status: Up to date  Pneumococcal vaccine status: Up to date  Covid-19 vaccine status: Completed vaccines  Qualifies for Shingles Vaccine? No   Zostavax completed No   Shingrix Completed?: Yes  Screening Tests Health Maintenance  Topic Date Due  . COVID-19 Vaccine (2 - Moderna 3-dose booster series) 09/09/2019  . MAMMOGRAM  07/27/2020 (Originally 12/28/2019)  . HEMOGLOBIN A1C  09/06/2020  . OPHTHALMOLOGY EXAM  03/02/2021  . URINE MICROALBUMIN  04/07/2021  . FOOT EXAM  06/01/2021  . TETANUS/TDAP  04/20/2022  . INFLUENZA VACCINE  Completed  . DEXA SCAN   Completed  . PNA vac Low Risk Adult  Completed    Health Maintenance  Health Maintenance Due  Topic Date Due  . COVID-19 Vaccine (2 - Moderna 3-dose booster series) 09/09/2019    Colorectal cancer screening: No longer required.   Mammogram status: Due-Patient would like to call & scheudule herself.  Bone Density status:Due-Patient would like to call & scheudule herself.  Lung Cancer Screening: (Low Dose CT Chest recommended if Age 35-80 years, 30 pack-year currently smoking OR have quit w/in 15years.) does not qualify.    Additional Screening:  Hepatitis C Screening: does not qualify  Vision Screening: Recommended annual ophthalmology exams for early detection of glaucoma and other disorders of the eye. Is the patient up to date with their annual eye exam?  Yes  Who is the provider or what is the name of the office in which the patient attends annual eye exams? Dr. Bing Plume   Dental Screening: Recommended annual dental exams for proper oral hygiene  Community Resource Referral / Chronic Care Management: CRR required this visit?  No   CCM required this visit?  No      Plan:     I have personally reviewed and noted the following in the patient's chart:   . Medical and social history . Use of alcohol, tobacco or illicit drugs  . Current medications and supplements . Functional ability and status . Nutritional status . Physical activity . Advanced directives . List of other physicians . Hospitalizations,  surgeries, and ER visits in previous 12 months . Vitals . Screenings to include cognitive, depression, and falls . Referrals and appointments  In addition, I have reviewed and discussed with patient certain preventive protocols, quality metrics, and best practice recommendations. A written personalized care plan for preventive services as well as general preventive health recommendations were provided to patient.   Due to this being a telephonic visit, the after  visit summary with patients personalized plan was offered to patient via mail or my-chart. Patient would like to access on my-chart.  Marta Antu, LPN   [id number]  Nurse Health Advisor  Nurse Notes: None

## 2020-06-28 ENCOUNTER — Ambulatory Visit (INDEPENDENT_AMBULATORY_CARE_PROVIDER_SITE_OTHER): Payer: Medicare Other

## 2020-06-28 ENCOUNTER — Other Ambulatory Visit: Payer: Self-pay

## 2020-06-28 VITALS — Ht 62.0 in | Wt 158.0 lb

## 2020-06-28 DIAGNOSIS — Z0000 Encounter for general adult medical examination without abnormal findings: Secondary | ICD-10-CM | POA: Diagnosis not present

## 2020-06-28 NOTE — Patient Instructions (Signed)
[name] , Thank you for taking time to complete your Medicare Wellness Visit. I appreciate your ongoing commitment to your health goals. Please review the following plan we discussed and let me know if I can assist you in the future.   Screening recommendations/referrals: Colonoscopy: No longer required Mammogram: Due-Per our conversation, you will call to schedule. Bone Density: Due-Per our conversation, you will call to schedule. Recommended yearly ophthalmology/optometry visit for glaucoma screening and checkup Recommended yearly dental visit for hygiene and checkup  Vaccinations: Influenza vaccine: Up to date Pneumococcal vaccine: Completed vaccines Tdap vaccine: Up to date-Due-04/20/2022 Shingles vaccine: Completed vaccines  Covid-19: Completed vaccines  Advanced directives: Copy in chart  Conditions/risks identified: See problem list  Next appointment: Follow up in one year for your annual wellness visit    Preventive Care 81 Years and Older, Female Preventive care refers to lifestyle choices and visits with your health care provider that can promote health and wellness. What does preventive care include?  A yearly physical exam. This is also called an annual well check.  Dental exams once or twice a year.  Routine eye exams. Ask your health care provider how often you should have your eyes checked.  Personal lifestyle choices, including:  Daily care of your teeth and gums.  Regular physical activity.  Eating a healthy diet.  Avoiding tobacco and drug use.  Limiting alcohol use.  Practicing safe sex.  Taking low-dose aspirin every day.  Taking vitamin and mineral supplements as recommended by your health care provider. What happens during an annual well check? The services and screenings done by your health care provider during your annual well check will depend on your age, overall health, lifestyle risk factors, and family history of disease. Counseling   Your health care provider may ask you questions about your:  Alcohol use.  Tobacco use.  Drug use.  Emotional well-being.  Home and relationship well-being.  Sexual activity.  Eating habits.  History of falls.  Memory and ability to understand (cognition).  Work and work Statistician.  Reproductive health. Screening  You may have the following tests or measurements:  Height, weight, and BMI.  Blood pressure.  Lipid and cholesterol levels. These may be checked every 5 years, or more frequently if you are over 59 years old.  Skin check.  Lung cancer screening. You may have this screening every year starting at age 86 if you have a 30-pack-year history of smoking and currently smoke or have quit within the past 15 years.  Fecal occult blood test (FOBT) of the stool. You may have this test every year starting at age 46.  Flexible sigmoidoscopy or colonoscopy. You may have a sigmoidoscopy every 5 years or a colonoscopy every 10 years starting at age 70.  Hepatitis C blood test.  Hepatitis B blood test.  Sexually transmitted disease (STD) testing.  Diabetes screening. This is done by checking your blood sugar (glucose) after you have not eaten for a while (fasting). You may have this done every 1-3 years.  Bone density scan. This is done to screen for osteoporosis. You may have this done starting at age 41.  Mammogram. This may be done every 1-2 years. Talk to your health care provider about how often you should have regular mammograms. Talk with your health care provider about your test results, treatment options, and if necessary, the need for more tests. Vaccines  Your health care provider may recommend certain vaccines, such as:  Influenza vaccine. This is recommended  every year.  Tetanus, diphtheria, and acellular pertussis (Tdap, Td) vaccine. You may need a Td booster every 10 years.  Zoster vaccine. You may need this after age 33.  Pneumococcal 13-valent  conjugate (PCV13) vaccine. One dose is recommended after age 73.  Pneumococcal polysaccharide (PPSV23) vaccine. One dose is recommended after age 70. Talk to your health care provider about which screenings and vaccines you need and how often you need them. This information is not intended to replace advice given to you by your health care provider. Make sure you discuss any questions you have with your health care provider. Document Released: 07/02/2015 Document Revised: 02/23/2016 Document Reviewed: 04/06/2015 Elsevier Interactive Patient Education  2017 [place] Prevention in the Home Falls can cause injuries. They can happen to people of all ages. There are many things you can do to make your home safe and to help prevent falls. What can I do on the outside of my home?  Regularly fix the edges of walkways and driveways and fix any cracks.  Remove anything that might make you trip as you walk through a door, such as a raised step or threshold.  Trim any bushes or trees on the path to your home.  Use bright outdoor lighting.  Clear any walking paths of anything that might make someone trip, such as rocks or tools.  Regularly check to see if handrails are loose or broken. Make sure that both sides of any steps have handrails.  Any raised decks and porches should have guardrails on the edges.  Have any leaves, snow, or ice cleared regularly.  Use sand or salt on walking paths during winter.  Clean up any spills in your garage right away. This includes oil or grease spills. What can I do in the bathroom?  Use night lights.  Install grab bars by the toilet and in the tub and shower. Do not use towel bars as grab bars.  Use non-skid mats or decals in the tub or shower.  If you need to sit down in the shower, use a plastic, non-slip stool.  Keep the floor dry. Clean up any water that spills on the floor as soon as it happens.  Remove soap buildup in the tub or  shower regularly.  Attach bath mats securely with double-sided non-slip rug tape.  Do not have throw rugs and other things on the floor that can make you trip. What can I do in the bedroom?  Use night lights.  Make sure that you have a light by your bed that is easy to reach.  Do not use any sheets or blankets that are too big for your bed. They should not hang down onto the floor.  Have a firm chair that has side arms. You can use this for support while you get dressed.  Do not have throw rugs and other things on the floor that can make you trip. What can I do in the kitchen?  Clean up any spills right away.  Avoid walking on wet floors.  Keep items that you use a lot in easy-to-reach places.  If you need to reach something above you, use a strong step stool that has a grab bar.  Keep electrical cords out of the way.  Do not use floor polish or wax that makes floors slippery. If you must use wax, use non-skid floor wax.  Do not have throw rugs and other things on the floor that can make you trip. What  can I do with my stairs?  Do not leave any items on the stairs.  Make sure that there are handrails on both sides of the stairs and use them. Fix handrails that are broken or loose. Make sure that handrails are as long as the stairways.  Check any carpeting to make sure that it is firmly attached to the stairs. Fix any carpet that is loose or worn.  Avoid having throw rugs at the top or bottom of the stairs. If you do have throw rugs, attach them to the floor with carpet tape.  Make sure that you have a light switch at the top of the stairs and the bottom of the stairs. If you do not have them, ask someone to add them for you. What else can I do to help prevent falls?  Wear shoes that:  Do not have high heels.  Have rubber bottoms.  Are comfortable and fit you well.  Are closed at the toe. Do not wear sandals.  If you use a stepladder:  Make sure that it is fully  opened. Do not climb a closed stepladder.  Make sure that both sides of the stepladder are locked into place.  Ask someone to hold it for you, if possible.  Clearly mark and make sure that you can see:  Any grab bars or handrails.  First and last steps.  Where the edge of each step is.  Use tools that help you move around (mobility aids) if they are needed. These include:  Canes.  Walkers.  Scooters.  Crutches.  Turn on the lights when you go into a dark area. Replace any light bulbs as soon as they burn out.  Set up your furniture so you have a clear path. Avoid moving your furniture around.  If any of your floors are uneven, fix them.  If there are any pets around you, be aware of where they are.  Review your medicines with your doctor. Some medicines can make you feel dizzy. This can increase your chance of falling. Ask your doctor what other things that you can do to help prevent falls. This information is not intended to replace advice given to you by your health care provider. Make sure you discuss any questions you have with your health care provider. Document Released: 04/01/2009 Document Revised: 11/11/2015 Document Reviewed: 07/10/2014 Elsevier Interactive Patient Education  2017 [employer].

## 2020-07-08 ENCOUNTER — Telehealth: Payer: Self-pay | Admitting: Family Medicine

## 2020-07-08 NOTE — Telephone Encounter (Signed)
Pt called in stating that her scalp is itching, it started about a wk or so ago. She states that she did change shampoo . She wanted to know what she could do about this or if she needs to be seen in the office. Pt can be reached at the home # or the cell #

## 2020-07-09 NOTE — Telephone Encounter (Signed)
She should stop her new shampoo and return to her previous products if possible

## 2020-07-09 NOTE — Telephone Encounter (Signed)
Patient states that the itching was before the shampoo change. She thought if she changed the shampoo that the itching would stop and it has not. Please advise

## 2020-07-09 NOTE — Telephone Encounter (Signed)
We would need to see her scalp to determine next steps.  She can schedule at her convenience.

## 2020-07-09 NOTE — Telephone Encounter (Signed)
Patient is on the schedule to be seen 07/30/20 at 1pm

## 2020-07-13 ENCOUNTER — Telehealth: Payer: Self-pay

## 2020-07-13 NOTE — Chronic Care Management (AMB) (Signed)
Chronic Care Management Pharmacy Assistant   Name: [name]  MRN: [id number] DOB: 08/10/1940  Reason for Encounter: Disease State/ General Adherence Call  PCP : Midge Minium, MD  Allergies:   Allergies  Allergen Reactions  . Papain Anaphylaxis    Takes an extreme concentration  . Papaya Derivatives Anaphylaxis  . Mushroom Ext Cmplx(Shiitake-Reishi-Mait) Other (See Comments)    Head congestion and sore throat  . Mushroom Extract Complex Other (See Comments)    Head congestion and sore throat  . Venlafaxine Other (See Comments)    UNSPECIFIED REACTION  Pt states she can take Name brand. Lethargy    Medications: Outpatient Encounter Medications as of 07/13/2020  Medication Sig Note  . atorvastatin (LIPITOR) 20 MG tablet Take 1 tab daily by mouth   . Biotin 5000 MCG CAPS Take 1 capsule by mouth daily.   . cetirizine (ZYRTEC) 10 MG tablet Take by mouth.   . citalopram (CELEXA) 40 MG tablet TAKE ONE (1) TABLET BY MOUTH EVERY DAY   . famotidine (PEPCID) 40 MG tablet TAKE TWO TABLETS BY MOUTH EVERY NIGHT AT BEDTIME   . fenofibrate (TRICOR) 145 MG tablet TAKE ONE TABLET BY MOUTH EVERY MORNING AFTER BREAKFAST   . ibuprofen (ADVIL) 200 MG tablet Take 200 mg by mouth 2 (two) times daily.    Marland Kitchen LUMIGAN 0.01 % SOLN Place 1 drop into both eyes at bedtime.  10/14/2014: Received from: External Pharmacy  . Melatonin 10 MG CAPS Take by mouth.   . meloxicam (MOBIC) 7.5 MG tablet Take 1 tablet (7.5 mg total) by mouth daily. (Patient not taking: No sig reported)   . montelukast (SINGULAIR) 10 MG tablet Take 1 tablet (10 mg total) by mouth at bedtime.   . Multiple Vitamins-Minerals (CENTRUM SILVER PO) Take 1 tablet by mouth daily. (Patient not taking: No sig reported)   . nystatin (MYCOSTATIN/NYSTOP) powder Apply 1 application topically 3 (three) times daily.   Marland Kitchen nystatin ointment (MYCOSTATIN) Apply 1 application topically 2 (two) times daily.   Marland Kitchen saccharomyces boulardii (FLORASTOR)  250 MG capsule Take 1 capsule (250 mg total) by mouth 2 (two) times daily.   . sitaGLIPtin (JANUVIA) 100 MG tablet Take 1 tablet (100 mg total) by mouth daily.    No facility-administered encounter medications on file as of 07/13/2020.    Current Diagnosis: Patient Active Problem List   Diagnosis Date Noted  . Overweight (BMI 25.0-29.9) 06/01/2020  . Thyroid nodule 03/13/2018  . Chronic diarrhea 12/26/2017  . S/P shoulder replacement, right 09/15/2016  . Benign paroxysmal positional vertigo 10/14/2014  . Back pain 08/27/2014  . Physical exam 08/27/2014  . Decreased hearing of both ears 05/19/2014  . S/P cholecystectomy 10/02/2013  . Asthma, moderate persistent 05/16/2013  . H/O recurrent pneumonia 05/16/2013  . Insomnia 05/05/2013  . Gluten intolerance 05/06/2012  . Renal calculus, left 01/29/2012  . Painful bladder spasm 12/14/2011  . Lactose intolerance 12/14/2011  . Post-menopausal 12/14/2011  . Microscopic colitis 11/08/2011  . Hepatic steatosis 08/25/2011  . Pulmonary nodule, right 08/25/2011  . Hematuria 08/23/2011  . Memory loss 12/12/2010  . Diabetes mellitus type II, controlled, with no complications ([place]) 97/35/3299  . Hyperlipidemia 07/14/2010  . Depression with anxiety 07/14/2010  . GLAUCOMA 07/14/2010  . ALLERGIC RHINITIS 07/14/2010    Have you seen any other providers since your last visit with Madelin Rear, Pharm.D., BCGP?  06/01/2020 Adelina Mings, MD, 06/15/2020 Ok Edwards Lady Gary, NP Moderate persistent asthma , 06/28/2020  OV Stanley, Martha A, LNP, no medication changes noted.  Have you had any problems recently with your health?  Patient states she has recently had a problem with her scalp itching. Patient states she did change her hairspray and that seemed to help her itching. Patient also states she has had some stomach problems. Patient states she will have episodes of stomach pain that goes away and only last a "little while". Patient  states she has an appointment with her PCP Dr. Birdie Riddle next month in February to discuss these problems she is having.  Have you had any problems with your pharmacy?  Patient states she has not had any problems with her pharmacy.  What issues or side effects are you having with your medications?  Patient states she had to switch to Metformin due to Jardiance being too expensive. Patient states the medication was going to cost her over $400 a month if she did not switch. Patient states she has not yet started taking Metformin. Patient states she is supposed to start taking Metformin tomorrow (07/15/2020).  What would you like me to pass along to Madelin Rear, [place].D., BCGP for them to help you with?   Patient states she would like for me to pass along her recent problems with her health as mentioned above.  What can we do to take care of you better?  Patient states we are doing an excellent job!  April D Calhoun, [place] Pharmacist Assistant [phone]    Follow-Up:  Pharmacist Review

## 2020-07-14 DIAGNOSIS — Z20822 Contact with and (suspected) exposure to covid-19: Secondary | ICD-10-CM | POA: Diagnosis not present

## 2020-07-14 DIAGNOSIS — J029 Acute pharyngitis, unspecified: Secondary | ICD-10-CM | POA: Diagnosis not present

## 2020-07-14 DIAGNOSIS — R059 Cough, unspecified: Secondary | ICD-10-CM | POA: Diagnosis not present

## 2020-07-14 DIAGNOSIS — R0989 Other specified symptoms and signs involving the circulatory and respiratory systems: Secondary | ICD-10-CM | POA: Diagnosis not present

## 2020-07-19 ENCOUNTER — Telehealth: Payer: Self-pay | Admitting: Family Medicine

## 2020-07-19 DIAGNOSIS — M85852 Other specified disorders of bone density and structure, left thigh: Secondary | ICD-10-CM | POA: Diagnosis not present

## 2020-07-19 DIAGNOSIS — Z1231 Encounter for screening mammogram for malignant neoplasm of breast: Secondary | ICD-10-CM | POA: Diagnosis not present

## 2020-07-19 DIAGNOSIS — M85851 Other specified disorders of bone density and structure, right thigh: Secondary | ICD-10-CM | POA: Diagnosis not present

## 2020-07-19 LAB — HM DEXA SCAN

## 2020-07-19 NOTE — Telephone Encounter (Signed)
Error

## 2020-07-20 LAB — HM MAMMOGRAPHY

## 2020-07-21 DIAGNOSIS — M25512 Pain in left shoulder: Secondary | ICD-10-CM | POA: Diagnosis not present

## 2020-07-29 ENCOUNTER — Telehealth: Payer: Self-pay

## 2020-07-29 NOTE — Telephone Encounter (Signed)
Spoke with patient about dexa scan results which resulted to ostepenia and per provider pt is to take daily calcium +vitamin D. Pt understood. No concerns at this time.

## 2020-07-30 ENCOUNTER — Ambulatory Visit (INDEPENDENT_AMBULATORY_CARE_PROVIDER_SITE_OTHER): Payer: Medicare Other | Admitting: Family Medicine

## 2020-07-30 ENCOUNTER — Encounter: Payer: Self-pay | Admitting: Family Medicine

## 2020-07-30 ENCOUNTER — Other Ambulatory Visit: Payer: Self-pay

## 2020-07-30 VITALS — BP 112/82 | HR 73 | Temp 97.5°F | Resp 18 | Ht 62.0 in | Wt 158.8 lb

## 2020-07-30 DIAGNOSIS — K529 Noninfective gastroenteritis and colitis, unspecified: Secondary | ICD-10-CM

## 2020-07-30 DIAGNOSIS — L299 Pruritus, unspecified: Secondary | ICD-10-CM | POA: Diagnosis not present

## 2020-07-30 NOTE — Patient Instructions (Addendum)
Follow up as needed or as scheduled We'll call you with your GI appt Continue the Imodium to help slow your bowels RESTART a daily probiotic Drink plenty of fluids Your scalp looks great!  Take Claritin or Zyrtec to help w/ the itch Call with any questions or concerns Stay Safe!  Stay Healthy!

## 2020-07-30 NOTE — Progress Notes (Signed)
   Subjective:    Patient ID: [name], female    DOB: 1940/12/16, 80 y.o.   MRN: [id number]  HPI Dry scalp- pt reports it's better since calling.  Head continues to itch but denies flakes or scabs  Diarrhea- pt reports several months of sxs.  'it's just water and sand'.  Pt is having BM's ~6x/day and 'a couple times at night'.  Is having to wear pads but still needs to get up and change clothes at night.  Not currently on probiotic- she can't remember if this was helpful.  'this is the worst it's ever been'.  Increased gas recently.  S/p cholecystectomy.  Pt has been limiting her dairy intake.  Pt has hx of C diff.   Review of Systems For ROS see HPI   This visit occurred during the SARS-CoV-2 public health emergency.  Safety protocols were in place, including screening questions prior to the visit, additional usage of staff PPE, and extensive cleaning of exam room while observing appropriate contact time as indicated for disinfecting solutions.       Objective:   Physical Exam Vitals reviewed.  Constitutional:      General: She is not in acute distress.    Appearance: Normal appearance. She is not ill-appearing.  HENT:     Head: Normocephalic and atraumatic.     Comments: Scalp normal in appearance, no flakes, scabs, scaling Cardiovascular:     Pulses: Normal pulses.  Abdominal:     General: Abdomen is flat. There is no distension.     Palpations: Abdomen is soft.     Tenderness: There is no abdominal tenderness. There is no guarding.  Skin:    General: Skin is warm and dry.     Findings: No lesion or rash.  Neurological:     General: No focal deficit present.     Mental Status: She is alert and oriented to person, place, and time.           Assessment & Plan:  Itchy scalp- improved since pt scheduled appt.  No change in hair products.  No visible abnormality of scalp.  Encouraged adequate hydration and moisturizing shampoo.  Pt expressed understanding and is in  agreement w/ plan.   Chronic diarrhea- deteriorated.  Pt reports she has had several months of 'just water and sand' multiple times each day and night.  She is having to wear pads but still having issues staining clothes and messing the bed.  Increased gas recently.  Not currently on probiotic.  Is taking Imodium as prophylactic- which helps 'some'- but not enough.  She has hx of C diff- will repeat testing.  Will also do stool culture.  Refer back to GI as pt likely needs colonoscopy at this point.  Pt expressed understanding and is in agreement w/ plan.

## 2020-08-02 ENCOUNTER — Other Ambulatory Visit: Payer: Self-pay | Admitting: Family Medicine

## 2020-08-02 DIAGNOSIS — K529 Noninfective gastroenteritis and colitis, unspecified: Secondary | ICD-10-CM | POA: Diagnosis not present

## 2020-08-04 LAB — CLOSTRIDIUM DIFFICILE BY PCR: Toxigenic C. Difficile by PCR: NEGATIVE

## 2020-08-05 ENCOUNTER — Telehealth: Payer: Self-pay | Admitting: Family Medicine

## 2020-08-05 NOTE — Telephone Encounter (Signed)
Patient would like to know if you have the results back from her lab work.  Please advise.

## 2020-08-06 ENCOUNTER — Telehealth: Payer: Self-pay

## 2020-08-06 DIAGNOSIS — M25512 Pain in left shoulder: Secondary | ICD-10-CM | POA: Diagnosis not present

## 2020-08-06 NOTE — Telephone Encounter (Signed)
Spoke with patient and gave her the lab results and also made sure GI contacted her to schedule her appointment.

## 2020-08-06 NOTE — Telephone Encounter (Signed)
Called and left patient a vm to return call to office regarding dexa scan results.

## 2020-08-07 LAB — STOOL CULTURE: E coli, Shiga toxin Assay: NEGATIVE

## 2020-08-10 DIAGNOSIS — M1992 Post-traumatic osteoarthritis, unspecified site: Secondary | ICD-10-CM | POA: Insufficient documentation

## 2020-08-10 DIAGNOSIS — M19112 Post-traumatic osteoarthritis, left shoulder: Secondary | ICD-10-CM | POA: Diagnosis not present

## 2020-08-18 ENCOUNTER — Other Ambulatory Visit: Payer: Self-pay | Admitting: Family Medicine

## 2020-08-20 ENCOUNTER — Ambulatory Visit (INDEPENDENT_AMBULATORY_CARE_PROVIDER_SITE_OTHER): Payer: Medicare Other | Admitting: Physician Assistant

## 2020-08-20 ENCOUNTER — Encounter: Payer: Self-pay | Admitting: Physician Assistant

## 2020-08-20 ENCOUNTER — Other Ambulatory Visit: Payer: Self-pay

## 2020-08-20 VITALS — BP 110/64 | HR 70 | Ht 62.0 in | Wt 160.0 lb

## 2020-08-20 DIAGNOSIS — K529 Noninfective gastroenteritis and colitis, unspecified: Secondary | ICD-10-CM

## 2020-08-20 MED ORDER — BUDESONIDE 3 MG PO CPEP: 9.0000 mg | ORAL_CAPSULE | Freq: Every day | ORAL | 2 refills | Status: DC

## 2020-08-20 NOTE — Progress Notes (Signed)
Chief Complaint: Chronic diarrhea  Review of pertinent gastrointestinal problems:  1. routine risk for colon cancer, no polyps on colonoscopy April 2013  2. microscopic, lymphocytic colitis noted on biopsies, colonoscopy April 2013 with normal colon, normal terminal ileum, random colon biopsies performed. Started on budesonide 9mg  a day 3.  C. difficile 2019 also 2020  HPI:    [name] is a an 80 year old Caucasian female with a past medical history as listed below including diabetes, known to Dr. Ardis Hughs, who presents to clinic today for follow-up of her diarrhea.    08/15/19 patient seen in clinic by Dr. Ardis Hughs. At that time was noted that possibly several factors could be contributing to diarrhea including lymphocytic colitis diagnosis, status post cholecystectomy and Metformin use. Also history of C. difficile. At time of follow-up she was doing well with one solid stool in the morning and sometimes a couple looser ones later in the morning. She had been on vancomycin for C. difficile. Though she had never really taken it correctly. Colon cancer screening was discussed and patient declined.    08/02/20 C. difficile negative and stool culture negative.    Today, the patient tells me she has had chronic diarrhea for whole whole life but recently this has gotten some worse over the past couple of months or so.  Tells me that she started taking an Imodium once in the morning as well as 1-2 probiotics, "colon health", which has been helping over the past few weeks.  Describes that yesterday she had an almost normal stool in the morning and then a bowel movement which was "water and sand" in the evening.  This morning she had a normal stool.  Tells me though that it was so bad at one point that she could not even leave her house and would wake her up from her sleep in the middle of the night and is wondering what could cause all of this.    Denies fever, chills, weight loss or blood in her stool.      Past Medical History:  Diagnosis Date  . Allergic rhinitis   . Asthma   . Diabetes mellitus    just changed from injection to metformin ? month  . Dysrhythmia    palpitations  . Glaucoma   . History of kidney stones   . Hyperlipemia   . Migraines   . Palpitations   . Pneumonia    hx  . Urinary tract infection     Past Surgical History:  Procedure Laterality Date  . ABDOMINAL HYSTERECTOMY     Has ovaries  . BACK SURGERY  1987   lumb  . BREAST SURGERY Left 07/2016   bx- needle in breast  . CHOLECYSTECTOMY  09/26/13  . CLOSED REDUCTION NASAL FRACTURE  04/29/2012   Procedure: CLOSED REDUCTION NASAL FRACTURE;  Surgeon: Jodi Marble, MD;  Location: [facility];  Service: ENT;  Laterality: N/A;  WITH STABILIZATION  . COLONOSCOPY    . Deviated septum repair    . EYE SURGERY Bilateral    cataracts  . LITHOTRIPSY    . REVERSE SHOULDER ARTHROPLASTY Right 09/15/2016   Procedure: RIGHT REVERSE SHOULDER ARTHROPLASTY;  Surgeon: Netta Cedars, MD;  Location: [place];  Service: Orthopedics;  Laterality: Right;  . ROTATOR CUFF REPAIR     right  . TONSILLECTOMY      Current Outpatient Medications  Medication Sig Dispense Refill  . atorvastatin (LIPITOR) 20 MG tablet Take 1 tab daily by mouth 90  tablet 1  . Biotin 5000 MCG CAPS Take 1 capsule by mouth daily.    . cetirizine (ZYRTEC) 10 MG tablet Take by mouth.    . citalopram (CELEXA) 40 MG tablet TAKE ONE (1) TABLET BY MOUTH EVERY DAY 90 tablet 1  . famotidine (PEPCID) 40 MG tablet TAKE TWO TABLETS BY MOUTH EVERY NIGHT AT BEDTIME 180 tablet 1  . fenofibrate (TRICOR) 145 MG tablet TAKE ONE TABLET BY MOUTH EVERY MORNING AFTER BREAKFAST 30 tablet 6  . ibuprofen (ADVIL) 200 MG tablet Take 200 mg by mouth 2 (two) times daily.     Marland Kitchen LUMIGAN 0.01 % SOLN Place 1 drop into both eyes at bedtime.     . Melatonin 10 MG CAPS Take by mouth.    . meloxicam (MOBIC) 7.5 MG tablet Take 1 tablet (7.5 mg total) by mouth daily. (Patient not  taking: No sig reported) 15 tablet 0  . montelukast (SINGULAIR) 10 MG tablet Take 1 tablet (10 mg total) by mouth at bedtime. 90 tablet 3  . Multiple Vitamins-Minerals (CENTRUM SILVER PO) Take 1 tablet by mouth daily. (Patient not taking: No sig reported)    . nystatin (MYCOSTATIN/NYSTOP) powder Apply 1 application topically 3 (three) times daily. 60 g 1  . nystatin ointment (MYCOSTATIN) Apply 1 application topically 2 (two) times daily. 30 g 1  . saccharomyces boulardii (FLORASTOR) 250 MG capsule Take 1 capsule (250 mg total) by mouth 2 (two) times daily. 180 capsule 0  . sitaGLIPtin (JANUVIA) 100 MG tablet Take 1 tablet (100 mg total) by mouth daily. (Patient not taking: Reported on 07/30/2020) 90 tablet 0   No current facility-administered medications for this visit.    Allergies as of 08/20/2020 - Review Complete 08/20/2020  Allergen Reaction Noted  . Papain Anaphylaxis 01/29/2012  . Papaya derivatives Anaphylaxis 08/23/2011  . Mushroom ext cmplx(shiitake-reishi-mait) Other (See Comments) 08/23/2011  . Mushroom extract complex Other (See Comments) 11/18/2014  . Venlafaxine Other (See Comments) 08/23/2011    Family History  Problem Relation Age of Onset  . Diabetes Father   . Colon cancer Neg Hx   . Esophageal cancer Neg Hx   . Rectal cancer Neg Hx   . Stomach cancer Neg Hx   . Breast cancer Neg Hx     Social History   Socioeconomic History  . Marital status: Widowed    Spouse name: Not on file  . Number of children: 2  . Years of education: Not on file  . Highest education level: Not on file  Occupational History  . Occupation: Retired -Sports coach: RETIRED  Tobacco Use  . Smoking status: Former Smoker    Packs/day: 0.10    Years: 4.00    Pack years: 0.40    Types: Cigarettes    Quit date: 06/19/1986    Years since quitting: 34.1  . Smokeless tobacco: Never Used  . Tobacco comment: smoked in college years ago  Vaping Use  . Vaping Use: Never used   Substance and Sexual Activity  . Alcohol use: Yes    Comment: 1-2 glasses week variety beer occ scotch  . Drug use: No  . Sexual activity: Not Currently  Other Topics Concern  . Not on file  Social History Narrative   Daily caffeine    Social Determinants of Health   Financial Resource Strain: Low Risk   . Difficulty of Paying Living Expenses: Not hard at all  Food Insecurity: No Food Insecurity  . Worried About  Running Out of Food in the Last Year: Never true  . Ran Out of Food in the Last Year: Never true  Transportation Needs: No Transportation Needs  . Lack of Transportation (Medical): No  . Lack of Transportation (Non-Medical): No  Physical Activity: Sufficiently Active  . Days of Exercise per Week: 3 days  . Minutes of Exercise per Session: 50 min  Stress: No Stress Concern Present  . Feeling of Stress : Not at all  Social Connections: Moderately Integrated  . Frequency of Communication with Friends and Family: More than three times a week  . Frequency of Social Gatherings with Friends and Family: More than three times a week  . Attends Religious Services: More than 4 times per year  . Active Member of Clubs or Organizations: Yes  . Attends Archivist Meetings: 1 to 4 times per year  . Marital Status: Widowed  Intimate Partner Violence: Not At Risk  . Fear of Current or Ex-Partner: No  . Emotionally Abused: No  . Physically Abused: No  . Sexually Abused: No    Review of Systems:    Constitutional: No weight loss, fever or chills Cardiovascular: No chest pain Respiratory: No SOB Gastrointestinal: See HPI and otherwise negative   Physical Exam:  Vital signs: BP 110/64   Pulse 70   Ht 5\' 2"  (1.575 m)   Wt 160 lb (72.6 kg)   BMI 29.26 kg/m   Constitutional:   Very Pleasant Elderly Caucasian female appears to be in NAD, Well developed, Well nourished, alert and cooperative Respiratory: Respirations even and unlabored. Lungs clear to auscultation  bilaterally.   No wheezes, crackles, or rhonchi.  Cardiovascular: Normal S1, S2. No MRG. Regular rate and rhythm. No peripheral edema, cyanosis or pallor.  Gastrointestinal:  Soft, nondistended, nontender. No rebound or guarding. Normal bowel sounds. No appreciable masses or hepatomegaly. Rectal:  Not performed.  Psychiatric:  Demonstrates good judgement and reason without abnormal affect or behaviors.  RELEVANT LABS AND IMAGING: CBC    Component Value Date/Time   WBC 7.6 06/01/2020 1037   RBC 4.49 06/01/2020 1037   HGB 13.0 06/01/2020 1037   HCT 39.4 06/01/2020 1037   PLT 271.0 06/01/2020 1037   MCV 87.9 06/01/2020 1037   MCH 28.9 09/08/2016 1039   MCHC 33.0 06/01/2020 1037   RDW 13.4 06/01/2020 1037   LYMPHSABS 1.6 06/01/2020 1037   MONOABS 0.5 06/01/2020 1037   EOSABS 0.3 06/01/2020 1037   BASOSABS 0.1 06/01/2020 1037    CMP     Component Value Date/Time   NA 138 06/01/2020 1037   K 4.3 06/01/2020 1037   CL 104 06/01/2020 1037   CO2 25 06/01/2020 1037   GLUCOSE 104 (H) 06/01/2020 1037   BUN 21 06/01/2020 1037   CREATININE 0.89 06/01/2020 1037   CREATININE 0.90 08/15/2017 1533   CALCIUM 10.2 06/01/2020 1037   PROT 7.0 06/01/2020 1037   ALBUMIN 4.5 06/01/2020 1037   AST 17 06/01/2020 1037   ALT 14 06/01/2020 1037   ALKPHOS 41 06/01/2020 1037   BILITOT 0.4 06/01/2020 1037   GFRNONAA >60 09/16/2016 0409   GFRAA >60 09/16/2016 0409    Assessment: 1.  Chronic diarrhea: History of C. difficile, Metformin usage and collagenous colitis, most recently Metformin has been on hold and C. difficile test was negative; consider most likely collagenous colitis  Plan: 1.  Prescribed Budesonide 9 mg daily for the next 4 to 6 weeks.  We can then discuss tapering down pending  results. 2.  Encouraged the patient to use Imodium for breakthrough as needed, but hopefully she will not need it with above. 3.  Patient to follow in clinic with me in 4 to 6 weeks or sooner if  necessary.  Karen Newer, PA-C [facility] 08/20/2020, 11:13 AM  Cc: Midge Minium, MD

## 2020-08-20 NOTE — Patient Instructions (Signed)
If you are age 81 or older, your body mass index should be between 23-30. Your Body mass index is 29.26 kg/m. If this is out of the aforementioned range listed, please consider follow up with your Primary Care Provider.  If you are age 86 or younger, your body mass index should be between 19-25. Your Body mass index is 29.26 kg/m. If this is out of the aformentioned range listed, please consider follow up with your Primary Care Provider.   We have sent the following medications to your pharmacy for you to pick up at your convenience: Budesonide 9 mg once daily   Thank you for choosing me and [facility].  Ellouise Newer, PA-C

## 2020-08-20 NOTE — Progress Notes (Signed)
I agree with the above note, plan 

## 2020-08-30 DIAGNOSIS — M9905 Segmental and somatic dysfunction of pelvic region: Secondary | ICD-10-CM | POA: Diagnosis not present

## 2020-08-30 DIAGNOSIS — M546 Pain in thoracic spine: Secondary | ICD-10-CM | POA: Diagnosis not present

## 2020-08-30 DIAGNOSIS — M5451 Vertebrogenic low back pain: Secondary | ICD-10-CM | POA: Diagnosis not present

## 2020-08-30 DIAGNOSIS — M9903 Segmental and somatic dysfunction of lumbar region: Secondary | ICD-10-CM | POA: Diagnosis not present

## 2020-08-30 DIAGNOSIS — M5126 Other intervertebral disc displacement, lumbar region: Secondary | ICD-10-CM | POA: Diagnosis not present

## 2020-08-30 DIAGNOSIS — M9902 Segmental and somatic dysfunction of thoracic region: Secondary | ICD-10-CM | POA: Diagnosis not present

## 2020-08-30 DIAGNOSIS — M542 Cervicalgia: Secondary | ICD-10-CM | POA: Diagnosis not present

## 2020-08-30 DIAGNOSIS — M9901 Segmental and somatic dysfunction of cervical region: Secondary | ICD-10-CM | POA: Diagnosis not present

## 2020-09-22 ENCOUNTER — Telehealth: Payer: Self-pay | Admitting: Family Medicine

## 2020-09-22 NOTE — Telephone Encounter (Signed)
I have placed a preoperative risk eval. Form, from [facility] in the bin upfront with a charge sheet.   Please advise

## 2020-09-22 NOTE — Telephone Encounter (Signed)
Forms placed in bin to be filled 

## 2020-09-23 DIAGNOSIS — H401131 Primary open-angle glaucoma, bilateral, mild stage: Secondary | ICD-10-CM | POA: Diagnosis not present

## 2020-09-23 DIAGNOSIS — H21512 Anterior synechiae (iris), left eye: Secondary | ICD-10-CM | POA: Diagnosis not present

## 2020-09-23 NOTE — Telephone Encounter (Signed)
Form completed.  Note and labs from 06/01/20 will need to be printed and sent with the clearance form

## 2020-09-23 NOTE — Telephone Encounter (Signed)
Judson Roch has faxed over the forms and office notes and labs.

## 2020-09-24 ENCOUNTER — Encounter: Payer: Self-pay | Admitting: Physician Assistant

## 2020-09-24 ENCOUNTER — Other Ambulatory Visit: Payer: Self-pay

## 2020-09-24 ENCOUNTER — Ambulatory Visit (INDEPENDENT_AMBULATORY_CARE_PROVIDER_SITE_OTHER): Payer: Medicare Other | Admitting: Physician Assistant

## 2020-09-24 VITALS — BP 140/80 | HR 71 | Ht 62.0 in | Wt 161.0 lb

## 2020-09-24 DIAGNOSIS — K52831 Collagenous colitis: Secondary | ICD-10-CM | POA: Diagnosis not present

## 2020-09-24 MED ORDER — BUDESONIDE 3 MG PO CPEP: 9.0000 mg | ORAL_CAPSULE | Freq: Every day | ORAL | 0 refills | Status: DC

## 2020-09-24 NOTE — Progress Notes (Signed)
I agree with the above note, plan 

## 2020-09-24 NOTE — Patient Instructions (Signed)
If you are age 81 or older, your body mass index should be between 23-30. Your Body mass index is 29.45 kg/m. If this is out of the aforementioned range listed, please consider follow up with your Primary Care Provider.  If you are age 5 or younger, your body mass index should be between 19-25. Your Body mass index is 29.45 kg/m. If this is out of the aformentioned range listed, please consider follow up with your Primary Care Provider.   Budesonide 9 mg X 8 weeks total  Then 6 mg once daily for 2 weeks   Then 3 mg once daily for 2 weeks then stop.  Follow up as needed.  Thank you for choosing me and [facility].  Ellouise Newer, PA-C

## 2020-09-24 NOTE — Progress Notes (Signed)
Chief Complaint: Follow-up chronic diarrhea  Review of pertinent gastrointestinal problems:  1. routine risk for colon cancer, no polyps on colonoscopy April 2013  2. microscopic, lymphocytic colitis noted on biopsies, colonoscopy April 2013 with normal colon, normal terminal ileum, random colon biopsies performed. Started on budesonide 9mg  a day 3.  C. difficile 2019 also 2020  HPI:    [name] is an 80 year old Caucasian female with a past medical history as listed below including diabetes, known to Dr. Ardis Hughs, who returns to clinic today for follow-up of her chronic diarrhea.    08/15/19 patient seen in clinic by Dr. Ardis Hughs. At that time was noted that possibly several factors could be contributing to diarrhea including lymphocytic colitis diagnosis, status post cholecystectomy and Metformin use. Also history of C. difficile. At time of follow-up she was doing well with one solid stool in the morning and sometimes a couple looser ones later in the morning. She had been on vancomycin for C. difficile. Though she had never really taken it correctly. Colon cancer screening was discussed and patient declined.    08/02/20 C. difficile negative and stool culture negative.    08/20/2020 patient seen in clinic for her chronic diarrhea.  Described to gotten worse over the past couple of months and she is started taking Imodium once in the morning as well as 1-2 probiotics.  At that time it was thought this was likely connected to her diagnosis of collagenous colitis and she was prescribed budesonide 9 mg daily for the next 4 to 6 weeks.  Encouraged patient to use Imodium for breakthrough as needed.    Today, the patient presents to clinic and tells me that she is 100% better after being on the Budesonide 9 mg once daily.  She is very happy that things are going so well.  Denies any abdominal pain, she has maybe 1-2 bowel movements a day but they are solid.  No problems.    Discusses that she loves to eat,  breakfast, lunch, dinner and a lunch snack and dinner snack.  She loves to cook as well and loves to eat fish.  Recommends the [place] at [employer] in [place].    Denies fever, chills, blood in her stool, weight loss or abdominal pain peer  Past Medical History:  Diagnosis Date  . Allergic rhinitis   . Asthma   . Diabetes mellitus    just changed from injection to metformin ? month  . Dysrhythmia    palpitations  . Glaucoma   . History of kidney stones   . Hyperlipemia   . Migraines   . Palpitations   . Pneumonia    hx  . Urinary tract infection     Past Surgical History:  Procedure Laterality Date  . ABDOMINAL HYSTERECTOMY     Has ovaries  . BACK SURGERY  1987   lumb  . BREAST SURGERY Left 07/2016   bx- needle in breast  . CHOLECYSTECTOMY  09/26/13  . CLOSED REDUCTION NASAL FRACTURE  04/29/2012   Procedure: CLOSED REDUCTION NASAL FRACTURE;  Surgeon: Jodi Marble, MD;  Location: [place];  Service: ENT;  Laterality: N/A;  WITH STABILIZATION  . COLONOSCOPY    . Deviated septum repair    . EYE SURGERY Bilateral    cataracts  . LITHOTRIPSY    . REVERSE SHOULDER ARTHROPLASTY Right 09/15/2016   Procedure: RIGHT REVERSE SHOULDER ARTHROPLASTY;  Surgeon: Netta Cedars, MD;  Location: [place];  Service: Orthopedics;  Laterality: Right;  .  ROTATOR CUFF REPAIR     right  . TONSILLECTOMY      Current Outpatient Medications  Medication Sig Dispense Refill  . atorvastatin (LIPITOR) 20 MG tablet Take 1 tab daily by mouth 90 tablet 1  . Biotin 5000 MCG CAPS Take 1 capsule by mouth daily.    . budesonide (ENTOCORT EC) 3 MG 24 hr capsule Take 3 capsules (9 mg total) by mouth daily. 90 capsule 2  . citalopram (CELEXA) 40 MG tablet TAKE ONE (1) TABLET BY MOUTH EVERY DAY 90 tablet 1  . famotidine (PEPCID) 40 MG tablet TAKE TWO TABLETS BY MOUTH EVERY NIGHT AT BEDTIME 180 tablet 1  . ibuprofen (ADVIL) 200 MG tablet Take 200 mg by mouth 2 (two) times daily.     Marland Kitchen  LUMIGAN 0.01 % SOLN Place 1 drop into both eyes at bedtime.     . Melatonin 10 MG CAPS Take by mouth.    . montelukast (SINGULAIR) 10 MG tablet Take 1 tablet (10 mg total) by mouth at bedtime. 90 tablet 3  . Multiple Vitamins-Minerals (CENTRUM SILVER PO) Take 1 tablet by mouth daily.    Marland Kitchen saccharomyces boulardii (FLORASTOR) 250 MG capsule Take 1 capsule (250 mg total) by mouth 2 (two) times daily. 180 capsule 0   No current facility-administered medications for this visit.    Allergies as of 09/24/2020 - Review Complete 08/20/2020  Allergen Reaction Noted  . Papain Anaphylaxis 01/29/2012  . Papaya derivatives Anaphylaxis 08/23/2011  . Mushroom ext cmplx(shiitake-reishi-mait) Other (See Comments) 08/23/2011  . Mushroom extract complex Other (See Comments) 11/18/2014  . Venlafaxine Other (See Comments) 08/23/2011    Family History  Problem Relation Age of Onset  . Diabetes Father   . Colon cancer Neg Hx   . Esophageal cancer Neg Hx   . Rectal cancer Neg Hx   . Stomach cancer Neg Hx   . Breast cancer Neg Hx     Social History   Socioeconomic History  . Marital status: Widowed    Spouse name: Not on file  . Number of children: 2  . Years of education: Not on file  . Highest education level: Not on file  Occupational History  . Occupation: Retired -Sports coach: RETIRED  Tobacco Use  . Smoking status: Former Smoker    Packs/day: 0.10    Years: 4.00    Pack years: 0.40    Types: Cigarettes    Quit date: 06/19/1986    Years since quitting: 34.2  . Smokeless tobacco: Never Used  . Tobacco comment: smoked in college years ago  Vaping Use  . Vaping Use: Never used  Substance and Sexual Activity  . Alcohol use: Yes    Comment: 1-2 glasses week variety beer occ scotch  . Drug use: No  . Sexual activity: Not Currently  Other Topics Concern  . Not on file  Social History Narrative   Daily caffeine    Social Determinants of Health   Financial Resource Strain:  Low Risk   . Difficulty of Paying Living Expenses: Not hard at all  Food Insecurity: No Food Insecurity  . Worried About Charity fundraiser in the Last Year: Never true  . Ran Out of Food in the Last Year: Never true  Transportation Needs: No Transportation Needs  . Lack of Transportation (Medical): No  . Lack of Transportation (Non-Medical): No  Physical Activity: Sufficiently Active  . Days of Exercise per Week: 3 days  . Minutes  of Exercise per Session: 50 min  Stress: No Stress Concern Present  . Feeling of Stress : Not at all  Social Connections: Moderately Integrated  . Frequency of Communication with Friends and Family: More than three times a week  . Frequency of Social Gatherings with Friends and Family: More than three times a week  . Attends Religious Services: More than 4 times per year  . Active Member of Clubs or Organizations: Yes  . Attends Archivist Meetings: 1 to 4 times per year  . Marital Status: Widowed  Intimate Partner Violence: Not At Risk  . Fear of Current or Ex-Partner: No  . Emotionally Abused: No  . Physically Abused: No  . Sexually Abused: No    Review of Systems:    Constitutional: No weight loss, fever or chills Cardiovascular: No chest pain  Respiratory: No SOB  Gastrointestinal: See HPI and otherwise negative   Physical Exam:  Vital signs: BP 140/80   Pulse 71   Ht 5\' 2"  (1.575 m)   Wt 161 lb (73 kg)   BMI 29.45 kg/m   Constitutional:   Pleasant Caucasian female appears to be in NAD, Well developed, Well nourished, alert and cooperative Respiratory: Respirations even and unlabored. Lungs clear to auscultation bilaterally.   No wheezes, crackles, or rhonchi.  Cardiovascular: Normal S1, S2. No MRG. Regular rate and rhythm. No peripheral edema, cyanosis or pallor.  Gastrointestinal:  Soft, nondistended, nontender. No rebound or guarding. Normal bowel sounds. No appreciable masses or hepatomegaly. Rectal:  Not performed.   Psychiatric:  Demonstrates good judgement and reason without abnormal affect or behaviors.  No recent labs or imaging.  Assessment: 1.  Chronic diarrhea: History of collagenous colitis, symptoms are much better on Budesonide 9 mg daily  Plan: 1.  Told the patient to continue her Budesonide 9 mg once daily for total of 8 weeks, then she should decrease to 6 mg for 2 weeks and then 3 mg for 2 weeks.  We will see how she is doing after finishing this medication.  She does tell me it is expensive for her but she is happy with her result so she will continue it. 2.  Patient to follow-up with [place] as needed in the future  Ellouise Newer, [facility] 09/24/2020, 1:59 PM  Cc: Midge Minium, MD

## 2020-10-06 DIAGNOSIS — Z23 Encounter for immunization: Secondary | ICD-10-CM | POA: Diagnosis not present

## 2020-10-11 NOTE — Progress Notes (Signed)
Please enter orders for surgery scheduled for 10/15/20.

## 2020-10-11 NOTE — Progress Notes (Signed)
COVID Vaccine Completed:  x2 Date COVID Vaccine completed: 07-29-19, 08-12-19 Has received booster: COVID vaccine manufacturer:     Moderna    Date of COVID positive in last 90 days:  PCP - Annye Asa, MD Cardiologist - Darlina Guys, MD.  Saw in 2012  Chest x-ray -  EKG -  Stress Test -  ECHO - 2012 Epic Cardiac Cath -  Pacemaker/ICD device last checked: Spinal Cord Stimulator:  Sleep Study -  CPAP -   Fasting Blood Sugar -  Checks Blood Sugar _____ times a day  Blood Thinner Instructions: Aspirin Instructions: Last Dose:  Activity level:  Can go up a flight of stairs and perform activities of daily living without stopping and without symptoms of chest pain or shortness of breath.   Able to exercise without symptoms  Unable to go up a flight of stairs without symptoms of      Anesthesia review:   Patient denies shortness of breath, fever, cough and chest pain at PAT appointment   Patient verbalized understanding of instructions that were given to them at the PAT appointment. Patient was also instructed that they will need to review over the PAT instructions again at home before surgery.

## 2020-10-12 NOTE — H&P (Signed)
Patient's anticipated LOS is less than 2 midnights, meeting these requirements: - Younger than 37 - Lives within 1 hour of care - Has a competent adult at home to recover with post-op recover - NO history of  - Chronic pain requiring opiods  - Diabetes  - Coronary Artery Disease  - Heart failure  - Heart attack  - Stroke  - DVT/VTE  - Cardiac arrhythmia  - Respiratory Failure/COPD  - Renal failure  - Anemia  - Advanced Liver disease       [name] is an 80 y.o. female.    Chief Complaint: left shoulder pain  HPI: Pt is a 80 y.o. female complaining of left shoulder pain for multiple years. Pain had continually increased since the beginning. X-rays in the clinic show end-stage arthritic changes of the left shoulder. Pt has tried various conservative treatments which have failed to alleviate their symptoms, including injections and therapy. Various options are discussed with the patient. Risks, benefits and expectations were discussed with the patient. Patient understand the risks, benefits and expectations and wishes to proceed with surgery.   PCP:  Midge Minium, MD  D/C Plans: Home  PMH: Past Medical History:  Diagnosis Date  . Allergic rhinitis   . Asthma   . Diabetes mellitus    just changed from injection to metformin ? month  . Dysrhythmia    palpitations  . Glaucoma   . History of kidney stones   . Hyperlipemia   . Migraines   . Palpitations   . Pneumonia    hx  . Urinary tract infection     PSH: Past Surgical History:  Procedure Laterality Date  . ABDOMINAL HYSTERECTOMY     Has ovaries  . BACK SURGERY  1987   lumb  . BREAST SURGERY Left 07/2016   bx- needle in breast  . CHOLECYSTECTOMY  09/26/13  . CLOSED REDUCTION NASAL FRACTURE  04/29/2012   Procedure: CLOSED REDUCTION NASAL FRACTURE;  Surgeon: Jodi Marble, MD;  Location: [facility];  Service: ENT;  Laterality: N/A;  WITH STABILIZATION  . COLONOSCOPY    . Deviated  septum repair    . EYE SURGERY Bilateral    cataracts  . LITHOTRIPSY    . REVERSE SHOULDER ARTHROPLASTY Right 09/15/2016   Procedure: RIGHT REVERSE SHOULDER ARTHROPLASTY;  Surgeon: Netta Cedars, MD;  Location: [facility];  Service: Orthopedics;  Laterality: Right;  . ROTATOR CUFF REPAIR     right  . TONSILLECTOMY      Social History:  reports that she quit smoking about 34 years ago. Her smoking use included cigarettes. She has a 0.40 pack-year smoking history. She has never used smokeless tobacco. She reports current alcohol use. She reports that she does not use drugs.  Allergies:  Allergies  Allergen Reactions  . Papain Anaphylaxis    Takes an extreme concentration  . Papaya Derivatives Anaphylaxis  . Mushroom Ext Cmplx(Shiitake-Reishi-Mait) Other (See Comments)    Head congestion and sore throat  . Mushroom Extract Complex Other (See Comments)    Head congestion and sore throat  . Venlafaxine Other (See Comments)    UNSPECIFIED REACTION  Pt states she can take Name brand. Lethargy    Medications: No current facility-administered medications for this encounter.   Current Outpatient Medications  Medication Sig Dispense Refill  . atorvastatin (LIPITOR) 20 MG tablet Take 1 tab daily by mouth 90 tablet 1  . Biotin 5000 MCG CAPS Take 1 capsule by mouth daily.    Marland Kitchen  budesonide (ENTOCORT EC) 3 MG 24 hr capsule Take 3 capsules (9 mg total) by mouth daily. 42 capsule 0  . citalopram (CELEXA) 40 MG tablet TAKE ONE (1) TABLET BY MOUTH EVERY DAY 90 tablet 1  . famotidine (PEPCID) 40 MG tablet TAKE TWO TABLETS BY MOUTH EVERY NIGHT AT BEDTIME 180 tablet 1  . ibuprofen (ADVIL) 200 MG tablet Take 200 mg by mouth 2 (two) times daily.     Marland Kitchen LUMIGAN 0.01 % SOLN Place 1 drop into both eyes at bedtime.     . magnesium 30 MG tablet Take 30 mg by mouth 2 (two) times daily.    . Melatonin 10 MG CAPS Take by mouth.    . metFORMIN (GLUCOPHAGE) 1000 MG tablet Take 1 tablet by mouth 2 (two) times daily.     . montelukast (SINGULAIR) 10 MG tablet Take 1 tablet (10 mg total) by mouth at bedtime. 90 tablet 3  . Multiple Vitamins-Minerals (CENTRUM SILVER PO) Take 1 tablet by mouth daily.    Marland Kitchen saccharomyces boulardii (FLORASTOR) 250 MG capsule Take 1 capsule (250 mg total) by mouth 2 (two) times daily. 180 capsule 0    No results found for this or any previous visit (from the past 48 hour(s)). No results found.  ROS: Pain with rom of the left upper extremity  Physical Exam: Alert and oriented 80 y.o. female in no acute distress Cranial nerves 2-12 intact Cervical spine: full rom with no tenderness, nv intact distally Chest: active breath sounds bilaterally, no wheeze rhonchi or rales Heart: regular rate and rhythm, no murmur Abd: non tender non distended with active bowel sounds Hip is stable with rom  Left shoulder painful and weak rom nv intact distally No rashes or edema distally  Assessment/Plan Assessment: left shoulder cuff arthropathy  Plan:  Patient will undergo a left reverse total shoulder by Dr. Veverly Fells at [facility] Risks benefits and expectations were discussed with the patient. Patient understand risks, benefits and expectations and wishes to proceed. Preoperative templating of the joint replacement has been completed, documented, and submitted to the Operating Room personnel in order to optimize intra-operative equipment management.   Merla Riches PA-C, MPAS [facility] is now [employer] [address], [place], [place], [place] [address] Phone: [phone] [email or web address] Facebook  [employer]

## 2020-10-13 ENCOUNTER — Encounter (HOSPITAL_COMMUNITY): Payer: Self-pay | Admitting: Orthopedic Surgery

## 2020-10-13 ENCOUNTER — Telehealth: Payer: Self-pay | Admitting: Family Medicine

## 2020-10-13 ENCOUNTER — Other Ambulatory Visit: Payer: Self-pay

## 2020-10-13 NOTE — Progress Notes (Addendum)
COVID Vaccine Completed:  x2 Date COVID Vaccine completed: 07-29-19, 08-12-19 Has received booster: x2 10/12/2020 COVID vaccine manufacturer:     Moderna    Date of COVID positive in last 90 days: N/A  PCP - Annye Asa, MD Cardiologist - Darlina Guys, MD.  Saw in 2012  Chest x-ray - greater than 1 year EKG - greater than 1 year Stress Test - N/A ECHO - 2012 Epic Cardiac Cath - N/A Pacemaker/ICD device last checked:N/A Spinal Cord Stimulator: N/A  Sleep Study - N/A CPAP - N/A  Fasting Blood Sugar - N/A Checks Blood Sugar __N/A___ times a day  Blood Thinner Instructions: N/A Aspirin Instructions: N/A Last Dose:N/A  Activity level: Able to exercise without symptoms                         Anesthesia review: N/A  Patient denies shortness of breath, fever, cough and chest pain at PAT appointment   Patient verbalized understanding of instructions that were given to them at the PAT appointment. Patient was also instructed that they will need to review over the PAT instructions again at home before surgery.

## 2020-10-13 NOTE — Telephone Encounter (Signed)
[place] needs an [facility] - for skilled nursing facility for after her procedure on Friday - outpatient

## 2020-10-13 NOTE — Patient Instructions (Signed)
DUE TO COVID-19 ONLY ONE VISITOR IS ALLOWED TO COME WITH YOU AND STAY IN THE WAITING ROOM ONLY DURING PRE OP AND PROCEDURE.   **NO VISITORS ARE ALLOWED IN THE SHORT STAY AREA OR RECOVERY ROOM!!**  IF YOU WILL BE ADMITTED INTO THE HOSPITAL YOU ARE ALLOWED ONLY TWO SUPPORT PEOPLE DURING VISITATION HOURS ONLY (10AM -8PM)   . The support person(s) may change daily. . The support person(s) must pass our screening, gel in and out, and wear a mask at all times, including in the patient's room. . Patients must also wear a mask when staff or their support person are in the room.  No visitors under the age of 31. Any visitor under the age of 53 must be accompanied by an adult.    COVID SWAB TESTING COMPLETED ON:  Today, October 14, 2020  (Must self quarantine after testing. Follow instructions on handout.)        Your procedure is scheduled on: Friday, October 15, 2020   Report to [facility] Main  Entrance   Report to Short Stay at 5:15 AM   [facility])   Call this number if you have problems the morning of surgery [phone]   Do not eat food :After Midnight.   May have liquids until  4:30 AM  day of surgery  CLEAR LIQUID DIET  Foods Allowed                                                                     Foods Excluded  Water, Black Coffee and tea, regular and decaf                             liquids that you cannot  Plain Jell-O in any flavor  (No red)                                           see through such as: Fruit ices (not with fruit pulp)                                     milk, soups, orange juice              Iced Popsicles (No red)                                    All solid food                                   Apple juices Sports drinks like Gatorade (No red) Lightly seasoned clear broth or consume(fat free) Sugar, honey syrup  Sample Menu Breakfast                                Lunch  Supper Cranberry juice                     Beef broth                            Chicken broth Jell-O                                     Grape juice                           Apple juice Coffee or tea                        Jell-O                                      Popsicle                                                Coffee or tea                        Coffee or tea       Oral Hygiene is also important to reduce your risk of infection.                                    Remember - BRUSH YOUR TEETH THE MORNING OF SURGERY WITH YOUR REGULAR TOOTHPASTE   Do NOT smoke after Midnight   Take these medicines the morning of surgery with A SIP OF WATER: Atorvastatin, Budesonide, Citalopram  DO NOT TAKE ANY ORAL DIABETIC MEDICATIONS DAY OF YOUR SURGERY                               You may not have any metal on your body including hair pins, jewelry, and body piercings             Do not wear make-up, lotions, powders, perfumes/cologne, or deodorant             Do not wear nail polish.  Do not shave  48 hours prior to surgery.               Do not bring valuables to the hospital. [place].   Contacts, dentures or bridgework may not be worn into surgery.   Bring small overnight bag day of surgery.    Patients discharged the day of surgery will not be allowed to drive home.   Special Instructions: Bring a copy of your healthcare power of attorney and living will documents         the day of surgery if you haven't scanned them in before.              Please read over the following fact sheets you were given: IF YOU HAVE QUESTIONS ABOUT  YOUR PRE OP INSTRUCTIONS PLEASE CALL [phone]  How to Manage Your Diabetes Before and After Surgery  Why is it important to control my blood sugar before and after surgery? . Improving blood sugar levels before and after surgery helps healing and can limit problems. . A way of improving blood sugar control is eating a healthy diet  by: o  Eating less sugar and carbohydrates o  Increasing activity/exercise o  Talking with your doctor about reaching your blood sugar goals . High blood sugars (greater than 180 mg/dL) can raise your risk of infections and slow your recovery, so you will need to focus on controlling your diabetes during the weeks before surgery. . Make sure that the doctor who takes care of your diabetes knows about your planned surgery including the date and location.  How do I manage my blood sugar before surgery? . Check your blood sugar at least 4 times a day, starting 2 days before surgery, to make sure that the level is not too high or low. o Check your blood sugar the morning of your surgery when you wake up and every 2 hours until you get to the Short Stay unit. . If your blood sugar is less than 70 mg/dL, you will need to treat for low blood sugar: o Do not take insulin. o Treat a low blood sugar (less than 70 mg/dL) with  cup of clear juice (cranberry or apple), 4 glucose tablets, OR glucose gel. o Recheck blood sugar in 15 minutes after treatment (to make sure it is greater than 70 mg/dL). If your blood sugar is not greater than 70 mg/dL on recheck, call [phone] for further instructions. . Report your blood sugar to the short stay nurse when you get to Short Stay.  . If you are admitted to the hospital after surgery: o Your blood sugar will be checked by the staff and you will probably be given insulin after surgery (instead of oral diabetes medicines) to make sure you have good blood sugar levels. o The goal for blood sugar control after surgery is 80-180 mg/dL.   WHAT DO I DO ABOUT MY DIABETES MEDICATION?  Marland Kitchen Do not take oral diabetes medicines (pills) the morning of surgery.   Reviewed and Endorsed by [facility] Patient Education Committee, August 2015  [facility]- Preparing for Total Shoulder Arthroplasty    Before surgery, you can play an important role. Because skin is not  sterile, your skin needs to be as free of germs as possible. You can reduce the number of germs on your skin by using the following products. . Benzoyl Peroxide Gel o Reduces the number of germs present on the skin   ==================================================================  Please follow these instructions carefully:  BENZOYL PEROXIDE 5% GEL  Please do not use if you have an allergy to benzoyl peroxide.   If your skin becomes reddened/irritated stop using the benzoyl peroxide.  Starting TODAY before surgery, apply as follows: 1. Apply benzoyl peroxide when you get home from appointment and tonight. Apply after taking a shower. If you are not taking a shower clean entire shoulder front, back, and side along with the armpit with a clean wet washcloth.  2. Place a quarter-sized dollop on your shoulder and rub in thoroughly, making sure to cover the front, back, and side of your shoulder, along with the armpit.  3. Last application is the night before surgery, AFTER using the CHG soap as described below.  4. Do NOT apply benzoyl peroxide gel on the day of surgery.  [facility] - Preparing for Surgery Before surgery, you can play an important role.  Because skin is not sterile, your skin needs to be as free of germs as possible.  You can reduce the number of germs on your skin by washing with CHG (chlorahexidine gluconate) soap before surgery.  CHG is an antiseptic cleaner which kills germs and bonds with the skin to continue killing germs even after washing. Please DO NOT use if you have an allergy to CHG or antibacterial soaps.  If your skin becomes reddened/irritated stop using the CHG and inform your nurse when you arrive at Short Stay. Do not shave (including legs and underarms) for at least 48 hours prior to the first CHG shower.  You may shave your face/neck.  Please follow these instructions carefully:  1.  Shower with CHG Soap the night before surgery  and the  morning of surgery.  2.  If you choose to wash your hair, wash your hair first as usual with your normal  shampoo.  3.  After you shampoo, rinse your hair and body thoroughly to remove the shampoo.                             4.  Use CHG as you would any other liquid soap.  You can apply chg directly to the skin and wash.  Gently with a scrungie or clean washcloth.  5.  Apply the CHG Soap to your body ONLY FROM THE NECK DOWN.   Do   not use on face/ open                           Wound or open sores. Avoid contact with eyes, ears mouth and   genitals (private parts).                       Wash face,  Genitals (private parts) with your normal soap.             6.  Wash thoroughly, paying special attention to the area where your    surgery  will be performed.  7.  Thoroughly rinse your body with warm water from the neck down.  8.  DO NOT shower/wash with your normal soap after using and rinsing off the CHG Soap.                9.  Pat yourself dry with a clean towel.            10.  Wear clean pajamas.            11.  Place clean sheets on your bed the night of your first shower and do not  sleep with pets. Day of Surgery : Do not apply any lotions/deodorants the morning of surgery.  Please wear clean clothes to the hospital/surgery center.  FAILURE TO FOLLOW THESE INSTRUCTIONS MAY RESULT IN THE CANCELLATION OF YOUR SURGERY  PATIENT SIGNATURE_________________________________  NURSE SIGNATURE__________________________________  ________________________________________________________________________   [name]  An incentive spirometer is a tool that can help keep your lungs clear and active. This tool measures how well you are filling your lungs with each breath. Taking long deep breaths may help reverse or decrease  the chance of developing breathing (pulmonary) problems (especially infection) following:  A long period of time when you are unable to move or be  active. BEFORE THE PROCEDURE   If the spirometer includes an indicator to show your best effort, your nurse or respiratory therapist will set it to a desired goal.  If possible, sit up straight or lean slightly forward. Try not to slouch.  Hold the incentive spirometer in an upright position. INSTRUCTIONS FOR USE  1. Sit on the edge of your bed if possible, or sit up as far as you can in bed or on a chair. 2. Hold the incentive spirometer in an upright position. 3. Breathe out normally. 4. Place the mouthpiece in your mouth and seal your lips tightly around it. 5. Breathe in slowly and as deeply as possible, raising the piston or the ball toward the top of the column. 6. Hold your breath for 3-5 seconds or for as long as possible. Allow the piston or ball to fall to the bottom of the column. 7. Remove the mouthpiece from your mouth and breathe out normally. 8. Rest for a few seconds and repeat Steps 1 through 7 at least 10 times every 1-2 hours when you are awake. Take your time and take a few normal breaths between deep breaths. 9. The spirometer may include an indicator to show your best effort. Use the indicator as a goal to work toward during each repetition. 10. After each set of 10 deep breaths, practice coughing to be sure your lungs are clear. If you have an incision (the cut made at the time of surgery), support your incision when coughing by placing a pillow or rolled up towels firmly against it. Once you are able to get out of bed, walk around indoors and cough well. You may stop using the incentive spirometer when instructed by your caregiver.  RISKS AND COMPLICATIONS  Take your time so you do not get dizzy or light-headed.  If you are in pain, you may need to take or ask for pain medication before doing incentive spirometry. It is harder to take a deep breath if you are having pain. AFTER USE  Rest and breathe slowly and easily.  It can be helpful to keep track of a log of  your progress. Your caregiver can provide you with a simple table to help with this. If you are using the spirometer at home, follow these instructions: [place] IF:   You are having difficultly using the spirometer.  You have trouble using the spirometer as often as instructed.  Your pain medication is not giving enough relief while using the spirometer.  You develop fever of 100.5 F (38.1 C) or higher. SEEK IMMEDIATE MEDICAL CARE IF:   You cough up bloody sputum that had not been present before.  You develop fever of 102 F (38.9 C) or greater.  You develop worsening pain at or near the incision site. MAKE SURE YOU:   Understand these instructions.  Will watch your condition.  Will get help right away if you are not doing well or get worse. Document Released: 10/16/2006 Document Revised: 08/28/2011 Document Reviewed: 12/17/2006 [facility] Patient Information 2014 [place], [place].   ________________________________________________________________________

## 2020-10-14 ENCOUNTER — Other Ambulatory Visit: Payer: Self-pay

## 2020-10-14 ENCOUNTER — Encounter (HOSPITAL_COMMUNITY)
Admission: RE | Admit: 2020-10-14 | Discharge: 2020-10-14 | Disposition: A | Discharge status: Home / Self Care | Payer: Medicare Other | Source: Ambulatory Visit | Attending: Orthopedic Surgery | Admitting: Orthopedic Surgery

## 2020-10-14 ENCOUNTER — Encounter (HOSPITAL_COMMUNITY): Payer: Self-pay | Admitting: Orthopedic Surgery

## 2020-10-14 ENCOUNTER — Other Ambulatory Visit (HOSPITAL_COMMUNITY)
Admission: RE | Admit: 2020-10-14 | Discharge: 2020-10-14 | Disposition: A | Discharge status: Home / Self Care | Payer: Medicare Other | Source: Ambulatory Visit | Attending: Orthopedic Surgery | Admitting: Orthopedic Surgery

## 2020-10-14 DIAGNOSIS — Z20822 Contact with and (suspected) exposure to covid-19: Secondary | ICD-10-CM | POA: Diagnosis not present

## 2020-10-14 DIAGNOSIS — Z01818 Encounter for other preprocedural examination: Secondary | ICD-10-CM | POA: Diagnosis not present

## 2020-10-14 LAB — SURGICAL PCR SCREEN
MRSA, PCR: NEGATIVE
Staphylococcus aureus: NEGATIVE

## 2020-10-14 LAB — CBC
HCT: 39.7 % (ref 36.0–46.0)
Hemoglobin: 12.9 g/dL (ref 12.0–15.0)
MCH: 29.9 pg (ref 26.0–34.0)
MCHC: 32.5 g/dL (ref 30.0–36.0)
MCV: 91.9 fL (ref 80.0–100.0)
Platelets: 309 10*3/uL (ref 150–400)
RBC: 4.32 MIL/uL (ref 3.87–5.11)
RDW: 13.2 % (ref 11.5–15.5)
WBC: 7.9 10*3/uL (ref 4.0–10.5)
nRBC: 0 % (ref 0.0–0.2)

## 2020-10-14 LAB — BASIC METABOLIC PANEL
Anion gap: 8 (ref 5–15)
BUN: 25 mg/dL — ABNORMAL HIGH (ref 8–23)
CO2: 23 mmol/L (ref 22–32)
Calcium: 9.8 mg/dL (ref 8.9–10.3)
Chloride: 107 mmol/L (ref 98–111)
Creatinine, Ser: 0.89 mg/dL (ref 0.44–1.00)
GFR, Estimated: >60 mL/min (ref >60)
Glucose, Bld: 128 mg/dL — ABNORMAL HIGH (ref 70–99)
Potassium: 3.9 mmol/L (ref 3.5–5.1)
Sodium: 138 mmol/L (ref 135–145)

## 2020-10-14 LAB — HEMOGLOBIN A1C
Hgb A1c MFr Bld: 6.3 % — ABNORMAL HIGH (ref 4.8–5.6)
Mean Plasma Glucose: 134.11 mg/dL

## 2020-10-14 LAB — GLUCOSE, CAPILLARY: Glucose-Capillary: 132 mg/dL — ABNORMAL HIGH (ref 70–99)

## 2020-10-14 LAB — SARS CORONAVIRUS 2 (TAT 6-24 HRS): SARS Coronavirus 2: NEGATIVE

## 2020-10-14 NOTE — Telephone Encounter (Signed)
[facility] and was transferred to a vm. Left a message for them to fax over FL2 form to be filled out.

## 2020-10-14 NOTE — Telephone Encounter (Signed)
[facility] needs to fax [place] an FL2 so we can complete this for them.

## 2020-10-15 ENCOUNTER — Ambulatory Visit (HOSPITAL_COMMUNITY): Payer: Medicare Other

## 2020-10-15 ENCOUNTER — Encounter (HOSPITAL_COMMUNITY): Payer: Self-pay | Admitting: Orthopedic Surgery

## 2020-10-15 ENCOUNTER — Ambulatory Visit (HOSPITAL_COMMUNITY)
Admission: RE | Admit: 2020-10-15 | Discharge: 2020-10-15 | Disposition: A | Discharge status: Home / Self Care | Payer: Medicare Other | Attending: Orthopedic Surgery | Admitting: Orthopedic Surgery

## 2020-10-15 ENCOUNTER — Encounter (HOSPITAL_COMMUNITY)
Admission: RE | Disposition: A | Discharge status: Home / Self Care | Payer: Self-pay | Source: Home / Self Care | Attending: Orthopedic Surgery

## 2020-10-15 ENCOUNTER — Ambulatory Visit (HOSPITAL_COMMUNITY): Payer: Medicare Other | Admitting: Certified Registered Nurse Anesthetist

## 2020-10-15 DIAGNOSIS — Z791 Long term (current) use of non-steroidal anti-inflammatories (NSAID): Secondary | ICD-10-CM | POA: Insufficient documentation

## 2020-10-15 DIAGNOSIS — Z96612 Presence of left artificial shoulder joint: Secondary | ICD-10-CM | POA: Diagnosis not present

## 2020-10-15 DIAGNOSIS — Z888 Allergy status to other drugs, medicaments and biological substances status: Secondary | ICD-10-CM | POA: Diagnosis not present

## 2020-10-15 DIAGNOSIS — Z91018 Allergy to other foods: Secondary | ICD-10-CM | POA: Insufficient documentation

## 2020-10-15 DIAGNOSIS — Z7984 Long term (current) use of oral hypoglycemic drugs: Secondary | ICD-10-CM | POA: Diagnosis not present

## 2020-10-15 DIAGNOSIS — J45909 Unspecified asthma, uncomplicated: Secondary | ICD-10-CM | POA: Diagnosis not present

## 2020-10-15 DIAGNOSIS — E785 Hyperlipidemia, unspecified: Secondary | ICD-10-CM | POA: Diagnosis not present

## 2020-10-15 DIAGNOSIS — K219 Gastro-esophageal reflux disease without esophagitis: Secondary | ICD-10-CM | POA: Insufficient documentation

## 2020-10-15 DIAGNOSIS — M6389 Disorders of muscle in diseases classified elsewhere, multiple sites: Secondary | ICD-10-CM | POA: Insufficient documentation

## 2020-10-15 DIAGNOSIS — G8918 Other acute postprocedural pain: Secondary | ICD-10-CM | POA: Diagnosis not present

## 2020-10-15 DIAGNOSIS — Z7952 Long term (current) use of systemic steroids: Secondary | ICD-10-CM | POA: Diagnosis not present

## 2020-10-15 DIAGNOSIS — M62838 Other muscle spasm: Secondary | ICD-10-CM | POA: Insufficient documentation

## 2020-10-15 DIAGNOSIS — E119 Type 2 diabetes mellitus without complications: Secondary | ICD-10-CM | POA: Diagnosis not present

## 2020-10-15 DIAGNOSIS — M12812 Other specific arthropathies, not elsewhere classified, left shoulder: Secondary | ICD-10-CM | POA: Diagnosis not present

## 2020-10-15 DIAGNOSIS — M19012 Primary osteoarthritis, left shoulder: Secondary | ICD-10-CM | POA: Diagnosis not present

## 2020-10-15 DIAGNOSIS — Z471 Aftercare following joint replacement surgery: Secondary | ICD-10-CM | POA: Diagnosis not present

## 2020-10-15 DIAGNOSIS — Z87891 Personal history of nicotine dependence: Secondary | ICD-10-CM | POA: Insufficient documentation

## 2020-10-15 DIAGNOSIS — Z96642 Presence of left artificial hip joint: Secondary | ICD-10-CM | POA: Diagnosis not present

## 2020-10-15 DIAGNOSIS — Z79899 Other long term (current) drug therapy: Secondary | ICD-10-CM | POA: Diagnosis not present

## 2020-10-15 DIAGNOSIS — Z0279 Encounter for issue of other medical certificate: Secondary | ICD-10-CM

## 2020-10-15 DIAGNOSIS — M75102 Unspecified rotator cuff tear or rupture of left shoulder, not specified as traumatic: Secondary | ICD-10-CM | POA: Insufficient documentation

## 2020-10-15 DIAGNOSIS — J4540 Moderate persistent asthma, uncomplicated: Secondary | ICD-10-CM | POA: Diagnosis not present

## 2020-10-15 HISTORY — PX: REVERSE SHOULDER ARTHROPLASTY: SHX5054

## 2020-10-15 HISTORY — DX: Gastro-esophageal reflux disease without esophagitis: K21.9

## 2020-10-15 LAB — GLUCOSE, CAPILLARY
Glucose-Capillary: 118 mg/dL — ABNORMAL HIGH (ref 70–99)
Glucose-Capillary: 188 mg/dL — ABNORMAL HIGH (ref 70–99)

## 2020-10-15 SURGERY — ARTHROPLASTY, SHOULDER, TOTAL, REVERSE
Anesthesia: Regional | Site: Shoulder | Laterality: Left

## 2020-10-15 MED ORDER — BUPIVACAINE-EPINEPHRINE (PF) 0.25% -1:200000 IJ SOLN
INTRAMUSCULAR | Status: AC
  Filled 2020-10-15: qty 30

## 2020-10-15 MED ORDER — SUCCINYLCHOLINE CHLORIDE 200 MG/10ML IV SOSY
PREFILLED_SYRINGE | INTRAVENOUS | Status: AC
  Filled 2020-10-15: qty 10

## 2020-10-15 MED ORDER — METHOCARBAMOL 500 MG PO TABS: 500.0000 mg | ORAL_TABLET | Freq: Three times a day (TID) | ORAL | 1 refills | Status: DC | PRN

## 2020-10-15 MED ORDER — LIDOCAINE 2% (20 MG/ML) 5 ML SYRINGE
INTRAMUSCULAR | Status: AC
  Filled 2020-10-15: qty 10

## 2020-10-15 MED ORDER — ONDANSETRON HCL 4 MG/2ML IJ SOLN
INTRAMUSCULAR | Status: AC
  Filled 2020-10-15: qty 2

## 2020-10-15 MED ORDER — BUPIVACAINE-EPINEPHRINE (PF) 0.25% -1:200000 IJ SOLN
INTRAMUSCULAR | Status: DC | PRN
  Administered 2020-10-15: 14 mL

## 2020-10-15 MED ORDER — BUPIVACAINE LIPOSOME 1.3 % IJ SUSP
INTRAMUSCULAR | Status: DC | PRN
  Administered 2020-10-15: 133 mg via PERINEURAL

## 2020-10-15 MED ORDER — FENTANYL CITRATE (PF) 100 MCG/2ML IJ SOLN
INTRAMUSCULAR | Status: AC
  Filled 2020-10-15: qty 2

## 2020-10-15 MED ORDER — PROPOFOL 10 MG/ML IV BOLUS
INTRAVENOUS | Status: DC | PRN
  Administered 2020-10-15: 130 mg via INTRAVENOUS

## 2020-10-15 MED ORDER — CEFAZOLIN SODIUM-DEXTROSE 2-4 GM/100ML-% IV SOLN: 2.0000 g | Freq: Four times a day (QID) | INTRAVENOUS | Status: DC

## 2020-10-15 MED ORDER — METHOCARBAMOL 500 MG IVPB - SIMPLE MED: 500.0000 mg | Freq: Four times a day (QID) | INTRAVENOUS | Status: DC | PRN

## 2020-10-15 MED ORDER — DEXAMETHASONE SODIUM PHOSPHATE 10 MG/ML IJ SOLN
INTRAMUSCULAR | Status: DC | PRN
  Administered 2020-10-15: 6 mg via INTRAVENOUS

## 2020-10-15 MED ORDER — OXYCODONE HCL 5 MG/5ML PO SOLN: 5.0000 mg | Freq: Once | ORAL | Status: DC | PRN

## 2020-10-15 MED ORDER — LIDOCAINE 2% (20 MG/ML) 5 ML SYRINGE
INTRAMUSCULAR | Status: DC | PRN
  Administered 2020-10-15: 80 mg via INTRAVENOUS

## 2020-10-15 MED ORDER — ACETAMINOPHEN 10 MG/ML IV SOLN: 1000.0000 mg | Freq: Once | INTRAVENOUS | Status: DC | PRN

## 2020-10-15 MED ORDER — PROPOFOL 10 MG/ML IV BOLUS
INTRAVENOUS | Status: AC
  Filled 2020-10-15: qty 40

## 2020-10-15 MED ORDER — METHOCARBAMOL 500 MG PO TABS: 500.0000 mg | ORAL_TABLET | Freq: Four times a day (QID) | ORAL | Status: DC | PRN

## 2020-10-15 MED ORDER — ONDANSETRON HCL 4 MG/2ML IJ SOLN
INTRAMUSCULAR | Status: DC | PRN
  Administered 2020-10-15: 4 mg via INTRAVENOUS

## 2020-10-15 MED ORDER — HYDROCODONE-ACETAMINOPHEN 5-325 MG PO TABS: 1.0000 | ORAL_TABLET | Freq: Four times a day (QID) | ORAL | 0 refills | Status: DC | PRN

## 2020-10-15 MED ORDER — CEFAZOLIN SODIUM-DEXTROSE 2-4 GM/100ML-% IV SOLN
2.0000 g | INTRAVENOUS | Status: AC
  Administered 2020-10-15: 2 g via INTRAVENOUS
  Filled 2020-10-15: qty 100

## 2020-10-15 MED ORDER — OXYCODONE HCL 5 MG PO TABS: 5.0000 mg | ORAL_TABLET | Freq: Once | ORAL | Status: DC | PRN

## 2020-10-15 MED ORDER — ACETAMINOPHEN 160 MG/5ML PO SOLN: 1000.0000 mg | Freq: Once | ORAL | Status: DC | PRN

## 2020-10-15 MED ORDER — ORAL CARE MOUTH RINSE
15.0000 mL | Freq: Once | OROMUCOSAL | Status: AC
Start: 2020-10-15 — End: 2020-10-15

## 2020-10-15 MED ORDER — SUCCINYLCHOLINE CHLORIDE 200 MG/10ML IV SOSY
PREFILLED_SYRINGE | INTRAVENOUS | Status: DC | PRN
  Administered 2020-10-15: 140 mg via INTRAVENOUS

## 2020-10-15 MED ORDER — BUPIVACAINE-EPINEPHRINE (PF) 0.5% -1:200000 IJ SOLN
INTRAMUSCULAR | Status: DC | PRN
  Administered 2020-10-15: 15 mL via PERINEURAL

## 2020-10-15 MED ORDER — PHENYLEPHRINE HCL-NACL 10-0.9 MG/250ML-% IV SOLN
INTRAVENOUS | Status: DC | PRN
  Administered 2020-10-15: 40 ug/min via INTRAVENOUS

## 2020-10-15 MED ORDER — DEXAMETHASONE SODIUM PHOSPHATE 10 MG/ML IJ SOLN
INTRAMUSCULAR | Status: AC
  Filled 2020-10-15: qty 1

## 2020-10-15 MED ORDER — PHENYLEPHRINE HCL-NACL 10-0.9 MG/250ML-% IV SOLN
INTRAVENOUS | Status: AC
  Filled 2020-10-15: qty 1000

## 2020-10-15 MED ORDER — LACTATED RINGERS IV SOLN: INTRAVENOUS | Status: DC

## 2020-10-15 MED ORDER — SODIUM CHLORIDE 0.9 % IR SOLN
Status: DC | PRN
  Administered 2020-10-15: 1000 mL

## 2020-10-15 MED ORDER — ACETAMINOPHEN 500 MG PO TABS: 1000.0000 mg | ORAL_TABLET | Freq: Once | ORAL | Status: DC | PRN

## 2020-10-15 MED ORDER — FENTANYL CITRATE (PF) 100 MCG/2ML IJ SOLN: 25.0000 ug | INTRAMUSCULAR | Status: DC | PRN

## 2020-10-15 MED ORDER — CHLORHEXIDINE GLUCONATE 0.12 % MT SOLN
15.0000 mL | Freq: Once | OROMUCOSAL | Status: AC
  Administered 2020-10-15: 15 mL via OROMUCOSAL

## 2020-10-15 MED ORDER — FENTANYL CITRATE (PF) 100 MCG/2ML IJ SOLN
INTRAMUSCULAR | Status: DC | PRN
  Administered 2020-10-15 (×2): 50 ug via INTRAVENOUS

## 2020-10-15 SURGICAL SUPPLY — 66 items
BAG ZIPLOCK 12X15 (MISCELLANEOUS) ×2 IMPLANT
BIT DRILL 1.6MX128 (BIT) IMPLANT
BIT DRILL 170X2.5X (BIT) ×1 IMPLANT
BIT DRL 170X2.5X (BIT) ×1
BLADE SAG 18X100X1.27 (BLADE) ×2 IMPLANT
COVER BACK TABLE 60X90IN (DRAPES) ×2 IMPLANT
COVER SURGICAL LIGHT HANDLE (MISCELLANEOUS) ×2 IMPLANT
COVER WAND RF STERILE (DRAPES) IMPLANT
DECANTER SPIKE VIAL GLASS SM (MISCELLANEOUS) IMPLANT
DRAPE INCISE IOBAN 66X45 STRL (DRAPES) ×2 IMPLANT
DRAPE ORTHO SPLIT 77X108 STRL (DRAPES) ×2
DRAPE SHEET LG 3/4 BI-LAMINATE (DRAPES) ×2 IMPLANT
DRAPE SURG ORHT 6 SPLT 77X108 (DRAPES) ×2 IMPLANT
DRAPE TOP 10253 STERILE (DRAPES) ×2 IMPLANT
DRAPE U-SHAPE 47X51 STRL (DRAPES) ×2 IMPLANT
DRILL 2.5 (BIT) ×1
DRSG ADAPTIC 3X8 NADH LF (GAUZE/BANDAGES/DRESSINGS) ×2 IMPLANT
DRSG PAD ABDOMINAL 8X10 ST (GAUZE/BANDAGES/DRESSINGS) ×2 IMPLANT
DURAPREP 26ML APPLICATOR (WOUND CARE) ×2 IMPLANT
ELECT BLADE TIP CTD 4 INCH (ELECTRODE) ×2 IMPLANT
ELECT NEEDLE TIP 2.8 STRL (NEEDLE) ×2 IMPLANT
ELECT REM PT RETURN 15FT ADLT (MISCELLANEOUS) ×2 IMPLANT
EPI LT SZ 1 (Orthopedic Implant) ×2 IMPLANT
EPIPHYSIS LT SZ 1 (Orthopedic Implant) ×1 IMPLANT
FACESHIELD WRAPAROUND (MASK) ×2 IMPLANT
GAUZE SPONGE 4X4 12PLY STRL (GAUZE/BANDAGES/DRESSINGS) ×2 IMPLANT
GLENOSPHERE DELTA XTEND LAT 38 (Miscellaneous) ×2 IMPLANT
GLOVE BIOGEL PI ORTHO PRO 7.5 (GLOVE) ×1
GLOVE PI ORTHO PRO STRL 7.5 (GLOVE) ×1 IMPLANT
GLOVE SURG ORTHO LTX SZ7.5 (GLOVE) ×2 IMPLANT
GLOVE SURG ORTHO LTX SZ8.5 (GLOVE) ×2 IMPLANT
GLOVE SURG POLY ORTHO LF SZ8 (GLOVE) ×2 IMPLANT
GOWN STRL REUS W/TWL XL LVL3 (GOWN DISPOSABLE) ×4 IMPLANT
KIT BASIN OR (CUSTOM PROCEDURE TRAY) ×2 IMPLANT
KIT TURNOVER KIT A (KITS) ×2 IMPLANT
MANIFOLD NEPTUNE II (INSTRUMENTS) ×2 IMPLANT
METAGLENE DELTA EXTEND (Trauma) ×1 IMPLANT
METAGLENE DXTEND (Trauma) ×2 IMPLANT
NEEDLE MAYO CATGUT SZ4 (NEEDLE) IMPLANT
NS IRRIG 1000ML POUR BTL (IV SOLUTION) ×2 IMPLANT
PACK SHOULDER (CUSTOM PROCEDURE TRAY) ×2 IMPLANT
PENCIL SMOKE EVACUATOR (MISCELLANEOUS) IMPLANT
PIN GUIDE 1.2 (PIN) ×2 IMPLANT
PIN GUIDE GLENOPHERE 1.5MX300M (PIN) ×2 IMPLANT
PIN METAGLENE 2.5 (PIN) ×2 IMPLANT
PROTECTOR NERVE ULNAR (MISCELLANEOUS) IMPLANT
RESTRAINT HEAD UNIVERSAL NS (MISCELLANEOUS) ×2 IMPLANT
SCREW 4.5X36MM (Screw) ×2 IMPLANT
SCREW LOCK DELTA XTEND 4.5X30 (Screw) ×2 IMPLANT
SLING ARM FOAM STRAP LRG (SOFTGOODS) ×2 IMPLANT
SMARTMIX MINI TOWER (MISCELLANEOUS)
SPACER 38 PLUS 3 (Spacer) ×2 IMPLANT
SPONGE LAP 4X18 RFD (DISPOSABLE) IMPLANT
STEM DELTA DIA 10 HA (Stem) ×2 IMPLANT
STRIP CLOSURE SKIN 1/2X4 (GAUZE/BANDAGES/DRESSINGS) ×2 IMPLANT
SUCTION FRAZIER HANDLE 10FR (MISCELLANEOUS) ×1
SUCTION TUBE FRAZIER 10FR DISP (MISCELLANEOUS) ×1 IMPLANT
SUT FIBERWIRE #2 38 T-5 BLUE (SUTURE) ×2
SUT MNCRL AB 4-0 PS2 18 (SUTURE) ×2 IMPLANT
SUT VIC AB 0 CT1 36 (SUTURE) ×4 IMPLANT
SUT VIC AB 0 CT2 27 (SUTURE) ×2 IMPLANT
SUT VIC AB 2-0 CT1 27 (SUTURE) ×1
SUT VIC AB 2-0 CT1 TAPERPNT 27 (SUTURE) ×1 IMPLANT
SUTURE FIBERWR #2 38 T-5 BLUE (SUTURE) ×1 IMPLANT
TOWEL OR 17X26 10 PK STRL BLUE (TOWEL DISPOSABLE) ×2 IMPLANT
TOWER SMARTMIX MINI (MISCELLANEOUS) IMPLANT

## 2020-10-15 NOTE — Transfer of Care (Signed)
Immediate Anesthesia Transfer of Care Note  Patient: [name]  Procedure(s) Performed: Procedure(s): REVERSE SHOULDER ARTHROPLASTY (Left)  Patient Location: PACU  Anesthesia Type:General  Level of Consciousness:  sedated, patient cooperative and responds to stimulation  Airway & Oxygen Therapy:Patient Spontanous Breathing and Patient connected to face mask oxgen  Post-op Assessment:  Report given to PACU RN and Post -op Vital signs reviewed and stable  Post vital signs:  Reviewed and stable  Last Vitals:  Vitals:   10/15/20 0551  BP: (!) 147/67  Pulse: 71  Resp: 16  Temp: 36.4 C  SpO2: 95%    Complications: No apparent anesthesia complications

## 2020-10-15 NOTE — Telephone Encounter (Signed)
I have not received a fax from [facility] for patient.

## 2020-10-15 NOTE — Interval H&P Note (Signed)
History and Physical Interval Note:  10/15/2020 7:34 AM  [name]  has presented today for surgery, with the diagnosis of multi-tendon rotator cuff tear arthropathy and left shoulder severe pain.  The various methods of treatment have been discussed with the patient and family. After consideration of risks, benefits and other options for treatment, the patient has consented to  Procedure(s): REVERSE SHOULDER ARTHROPLASTY (Left) as a surgical intervention.  The patient's history has been reviewed, patient examined, no change in status, stable for surgery.  I have reviewed the patient's chart and labs.  Questions were answered to the patient's satisfaction.     [name]

## 2020-10-15 NOTE — Discharge Instructions (Signed)
Ice to the shoulder constantly.  Keep the incision covered and clean and dry for one week, then ok to get it wet in the shower.  Do gentle hand and wrist exercise as instructed several times per day.  DO NOT reach behind your back or push up out of a chair with the operative arm.  Use a sling while you are up and around for comfort, may remove while seated.  Keep pillow propped behind the operative elbow.  Follow up with Dr Veverly Fells in two weeks in the office, call [phone] for appt

## 2020-10-15 NOTE — Anesthesia Preprocedure Evaluation (Signed)
Anesthesia Evaluation  Patient identified by MRN, date of birth, ID band Patient awake    Reviewed: Allergy & Precautions, NPO status , Patient's Chart, lab work & pertinent test results  History of Anesthesia Complications Negative for: history of anesthetic complications  Airway Mallampati: III  TM Distance: >3 FB Neck ROM: Full    Dental  (+) Dental Advisory Given, Teeth Intact   Pulmonary neg shortness of breath, neg sleep apnea, neg COPD, neg recent URI, former smoker,  Covid-19 Nucleic Acid Test Results Lab Results      Component                Value               Date                      [place]              NEGATIVE            10/14/2020                [place]              Not Detected        07/09/2019              breath sounds clear to auscultation       Cardiovascular negative cardio ROS   Rhythm:Regular  Left ventricle: The cavity size was normal. Wall thickness was  increased in a pattern of mild LVH. Systolic function was normal.  The estimated ejection fraction was in the range of 55% to 60%.  Wall motion was normal; there were no regional wall motion  abnormalities. Doppler parameters are consistent with abnormal  left ventricular relaxation (grade 1 diastolic dysfunction).  - Aortic valve: There was no stenosis.  - Mitral valve: Trivial regurgitation.  - Left atrium: The atrium was mildly dilated.  - Right ventricle: The cavity size was normal. Systolic function was  normal.  - Tricuspid valve: Peak RV-RA gradient: 60mm Hg (S).  - Pulmonary arteries: PA peak pressure: 95mm Hg (S).  - Inferior vena cava: The vessel was normal in size; the  respirophasic diameter changes were in the normal range (= 50%);  findings are consistent with normal central venous pressure.  - Pericardium, extracardiac: A trivial pericardial effusion was  identified posterior to the heart.      Neuro/Psych  Headaches, PSYCHIATRIC DISORDERS Anxiety Depression    GI/Hepatic Neg liver ROS, GERD  Medicated,  Endo/Other  diabetes, Type 2  Renal/GU Renal diseaseLab Results      Component                Value               Date                      CREATININE               0.89                10/14/2020           Lab Results      Component                Value               Date  K                        3.9                 10/14/2020                Musculoskeletal multi-tendon rotator cuff tear arthropathy and left shoulder severe pain   Abdominal   Peds  Hematology negative hematology ROS (+) Lab Results      Component                Value               Date                      WBC                      7.9                 10/14/2020                HGB                      12.9                10/14/2020                HCT                      39.7                10/14/2020                MCV                      91.9                10/14/2020                PLT                      309                 10/14/2020              Anesthesia Other Findings   Reproductive/Obstetrics                             Anesthesia Physical Anesthesia Plan  ASA: II  Anesthesia Plan: General and Regional   Post-op Pain Management:  Regional for Post-op pain   Induction: Intravenous  PONV Risk Score and Plan: 3 and Ondansetron and Dexamethasone  Airway Management Planned: Oral ETT  Additional Equipment: None  Intra-op Plan:   Post-operative Plan: Extubation in OR  Informed Consent: I have reviewed the patients History and Physical, chart, labs and discussed the procedure including the risks, benefits and alternatives for the proposed anesthesia with the patient or authorized representative who has indicated his/her understanding and acceptance.     Dental advisory given  Plan Discussed with: CRNA and Surgeon  Anesthesia  Plan Comments:         Anesthesia Quick Evaluation

## 2020-10-15 NOTE — Anesthesia Procedure Notes (Signed)
Procedure Name: Intubation Date/Time: 10/15/2020 8:02 AM Performed by: Lavina Hamman, CRNA Pre-anesthesia Checklist: Patient identified, Emergency Drugs available, Suction available, Patient being monitored and Timeout performed Patient Re-evaluated:Patient Re-evaluated prior to induction Oxygen Delivery Method: Circle system utilized Preoxygenation: Pre-oxygenation with 100% oxygen Induction Type: IV induction Ventilation: Mask ventilation without difficulty Laryngoscope Size: 4 and Glidescope Grade View: Grade I Tube type: Oral Tube size: 7.0 mm Number of attempts: 1 Airway Equipment and Method: Stylet and Video-laryngoscopy Placement Confirmation: ETT inserted through vocal cords under direct vision,  positive ETCO2,  CO2 detector and breath sounds checked- equal and bilateral Secured at: 21 cm Tube secured with: Tape Dental Injury: Teeth and Oropharynx as per pre-operative assessment  Difficulty Due To: Difficulty was anticipated, Difficult Airway- due to large tongue, Difficult Airway- due to dentition and Difficult Airway- due to limited oral opening Future Recommendations: Recommend- induction with short-acting agent, and alternative techniques readily available Comments: ATOI, easy with Glidescope.

## 2020-10-15 NOTE — Anesthesia Procedure Notes (Signed)
Anesthesia Regional Block: Interscalene brachial plexus block   Pre-Anesthetic Checklist: ,, timeout performed, Correct Patient, Correct Site, Correct Laterality, Correct Procedure, Correct Position, site marked, Risks and benefits discussed,  Surgical consent,  Pre-op evaluation,  At surgeon's request and post-op pain management  Laterality: Left and Upper  Prep: chloraprep       Needles:  Injection technique: Single-shot     Needle Length: 5cm  Needle Gauge: 22     Additional Needles: Arrow StimuQuik ECHO Echogenic Stimulating PNB Needle  Procedures:,,,, ultrasound used (permanent image in chart),,,,  Narrative:  Start time: 10/15/2020 7:11 AM End time: 10/15/2020 7:16 AM Injection made incrementally with aspirations every 5 mL.  Performed by: Personally  Anesthesiologist: Oleta Mouse, MD

## 2020-10-15 NOTE — Telephone Encounter (Signed)
Called number provided and and had to leave a vm message.

## 2020-10-15 NOTE — Telephone Encounter (Signed)
Luellen Pucker at [facility] has called in regard to [facility].   States patient will be arriving at any moment.  Needs FL2 faxed to [phone]  Please give Luellen Pucker a call in regard at [phone]

## 2020-10-15 NOTE — Telephone Encounter (Signed)
I have given the FL2 to Dr. Birdie Riddle, she has completed it and I have faxed it to [phone]   Made a copy sent to scan and charge entry

## 2020-10-15 NOTE — Brief Op Note (Signed)
10/15/2020  9:19 AM  PATIENT:  [name]  80 y.o. female  PRE-OPERATIVE DIAGNOSIS:  multi-tendon rotator cuff tear arthropathy and left shoulder severe pain  POST-OPERATIVE DIAGNOSIS:  multi-tendon rotator cuff tear arthropathy and left shoulder severe pain  PROCEDURE:  Procedure(s): REVERSE SHOULDER ARTHROPLASTY (Left) DePuy Delta Xtend with no subscap repair  SURGEON:  Surgeon(s) and Role:    Netta Cedars, MD - Primary  PHYSICIAN ASSISTANT:   ASSISTANTS: [place]ura Bruns, PA-C   ANESTHESIA:   regional and general  EBL:  100 mL   BLOOD ADMINISTERED:none  DRAINS: none   LOCAL MEDICATIONS USED:  MARCAINE     SPECIMEN:  No Specimen  DISPOSITION OF SPECIMEN:  N/A  COUNTS:  YES  TOURNIQUET:  * No tourniquets in log *  DICTATION: .Other Dictation: Dictation Number [id number]  PLAN OF CARE: Discharge to home after PACU  PATIENT DISPOSITION:  PACU - hemodynamically stable.   Delay start of Pharmacological VTE agent (>24hrs) due to surgical blood loss or risk of bleeding: not applicable

## 2020-10-15 NOTE — Evaluation (Signed)
Occupational Therapy Evaluation Patient Details Name: [name] MRN: [id number] DOB: [birth year] Today's Date: 10/15/2020    History of Present Illness Patient is s/p left reverse TSA   Clinical Impression   Mrs.[name] is an 80 year old woman s/p shoulder replacement without functional use of left non-dominant upper extremity secondary to effects of surgery, interscalene block and shoulder precautions. Therapist provided education and instruction to patient in regards to exercises, precautions, positioning, donning upper extremity clothing and bathing while maintaining shoulder precautions, ice and edema management and donning/doffing sling. Patient verbalized understanding and demonstrated as needed. Patient needed assistance to donn shirt, underwear, pants, socks and shoes and provided with instruction on compensatory strategies to perform ADLs. Patient to follow up with MD for further therapy needs. Patient reports she will be discharging to [facility].     Follow Up Recommendations  Follow surgeon's recommendation for DC plan and follow-up therapies    Equipment Recommendations  None recommended by OT    Recommendations for Other Services       Precautions / Restrictions Precautions Precautions: Shoulder Type of Shoulder Precautions: No active or passive ROM of left shoulder Shoulder Interventions: Shoulder sling/immobilizer;At all times;Off for dressing/bathing/exercises Precaution Booklet Issued:  (handouts) Required Braces or Orthoses: Sling Restrictions Weight Bearing Restrictions: Yes LUE Weight Bearing: Non weight bearing      Mobility Bed Mobility                    Transfers                      Balance Overall balance assessment: No apparent balance deficits (not formally assessed)                                         ADL either performed or assessed with clinical judgement   ADL Overall ADL's : Needs  assistance/impaired Eating/Feeding: Set up       Upper Body Bathing: Minimal assistance;Adhering to UE precautions   Lower Body Bathing: Modified independent   Upper Body Dressing : Maximal assistance;Adhering to UE precautions   Lower Body Dressing: Moderate assistance;Sit to/from stand   Toilet Transfer: Designer, fashion/clothing and Hygiene: Min guard               Vision Patient Visual Report: No change from baseline       Perception     Praxis      Pertinent Vitals/Pain Pain Assessment: No/denies pain (due to block)     Hand Dominance Right   Extremity/Trunk Assessment Upper Extremity Assessment Upper Extremity Assessment: RUE deficits/detail RUE Deficits / Details: decreased AROM due to effects of interscalene block RUE Sensation: decreased light touch RUE Coordination: decreased fine motor;decreased gross motor   Lower Extremity Assessment Lower Extremity Assessment: Overall WFL for tasks assessed   Cervical / Trunk Assessment Cervical / Trunk Assessment: Normal   Communication Communication Communication: No difficulties   Cognition Arousal/Alertness: Awake/alert Behavior During Therapy: WFL for tasks assessed/performed Overall Cognitive Status: Within Functional Limits for tasks assessed                                     General Comments       Exercises     Shoulder Instructions Shoulder  Instructions Donning/doffing shirt without moving shoulder: Patient able to independently direct caregiver Method for sponge bathing under operated UE: Patient able to independently direct caregiver Donning/doffing sling/immobilizer: Patient able to independently direct caregiver Correct positioning of sling/immobilizer: Patient able to independently direct caregiver ROM for elbow, wrist and digits of operated UE: Patient able to independently direct caregiver Proper positioning of operated UE when showering: Patient  able to independently direct caregiver Dressing change: Patient able to independently direct caregiver Positioning of UE while sleeping: Patient able to independently direct caregiver    Home Living Family/patient expects to be discharged to:: Skilled nursing facility                                 Additional Comments: Reports she is discharging to [facility] - a skilled nursing facility      Prior Functioning/Environment Level of Independence: Independent                 OT Problem List: Decreased strength;Decreased range of motion;Impaired UE functional use;Pain      OT Treatment/Interventions:      OT Goals(Current goals can be found in the care plan section) Acute Rehab OT Goals OT Goal Formulation: All assessment and education complete, DC therapy  OT Frequency:     Barriers to D/C:            Co-evaluation              AM-PAC OT "6 Clicks" Daily Activity     Outcome Measure Help from another person eating meals?: A Little Help from another person taking care of personal grooming?: A Little Help from another person toileting, which includes using toliet, bedpan, or urinal?: A Little Help from another person bathing (including washing, rinsing, drying)?: A Little Help from another person to put on and taking off regular upper body clothing?: A Lot Help from another person to put on and taking off regular lower body clothing?: A Lot 6 Click Score: 16   End of Session Nurse Communication:  (OT education complete)  Activity Tolerance: Patient tolerated treatment well Patient left: in chair  OT Visit Diagnosis: Pain Pain - Right/Left: Left Pain - part of body: Shoulder                Time: 1118-1140 OT Time Calculation (min): 22 min Charges:  OT General Charges $OT Visit: 1 Visit OT Evaluation $OT Eval Low Complexity: 1 Low  Paton Crum, OTR/L [place]  Office [phone] Pager: [phone]   Lenward Chancellor 10/15/2020, 12:54 PM

## 2020-10-16 DIAGNOSIS — Z96612 Presence of left artificial shoulder joint: Secondary | ICD-10-CM | POA: Diagnosis not present

## 2020-10-16 DIAGNOSIS — M6389 Disorders of muscle in diseases classified elsewhere, multiple sites: Secondary | ICD-10-CM | POA: Diagnosis not present

## 2020-10-16 DIAGNOSIS — Z471 Aftercare following joint replacement surgery: Secondary | ICD-10-CM | POA: Diagnosis not present

## 2020-10-16 NOTE — Op Note (Signed)
NAME: [name], [name] MEDICAL RECORD NO: [id number] ACCOUNT NO: [id number] DATE OF BIRTH: January 17, 1941 FACILITY: [facility] LOCATION: [facility] PHYSICIAN: Doran Heater. Veverly Fells, MD  Operative Report   DATE OF PROCEDURE: 10/15/2020  PREOPERATIVE DIAGNOSIS:  Left shoulder rotator cuff tear arthropathy.  POSTOPERATIVE DIAGNOSIS:  Left shoulder rotator cuff tear arthropathy.  PROCEDURE PERFORMED:  Left reverse total shoulder replacement using DePuy Delta Xtend prosthesis, no subscapularis repair.  ATTENDING SURGEON:  Esmond Plants, MD  ASSISTANT:  Darol Destine, [place], who was scrubbed during the entire procedure and necessary for satisfactory completion of surgery.  ANESTHESIA:  General anesthesia was used plus interscalene block.  ESTIMATED BLOOD LOSS:  150 mL  FLUID REPLACEMENT:  1500 mL crystalloid.  Instrument counts correct.  There were no complications.  Perioperative antibiotics were given.  INDICATIONS:  The patient is an 80 year old female with worsening left shoulder pain and dysfunction secondary to end-stage arthritis with rotator cuff insufficiency.  Given failure of conservative management over an extended period of time, we discussed  options for management, recommending reverse shoulder replacement and the patient agreed.  Informed consent obtained.  DESCRIPTION OF PROCEDURE:  After an adequate level of anesthesia was achieved, the patient was positioned in modified beach chair position.  Left shoulder correctly identified and sterilely prepped and draped in the usual manner.  Timeout called,  verifying correct patient, correct site.  We entered the patient's shoulder after a sterile prep and drape, using a standard deltopectoral incision starting at the coracoid process extending down to the anterior humerus, dissection down through  subcutaneous tissues using Bovie.  We identified the cephalic vein and took that laterally with the deltoid, pectoralis was taken medially.   Conjoined tendon identified and retracted medially.  The patient had no subscapularis and very little rotator  cuff on top.  We removed the inferior capsule, externally rotating and delivering the humeral head out of the wound.  There was some teres minor and a little bit of infraspinatus left in the back.  We left that intact.  We entered the proximal humerus,  which was devoid of cartilage.  Using a 6 mm reamer, reamed up to a size 10.  We then placed our 10 mm T handled resection guide in place, set on 10 degrees of retroversion, resected the humeral head at the appropriate angle.  Once we had the head  removed and taken to the back table for bone graft, we removed excess osteophytes with a rongeur.  We then subluxed the humerus posteriorly.  We gained good exposure of the glenoid face.  We did a 360-degree labrum and capsule removal.  We removed the  remaining cartilage from the glenoid, found our center point, drilled our guide pin and then reamed for the metaglene baseplate.  We then drilled out our central peg hole, did our peripheral hand reaming and impacted the metaglene into position.  We were  only able to get 2 screws in, because of how small her glenoid was, we placed an inferior screw 42 locked and a 30 nonlocked proximally at the base of the coracoid, so we had good baseplate security and stability and support.  We then placed a 38  standard glenosphere onto the baseplate and screwed down into position.  I did a finger sweep to make sure that we had a clean bearing and we had no soft tissue caught up between the baseplate and the glenosphere.  Next, we went ahead and addressed the  metaphyseal preparation on the humeral side.  We reamed for the one left metaphysis and impacted the trial stem size 10 and one left, set on the 0 setting and impacted in 10 degrees of retroversion.  We took a 38+3 trial, placed that on the humeral tray  and reduced the shoulder.  We were pleased with our soft  tissue balancing and stability.  We removed all trial components, irrigated thoroughly and used available bone graft from the humeral head and impaction grafting technique with our press-fit HA  coated stem size 10 and one left metaphysis set on 0 setting and placed in 10 degrees of retroversion. Using our impaction grafting technique, we had great stem security.  We selected a 38+3 real poly and impacted that on the humeral tray and reduced the  shoulder with a little pop.  We then irrigated thoroughly, removed our traction sutures, then repaired the deltopectoral interval with 0 Vicryl suture followed by 2-0 Vicryl for subcutaneous closure and 4-0 running Monocryl for skin.  Steri-Strips and a  sterile dressing and shoulder sling applied.  The patient transported to recovery room in stable condition.   SHW D: 10/15/2020 9:25:13 am T: 10/16/2020 1:21:00 am  JOB: [id number]/ [id number]

## 2020-10-18 ENCOUNTER — Other Ambulatory Visit: Payer: Self-pay | Admitting: Family Medicine

## 2020-10-18 ENCOUNTER — Encounter (HOSPITAL_COMMUNITY): Payer: Self-pay | Admitting: Orthopedic Surgery

## 2020-10-18 DIAGNOSIS — F418 Other specified anxiety disorders: Secondary | ICD-10-CM | POA: Diagnosis not present

## 2020-10-18 DIAGNOSIS — M6389 Disorders of muscle in diseases classified elsewhere, multiple sites: Secondary | ICD-10-CM | POA: Diagnosis not present

## 2020-10-18 DIAGNOSIS — Z471 Aftercare following joint replacement surgery: Secondary | ICD-10-CM | POA: Diagnosis not present

## 2020-10-18 DIAGNOSIS — Z96612 Presence of left artificial shoulder joint: Secondary | ICD-10-CM | POA: Diagnosis not present

## 2020-10-18 DIAGNOSIS — E119 Type 2 diabetes mellitus without complications: Secondary | ICD-10-CM | POA: Diagnosis not present

## 2020-10-19 DIAGNOSIS — M19012 Primary osteoarthritis, left shoulder: Secondary | ICD-10-CM | POA: Diagnosis not present

## 2020-10-19 DIAGNOSIS — Z789 Other specified health status: Secondary | ICD-10-CM | POA: Diagnosis not present

## 2020-10-19 DIAGNOSIS — E119 Type 2 diabetes mellitus without complications: Secondary | ICD-10-CM | POA: Diagnosis not present

## 2020-10-19 DIAGNOSIS — M6389 Disorders of muscle in diseases classified elsewhere, multiple sites: Secondary | ICD-10-CM | POA: Diagnosis not present

## 2020-10-19 DIAGNOSIS — K900 Celiac disease: Secondary | ICD-10-CM | POA: Diagnosis not present

## 2020-10-19 DIAGNOSIS — Z96612 Presence of left artificial shoulder joint: Secondary | ICD-10-CM | POA: Diagnosis not present

## 2020-10-19 DIAGNOSIS — F320 Major depressive disorder, single episode, mild: Secondary | ICD-10-CM | POA: Diagnosis not present

## 2020-10-19 DIAGNOSIS — Z7409 Other reduced mobility: Secondary | ICD-10-CM | POA: Diagnosis not present

## 2020-10-19 DIAGNOSIS — Z471 Aftercare following joint replacement surgery: Secondary | ICD-10-CM | POA: Diagnosis not present

## 2020-10-19 NOTE — Anesthesia Postprocedure Evaluation (Signed)
Anesthesia Post Note  Patient: [name]  Procedure(s) Performed: REVERSE SHOULDER ARTHROPLASTY (Left Shoulder)     Patient location during evaluation: PACU Anesthesia Type: Regional and General Level of consciousness: awake and alert Pain management: pain level controlled Vital Signs Assessment: post-procedure vital signs reviewed and stable Respiratory status: spontaneous breathing, nonlabored ventilation, respiratory function stable and patient connected to nasal cannula oxygen Cardiovascular status: blood pressure returned to baseline and stable Postop Assessment: no apparent nausea or vomiting Anesthetic complications: no   No complications documented.  Last Vitals:  Vitals:   10/15/20 1100 10/15/20 1200  BP: 129/81 132/69  Pulse: 81 80  Resp: 17 16  Temp:  36.6 C  SpO2: 94% 94%    Last Pain:  Vitals:   10/15/20 1200  TempSrc:   PainSc: 0-No pain                 Retia Cordle

## 2020-10-20 DIAGNOSIS — Z96612 Presence of left artificial shoulder joint: Secondary | ICD-10-CM | POA: Diagnosis not present

## 2020-10-20 DIAGNOSIS — Z471 Aftercare following joint replacement surgery: Secondary | ICD-10-CM | POA: Diagnosis not present

## 2020-10-20 DIAGNOSIS — M6389 Disorders of muscle in diseases classified elsewhere, multiple sites: Secondary | ICD-10-CM | POA: Diagnosis not present

## 2020-10-21 DIAGNOSIS — M6389 Disorders of muscle in diseases classified elsewhere, multiple sites: Secondary | ICD-10-CM | POA: Diagnosis not present

## 2020-10-21 DIAGNOSIS — Z96612 Presence of left artificial shoulder joint: Secondary | ICD-10-CM | POA: Diagnosis not present

## 2020-10-21 DIAGNOSIS — Z471 Aftercare following joint replacement surgery: Secondary | ICD-10-CM | POA: Diagnosis not present

## 2020-10-25 DIAGNOSIS — Z471 Aftercare following joint replacement surgery: Secondary | ICD-10-CM | POA: Diagnosis not present

## 2020-10-25 DIAGNOSIS — M6389 Disorders of muscle in diseases classified elsewhere, multiple sites: Secondary | ICD-10-CM | POA: Diagnosis not present

## 2020-10-25 DIAGNOSIS — Z96612 Presence of left artificial shoulder joint: Secondary | ICD-10-CM | POA: Diagnosis not present

## 2020-10-26 DIAGNOSIS — Z471 Aftercare following joint replacement surgery: Secondary | ICD-10-CM | POA: Diagnosis not present

## 2020-10-26 DIAGNOSIS — Z96612 Presence of left artificial shoulder joint: Secondary | ICD-10-CM | POA: Diagnosis not present

## 2020-10-26 DIAGNOSIS — M6389 Disorders of muscle in diseases classified elsewhere, multiple sites: Secondary | ICD-10-CM | POA: Diagnosis not present

## 2020-10-26 DIAGNOSIS — Z4789 Encounter for other orthopedic aftercare: Secondary | ICD-10-CM | POA: Diagnosis not present

## 2020-10-27 DIAGNOSIS — M6389 Disorders of muscle in diseases classified elsewhere, multiple sites: Secondary | ICD-10-CM | POA: Diagnosis not present

## 2020-10-27 DIAGNOSIS — Z471 Aftercare following joint replacement surgery: Secondary | ICD-10-CM | POA: Diagnosis not present

## 2020-10-27 DIAGNOSIS — Z96612 Presence of left artificial shoulder joint: Secondary | ICD-10-CM | POA: Diagnosis not present

## 2020-11-09 ENCOUNTER — Other Ambulatory Visit (HOSPITAL_COMMUNITY): Payer: Medicare Other

## 2020-11-18 ENCOUNTER — Other Ambulatory Visit: Payer: Self-pay | Admitting: Family Medicine

## 2020-11-19 NOTE — Telephone Encounter (Signed)
Fenofibrate filled by a historical provider. Okay to fill?

## 2020-11-25 DIAGNOSIS — Z4789 Encounter for other orthopedic aftercare: Secondary | ICD-10-CM | POA: Diagnosis not present

## 2020-12-01 DIAGNOSIS — J42 Unspecified chronic bronchitis: Secondary | ICD-10-CM | POA: Diagnosis not present

## 2020-12-01 DIAGNOSIS — J4540 Moderate persistent asthma, uncomplicated: Secondary | ICD-10-CM | POA: Diagnosis not present

## 2020-12-02 ENCOUNTER — Telehealth: Payer: Self-pay

## 2020-12-02 NOTE — Chronic Care Management (AMB) (Signed)
Chronic Care Management Pharmacy Assistant   Name: LADEJA PELHAM  MRN: [id number] DOB: Jun 06, 1941  Reason for Encounter: General Adherence Call  Recent office visits:  07/30/20- Annye Asa, MD- seen for chronic diarrhea, patient instructed to restart daily probiotic, referral to gastroenterology, follow up as needed   Recent consult visits:  10/19/20- Merilynn Finland, MD( [place])- seen for should pain, no medication changes, follow up with ortho 1 week 09/24/20- Levin Erp, PA (Gastroenterology)- seen for follow up for chronic diarrhea,  patient to continue her Budesonide 9 mg once daily for total of 8 weeks, then she should decrease to 6 mg for 2 weeks and then 3 mg for 2 weeks, follow up prn  08/20/20- Fabio Asa, Lavone Nian, PA (Gastroenterology)- seen for chronic diarrhea, started Budesonide 9 mg for 4-6 weeks then taper, follow up 4-6 weeks  07/14/20- Billie Ruddy, ANP ( [place])- seen for sore throat, no medication changes, follow up as needed  Hospital visits:  Medication Reconciliation was completed by comparing discharge summary, patient's EMR and Pharmacy list, and upon discussion with patient.  Same day discharge from [facility] on 10/15/20 due to reverse shoulder arthroplasty   New?Medications Started at [facility] Discharge:?? HYDROcodone-acetaminophen (Norco) methocarbamol (Robaxin) 500 prn q8h  Medications Discontinued at Hospital Discharge: saccharomyces boulardii 250 MG capsule (Florastor)  All other medications remain the same after Hospital Discharge:??    Medications: Outpatient Encounter Medications as of 12/02/2020  Medication Sig   atorvastatin (LIPITOR) 20 MG tablet TAKE ONE (1) TABLET BY MOUTH EVERY DAY   Biotin 5000 MCG CAPS Take 5,000 mcg by mouth daily.   budesonide (ENTOCORT EC) 3 MG 24 hr capsule Take 3 capsules (9 mg total) by mouth daily.   citalopram (CELEXA) 40  MG tablet TAKE ONE (1) TABLET BY MOUTH EVERY DAY (Patient taking differently: Take 40 mg by mouth daily.)   famotidine (PEPCID) 40 MG tablet TAKE TWO TABLETS BY MOUTH EVERY NIGHT AT BEDTIME   fenofibrate (TRICOR) 145 MG tablet TAKE ONE TABLET BY MOUTH EVERY MORNING AFTER BREAKFAST   Ferrous Sulfate (IRON PO) Take by mouth once a week.   HYDROcodone-acetaminophen (NORCO) 5-325 MG tablet Take 1 tablet by mouth every 6 (six) hours as needed for moderate pain or severe pain.   ibuprofen (ADVIL) 200 MG tablet Take 200 mg by mouth 2 (two) times daily.    LUMIGAN 0.01 % SOLN Place 1 drop into both eyes at bedtime.    magnesium 30 MG tablet Take 30 mg by mouth 2 (two) times daily.   metFORMIN (GLUCOPHAGE) 1000 MG tablet Take 1,000 mg by mouth 2 (two) times daily.   methocarbamol (ROBAXIN) 500 MG tablet Take 1 tablet (500 mg total) by mouth every 8 (eight) hours as needed for muscle spasms.   montelukast (SINGULAIR) 10 MG tablet Take 1 tablet (10 mg total) by mouth at bedtime.   Multiple Vitamins-Minerals (CENTRUM SILVER PO) Take 1 tablet by mouth daily.   No facility-administered encounter medications on file as of 12/02/2020.   I spoke with Ms. Bureau this morning and she was sleeping when I called. She was up at 5 this morning and decided to get some more rest before starting her day. We were able to speak about some of her concerns although she was tired. She has not been able to start doing yoga again since her shoulder surgery and has another couple of weeks before she completely heals, but stated  her recovery is going well. Although her recovery is going well, Ms. Musto stated that she has been experiencing issues that she believes is the result of the combination of medication she takes. She described the phenomenon as feeling as though all of her fluids were being eradicated upon waking up. She stated that she uses the bathroom at least 2-3 times per night and has pain in her lower back when getting  up in the morning. After drinking water or coffee she usually feels better and doesn't have the pain, but she is still concerned that she is dehydrating too often. Since this started she has taken up an increased level of walking and drinks water and coffee in substantial amounts. In a few weeks she is taking a trip with her children and is hoping to get this situated so that she may enjoy it. Her care team was contacted to coordinate any necessary evaluations as she requested. Other than this, Ms. Potvin has no other concerns and is pleased with the interactions she has with her care team.  Star Rating Drugs: Atorvastatin 20 mg- 30 DS last filled 11/19/20  [place] Sports CPA, [place]

## 2020-12-08 ENCOUNTER — Ambulatory Visit (INDEPENDENT_AMBULATORY_CARE_PROVIDER_SITE_OTHER): Payer: Medicare Other | Admitting: Family Medicine

## 2020-12-08 ENCOUNTER — Other Ambulatory Visit: Payer: Self-pay

## 2020-12-08 ENCOUNTER — Encounter: Payer: Self-pay | Admitting: Family Medicine

## 2020-12-08 VITALS — BP 120/70 | HR 80 | Temp 97.7°F | Resp 20 | Ht 63.0 in | Wt 160.0 lb

## 2020-12-08 DIAGNOSIS — M545 Low back pain, unspecified: Secondary | ICD-10-CM | POA: Diagnosis not present

## 2020-12-08 DIAGNOSIS — G8929 Other chronic pain: Secondary | ICD-10-CM

## 2020-12-08 DIAGNOSIS — F418 Other specified anxiety disorders: Secondary | ICD-10-CM | POA: Diagnosis not present

## 2020-12-08 DIAGNOSIS — R350 Frequency of micturition: Secondary | ICD-10-CM | POA: Diagnosis not present

## 2020-12-08 LAB — POCT URINALYSIS DIPSTICK
Bilirubin, UA: NEGATIVE
Blood, UA: NEGATIVE
Glucose, UA: NEGATIVE
Ketones, UA: NEGATIVE
Leukocytes, UA: NEGATIVE
Nitrite, UA: NEGATIVE
Protein, UA: NEGATIVE
Spec Grav, UA: 1.015 (ref 1.010–1.025)
Urobilinogen, UA: 0.2 E.U./dL
pH, UA: 6 (ref 5.0–8.0)

## 2020-12-08 MED ORDER — MIRABEGRON ER 25 MG PO TB24: 25.0000 mg | ORAL_TABLET | Freq: Every day | ORAL | 3 refills | Status: DC

## 2020-12-08 NOTE — Assessment & Plan Note (Signed)
Deteriorated.  Pt has recently lost 2 close friends and now brother in law is not well.  They are very close and have been for 60 yrs.  She is having a hard time with the idea of losing him too.  She is not interested in med changes at this time but she will let me know if things change.

## 2020-12-08 NOTE — Progress Notes (Signed)
   Subjective:    Patient ID: [name], female    DOB: 06/22/1940, 80 y.o.   MRN: [id number]  HPI Urinary frequency- pt reports she is going at least 2-3x/night.  Pt reports she is drinking 'a lot of tea', water, milk.  Says she will get about 8 ft and 'it will come pouring out of me'.  + urgency but no overflow during the day.  LBP- pt had L shoulder surgery and is still recovering.  When she wakes up, she has a difficult time getting out of bed and has a lot of pain bending forward.  Pt reports 'after 2-3 cups of coffee my back is better'.  Pt has Methocarbamol available.  Has not been seeing chiropractor or doing her exercises  Depression/anxiety- she has recently lost 2 close friends and brother-in-law is now dying.  She is having a hard time.  Currently on Citalopram 40mg  daily.   Review of Systems For ROS see HPI   This visit occurred during the SARS-CoV-2 public health emergency.  Safety protocols were in place, including screening questions prior to the visit, additional usage of staff PPE, and extensive cleaning of exam room while observing appropriate contact time as indicated for disinfecting solutions.      Objective:   Physical Exam Vitals reviewed.  Constitutional:      General: She is not in acute distress.    Appearance: Normal appearance. She is not ill-appearing.     Comments: Stiff and appears uncomfortable  HENT:     Head: Normocephalic and atraumatic.  Eyes:     Extraocular Movements: Extraocular movements intact.     Conjunctiva/sclera: Conjunctivae normal.     Pupils: Pupils are equal, round, and reactive to light.  Pulmonary:     Effort: Pulmonary effort is normal.  Abdominal:     Tenderness: There is no right CVA tenderness or left CVA tenderness.  Musculoskeletal:        General: Tenderness (TTP over SI joints bilaterally) present.     Cervical back: Normal range of motion and neck supple.  Skin:    General: Skin is warm and dry.  Neurological:      Mental Status: She is alert and oriented to person, place, and time.     Cranial Nerves: No cranial nerve deficit.     Gait: Gait abnormal (stiff, antalgic).          Assessment & Plan:  Urinary frequency- pt reports this only seems to occur at night and will often leave her w/ urge or overflow incontinence.  Discussed that both coffee and tea are diuretics and she should decrease her intake of both.  UA WNL.  Start Myrbetriq and monitor for symptom improvement.  Pt expressed understanding and is in agreement w/ plan.

## 2020-12-08 NOTE — Patient Instructions (Signed)
Follow up as needed or as scheduled No evidence of UTI- great news! LIMIT your intake of coffee and tea- this acts as a diuretic Drink plenty of water START the Methocarbamol before bed to help w/ back stiffness If you will be seated for a long period of time- use a heating pad when possible We'll call you with your physical therapy START the Mybetriq to improve your bladder symptoms Call with any questions or concerns Hang in there!!

## 2020-12-08 NOTE — Assessment & Plan Note (Signed)
Pt has hx of similar.  Currently w/ SI joint pain and stiffness.  Start to use Robaxin regularly at night.  Encouraged gentle stretching and a heating pad.  Will refer to PT for complete evaluation and tx.

## 2020-12-13 DIAGNOSIS — M545 Low back pain, unspecified: Secondary | ICD-10-CM | POA: Diagnosis not present

## 2020-12-13 DIAGNOSIS — R2681 Unsteadiness on feet: Secondary | ICD-10-CM | POA: Diagnosis not present

## 2020-12-14 DIAGNOSIS — R2681 Unsteadiness on feet: Secondary | ICD-10-CM | POA: Diagnosis not present

## 2020-12-14 DIAGNOSIS — M545 Low back pain, unspecified: Secondary | ICD-10-CM | POA: Diagnosis not present

## 2020-12-15 ENCOUNTER — Encounter: Payer: Self-pay | Admitting: *Deleted

## 2020-12-23 DIAGNOSIS — M545 Low back pain, unspecified: Secondary | ICD-10-CM | POA: Diagnosis not present

## 2020-12-23 DIAGNOSIS — Z4789 Encounter for other orthopedic aftercare: Secondary | ICD-10-CM | POA: Diagnosis not present

## 2020-12-23 DIAGNOSIS — R2681 Unsteadiness on feet: Secondary | ICD-10-CM | POA: Diagnosis not present

## 2020-12-27 DIAGNOSIS — M545 Low back pain, unspecified: Secondary | ICD-10-CM | POA: Diagnosis not present

## 2020-12-27 DIAGNOSIS — R2681 Unsteadiness on feet: Secondary | ICD-10-CM | POA: Diagnosis not present

## 2020-12-29 DIAGNOSIS — M545 Low back pain, unspecified: Secondary | ICD-10-CM | POA: Diagnosis not present

## 2020-12-29 DIAGNOSIS — R2681 Unsteadiness on feet: Secondary | ICD-10-CM | POA: Diagnosis not present

## 2020-12-30 DIAGNOSIS — R2681 Unsteadiness on feet: Secondary | ICD-10-CM | POA: Diagnosis not present

## 2020-12-30 DIAGNOSIS — M545 Low back pain, unspecified: Secondary | ICD-10-CM | POA: Diagnosis not present

## 2020-12-31 DIAGNOSIS — Z20822 Contact with and (suspected) exposure to covid-19: Secondary | ICD-10-CM | POA: Diagnosis not present

## 2021-01-03 DIAGNOSIS — R2681 Unsteadiness on feet: Secondary | ICD-10-CM | POA: Diagnosis not present

## 2021-01-03 DIAGNOSIS — M545 Low back pain, unspecified: Secondary | ICD-10-CM | POA: Diagnosis not present

## 2021-01-05 DIAGNOSIS — M545 Low back pain, unspecified: Secondary | ICD-10-CM | POA: Diagnosis not present

## 2021-01-05 DIAGNOSIS — R2681 Unsteadiness on feet: Secondary | ICD-10-CM | POA: Diagnosis not present

## 2021-01-11 DIAGNOSIS — U071 COVID-19: Secondary | ICD-10-CM | POA: Diagnosis not present

## 2021-01-17 DIAGNOSIS — U099 Post covid-19 condition, unspecified: Secondary | ICD-10-CM | POA: Diagnosis not present

## 2021-01-20 ENCOUNTER — Telehealth: Payer: Self-pay | Admitting: Family Medicine

## 2021-01-20 NOTE — Telephone Encounter (Signed)
..  Type of form received:Physical Therapy report  Additional comments:   Received by:   [name] should be Faxed to:[phone]  Form should be mailed to:    Is patient requesting call for pickup:   Form placed:  In Dr. Virgil Benedict bin up front  Attach charge sheet.  Provider will determine charge.  Individual made aware of 3-5 business day turn around (Y/N)?

## 2021-01-24 DIAGNOSIS — R2681 Unsteadiness on feet: Secondary | ICD-10-CM | POA: Diagnosis not present

## 2021-01-24 DIAGNOSIS — M545 Low back pain, unspecified: Secondary | ICD-10-CM | POA: Diagnosis not present

## 2021-01-25 ENCOUNTER — Telehealth (INDEPENDENT_AMBULATORY_CARE_PROVIDER_SITE_OTHER): Payer: Medicare Other | Admitting: Registered Nurse

## 2021-01-25 ENCOUNTER — Encounter: Payer: Self-pay | Admitting: Registered Nurse

## 2021-01-25 ENCOUNTER — Other Ambulatory Visit: Payer: Self-pay

## 2021-01-25 DIAGNOSIS — U099 Post covid-19 condition, unspecified: Secondary | ICD-10-CM | POA: Diagnosis not present

## 2021-01-25 MED ORDER — AZITHROMYCIN 250 MG PO TABS: ORAL_TABLET | ORAL | 0 refills | Status: AC

## 2021-01-25 MED ORDER — HYDROXYZINE HCL 10 MG PO TABS: 5.0000 mg | ORAL_TABLET | Freq: Every evening | ORAL | 0 refills | Status: DC | PRN

## 2021-01-25 MED ORDER — PREDNISONE 20 MG PO TABS: 20.0000 mg | ORAL_TABLET | Freq: Every day | ORAL | 0 refills | Status: DC

## 2021-01-25 NOTE — Progress Notes (Signed)
Telemedicine Encounter- SOAP NOTE Established Patient  This telephone encounter was conducted with the patient's (or proxy's) verbal consent via audio telecommunications: yes/no: Yes Patient was instructed to have this encounter in a suitably private space; and to only have persons present to whom they give permission to participate. In addition, patient identity was confirmed by use of name plus two identifiers (DOB and address).  I discussed the limitations, risks, security and privacy concerns of performing an evaluation and management service by telephone and the availability of in person appointments. I also discussed with the patient that there may be a patient responsible charge related to this service. The patient expressed understanding and agreed to proceed.  I spent a total of  17 minutes talking with the patient or their proxy.  Patient at home Provider in office  Participants: Kathrin Ruddy, NP and Lottie Dawson  Chief Complaint  Patient presents with   Covid Positive    Patient states she had covid since 01/04/2021. Patient states she has still been experiencing some fatigue , getting very hot, cough. Patient states she has been taking some decongestant and some nasal spray.    Subjective   [name] is a 80 y.o. established patient. Telephone visit today for ongoing COVID symptoms  HPI Initial symptoms on 7/19-20 Tested positive on 7/26 after multiple negative home tests Pursued conservative treatment Continued with asthma and allergy treatment  Notes symptoms largely improved, but now plateau Still some wheezing, easy shob, doe, fatigued Less feverish than before Still some coughing Non productive.  Otherwise no concerns.   Patient Active Problem List   Diagnosis Date Noted   Other muscle spasm 10/15/2020   Gastro-esophageal reflux disease without esophagitis 10/15/2020   Overweight (BMI 25.0-29.9) 06/01/2020   Thyroid nodule 03/13/2018   Chronic  diarrhea 12/26/2017   S/P shoulder replacement, right 09/15/2016   Benign paroxysmal positional vertigo 10/14/2014   Back pain 08/27/2014   Physical exam 08/27/2014   Decreased hearing of both ears 05/19/2014   S/P cholecystectomy 10/02/2013   Asthma, moderate persistent 05/16/2013   H/O recurrent pneumonia 05/16/2013   Insomnia 05/05/2013   Gluten intolerance 05/06/2012   Renal calculus, left 01/29/2012   Painful bladder spasm 12/14/2011   Lactose intolerance 12/14/2011   Post-menopausal 12/14/2011   Microscopic colitis 11/08/2011   Hepatic steatosis 08/25/2011   Pulmonary nodule, right 08/25/2011   Hematuria 08/23/2011   Memory loss 12/12/2010   Diabetes mellitus type II, controlled, with no complications ([place]) [id number]   Hyperlipidemia 07/14/2010   Depression with anxiety 07/14/2010   GLAUCOMA 07/14/2010   ALLERGIC RHINITIS 07/14/2010    Past Medical History:  Diagnosis Date   Allergic rhinitis    Asthma    Diabetes mellitus    just changed from injection to metformin ? month   Dysrhythmia    palpitations   GERD (gastroesophageal reflux disease)    Glaucoma    History of kidney stones    Hyperlipemia    Migraines    Palpitations    Pneumonia    hx   Urinary tract infection     Current Outpatient Medications  Medication Sig Dispense Refill   atorvastatin (LIPITOR) 20 MG tablet TAKE ONE (1) TABLET BY MOUTH EVERY DAY 90 tablet 1   azithromycin (ZITHROMAX) 250 MG tablet Take 2 tablets on day 1, then 1 tablet daily on days 2 through 5 6 tablet 0   Biotin 5000 MCG CAPS Take 5,000 mcg by mouth daily.  budesonide (ENTOCORT EC) 3 MG 24 hr capsule Take 3 capsules (9 mg total) by mouth daily. 42 capsule 0   citalopram (CELEXA) 40 MG tablet TAKE ONE (1) TABLET BY MOUTH EVERY DAY (Patient taking differently: Take 40 mg by mouth daily.) 90 tablet 1   famotidine (PEPCID) 40 MG tablet TAKE TWO TABLETS BY MOUTH EVERY NIGHT AT BEDTIME 180 tablet 1   fenofibrate (TRICOR)  145 MG tablet TAKE ONE TABLET BY MOUTH EVERY MORNING AFTER BREAKFAST 90 tablet 1   Ferrous Sulfate (IRON PO) Take by mouth once a week.     HYDROcodone-acetaminophen (NORCO) 5-325 MG tablet Take 1 tablet by mouth every 6 (six) hours as needed for moderate pain or severe pain. 30 tablet 0   hydrOXYzine (ATARAX/VISTARIL) 10 MG tablet Take 0.5-1 tablets (5-10 mg total) by mouth at bedtime as needed (for sleep / wheezing). 20 tablet 0   ibuprofen (ADVIL) 200 MG tablet Take 200 mg by mouth 2 (two) times daily.      LUMIGAN 0.01 % SOLN Place 1 drop into both eyes at bedtime.      magnesium 30 MG tablet Take 30 mg by mouth 2 (two) times daily.     metFORMIN (GLUCOPHAGE) 1000 MG tablet Take 1,000 mg by mouth 2 (two) times daily.     methocarbamol (ROBAXIN) 500 MG tablet Take 1 tablet (500 mg total) by mouth every 8 (eight) hours as needed for muscle spasms. 40 tablet 1   mirabegron ER (MYRBETRIQ) 25 MG TB24 tablet Take 1 tablet (25 mg total) by mouth daily. 30 tablet 3   montelukast (SINGULAIR) 10 MG tablet Take 1 tablet (10 mg total) by mouth at bedtime. 90 tablet 3   Multiple Vitamins-Minerals (CENTRUM SILVER PO) Take 1 tablet by mouth daily.     predniSONE (DELTASONE) 20 MG tablet Take 1 tablet (20 mg total) by mouth daily with breakfast. 4 tablet 0   No current facility-administered medications for this visit.    Allergies  Allergen Reactions   Papain Anaphylaxis    Takes an extreme concentration   Papaya Derivatives Anaphylaxis   Mushroom Ext Cmplx(Shiitake-Reishi-Mait) Other (See Comments)    Head congestion and sore throat   Mushroom Extract Complex Other (See Comments)    Head congestion and sore throat after eating 2 days in row   Venlafaxine Other (See Comments)    UNSPECIFIED REACTION  Pt states she can take Name brand. Lethargy    Social History   Socioeconomic History   Marital status: Widowed    Spouse name: Not on file   Number of children: 2   Years of education: Not on  file   Highest education level: Not on file  Occupational History   Occupation: Retired -Sports coach: RETIRED  Tobacco Use   Smoking status: Former    Packs/day: 0.10    Years: 4.00    Pack years: 0.40    Types: Cigarettes    Quit date: 06/19/1986    Years since quitting: 34.6   Smokeless tobacco: Never   Tobacco comments:    smoked in college years ago  Vaping Use   Vaping Use: Never used  Substance and Sexual Activity   Alcohol use: Yes    Comment: 1-2 glasses week variety beer occ scotch   Drug use: No   Sexual activity: Not Currently    Birth control/protection: Surgical  Other Topics Concern   Not on file  Social History Narrative   Daily caffeine  Social Determinants of Health   Financial Resource Strain: Low Risk    Difficulty of Paying Living Expenses: Not hard at all  Food Insecurity: No Food Insecurity   Worried About Charity fundraiser in the Last Year: Never true   [place] in the Last Year: Never true  Transportation Needs: No Transportation Needs   Lack of Transportation (Medical): No   Lack of Transportation (Non-Medical): No  Physical Activity: Sufficiently Active   Days of Exercise per Week: 3 days   Minutes of Exercise per Session: 50 min  Stress: No Stress Concern Present   Feeling of Stress : Not at all  Social Connections: Moderately Integrated   Frequency of Communication with Friends and Family: More than three times a week   Frequency of Social Gatherings with Friends and Family: More than three times a week   Attends Religious Services: More than 4 times per year   Active Member of [employer] or Organizations: Yes   Attends Archivist Meetings: 1 to 4 times per year   Marital Status: Widowed  Human resources officer Violence: Not At Risk   Fear of Current or Ex-Partner: No   Emotionally Abused: No   Physically Abused: No   Sexually Abused: No    Review of Systems  Constitutional:  Positive for malaise/fatigue.  Negative for chills, diaphoresis, fever and weight loss.  HENT: Negative.    Eyes: Negative.   Respiratory:  Positive for cough, shortness of breath and wheezing. Negative for hemoptysis and sputum production.   Cardiovascular: Negative.   Gastrointestinal: Negative.   Genitourinary: Negative.   Musculoskeletal: Negative.   Skin: Negative.   Neurological: Negative.   Endo/Heme/Allergies: Negative.   Psychiatric/Behavioral: Negative.     Objective   Vitals as reported by the patient: There were no vitals filed for this visit.  Ceasia was seen today for covid positive.  Diagnoses and all orders for this visit:  Long COVID -     azithromycin (ZITHROMAX) 250 MG tablet; Take 2 tablets on day 1, then 1 tablet daily on days 2 through 5 -     predniSONE (DELTASONE) 20 MG tablet; Take 1 tablet (20 mg total) by mouth daily with breakfast. -     hydrOXYzine (ATARAX/VISTARIL) 10 MG tablet; Take 0.5-1 tablets (5-10 mg total) by mouth at bedtime as needed (for sleep / wheezing).   PLAN Concern for a diffuse PNA. Will give z pack and prednisone Will give hydroxyzine for helping sleep with prednisone on board. Follow up if worsening or failing to improve. Should consider pulmonology referral Patient encouraged to call clinic with any questions, comments, or concerns.  I discussed the assessment and treatment plan with the patient. The patient was provided an opportunity to ask questions and all were answered. The patient agreed with the plan and demonstrated an understanding of the instructions.   The patient was advised to call back or seek an in-person evaluation if the symptoms worsen or if the condition fails to improve as anticipated.  I provided 17 minutes of non-face-to-face time during this encounter.  Maximiano Coss, NP  [facility]

## 2021-01-25 NOTE — Patient Instructions (Signed)
° ° ° °  If you have lab work done today you will be contacted with your lab results within the next 2 weeks.  If you have not heard from us then please contact us. The fastest way to get your results is to register for My Chart. ° ° °IF you received an x-ray today, you will receive an invoice from [facility]. Please contact [facility] at [phone] with questions or concerns regarding your invoice.  ° °IF you received labwork today, you will receive an invoice from [facility]. Please contact [facility] at [phone] with questions or concerns regarding your invoice.  ° °Our billing staff will not be able to assist you with questions regarding bills from these companies. ° °You will be contacted with the lab results as soon as they are available. The fastest way to get your results is to activate your My Chart account. Instructions are located on the last page of this paperwork. If you have not heard from us regarding the results in 2 weeks, please contact this office. °  ° ° ° °

## 2021-01-26 DIAGNOSIS — M545 Low back pain, unspecified: Secondary | ICD-10-CM | POA: Diagnosis not present

## 2021-01-26 DIAGNOSIS — R2681 Unsteadiness on feet: Secondary | ICD-10-CM | POA: Diagnosis not present

## 2021-01-31 ENCOUNTER — Telehealth: Payer: Self-pay

## 2021-01-31 DIAGNOSIS — R2681 Unsteadiness on feet: Secondary | ICD-10-CM | POA: Diagnosis not present

## 2021-01-31 DIAGNOSIS — M545 Low back pain, unspecified: Secondary | ICD-10-CM | POA: Diagnosis not present

## 2021-01-31 NOTE — Progress Notes (Signed)
Chronic Care Management Pharmacy Assistant   Name: [name]  MRN: [id number] DOB: Sep 01, 1940  Reason for Encounter: Disease State - General Adherence / Schedule CPP Follow up (Nov/Dec)    Recent office visits:  01/25/21 Nedra Hai, NP - Family Medicine (Video visit) - COVID 19 - Azithromycin (ZITHROMAX) 250 MG tablet prescribed. HydrOXYzine (ATARAX/VISTARIL) 10 MG tablet prescribed. PredniSONE (DELTASONE) 20 MG tablet prescribed. Follow up not indicated.  12/08/20 Annye Asa, MD (PCP) - Family Medicine - Chronic bilateral Low back pain - Labs were ordered. Referral placed for Physical Therapy. Mirabegron ER (MYRBETRIQ) 25 MG TB24 tablet Take 1 tablet (25 mg total) by mouth daily prescribed. Follow up not indicated.   Recent consult visits:  01/17/21 Francine Graven, NP - Internal Medicine - Post acute COVID 19 Syndrome - No medication changes. Follow up as needed.   01/11/21 Francine Graven, NP - Internal Medicine - Viral upper Respiratory Infection - COVID test positive in office. Symptomatic care instructions given. Follow up as needed.    Hospital visits:  Medication Reconciliation was completed by comparing discharge summary, patient's EMR and Pharmacy list, and upon discussion with patient.  Admitted to the hospital on 10/15/20 due to H/O Total Shoulder replacement . Discharge date was 10/15/20. Discharged from [facility]?Medications Started at [facility] Discharge:?? Started HYDROcodone-acetaminophen ([place]) 5-325 MG tablet due to surgical pain.   Medication Changes at Hospital Discharge: No medication changes were noted.   Medications Discontinued at Hospital Discharge: Stopped Saccharomyces boulardii (FLORASTOR) 250 MG capsule  Medications that remain the same after Hospital Discharge:??  All other medications will remain the same.    Medications: Outpatient Encounter Medications as of 01/31/2021  Medication Sig   atorvastatin (LIPITOR) 20 MG tablet  TAKE ONE (1) TABLET BY MOUTH EVERY DAY   Biotin 5000 MCG CAPS Take 5,000 mcg by mouth daily.   budesonide (ENTOCORT EC) 3 MG 24 hr capsule Take 3 capsules (9 mg total) by mouth daily.   citalopram (CELEXA) 40 MG tablet TAKE ONE (1) TABLET BY MOUTH EVERY DAY (Patient taking differently: Take 40 mg by mouth daily.)   famotidine (PEPCID) 40 MG tablet TAKE TWO TABLETS BY MOUTH EVERY NIGHT AT BEDTIME   fenofibrate (TRICOR) 145 MG tablet TAKE ONE TABLET BY MOUTH EVERY MORNING AFTER BREAKFAST   Ferrous Sulfate (IRON PO) Take by mouth once a week.   HYDROcodone-acetaminophen (NORCO) 5-325 MG tablet Take 1 tablet by mouth every 6 (six) hours as needed for moderate pain or severe pain.   hydrOXYzine (ATARAX/VISTARIL) 10 MG tablet Take 0.5-1 tablets (5-10 mg total) by mouth at bedtime as needed (for sleep / wheezing).   ibuprofen (ADVIL) 200 MG tablet Take 200 mg by mouth 2 (two) times daily.    LUMIGAN 0.01 % SOLN Place 1 drop into both eyes at bedtime.    magnesium 30 MG tablet Take 30 mg by mouth 2 (two) times daily.   metFORMIN (GLUCOPHAGE) 1000 MG tablet Take 1,000 mg by mouth 2 (two) times daily.   methocarbamol (ROBAXIN) 500 MG tablet Take 1 tablet (500 mg total) by mouth every 8 (eight) hours as needed for muscle spasms.   mirabegron ER (MYRBETRIQ) 25 MG TB24 tablet Take 1 tablet (25 mg total) by mouth daily.   montelukast (SINGULAIR) 10 MG tablet Take 1 tablet (10 mg total) by mouth at bedtime.   Multiple Vitamins-Minerals (CENTRUM SILVER PO) Take 1 tablet by mouth daily.   predniSONE (DELTASONE) 20 MG tablet  Take 1 tablet (20 mg total) by mouth daily with breakfast.   No facility-administered encounter medications on file as of 01/31/2021.   Have you had any problems recently with your health? Patient reports she is much better since getting over COVID infection. She has been having some loose bowels recently and is concerned about how this may affect her upcoming travel plans.   Have you had  any problems with your pharmacy? Patient denied having and issues with her current pharmacy.  What issues or side effects are you having with your medications? Patient denied any current medication side effects or issues.   What would you like me to pass along to Madelin Rear, CPP for them to help you with?  Patient has been experiencing loose bowel since recent COVID infection. She would like a call from CPP to discuss possible probiotics she may take to help with this as she plans to travel in a fw weeks to visit her boyfriend and children in [place] and she is concerned about how this issue may affect her traveling.    What can we do to take care of you better?   Star Rating Drugs: Atorvastatin (LIPITOR) 20 MG tablet - last filled 01/06/21 30 days  Metformin (GLUCOPHAGE) 1000 MG tablet - last filled 01/06/21 30 days   Future Appointments  Date Time Provider [facility]  04/27/2021  3:00 PM [facility] PHARMACIST [facility]    Patient scheduled for Follow up CPP phone visit for : 04/27/21 @ 3:00pm. Patient confirmed appointment date and time.    Jobe Gibbon, [facility] Pharmacist Assistant  [phone]  Time Spent: 40 minutes

## 2021-02-17 ENCOUNTER — Other Ambulatory Visit: Payer: Self-pay | Admitting: Family Medicine

## 2021-03-08 DIAGNOSIS — E119 Type 2 diabetes mellitus without complications: Secondary | ICD-10-CM | POA: Diagnosis not present

## 2021-03-08 DIAGNOSIS — E782 Mixed hyperlipidemia: Secondary | ICD-10-CM | POA: Diagnosis not present

## 2021-03-08 DIAGNOSIS — Z7984 Long term (current) use of oral hypoglycemic drugs: Secondary | ICD-10-CM | POA: Diagnosis not present

## 2021-03-08 DIAGNOSIS — E041 Nontoxic single thyroid nodule: Secondary | ICD-10-CM | POA: Diagnosis not present

## 2021-03-10 DIAGNOSIS — E041 Nontoxic single thyroid nodule: Secondary | ICD-10-CM | POA: Diagnosis not present

## 2021-03-10 DIAGNOSIS — E042 Nontoxic multinodular goiter: Secondary | ICD-10-CM | POA: Diagnosis not present

## 2021-03-14 DIAGNOSIS — Z23 Encounter for immunization: Secondary | ICD-10-CM | POA: Diagnosis not present

## 2021-03-17 ENCOUNTER — Telehealth: Payer: Self-pay | Admitting: Family Medicine

## 2021-03-17 NOTE — Telephone Encounter (Signed)
Labs from 4/22  have been printed and faxed for patient to Dr Posey Pronto at [facility]. Called to inform patient but pt did not answer.

## 2021-03-17 NOTE — Telephone Encounter (Signed)
Patient called and is requesting a copy of her labs done in April, 2022 to be sent to Dr. Deatra Canter at [facility] in [facility].  He is her endocrinologist.  Phone number for office is [phone]

## 2021-03-24 ENCOUNTER — Telehealth: Payer: Self-pay

## 2021-03-24 NOTE — Progress Notes (Signed)
Chronic Care Management Pharmacy Assistant   Name: [name]  MRN: [id number] DOB: October 13, 1940   Reason for Encounter: Disease State - General Adherence Call     Recent office visits:  None noted.   Recent consult visits:  03/08/21 Amalia Greenhouse, MD - Endocrinology - Diabetes - No notes available   Hospital visits: 10/15/20  Medication Reconciliation was completed by comparing discharge summary, patient's EMR and Pharmacy list, and upon discussion with patient.  Admitted to the hospital on 10/15/20 due to H/O total shoulder repair. Discharge date was 10/15/20. Discharged from [facility]?Medications Started at [facility] Discharge:?? Hydrocodone 5/325 and Methocarbamol were prescribed,  Medication Changes at Hospital Discharge: No changes noted.   Medications Discontinued at Hospital Discharge: Discontinued Saccharomyces Boulardii 250 mg   Medications that remain the same after Hospital Discharge:??  All other medications will remain the same.    Medications: Outpatient Encounter Medications as of 03/24/2021  Medication Sig   atorvastatin (LIPITOR) 20 MG tablet TAKE ONE (1) TABLET BY MOUTH EVERY DAY   Biotin 5000 MCG CAPS Take 5,000 mcg by mouth daily.   budesonide (ENTOCORT EC) 3 MG 24 hr capsule Take 3 capsules (9 mg total) by mouth daily.   citalopram (CELEXA) 40 MG tablet TAKE ONE (1) TABLET BY MOUTH EVERY DAY   famotidine (PEPCID) 40 MG tablet TAKE TWO TABLETS BY MOUTH EVERY NIGHT AT BEDTIME   fenofibrate (TRICOR) 145 MG tablet TAKE ONE TABLET BY MOUTH EVERY MORNING AFTER BREAKFAST   Ferrous Sulfate (IRON PO) Take by mouth once a week.   HYDROcodone-acetaminophen (NORCO) 5-325 MG tablet Take 1 tablet by mouth every 6 (six) hours as needed for moderate pain or severe pain.   hydrOXYzine (ATARAX/VISTARIL) 10 MG tablet Take 0.5-1 tablets (5-10 mg total) by mouth at bedtime as needed (for sleep / wheezing).   ibuprofen (ADVIL) 200 MG tablet Take 200 mg  by mouth 2 (two) times daily.    LUMIGAN 0.01 % SOLN Place 1 drop into both eyes at bedtime.    magnesium 30 MG tablet Take 30 mg by mouth 2 (two) times daily.   metFORMIN (GLUCOPHAGE) 1000 MG tablet Take 1,000 mg by mouth 2 (two) times daily.   methocarbamol (ROBAXIN) 500 MG tablet Take 1 tablet (500 mg total) by mouth every 8 (eight) hours as needed for muscle spasms.   mirabegron ER (MYRBETRIQ) 25 MG TB24 tablet Take 1 tablet (25 mg total) by mouth daily.   montelukast (SINGULAIR) 10 MG tablet Take 1 tablet (10 mg total) by mouth at bedtime.   Multiple Vitamins-Minerals (CENTRUM SILVER PO) Take 1 tablet by mouth daily.   predniSONE (DELTASONE) 20 MG tablet Take 1 tablet (20 mg total) by mouth daily with breakfast.   No facility-administered encounter medications on file as of 03/24/2021.    Have you had any problems recently with your health? Patient reported she is still having chronic diarrhea. She has seen GI twice and tried several medicines. She is frustrated that there is no reason or answer.  Have you had any problems with your pharmacy? Patient denied having problems with her current pharmacy.   What issues or side effects are you having with your medications? Patient reported she is still having chronic diarrhea and although Metformin can also cause this she has had this issue before starting the medication.   What would you like me to pass along to Madelin Rear, CPP for them to help you with?  Patient wanted to mention the chronic diarrhea and any suggestion from CPP or PCP.  What can we do to take care of you better? Other than mentioned above patient did not have any other suggestions at this time.   Care Gaps  AWV: done 06/28/20 Colonoscopy: done 10/16/11 DM Eye Exam: overdue 03/02/21 DM Foot Exam: due 06/01/21 Microalbumin: due 04/07/21 HbgAIC: done 10/14/20 (6.3) DEXA: done 07/19/20 Mammogram: due 07/20/21   Star Rating Drugs: Metformin (GLUCOPHAGE) 1000 MG tablet -  last filled 03/19/21 30 days (pt reported) Atorvastatin (LIPITOR) 20 MG tablet - last filled 03/19/21 30 days (pt reported)  Future Appointments  Date Time Provider [place]  04/27/2021  3:00 PM [facility] CCM PHARMACIST [facility] [place], [name] Pharmacist Assistant  [phone]  Time Spent: 30 minutes

## 2021-03-31 ENCOUNTER — Other Ambulatory Visit: Payer: Self-pay | Admitting: Family Medicine

## 2021-04-06 ENCOUNTER — Telehealth: Payer: Self-pay

## 2021-04-06 NOTE — Telephone Encounter (Signed)
The note I read from GI back in April indicates that she has collagenous colitis and was treated w/ Budesonide (a steroid) and this dramatically improved her symptoms.  I would recommend she reach back out to [place] as she is likely due for another appointment and it sounds as if they need to repeat treatment or start something else.

## 2021-04-06 NOTE — Telephone Encounter (Signed)
Called and left message on patient vm to return my call about GI concerns.

## 2021-04-06 NOTE — Telephone Encounter (Signed)
Dr Birdie Riddle, Please advise

## 2021-04-06 NOTE — Telephone Encounter (Signed)
Caller name:Kelsey Tutson   On DPR? :Yes  Call back number:[phone]  Provider they see: Birdie Riddle   Reason for call:Pt is calling she is having Diarrhea gone through numerous test and has send a gastro doctor Dr Myrtice Lauth she has send twice pt has taken medication and nothing is helping. Pt is concerned with crohn's. She is wanting to know what Dr Birdie Riddle would recommend if she can be seen by a different Gastro doctor because nothing seems to be helping.

## 2021-04-11 DIAGNOSIS — Z23 Encounter for immunization: Secondary | ICD-10-CM | POA: Diagnosis not present

## 2021-04-12 ENCOUNTER — Encounter: Payer: Self-pay | Admitting: Physician Assistant

## 2021-04-12 ENCOUNTER — Ambulatory Visit (INDEPENDENT_AMBULATORY_CARE_PROVIDER_SITE_OTHER): Payer: Medicare Other | Admitting: Physician Assistant

## 2021-04-12 VITALS — BP 120/60 | HR 76 | Ht 63.0 in | Wt 158.0 lb

## 2021-04-12 DIAGNOSIS — K52832 Lymphocytic colitis: Secondary | ICD-10-CM

## 2021-04-12 MED ORDER — BUDESONIDE 3 MG PO CPEP: ORAL_CAPSULE | ORAL | 0 refills | Status: DC

## 2021-04-12 MED ORDER — MIRABEGRON ER 25 MG PO TB24
25.0000 mg | ORAL_TABLET | Freq: Every day | ORAL | 1 refills | Status: DC
Start: 2021-04-12 — End: 2021-11-03

## 2021-04-12 NOTE — Patient Instructions (Addendum)
If you are age 81 or older, your body mass index should be between 23-30. Your Body mass index is 27.99 kg/m. If this is out of the aforementioned range listed, please consider follow up with your Primary Care Provider. ________________________________________________________  The [facility] providers would like to encourage you to use [facility] to communicate with providers for non-urgent requests or questions.  Due to long hold times on the telephone, sending your provider a message by [facility] may be a faster and more efficient way to get a response.  Please allow 48 business hours for a response.  Please remember that this is for non-urgent requests.  _______________________________________________________  RESTART Budesonide 3 mg, 2 tablets daily for 8 weeks then decrease to 2 tablets daily.  Stop using Advil/Ibuprofen, use plain Tylenol as directed for pain.  Contact the office in the next month or 2 to scheduled a follow up appointment with Nicoletta Ba, PA-C for January/February.  Thank you for entrusting me with your care and choosing [facility].  Amy Esterwood, PA-C

## 2021-04-14 ENCOUNTER — Encounter: Payer: Self-pay | Admitting: Physician Assistant

## 2021-04-14 NOTE — Progress Notes (Signed)
Subjective:    Patient ID: [name], female    DOB: October 17, 1940, 80 y.o.   MRN: [id number]  HPI [name] is a pleasant 80 year old white female, established with Dr. Ardis Hughs.  She has diagnosis of lymphocytic colitis which was made at the time of colonoscopy done in 2013. She was last seen here in April 2022 by Ellouise Newer, PA-C and at that time was given a 8-week course of tapered budesonide. Patient has history of C. difficile colitis in 2019 and in 2020, she status post cholecystectomy, has history of asthma, GERD, adult onset diabetes mellitus and BPV. She comes in today stating that she has had another flareup of her symptoms over the past few months.  She is not a great historian but says that she does generally feel a lot better when she has been on medication.  She is currently using Imodium on a as needed basis but is generally having 3-5 diarrheal stools per day.  She does not have any associated abdominal pain or cramping.  Appetite has been good and weight has been stable.  She mentions that she is generally very physically active golfing walking etc. On further questioning she has been taking about 4 Advil per day for arthritic symptoms in her knees.  Review of Systems Pertinent positive and negative review of systems were noted in the above HPI section.  All other review of systems was otherwise negative.   Outpatient Encounter Medications as of 04/12/2021  Medication Sig   atorvastatin (LIPITOR) 20 MG tablet TAKE ONE (1) TABLET BY MOUTH EVERY DAY   Biotin 5000 MCG CAPS Take 5,000 mcg by mouth daily.   budesonide (ENTOCORT EC) 3 MG 24 hr capsule Take 3 capsules (9 mg total) by mouth daily for 56 days, THEN 2 capsules (6 mg total) daily.   citalopram (CELEXA) 40 MG tablet TAKE ONE (1) TABLET BY MOUTH EVERY DAY   famotidine (PEPCID) 40 MG tablet TAKE TWO TABLETS BY MOUTH EVERY NIGHT AT BEDTIME   fenofibrate (TRICOR) 145 MG tablet TAKE ONE TABLET BY MOUTH EVERY MORNING AFTER  BREAKFAST   Ferrous Sulfate (IRON PO) Take by mouth once a week.   hydrOXYzine (ATARAX/VISTARIL) 10 MG tablet Take 0.5-1 tablets (5-10 mg total) by mouth at bedtime as needed (for sleep / wheezing).   ibuprofen (ADVIL) 200 MG tablet Take 200 mg by mouth 2 (two) times daily.    LUMIGAN 0.01 % SOLN Place 1 drop into both eyes at bedtime.    magnesium 30 MG tablet Take 30 mg by mouth 2 (two) times daily.   metFORMIN (GLUCOPHAGE) 1000 MG tablet Take 1,000 mg by mouth 2 (two) times daily.   montelukast (SINGULAIR) 10 MG tablet TAKE 1 TABLET BY MOUTH AT BEDTIME   Multiple Vitamins-Minerals (CENTRUM SILVER PO) Take 1 tablet by mouth daily.   [DISCONTINUED] mirabegron ER (MYRBETRIQ) 25 MG TB24 tablet Take 1 tablet (25 mg total) by mouth daily.   mirabegron ER (MYRBETRIQ) 25 MG TB24 tablet Take 1 tablet (25 mg total) by mouth daily.   [DISCONTINUED] budesonide (ENTOCORT EC) 3 MG 24 hr capsule Take 3 capsules (9 mg total) by mouth daily.   [DISCONTINUED] HYDROcodone-acetaminophen (NORCO) 5-325 MG tablet Take 1 tablet by mouth every 6 (six) hours as needed for moderate pain or severe pain.   [DISCONTINUED] methocarbamol (ROBAXIN) 500 MG tablet Take 1 tablet (500 mg total) by mouth every 8 (eight) hours as needed for muscle spasms.   [DISCONTINUED] predniSONE (DELTASONE) 20 MG tablet  Take 1 tablet (20 mg total) by mouth daily with breakfast.   No facility-administered encounter medications on file as of 04/12/2021.   Allergies  Allergen Reactions   Papain Anaphylaxis    Takes an extreme concentration   Papaya Derivatives Anaphylaxis   Mushroom Ext Cmplx(Shiitake-Reishi-Mait) Other (See Comments)    Head congestion and sore throat   Mushroom Extract Complex Other (See Comments)    Head congestion and sore throat after eating 2 days in row   Venlafaxine Other (See Comments)    UNSPECIFIED REACTION  Pt states she can take Name brand. Lethargy   Patient Active Problem List   Diagnosis Date Noted    Other muscle spasm 10/15/2020   Gastro-esophageal reflux disease without esophagitis 10/15/2020   Overweight (BMI 25.0-29.9) 06/01/2020   Thyroid nodule 03/13/2018   Chronic diarrhea 12/26/2017   S/P shoulder replacement, right 09/15/2016   Benign paroxysmal positional vertigo 10/14/2014   Back pain 08/27/2014   Physical exam 08/27/2014   Decreased hearing of both ears 05/19/2014   S/P cholecystectomy 10/02/2013   Asthma, moderate persistent 05/16/2013   H/O recurrent pneumonia 05/16/2013   Insomnia 05/05/2013   Gluten intolerance 05/06/2012   Renal calculus, left 01/29/2012   Painful bladder spasm 12/14/2011   Lactose intolerance 12/14/2011   Post-menopausal 12/14/2011   Microscopic colitis 11/08/2011   Hepatic steatosis 08/25/2011   Pulmonary nodule, right 08/25/2011   Hematuria 08/23/2011   Memory loss 12/12/2010   Diabetes mellitus type II, controlled, with no complications ([place]) 09/73/5329   Hyperlipidemia 07/14/2010   Depression with anxiety 07/14/2010   GLAUCOMA 07/14/2010   ALLERGIC RHINITIS 07/14/2010   Social History   Socioeconomic History   Marital status: Widowed    Spouse name: Not on file   Number of children: 2   Years of education: Not on file   Highest education level: Not on file  Occupational History   Occupation: Retired -Sports coach: RETIRED  Tobacco Use   Smoking status: Former    Packs/day: 0.10    Years: 4.00    Pack years: 0.40    Types: Cigarettes    Quit date: 06/19/1986    Years since quitting: 34.8   Smokeless tobacco: Never   Tobacco comments:    smoked in college years ago  Vaping Use   Vaping Use: Never used  Substance and Sexual Activity   Alcohol use: Yes    Comment: 1-2 glasses week variety beer occ scotch   Drug use: No   Sexual activity: Not Currently    Birth control/protection: Surgical  Other Topics Concern   Not on file  Social History Narrative   Daily caffeine    Social Determinants of Health    Financial Resource Strain: Low Risk    Difficulty of Paying Living Expenses: Not hard at all  Food Insecurity: No Food Insecurity   Worried About Charity fundraiser in the Last Year: Never true   [place] in the Last Year: Never true  Transportation Needs: No Transportation Needs   Lack of Transportation (Medical): No   Lack of Transportation (Non-Medical): No  Physical Activity: Sufficiently Active   Days of Exercise per Week: 3 days   Minutes of Exercise per Session: 50 min  Stress: No Stress Concern Present   Feeling of Stress : Not at all  Social Connections: Moderately Integrated   Frequency of Communication with Friends and Family: More than three times a week   Frequency of  Social Gatherings with Friends and Family: More than three times a week   Attends Religious Services: More than 4 times per year   Active Member of [employer] or Organizations: Yes   Attends Archivist Meetings: 1 to 4 times per year   Marital Status: Widowed  Intimate Partner Violence: Not At Risk   Fear of Current or Ex-Partner: No   Emotionally Abused: No   Physically Abused: No   Sexually Abused: No    [name]'s family history includes Diabetes in her father.      Objective:    Vitals:   04/12/21 1335  BP: 120/60  Pulse: 76    Physical Exam Well-developed well-nourished elderly white female in no acute distress.  Very pleasant, height, Weight, 158 BMI 27.9  HEENT; nontraumatic normocephalic, EOMI, PE R LA, sclera anicteric. Oropharynx; not examined today Neck; supple, no JVD Cardiovascular; regular rate and rhythm with S1-S2, no murmur rub or gallop Pulmonary; Clear bilaterally Abdomen; soft, nontender, nondistended, no palpable mass or hepatosplenomegaly, bowel sounds are active Rectal; not done today Skin; benign exam, no jaundice rash or appreciable lesions Extremities; no clubbing cyanosis or edema skin warm and dry Neuro/Psych; alert and oriented x4, grossly  nonfocal mood and affect appropriate        Assessment & Plan:   #47 80 year old white female with diagnosis of lymphocytic colitis, presenting now with 3 to 17-month history of recurrent diarrhea with 3-5 bowel movements per day, no associated abdominal pain or cramping. Symptoms are consistent with exacerbation of lymphocytic colitis.  On review of her medication she has been taking ibuprofen 4 tablets daily regularly.  NSAIDs are known to be contraindicated with lymphocytic colitis, and known medication culprit. She is not on any other medications known to be offending drugs.  #2 adult onset diabetes mellitus 3.  Status post remote cholecystectomy 4.  Asthma 5.  Prior history of C. difficile colitis 2019 and 2020  Plan; Restart budesonide 3 mg tablets, she is to take 9 mg once daily x8 weeks, then decrease to 6 mg daily.  I suspect she will need a much slower taper and may actually benefit from maintenance dosing with low-dose budesonide at either 3 mg or 6 mg.  Patient was advised to stop all NSAID use and use Tylenol instead.  We will plan to see her back in follow-up in early January, and can determine at that time if there is need for low-dose maintenance dosing. Patient knows to call in the interim if she does not have significant provement on the above regimen.  Plan;  Alfredia Ferguson PA-C 04/14/2021   Cc: Midge Minium, MD

## 2021-04-15 NOTE — Progress Notes (Signed)
I agree with the above note, plan 

## 2021-04-19 DIAGNOSIS — H43813 Vitreous degeneration, bilateral: Secondary | ICD-10-CM | POA: Diagnosis not present

## 2021-04-19 DIAGNOSIS — H35373 Puckering of macula, bilateral: Secondary | ICD-10-CM | POA: Diagnosis not present

## 2021-04-19 DIAGNOSIS — E119 Type 2 diabetes mellitus without complications: Secondary | ICD-10-CM | POA: Diagnosis not present

## 2021-04-19 DIAGNOSIS — H401131 Primary open-angle glaucoma, bilateral, mild stage: Secondary | ICD-10-CM | POA: Diagnosis not present

## 2021-04-19 DIAGNOSIS — Z961 Presence of intraocular lens: Secondary | ICD-10-CM | POA: Diagnosis not present

## 2021-04-19 LAB — HM DIABETES EYE EXAM

## 2021-04-27 ENCOUNTER — Telehealth: Payer: Self-pay

## 2021-04-27 ENCOUNTER — Telehealth: Payer: Medicare Other

## 2021-04-27 ENCOUNTER — Ambulatory Visit: Payer: Medicare Other | Admitting: Gastroenterology

## 2021-04-27 NOTE — Progress Notes (Deleted)
Chronic Care Management Pharmacy Note  04/27/2021 Name:  [name] MRN:  [id number] DOB:  [birth year]  Summary:  Recommendations/Changes made from today's visit:  Plan:  Subjective: [name] is an 80 y.o. year old female who is a primary patient of Tabori, Karen Millet, MD.  The CCM team was consulted for assistance with disease management and care coordination needs.    {CCMTELEPHONEFACETOFACE:21091510} for {CCMINITIALFOLLOWUPCHOICE:21091511} in response to provider referral for pharmacy case management and/or care coordination services.   Consent to Services:  {CCMCONSENTOPTIONS:25074}  Patient Care Team: Karen Minium, MD as PCP - General (Family Medicine) [name], AUD (Audiology) Karen Cedars, MD as Consulting Physician (Orthopedic Surgery) Hollar, Karen Look, MD as Referring Physician (Dermatology) Karen Banister, MD as Attending Physician (Gastroenterology) Karen Dalton, MD as Referring Physician (Pulmonary Disease) Karen Cantor, MD as Consulting Physician (Ophthalmology) Karen Peek, MD (General Practice) Karen Greenhouse, MD as Referring Physician (Endocrinology) Karen Rhodes, MD (Inactive) as Consulting Physician (Urology) [name], [facility] as Pharmacist (Pharmacist)  Recent office visits: ***  Recent consult visits: [facility] visits: {Hospital DC Yes/No:21091515}  Objective:  Lab Results  Component Value Date   CREATININE 0.89 10/14/2020   CREATININE 0.89 06/01/2020   CREATININE 0.79 05/20/2019    Lab Results  Component Value Date   HGBA1C 6.3 (H) 10/14/2020   Last diabetic Eye exam:  Lab Results  Component Value Date/Time   HMDIABEYEEXA No Retinopathy 07/16/2018 12:00 AM    Last diabetic Foot exam: No results found for: HMDIABFOOTEX      Component Value Date/Time   CHOL 117 06/01/2020 1037   TRIG 130.0 06/01/2020 1037   HDL 39.20 06/01/2020 1037   CHOLHDL 3 06/01/2020 1037   VLDL 26.0  06/01/2020 1037   LDLCALC 52 06/01/2020 1037   LDLCALC 70 08/15/2017 1533   LDLDIRECT 131.0 02/29/2016 1055    Hepatic Function Latest Ref Rng & Units 06/01/2020 05/20/2019 02/03/2019  Total Protein 6.0 - 8.3 g/dL 7.0 6.9 6.9  Albumin 3.5 - 5.2 g/dL 4.5 4.6 4.6  AST 0 - 37 U/L '17 20 18  ' ALT 0 - 35 U/L '14 21 15  ' Alk Phosphatase 39 - 117 U/L 41 45 40  Total Bilirubin 0.2 - 1.2 mg/dL 0.4 0.4 0.3  Bilirubin, Direct 0.0 - 0.3 mg/dL 0.1 0.1 -    Lab Results  Component Value Date/Time   TSH 2.25 06/01/2020 10:37 AM   TSH 2.14 05/20/2019 11:25 AM    CBC Latest Ref Rng & Units 10/14/2020 06/01/2020 05/20/2019  WBC 4.0 - 10.5 K/uL 7.9 7.6 7.4  Hemoglobin 12.0 - 15.0 g/dL 12.9 13.0 12.8  Hematocrit 36.0 - 46.0 % 39.7 39.4 38.4  Platelets 150 - 400 K/uL 309 271.0 302.0    No results found for: VD25OH  Clinical ASCVD: {YES/NO:21197} The ASCVD Risk score (Arnett DK, et al., 2019) failed to calculate for the following reasons:   The 2019 ASCVD risk score is only valid for ages 56 to 21    Other: (CHADS2VASc if Afib, PHQ9 if depression, MMRC or CAT for COPD, ACT, DEXA)  Social History   Tobacco Use  Smoking Status Former   Packs/day: 0.10   Years: 4.00   Pack years: 0.40   Types: Cigarettes   Quit date: 06/19/1986   Years since quitting: 34.8  Smokeless Tobacco Never  Tobacco Comments   smoked in college years ago   BP Readings from Last 3 Encounters:  04/12/21 120/60  12/08/20 120/70  10/15/20 132/69   Pulse Readings from Last 3 Encounters:  04/12/21 76  12/08/20 80  10/15/20 80   Wt Readings from Last 3 Encounters:  04/12/21 158 lb (71.7 kg)  12/08/20 160 lb (72.6 kg)  10/15/20 158 lb 9.6 oz (71.9 kg)    Assessment: Review of patient past medical history, allergies, medications, health status, including review of consultants reports, laboratory and other test data, was performed as part of comprehensive evaluation and provision of chronic care management services.    SDOH:  (Social Determinants of Health) assessments and interventions performed:    CCM Care Plan  Allergies  Allergen Reactions   Papain Anaphylaxis    Takes an extreme concentration   Papaya Derivatives Anaphylaxis   Mushroom Ext Cmplx(Shiitake-Reishi-Mait) Other (See Comments)    Head congestion and sore throat   Mushroom Extract Complex Other (See Comments)    Head congestion and sore throat after eating 2 days in row   Venlafaxine Other (See Comments)    UNSPECIFIED REACTION  Pt states she can take Name brand. Lethargy    Medications Reviewed Today     Reviewed by Leotis Pain (Physician Assistant Certified) on 88/41/66 at 1753  Med List Status: <None>   Medication Order Taking? Sig Documenting Provider Last Dose Status Informant  atorvastatin (LIPITOR) 20 MG tablet [id number] Yes TAKE ONE (1) TABLET BY MOUTH EVERY DAY Karen Minium, MD Taking Active   Biotin 5000 MCG CAPS [id number] Yes Take 5,000 mcg by mouth daily. [provider] Taking Active Self  budesonide (ENTOCORT EC) 3 MG 24 hr capsule [id number] Yes Take 3 capsules (9 mg total) by mouth daily for 56 days, THEN 2 capsules (6 mg total) daily. Esterwood, Amy S, PA-C  Active   citalopram (CELEXA) 40 MG tablet [id number] Yes TAKE ONE (1) TABLET BY MOUTH EVERY DAY Karen Minium, MD Taking Active   famotidine (PEPCID) 40 MG tablet [id number] Yes TAKE TWO TABLETS BY MOUTH EVERY NIGHT AT BEDTIME Karen Minium, MD Taking Active   fenofibrate (TRICOR) 145 MG tablet [id number] Yes TAKE ONE TABLET BY MOUTH EVERY MORNING AFTER BREAKFAST Karen Minium, MD Taking Active   Ferrous Sulfate (IRON PO) [id number] Yes Take by mouth once a week. [provider] Taking Active Self  hydrOXYzine (ATARAX/VISTARIL) 10 MG tablet [id number] Yes Take 0.5-1 tablets (5-10 mg total) by mouth at bedtime as needed (for sleep / wheezing). Maximiano Coss, NP Taking Active   ibuprofen (ADVIL) 200 MG tablet  [id number] Yes Take 200 mg by mouth 2 (two) times daily.  [provider] Taking Active Self  LUMIGAN 0.01 % SOLN [id number] Yes Place 1 drop into both eyes at bedtime.  [provider] Taking Active Self           Med Note [name], [name]   Wed Oct 13, 2020  1:18 PM)    magnesium 30 MG tablet [id number] Yes Take 30 mg by mouth 2 (two) times daily. [provider] Taking Active Self  metFORMIN (GLUCOPHAGE) 1000 MG tablet [id number] Yes Take 1,000 mg by mouth 2 (two) times daily. [provider] Taking Active Self  mirabegron ER (MYRBETRIQ) 25 MG TB24 tablet [id number]  Take 1 tablet (25 mg total) by mouth daily. Esterwood, Amy S, PA-C  Active   montelukast (SINGULAIR) 10 MG tablet [id number] Yes TAKE 1 TABLET BY MOUTH AT BEDTIME Karen Minium, MD Taking Active   Multiple Vitamins-Minerals (CENTRUM SILVER PO) [id number] Yes Take 1 tablet by  mouth daily. [provider] Taking Active Self            Patient Active Problem List   Diagnosis Date Noted   Other muscle spasm 10/15/2020   Gastro-esophageal reflux disease without esophagitis 10/15/2020   Overweight (BMI 25.0-29.9) 06/01/2020   Thyroid nodule 03/13/2018   Chronic diarrhea 12/26/2017   S/P shoulder replacement, right 09/15/2016   Benign paroxysmal positional vertigo 10/14/2014   Back pain 08/27/2014   Physical exam 08/27/2014   Decreased hearing of both ears 05/19/2014   S/P cholecystectomy 10/02/2013   Asthma, moderate persistent 05/16/2013   H/O recurrent pneumonia 05/16/2013   Insomnia 05/05/2013   Gluten intolerance 05/06/2012   Renal calculus, left 01/29/2012   Painful bladder spasm 12/14/2011   Lactose intolerance 12/14/2011   Post-menopausal 12/14/2011   Microscopic colitis 11/08/2011   Hepatic steatosis 08/25/2011   Pulmonary nodule, right 08/25/2011   Hematuria 08/23/2011   Memory loss 12/12/2010   Diabetes mellitus type II, controlled, with no complications ([place])  16/03/9603   Hyperlipidemia 07/14/2010   Depression with anxiety 07/14/2010   GLAUCOMA 07/14/2010   ALLERGIC RHINITIS 07/14/2010    Immunization History  Administered Date(s) Administered   Influenza Split 04/11/2011   Influenza, High Dose Seasonal PF 02/21/2019, 03/19/2020   Influenza,inj,Quad PF,6+ Mos 04/10/2013, 03/10/2014, 04/20/2015, 02/29/2016, 03/20/2017, 02/06/2018   Moderna Sars-Covid-2 Vaccination 07/29/2019, 08/12/2019   Pneumococcal Conjugate-13 08/27/2014   Pneumococcal Polysaccharide-23 04/19/2010   Tdap 04/20/2012   Zoster Recombinat (Shingrix) 04/15/2018, 07/15/2018    Conditions to be addressed/monitored: {CCM ASSESSMENT DISEASE OPTIONS:25047}  There are no care plans that you recently modified to display for this patient.   Medication Assistance: {MEDASSISTANCEINFO:25044}  Patient's preferred pharmacy is:  [place], [place] - [address] [place] [address] Phone: [phone] Fax: [phone]  [place], [place] [address]. Melina Modena [address] Janann Colonel [place] [address] Phone: [phone] Fax: [phone]  Uses pill box? {Yes or If no, why not?:20788} Pt endorses ***% compliance  Follow Up:  {FOLLOWUP:24991}  Plan: {CM FOLLOW UP PLAN:25073}  SIG***

## 2021-04-27 NOTE — Telephone Encounter (Signed)
  Care Management   Follow Up Note   04/27/2021 Name: [name] MRN: [id number] DOB: 03-Jul-1940   Referred by: Midge Minium, MD Reason for referral : No chief complaint on file.   An unsuccessful telephone outreach was attempted today. The patient was referred to the case management team for assistance with care management and care coordination.   Follow Up Plan:  if pt returns call, please provide w/ pharmacist assistant phone number Learta Codding [phone]) so patient can reschedule  Madelin Rear, PharmD, CPP Clinical Pharmacist Practitioner  [facility]  [phone]

## 2021-05-26 DIAGNOSIS — Z20822 Contact with and (suspected) exposure to covid-19: Secondary | ICD-10-CM | POA: Diagnosis not present

## 2021-05-27 ENCOUNTER — Telehealth: Payer: Medicare Other | Admitting: Registered Nurse

## 2021-06-01 ENCOUNTER — Telehealth: Payer: Self-pay

## 2021-06-01 ENCOUNTER — Ambulatory Visit (INDEPENDENT_AMBULATORY_CARE_PROVIDER_SITE_OTHER)
Admission: RE | Admit: 2021-06-01 | Discharge: 2021-06-01 | Disposition: A | Discharge status: Home / Self Care | Payer: Medicare Other | Source: Ambulatory Visit | Attending: Family Medicine | Admitting: Family Medicine

## 2021-06-01 ENCOUNTER — Other Ambulatory Visit: Payer: Self-pay

## 2021-06-01 ENCOUNTER — Ambulatory Visit (INDEPENDENT_AMBULATORY_CARE_PROVIDER_SITE_OTHER): Payer: Medicare Other | Admitting: Family Medicine

## 2021-06-01 ENCOUNTER — Encounter: Payer: Self-pay | Admitting: Family Medicine

## 2021-06-01 VITALS — BP 126/60 | HR 73 | Temp 98.0°F | Resp 17 | Ht 63.0 in | Wt 160.2 lb

## 2021-06-01 DIAGNOSIS — M25552 Pain in left hip: Secondary | ICD-10-CM

## 2021-06-01 DIAGNOSIS — M1612 Unilateral primary osteoarthritis, left hip: Secondary | ICD-10-CM

## 2021-06-01 MED ORDER — MELOXICAM 7.5 MG PO TABS: 7.5000 mg | ORAL_TABLET | Freq: Every day | ORAL | 0 refills | Status: DC

## 2021-06-01 NOTE — Progress Notes (Signed)
Subjective:  Patient ID: [name], female    DOB: 1940-06-21  Age: 80 y.o. MRN: [id number]  CC:  Chief Complaint  Patient presents with   Muscle Pain    Pt has been working on increasing distance during walking, reports pain in her Lt leg and groin area, happened apx 10 days ago    HPI [name] presents for   Left leg/groin pain Notices in fold  2.5 mile walk 3 times per week in gym, past 1.5 years. No recent change in activity/exercise. No fall. Pain started abruptly after walking 10 days ago. Few minutes after walking - pain with weight bearing. No redness, swelling or bruising,   Initially seemed to be getting better 4 days ago, but still painful and now wearing L knee brace d/t some knee pain with change in gait.  Worse with weightbearing, and with leg outward.  No prior osteoporosis, hip fracture, surgery, injection, or pain.  BMD in April - Tscore -2.0 on R, -1.9 on left.   Tx: biofreeze, jacuzzi. Ibuprofen 400mg  BID - helping  Ortho - Dr. Veverly Fells at [facility]   History Patient Active Problem List   Diagnosis Date Noted   Other muscle spasm 10/15/2020   Gastro-esophageal reflux disease without esophagitis 10/15/2020   Overweight (BMI 25.0-29.9) 06/01/2020   Thyroid nodule 03/13/2018   Chronic diarrhea 12/26/2017   S/P shoulder replacement, right 09/15/2016   Benign paroxysmal positional vertigo 10/14/2014   Back pain 08/27/2014   Physical exam 08/27/2014   Decreased hearing of both ears 05/19/2014   S/P cholecystectomy 10/02/2013   Asthma, moderate persistent 05/16/2013   H/O recurrent pneumonia 05/16/2013   Insomnia 05/05/2013   Gluten intolerance 05/06/2012   Renal calculus, left 01/29/2012   Painful bladder spasm 12/14/2011   Lactose intolerance 12/14/2011   Post-menopausal 12/14/2011   Microscopic colitis 11/08/2011   Hepatic steatosis 08/25/2011   Pulmonary nodule, right 08/25/2011   Hematuria 08/23/2011   Memory loss 12/12/2010   Diabetes  mellitus type II, controlled, with no complications ([place]) 76/73/4193   Hyperlipidemia 07/14/2010   Depression with anxiety 07/14/2010   GLAUCOMA 07/14/2010   ALLERGIC RHINITIS 07/14/2010   Past Medical History:  Diagnosis Date   Allergic rhinitis    Asthma    Diabetes mellitus    just changed from injection to metformin ? month   Dysrhythmia    palpitations   GERD (gastroesophageal reflux disease)    Glaucoma    History of kidney stones    Hyperlipemia    Migraines    Palpitations    Pneumonia    hx   Urinary tract infection    Past Surgical History:  Procedure Laterality Date   ABDOMINAL HYSTERECTOMY     Has ovaries   BACK SURGERY  1987   lumb   BREAST SURGERY Left 07/2016   bx- needle in breast   CHOLECYSTECTOMY  09/26/13   CLOSED REDUCTION NASAL FRACTURE  04/29/2012   Procedure: CLOSED REDUCTION NASAL FRACTURE;  Surgeon: Jodi Marble, MD;  Location: [facility];  Service: ENT;  Laterality: N/A;  WITH STABILIZATION   COLONOSCOPY     Deviated septum repair     EYE SURGERY Bilateral    cataracts   FOOT SURGERY Bilateral    ingrown toe nails   LITHOTRIPSY     REVERSE SHOULDER ARTHROPLASTY Right 09/15/2016   Procedure: RIGHT REVERSE SHOULDER ARTHROPLASTY;  Surgeon: Netta Cedars, MD;  Location: [facility];  Service: Orthopedics;  Laterality: Right;  REVERSE SHOULDER ARTHROPLASTY Left 10/15/2020   Procedure: REVERSE SHOULDER ARTHROPLASTY;  Surgeon: Netta Cedars, MD;  Location: [facility];  Service: Orthopedics;  Laterality: Left;   ROTATOR CUFF REPAIR     right   TONSILLECTOMY     Allergies  Allergen Reactions   Papain Anaphylaxis    Takes an extreme concentration   Papaya Derivatives Anaphylaxis   Mushroom Ext Cmplx(Shiitake-Reishi-Mait) Other (See Comments)    Head congestion and sore throat   Mushroom Extract Complex Other (See Comments)    Head congestion and sore throat after eating 2 days in row   Venlafaxine Other (See Comments)    UNSPECIFIED  REACTION  Pt states she can take Name brand. Lethargy   Prior to Admission medications   Medication Sig Start Date End Date Taking? Authorizing Provider  atorvastatin (LIPITOR) 20 MG tablet TAKE ONE (1) TABLET BY MOUTH EVERY DAY 11/19/20  Yes Midge Minium, MD  Biotin 5000 MCG CAPS Take 5,000 mcg by mouth daily.   Yes [provider]  budesonide (ENTOCORT EC) 3 MG 24 hr capsule Take 3 capsules (9 mg total) by mouth daily for 56 days, THEN 2 capsules (6 mg total) daily. 04/12/21 07/13/21 Yes Esterwood, Amy S, PA-C  citalopram (CELEXA) 40 MG tablet TAKE ONE (1) TABLET BY MOUTH EVERY DAY 02/17/21  Yes Midge Minium, MD  famotidine (PEPCID) 40 MG tablet TAKE TWO TABLETS BY MOUTH EVERY NIGHT AT BEDTIME 10/18/20  Yes Midge Minium, MD  fenofibrate (TRICOR) 145 MG tablet TAKE ONE TABLET BY MOUTH EVERY MORNING AFTER BREAKFAST 11/19/20  Yes Midge Minium, MD  Ferrous Sulfate (IRON PO) Take by mouth once a week.   Yes [provider]  hydrOXYzine (ATARAX/VISTARIL) 10 MG tablet Take 0.5-1 tablets (5-10 mg total) by mouth at bedtime as needed (for sleep / wheezing). 01/25/21  Yes Maximiano Coss, NP  ibuprofen (ADVIL) 200 MG tablet Take 200 mg by mouth 2 (two) times daily.    Yes [provider]  LUMIGAN 0.01 % SOLN Place 1 drop into both eyes at bedtime.  09/07/14  Yes [provider]  magnesium 30 MG tablet Take 30 mg by mouth 2 (two) times daily.   Yes [provider]  metFORMIN (GLUCOPHAGE) 1000 MG tablet Take 1,000 mg by mouth 2 (two) times daily. 09/23/20  Yes [provider]  mirabegron ER (MYRBETRIQ) 25 MG TB24 tablet Take 1 tablet (25 mg total) by mouth daily. 04/12/21  Yes Esterwood, Amy S, PA-C  montelukast (SINGULAIR) 10 MG tablet TAKE 1 TABLET BY MOUTH AT BEDTIME 03/31/21  Yes Midge Minium, MD  Multiple Vitamins-Minerals (CENTRUM SILVER PO) Take 1 tablet by mouth daily.   Yes [provider]   Social History    Socioeconomic History   Marital status: Widowed    Spouse name: Not on file   Number of children: 2   Years of education: Not on file   Highest education level: Not on file  Occupational History   Occupation: Retired -Sports coach: RETIRED  Tobacco Use   Smoking status: Former    Packs/day: 0.10    Years: 4.00    Pack years: 0.40    Types: Cigarettes    Quit date: 06/19/1986    Years since quitting: 34.9   Smokeless tobacco: Never   Tobacco comments:    smoked in college years ago  Vaping Use   Vaping Use: Never used  Substance and Sexual Activity  Alcohol use: Yes    Comment: 1-2 glasses week variety beer occ scotch   Drug use: No   Sexual activity: Not Currently    Birth control/protection: Surgical  Other Topics Concern   Not on file  Social History Narrative   Daily caffeine    Social Determinants of Health   Financial Resource Strain: Not on file  Food Insecurity: Not on file  Transportation Needs: Not on file  Physical Activity: Sufficiently Active   Days of Exercise per Week: 3 days   Minutes of Exercise per Session: 50 min  Stress: No Stress Concern Present   Feeling of Stress : Not at all  Social Connections: Moderately Integrated   Frequency of Communication with Friends and Family: More than three times a week   Frequency of Social Gatherings with Friends and Family: More than three times a week   Attends Religious Services: More than 4 times per year   Active Member of [employer] or Organizations: Yes   Attends Archivist Meetings: 1 to 4 times per year   Marital Status: Widowed  Human resources officer Violence: Not At Risk   Fear of Current or Ex-Partner: No   Emotionally Abused: No   Physically Abused: No   Sexually Abused: No    Review of Systems Per HPI.   Objective:   Vitals:   06/01/21 1320  BP: 126/60  Pulse: 73  Resp: 17  Temp: 98 F (36.7 C)  TempSrc: Temporal  SpO2: 96%  Weight: 160 lb 3.2 oz (72.7 kg)  Height:  5\' 3"  (1.6 m)     Physical Exam Vitals reviewed.  Constitutional:      General: She is not in acute distress.    Appearance: Normal appearance. She is well-developed.  HENT:     Head: Normocephalic and atraumatic.  Cardiovascular:     Rate and Rhythm: Normal rate.  Pulmonary:     Effort: Pulmonary effort is normal.  Musculoskeletal:     Comments: Left groin, skin intact, no erythema or ecchymosis.  No bulge, no swelling with Valsalva or cough.  Exam with Assistant Noah Delaine present.  Anterior groin pain with internal and external rotation at terminal range of motion.  Minimal discomfort with flexion, extension.  Neurovascular intact distally.  Left knee without focal bony tenderness, pain-free range of motion.  Lumbar spine nontender.Negative seated straight leg raise.    Skin:    General: Skin is warm and dry.     Findings: No rash.  Neurological:     Mental Status: She is alert and oriented to person, place, and time.  Psychiatric:        Mood and Affect: Mood normal.    DG HIP UNILAT W OR W/O PELVIS 2-3 VIEWS LEFT  Result Date: 06/01/2021 CLINICAL DATA:  Left hip pain. EXAM: DG HIP (WITH OR WITHOUT PELVIS) 2-3V LEFT COMPARISON:  None. FINDINGS: Both hips are normally located. Mild to moderate age related degenerative changes but no fracture or plain film evidence of avascular necrosis. The pubic symphysis and SI joints are intact. No pelvic fractures or bone lesions. Rounded well corticated density near the greater trochanter likely an old avulsion injury or unfused ossification center. IMPRESSION: Mild to moderate age related degenerative changes but no acute bony findings. Electronically Signed   By: Marijo Sanes M.D.   On: 06/01/2021 15:37    39 minutes spent during visit, including chart review, counseling and assimilation of information, exam, discussion of plan, review of imaging results and  chart completion.    Assessment & Plan:  [name] is a 80 y.o. female  . Left hip pain - Plan: DG HIP UNILAT W OR W/O PELVIS 2-3 VIEWS LEFT, meloxicam (MOBIC) 7.5 MG tablet  -Acute onset of left hip pain after her usual exercise regimen without recent changes in exercise regimen.  No known injury.  Some initial improvement.  Persistent discomfort.  Imaging reassuring without fracture.  Possible flare of degenerative joint disease versus labral versus soft tissue issue.  Trial of meloxicam 7.5 mg daily in place of over-the-counter ibuprofen, Tylenol as needed and will refer to orthopedics.  Assistive device discussed with cane or walker if needed if pain with weightbearing.  Meds ordered this encounter  Medications   meloxicam (MOBIC) 7.5 MG tablet    Sig: Take 1 tablet (7.5 mg total) by mouth daily.    Dispense:  30 tablet    Refill:  0   Patient Instructions  Please have xray performed at [facility] once per day for now - stop ibuprofen. Stop meloxicam if any stomach upset or blood in stools. Tylenol as needed. Try to use cane or walker for assistance for now. Depending on xray results can refer to orthopaedist as we discussed. I will let you know the results in next 2 days. Thanks for coming in today.     Signed,   Merri Ray, MD [facility] 06/01/21 6:50 PM

## 2021-06-01 NOTE — Patient Instructions (Addendum)
Please have xray performed at [facility] once per day for now - stop ibuprofen. Stop meloxicam if any stomach upset or blood in stools. Tylenol as needed. Try to use cane or walker for assistance for now. Depending on xray results can refer to orthopaedist as we discussed. I will let you know the results in next 2 days. Thanks for coming in today.

## 2021-06-02 ENCOUNTER — Encounter: Payer: Self-pay | Admitting: Family Medicine

## 2021-06-02 NOTE — Telephone Encounter (Signed)
Pt reports increased pain avs states ortho if unresolved. Imaging is clear per note.  Please advise next appropriate action

## 2021-06-03 DIAGNOSIS — M25552 Pain in left hip: Secondary | ICD-10-CM | POA: Insufficient documentation

## 2021-06-03 NOTE — Telephone Encounter (Signed)
Error

## 2021-06-06 DIAGNOSIS — M25552 Pain in left hip: Secondary | ICD-10-CM | POA: Diagnosis not present

## 2021-06-15 DIAGNOSIS — M25552 Pain in left hip: Secondary | ICD-10-CM | POA: Diagnosis not present

## 2021-06-20 ENCOUNTER — Other Ambulatory Visit: Payer: Self-pay | Admitting: Physician Assistant

## 2021-06-23 ENCOUNTER — Other Ambulatory Visit: Payer: Self-pay | Admitting: Family Medicine

## 2021-07-05 ENCOUNTER — Ambulatory Visit (INDEPENDENT_AMBULATORY_CARE_PROVIDER_SITE_OTHER): Payer: Medicare Other | Admitting: Family Medicine

## 2021-07-05 ENCOUNTER — Encounter: Payer: Self-pay | Admitting: Family Medicine

## 2021-07-05 VITALS — BP 126/72 | HR 74 | Temp 97.9°F | Resp 17 | Ht 63.0 in | Wt 157.0 lb

## 2021-07-05 DIAGNOSIS — E119 Type 2 diabetes mellitus without complications: Secondary | ICD-10-CM

## 2021-07-05 DIAGNOSIS — E782 Mixed hyperlipidemia: Secondary | ICD-10-CM

## 2021-07-05 DIAGNOSIS — E663 Overweight: Secondary | ICD-10-CM | POA: Diagnosis not present

## 2021-07-05 LAB — BASIC METABOLIC PANEL
BUN: 29 mg/dL — ABNORMAL HIGH (ref 6–23)
CO2: 25 mEq/L (ref 19–32)
Calcium: 10.1 mg/dL (ref 8.4–10.5)
Chloride: 103 mEq/L (ref 96–112)
Creatinine, Ser: 0.89 mg/dL (ref 0.40–1.20)
GFR: 60.68 mL/min (ref >60.00)
Glucose, Bld: 68 mg/dL — ABNORMAL LOW (ref 70–99)
Potassium: 4.4 mEq/L (ref 3.5–5.1)
Sodium: 137 mEq/L (ref 135–145)

## 2021-07-05 LAB — MICROALBUMIN / CREATININE URINE RATIO
Creatinine,U: 76.8 mg/dL
Microalb Creat Ratio: 0.9 mg/g (ref 0.0–30.0)
Microalb, Ur: <0.7 mg/dL (ref 0.0–1.9)

## 2021-07-05 LAB — CBC WITH DIFFERENTIAL/PLATELET
Basophils Absolute: 0.1 10*3/uL (ref 0.0–0.1)
Basophils Relative: 0.9 % (ref 0.0–3.0)
Eosinophils Absolute: 0.2 10*3/uL (ref 0.0–0.7)
Eosinophils Relative: 2.2 % (ref 0.0–5.0)
HCT: 41.3 % (ref 36.0–46.0)
Hemoglobin: 13.3 g/dL (ref 12.0–15.0)
Lymphocytes Relative: 30.6 % (ref 12.0–46.0)
Lymphs Abs: 2.3 10*3/uL (ref 0.7–4.0)
MCHC: 32.3 g/dL (ref 30.0–36.0)
MCV: 89.2 fl (ref 78.0–100.0)
Monocytes Absolute: 0.5 10*3/uL (ref 0.1–1.0)
Monocytes Relative: 6.9 % (ref 3.0–12.0)
Neutro Abs: 4.5 10*3/uL (ref 1.4–7.7)
Neutrophils Relative %: 59.4 % (ref 43.0–77.0)
Platelets: 254 10*3/uL (ref 150.0–400.0)
RBC: 4.63 Mil/uL (ref 3.87–5.11)
RDW: 14.3 % (ref 11.5–15.5)
WBC: 7.6 10*3/uL (ref 4.0–10.5)

## 2021-07-05 LAB — LIPID PANEL
Cholesterol: 144 mg/dL (ref 0–200)
HDL: 53 mg/dL (ref >39.00)
LDL Cholesterol: 73 mg/dL (ref 0–99)
NonHDL: 91.45
Total CHOL/HDL Ratio: 3
Triglycerides: 91 mg/dL (ref 0.0–149.0)
VLDL: 18.2 mg/dL (ref 0.0–40.0)

## 2021-07-05 LAB — HEPATIC FUNCTION PANEL
ALT: 20 U/L (ref 0–35)
AST: 21 U/L (ref 0–37)
Albumin: 4.6 g/dL (ref 3.5–5.2)
Alkaline Phosphatase: 42 U/L (ref 39–117)
Bilirubin, Direct: 0.1 mg/dL (ref 0.0–0.3)
Total Bilirubin: 0.4 mg/dL (ref 0.2–1.2)
Total Protein: 7 g/dL (ref 6.0–8.3)

## 2021-07-05 LAB — TSH: TSH: 2.67 u[IU]/mL (ref 0.35–5.50)

## 2021-07-05 NOTE — Progress Notes (Signed)
° °  Subjective:    Patient ID: [name], female    DOB: [birth year], 81 y.o.   MRN: [id number]  HPI Hyperlipidemia- chronic problem, on Atorvastatin 20mg  daily, Fenofibrate 145mg  daily.  Last LDL 52.  Pt would like to stop Atorvastatin.  Denies CP, SOB, abd pain, N/V.  DM- chronic problem, last A1C 6.6% on 03/08/21.  Follows w/ [facility].  Foot exam done today.  UTD on eye exam.  Due for microalbumin.  Overweight- pt was limited in her walking this fall due to hip injury.  She has resumed her walking and is up to 7-8 miles/week.  Pt is down 3 lbs since last visit.  BMI now 27.81   Review of Systems For ROS see HPI   This visit occurred during the SARS-CoV-2 public health emergency.  Safety protocols were in place, including screening questions prior to the visit, additional usage of staff PPE, and extensive cleaning of exam room while observing appropriate contact time as indicated for disinfecting solutions.      Objective:   Physical Exam Vitals reviewed.  Constitutional:      General: She is not in acute distress.    Appearance: Normal appearance. She is well-developed. She is not ill-appearing.  HENT:     Head: Normocephalic and atraumatic.  Eyes:     Conjunctiva/sclera: Conjunctivae normal.     Pupils: Pupils are equal, round, and reactive to light.  Neck:     Thyroid: Thyroid mass (3 cm, firm at supraclavicular notch) present.  Cardiovascular:     Rate and Rhythm: Normal rate and regular rhythm.     Pulses: Normal pulses.     Heart sounds: Normal heart sounds. No murmur heard. Pulmonary:     Effort: Pulmonary effort is normal. No respiratory distress.     Breath sounds: Normal breath sounds.  Abdominal:     General: There is no distension.     Palpations: Abdomen is soft.     Tenderness: There is no abdominal tenderness.  Musculoskeletal:     Cervical back: Normal range of motion and neck supple.     Right lower leg: No edema.     Left lower leg: No edema.   Lymphadenopathy:     Cervical: No cervical adenopathy.  Skin:    General: Skin is warm and dry.  Neurological:     General: No focal deficit present.     Mental Status: She is alert and oriented to person, place, and time.  Psychiatric:        Mood and Affect: Mood normal.        Behavior: Behavior normal.          Assessment & Plan:

## 2021-07-05 NOTE — Assessment & Plan Note (Signed)
Pt's BMI is 27.81  She really wants to commit to healthy diet and regular exercise to try and come off both Metformin and Atorvastatin.  Encouraged her goals and will follow.

## 2021-07-05 NOTE — Assessment & Plan Note (Signed)
Following w/ Endo.  Foot exam done today and microalbumin ordered.

## 2021-07-05 NOTE — Patient Instructions (Signed)
Follow up in 6 months to recheck cholesterol We'll notify you of your lab results and make any changes if needed Keep up the good work on healthy diet and regular exercise- you look great!!! Call with any questions or concerns Stay Safe!  Stay Healthy! Happy New Year!! 

## 2021-07-05 NOTE — Assessment & Plan Note (Signed)
Chronic problem.  On Atorvastatin 20mg  daily and Fenofibrate 145mg .  She would like to come off medication if possible.  Last LDL 52 and at goal.  If labs still look good will try a statin holiday and see if labs still remain at goal.  Pt expressed understanding and is in agreement w/ plan.

## 2021-07-06 ENCOUNTER — Encounter: Payer: Self-pay | Admitting: Family Medicine

## 2021-07-07 ENCOUNTER — Telehealth: Payer: Self-pay

## 2021-07-07 NOTE — Telephone Encounter (Signed)
-----   Message from Midge Minium, MD sent at 07/06/2021  7:13 AM EST ----- Labs look great!  Keep up the good work!

## 2021-07-07 NOTE — Telephone Encounter (Signed)
Patient is aware of labs °

## 2021-07-21 DIAGNOSIS — Z1231 Encounter for screening mammogram for malignant neoplasm of breast: Secondary | ICD-10-CM | POA: Diagnosis not present

## 2021-07-21 LAB — HM MAMMOGRAPHY

## 2021-07-26 ENCOUNTER — Encounter: Payer: Self-pay | Admitting: Family Medicine

## 2021-08-01 ENCOUNTER — Telehealth: Payer: Self-pay | Admitting: Family Medicine

## 2021-08-01 NOTE — Telephone Encounter (Signed)
Pt will need an appointment to have discussion about weight loss medications

## 2021-08-01 NOTE — Telephone Encounter (Signed)
Appt made Wed at 2:00

## 2021-08-01 NOTE — Telephone Encounter (Signed)
Pt called in asking if [name] would be willing to prescribe her a wt loss medication. Pt states that she wanted something that is not harmful.   Please advise

## 2021-08-02 ENCOUNTER — Ambulatory Visit (INDEPENDENT_AMBULATORY_CARE_PROVIDER_SITE_OTHER): Payer: Medicare Other | Admitting: Gastroenterology

## 2021-08-02 ENCOUNTER — Encounter: Payer: Self-pay | Admitting: Gastroenterology

## 2021-08-02 VITALS — BP 170/70 | HR 68 | Ht 63.0 in | Wt 158.4 lb

## 2021-08-02 DIAGNOSIS — R195 Other fecal abnormalities: Secondary | ICD-10-CM

## 2021-08-02 MED ORDER — LOPERAMIDE HCL 2 MG PO TABS: 2.0000 mg | ORAL_TABLET | Freq: Every morning | ORAL | 0 refills | Status: DC

## 2021-08-02 NOTE — Patient Instructions (Addendum)
If you are age 82 or older, your body mass index should be between 23-30. Your Body mass index is 28.06 kg/m. If this is out of the aforementioned range listed, please consider follow up with your Primary Care Provider. ________________________________________________________  The [facility] providers would like to encourage you to use [facility] to communicate with providers for non-urgent requests or questions.  Due to long hold times on the telephone, sending your provider a message by [facility] may be a faster and more efficient way to get a response.  Please allow 48 business hours for a response.  Please remember that this is for non-urgent requests.  _______________________________________________________  Please purchase the following medications over the counter and take as directed:  START: Imodium one tablet every morning.  CONTINUE: using decaffeinated products instead of caffeinated.  CONTINUE: using Tylenol instead of Advil.  You are scheduled for follow up on 09-14-21 at 10:30am.  Thank you for entrusting me with your care and choosing [facility].  Dr Ardis Hughs

## 2021-08-02 NOTE — Progress Notes (Signed)
Review of pertinent gastrointestinal problems:  1. routine risk for colon cancer, no polyps on colonoscopy April 2013  2. microscopic, lymphocytic colitis noted on biopsies, colonoscopy April 2013 with normal colon, normal terminal ileum, random colon biopsies performed. Started on budesonide 9mg  a day 3.  C. difficile 2019 also 2020 4.  Chronic diarrhea multifactorial including lymphocytic colitis, metformin use, extreme amounts of coffee every morning.  Postcholecystectomy .  I saw him looking at a lot this morning.  Yes I think we should put a count on him  HPI: This is a very pleasant 81 year old woman  She was last here in our office about 4 months ago when she saw Amy.  Amy recommended a trial of budesonide 3 mg tablets 3 pills once daily.  She also recommended she cut back on NSAIDs, she was taking 4 ibuprofen on a daily basis for her knee pains.  Her weight is unchanged since that last office visit  Blood work January 2023 showed normal CBC, essentially normal basic metabolic profile.  Normal TSH, normal LFTs,  She tried budesonide and stayed on it as directed and noticed no improvement in her chronic diarrhea.  She does not see blood in her stool.  Her weight is overall stable if not going up a bit.  She switched from regular coffee to decaf 3 days ago and might notice an improvement in her chronic diarrhea.  ROS: complete GI ROS as described in HPI, all other review negative.  Constitutional:  No unintentional weight loss   Past Medical History:  Diagnosis Date   Allergic rhinitis    Asthma    Diabetes mellitus    just changed from injection to metformin ? month   Dysrhythmia    palpitations   GERD (gastroesophageal reflux disease)    Glaucoma    History of kidney stones    Hyperlipemia    Migraines    Palpitations    Pneumonia    hx   Urinary tract infection     Past Surgical History:  Procedure Laterality Date   ABDOMINAL HYSTERECTOMY     Has ovaries   BACK  SURGERY  1987   lumb   BREAST SURGERY Left 07/2016   bx- needle in breast   CHOLECYSTECTOMY  09/26/13   CLOSED REDUCTION NASAL FRACTURE  04/29/2012   Procedure: CLOSED REDUCTION NASAL FRACTURE;  Surgeon: Jodi Marble, MD;  Location: [facility];  Service: ENT;  Laterality: N/A;  WITH STABILIZATION   COLONOSCOPY     Deviated septum repair     EYE SURGERY Bilateral    cataracts   FOOT SURGERY Bilateral    ingrown toe nails   LITHOTRIPSY     REVERSE SHOULDER ARTHROPLASTY Right 09/15/2016   Procedure: RIGHT REVERSE SHOULDER ARTHROPLASTY;  Surgeon: Netta Cedars, MD;  Location: [place];  Service: Orthopedics;  Laterality: Right;   REVERSE SHOULDER ARTHROPLASTY Left 10/15/2020   Procedure: REVERSE SHOULDER ARTHROPLASTY;  Surgeon: Netta Cedars, MD;  Location: [facility];  Service: Orthopedics;  Laterality: Left;   ROTATOR CUFF REPAIR     right   TONSILLECTOMY      Current Outpatient Medications  Medication Sig Dispense Refill   CALCIUM PO Take 1 capsule by mouth daily.     atorvastatin (LIPITOR) 20 MG tablet TAKE ONE (1) TABLET BY MOUTH EVERY DAY 90 tablet 1   Biotin 5000 MCG CAPS Take 5,000 mcg by mouth daily.     citalopram (CELEXA) 40 MG tablet TAKE ONE (1) TABLET BY  MOUTH EVERY DAY 90 tablet 1   famotidine (PEPCID) 40 MG tablet TAKE TWO TABLETS BY MOUTH EVERY NIGHT AT BEDTIME 180 tablet 1   fenofibrate (TRICOR) 145 MG tablet TAKE ONE TABLET BY MOUTH EVERY MORNING AFTER BREAKFAST 90 tablet 1   LUMIGAN 0.01 % SOLN Place 1 drop into both eyes at bedtime.      metFORMIN (GLUCOPHAGE) 1000 MG tablet Take 1,000 mg by mouth 2 (two) times daily.     mirabegron ER (MYRBETRIQ) 25 MG TB24 tablet Take 1 tablet (25 mg total) by mouth daily. 30 tablet 1   montelukast (SINGULAIR) 10 MG tablet TAKE 1 TABLET BY MOUTH AT BEDTIME 90 tablet 3   Multiple Vitamins-Minerals (CENTRUM SILVER PO) Take 1 tablet by mouth daily.     No current facility-administered medications for this visit.     Allergies as of 08/02/2021 - Review Complete 08/02/2021  Allergen Reaction Noted   Papain Anaphylaxis 01/29/2012   Papaya derivatives Anaphylaxis 08/23/2011   Mushroom ext cmplx(shiitake-reishi-mait) Other (See Comments) 08/23/2011   Mushroom extract complex Other (See Comments) 11/18/2014   Venlafaxine Other (See Comments) 08/23/2011    Family History  Problem Relation Age of Onset   Diabetes Father    Colon cancer Neg Hx    Esophageal cancer Neg Hx    Rectal cancer Neg Hx    Stomach cancer Neg Hx    Breast cancer Neg Hx     Social History   Socioeconomic History   Marital status: Widowed    Spouse name: Not on file   Number of children: 2   Years of education: Not on file   Highest education level: Not on file  Occupational History   Occupation: Retired -Sports coach: RETIRED  Tobacco Use   Smoking status: Former    Packs/day: 0.10    Years: 4.00    Pack years: 0.40    Types: Cigarettes    Quit date: 06/19/1986    Years since quitting: 35.1   Smokeless tobacco: Never   Tobacco comments:    smoked in college years ago  Vaping Use   Vaping Use: Never used  Substance and Sexual Activity   Alcohol use: Yes    Comment: 1-2 glasses week variety beer occ scotch   Drug use: No   Sexual activity: Not Currently    Birth control/protection: Surgical  Other Topics Concern   Not on file  Social History Narrative   Daily caffeine    Social Determinants of Health   Financial Resource Strain: Not on file  Food Insecurity: Not on file  Transportation Needs: Not on file  Physical Activity: Not on file  Stress: Not on file  Social Connections: Not on file  Intimate Partner Violence: Not on file     Physical Exam: BP (!) 170/70    Pulse 68    Ht 5\' 3"  (1.6 m)    Wt 158 lb 6.4 oz (71.8 kg)    BMI 28.06 kg/m  Constitutional: generally well-appearing Psychiatric: alert and oriented x3 Abdomen: soft, nontender, nondistended, no obvious ascites, no  peritoneal signs, normal bowel sounds No peripheral edema noted in lower extremities  Assessment and plan: 81 y.o. female with multifactorial chronic diarrhea  Budesonide did not help her at all.  She is going to try taking a single Imodium every morning shortly after weeks and she will return to see me in 6 weeks.  Sooner if needed.  I commended her on switching from regular  coffee to decaf as that might help as well.  She understands that metformin could be playing a role and the fact that her gallbladder has been removed by playing a role as well.  Please see the "Patient Instructions" section for addition details about the plan.  Owens Loffler, MD [facility] 08/02/2021, 10:18 AM   Total time on date of encounter was 25 minutes (this included time spent preparing to see the patient reviewing records; obtaining and/or reviewing separately obtained history; performing a medically appropriate exam and/or evaluation; counseling and educating the patient and family if present; ordering medications, tests or procedures if applicable; and documenting clinical information in the health record).

## 2021-08-03 ENCOUNTER — Encounter: Payer: Self-pay | Admitting: Family Medicine

## 2021-08-03 ENCOUNTER — Ambulatory Visit (INDEPENDENT_AMBULATORY_CARE_PROVIDER_SITE_OTHER): Payer: Medicare Other | Admitting: Family Medicine

## 2021-08-03 VITALS — BP 118/70 | HR 88 | Temp 97.8°F | Resp 17 | Wt 160.6 lb

## 2021-08-03 DIAGNOSIS — E119 Type 2 diabetes mellitus without complications: Secondary | ICD-10-CM

## 2021-08-03 DIAGNOSIS — E663 Overweight: Secondary | ICD-10-CM | POA: Diagnosis not present

## 2021-08-03 MED ORDER — OZEMPIC (0.25 OR 0.5 MG/DOSE) 2 MG/1.5ML ~~LOC~~ SOPN: 0.5000 mg | PEN_INJECTOR | SUBCUTANEOUS | 2 refills | Status: DC

## 2021-08-03 NOTE — Progress Notes (Signed)
° °  Subjective:    Patient ID: [name], female    DOB: 02-12-1941, 81 y.o.   MRN: [id number]  HPI Overweight- 'i just need to lose some weight and i'm not having any luck'.  Pt has diabetes and feels that if she could lose 20 lbs she would be able to come off multiple meds.   Currently seeing Dr Posey Pronto for diabetes.  Pt is currently on Metformin and has ongoing issue w/ diarrhea.   Review of Systems For ROS see HPI   This visit occurred during the SARS-CoV-2 public health emergency.  Safety protocols were in place, including screening questions prior to the visit, additional usage of staff PPE, and extensive cleaning of exam room while observing appropriate contact time as indicated for disinfecting solutions.      Objective:   Physical Exam Vitals reviewed.  Constitutional:      General: She is not in acute distress.    Appearance: Normal appearance. She is not ill-appearing.  HENT:     Head: Normocephalic and atraumatic.  Eyes:     Extraocular Movements: Extraocular movements intact.     Conjunctiva/sclera: Conjunctivae normal.     Pupils: Pupils are equal, round, and reactive to light.  Cardiovascular:     Rate and Rhythm: Normal rate and regular rhythm.  Pulmonary:     Effort: Pulmonary effort is normal. No respiratory distress.     Breath sounds: Normal breath sounds.  Musculoskeletal:     Right lower leg: No edema.     Left lower leg: No edema.  Skin:    General: Skin is warm and dry.  Neurological:     General: No focal deficit present.     Mental Status: She is alert and oriented to person, place, and time.  Psychiatric:        Mood and Affect: Mood normal.        Behavior: Behavior normal.        Thought Content: Thought content normal.          Assessment & Plan:

## 2021-08-03 NOTE — Assessment & Plan Note (Signed)
Ongoing issue for pt.  BMI 28.45  She wants to lose weight in hopes of coming off medications.  Will start Ozempic for both sugar and weight control.  Pt expressed understanding and is in agreement w/ plan.

## 2021-08-03 NOTE — Assessment & Plan Note (Addendum)
Chronic problem.  Following w/ Dr Posey Pronto.  Pt has ongoing issues w/ diarrhea so we will stop Metformin and switch to Ozempic.  Will use Ozempic for both sugar control and weight loss.  Reviewed potential side effects and appropriate use.  Prescription sent to pharmacy.  Will follow closely.

## 2021-08-03 NOTE — Patient Instructions (Signed)
Follow up in 6 weeks to recheck weight loss progress STOP the Metformin START the Ozempic weekly for both sugar control and weight loss The biggest side effect of Ozempic is nausea but this tends to improve Continue to work on healthy diet and regular exercise- you're doing great!! Call with any questions or concerns Hang in there!!!

## 2021-08-22 ENCOUNTER — Encounter: Payer: Self-pay | Admitting: Gastroenterology

## 2021-08-24 ENCOUNTER — Other Ambulatory Visit: Payer: Self-pay | Admitting: Family Medicine

## 2021-09-03 DIAGNOSIS — Z20822 Contact with and (suspected) exposure to covid-19: Secondary | ICD-10-CM | POA: Diagnosis not present

## 2021-09-05 ENCOUNTER — Telehealth (INDEPENDENT_AMBULATORY_CARE_PROVIDER_SITE_OTHER): Payer: Medicare Other | Admitting: Family Medicine

## 2021-09-05 DIAGNOSIS — J069 Acute upper respiratory infection, unspecified: Secondary | ICD-10-CM | POA: Diagnosis not present

## 2021-09-05 DIAGNOSIS — J029 Acute pharyngitis, unspecified: Secondary | ICD-10-CM

## 2021-09-05 NOTE — Progress Notes (Signed)
Virtual Visit via Video Note 3:47 PM Not on caregility, called no answer, left VM. Repeat link sent 3:57 PM No answer- left VM.   I connected with [name] on 09/05/21 at 4:33 PM by a video enabled telemedicine application and verified that I am speaking with the correct person using two identifiers.  Patient location: home. By self My location: office - [employer].    I discussed the limitations, risks, security and privacy concerns of performing an evaluation and management service by telephone and the availability of in person appointments. I also discussed with the patient that there may be a patient responsible charge related to this service. The patient expressed understanding and agreed to proceed, consent obtained  Chief complaint:  Chief Complaint  Patient presents with   Sore Throat    Pt notes sore throat, runny nose, and ear pain starting this morning, has been caring for a sick person notes they have tested negative for the flu, notes fatigue over the last few days     History of Present Illness: [name] is a 81 y.o. female Sore Throat: Fatigue last few days, but has been overdoing it. Helping 90yo friend Theron Arista.  Sore throat, runny nose and some nasal congestion. Minimal cough. Ear pain, stopped up feeling this morning. Sick contact at home. No fever. No shortness of breath or chest pains.  Some soreness in back yesterday at church.  No headache. Eyes feel tired.  Able to eat soup, drinking fluids. Friend has been sick recently, but getting better. He did not have covid.  Went to large dinner at [employer] for [employer] 2 days ago.  Has not tested self for covid. Able to be seen by medical provider at [facility], [address].   Tx: dayquil, nyquil.   Immunization History  Administered Date(s) Administered   Influenza Split 04/11/2011   Influenza, High Dose Seasonal PF 02/21/2019, 03/19/2020, 04/11/2021   Influenza,inj,Quad PF,6+ Mos 04/10/2013,  03/10/2014, 04/20/2015, 02/29/2016, 03/20/2017, 02/06/2018   Moderna Sars-Covid-2 Vaccination 07/29/2019, 08/12/2019   Pneumococcal Conjugate-13 08/27/2014   Pneumococcal Polysaccharide-23 04/19/2010   Tdap 04/20/2012   Zoster Recombinat (Shingrix) 04/15/2018, 07/15/2018  UTD on covid vaccines, 2 shots, and one booster- not sure when had most recent booster. Prior covid infection 01/11/21.    Patient Active Problem List   Diagnosis Date Noted   Pain of left hip joint 06/03/2021   Gastro-esophageal reflux disease without esophagitis 10/15/2020   Post-traumatic osteoarthritis 08/10/2020   Overweight (BMI 25.0-29.9) 06/01/2020   Pain in left knee 05/27/2018   Thyroid nodule 03/13/2018   Chronic diarrhea 12/26/2017   S/P shoulder replacement, right 09/15/2016   Benign paroxysmal positional vertigo 10/14/2014   Back pain 08/27/2014   Decreased hearing of both ears 05/19/2014   S/P cholecystectomy 10/02/2013   Asthma, moderate persistent 05/16/2013   H/O recurrent pneumonia 05/16/2013   Insomnia 05/05/2013   Gluten intolerance 05/06/2012   Renal calculus, left 01/29/2012   Painful bladder spasm 12/14/2011   Lactose intolerance 12/14/2011   Post-menopausal 12/14/2011   Microscopic colitis 11/08/2011   Hepatic steatosis 08/25/2011   Pulmonary nodule, right 08/25/2011   Hematuria 08/23/2011   Memory loss 12/12/2010   Diabetes mellitus type II, controlled, with no complications (HCC) 07/14/2010   Hyperlipidemia 07/14/2010   Depression with anxiety 07/14/2010   GLAUCOMA 07/14/2010   ALLERGIC RHINITIS 07/14/2010   Past Medical History:  Diagnosis Date   Allergic rhinitis    Asthma    Diabetes mellitus  just changed from injection to metformin ? month   Dysrhythmia    palpitations   GERD (gastroesophageal reflux disease)    Glaucoma    History of kidney stones    Hyperlipemia    Migraines    Palpitations    Pneumonia    hx   Urinary tract infection    Past Surgical  History:  Procedure Laterality Date   ABDOMINAL HYSTERECTOMY     Has ovaries   BACK SURGERY  1987   lumb   BREAST SURGERY Left 07/2016   bx- needle in breast   CHOLECYSTECTOMY  09/26/13   CLOSED REDUCTION NASAL FRACTURE  04/29/2012   Procedure: CLOSED REDUCTION NASAL FRACTURE;  Surgeon: Flo Shanks, MD;  Location: [facility];  Service: ENT;  Laterality: N/A;  WITH STABILIZATION   COLONOSCOPY     Deviated septum repair     EYE SURGERY Bilateral    cataracts   FOOT SURGERY Bilateral    ingrown toe nails   LITHOTRIPSY     REVERSE SHOULDER ARTHROPLASTY Right 09/15/2016   Procedure: RIGHT REVERSE SHOULDER ARTHROPLASTY;  Surgeon: Beverely Low, MD;  Location: [facility];  Service: Orthopedics;  Laterality: Right;   REVERSE SHOULDER ARTHROPLASTY Left 10/15/2020   Procedure: REVERSE SHOULDER ARTHROPLASTY;  Surgeon: Beverely Low, MD;  Location: [facility];  Service: Orthopedics;  Laterality: Left;   ROTATOR CUFF REPAIR     right   TONSILLECTOMY     Allergies  Allergen Reactions   Papain Anaphylaxis    Takes an extreme concentration   Papaya Derivatives Anaphylaxis   Mushroom Ext Cmplx(Shiitake-Reishi-Mait) Other (See Comments)    Head congestion and sore throat   Mushroom Extract Complex Other (See Comments)    Head congestion and sore throat after eating 2 days in row   Venlafaxine Other (See Comments)    UNSPECIFIED REACTION  Pt states she can take Name brand. Lethargy   Prior to Admission medications   Medication Sig Start Date End Date Taking? Authorizing Provider  atorvastatin (LIPITOR) 20 MG tablet TAKE ONE (1) TABLET BY MOUTH EVERY DAY 06/23/21  Yes Sheliah Hatch, MD  Biotin 5000 MCG CAPS Take 5,000 mcg by mouth daily.   Yes [provider]  CALCIUM PO Take 1 capsule by mouth daily.   Yes [provider]  citalopram (CELEXA) 40 MG tablet TAKE ONE (1) TABLET BY MOUTH EVERY DAY 02/17/21  Yes Sheliah Hatch, MD  famotidine (PEPCID) 40 MG tablet  TAKE TWO TABLETS BY MOUTH EVERY NIGHT AT BEDTIME 10/18/20  Yes Sheliah Hatch, MD  fenofibrate (TRICOR) 145 MG tablet TAKE ONE TABLET BY MOUTH EVERY MORNING AFTER BREAKFAST 08/25/21  Yes Sheliah Hatch, MD  loperamide (IMODIUM A-D) 2 MG tablet Take 1 tablet (2 mg total) by mouth in the morning. 08/02/21  Yes Rachael Fee, MD  LUMIGAN 0.01 % SOLN Place 1 drop into both eyes at bedtime.  09/07/14  Yes [provider]  mirabegron ER (MYRBETRIQ) 25 MG TB24 tablet Take 1 tablet (25 mg total) by mouth daily. 04/12/21  Yes Esterwood, Amy S, PA-C  montelukast (SINGULAIR) 10 MG tablet TAKE 1 TABLET BY MOUTH AT BEDTIME 03/31/21  Yes Sheliah Hatch, MD  Multiple Vitamins-Minerals (CENTRUM SILVER PO) Take 1 tablet by mouth daily.   Yes [provider]  Semaglutide,0.25 or 0.5MG /DOS, (OZEMPIC, 0.25 OR 0.5 MG/DOSE,) 2 MG/1.5ML SOPN Inject 0.5 mg into the skin once a week. 08/03/21  Yes Sheliah Hatch, MD  Social History   Socioeconomic History   Marital status: Widowed    Spouse name: Not on file   Number of children: 2   Years of education: Not on file   Highest education level: Not on file  Occupational History   Occupation: Retired -Psychiatrist: RETIRED  Tobacco Use   Smoking status: Former    Packs/day: 0.10    Years: 4.00    Pack years: 0.40    Types: Cigarettes    Quit date: 06/19/1986    Years since quitting: 35.2   Smokeless tobacco: Never   Tobacco comments:    smoked in college years ago  Vaping Use   Vaping Use: Never used  Substance and Sexual Activity   Alcohol use: Yes    Comment: 1-2 glasses week variety beer occ scotch   Drug use: No   Sexual activity: Not Currently    Birth control/protection: Surgical  Other Topics Concern   Not on file  Social History Narrative   Daily caffeine    Social Determinants of Health   Financial Resource Strain: Not on file  Food Insecurity: Not on file  Transportation Needs: Not on file   Physical Activity: Not on file  Stress: Not on file  Social Connections: Not on file  Intimate Partner Violence: Not on file    Observations/Objective: There were no vitals filed for this visit. Nontoxic appearance on video, speaking in full sentences without respiratory distress.  Coherent responses.  No cough during visit.  Vocalizing normally.  No audible stridor or wheeze on video.  All questions answered with understanding plan expressed.  Assessment and Plan: Sore throat  Viral upper respiratory tract infection Suspected viral syndrome, sick contact without apparent exposure to COVID but was around large crowd 2 days ago.  Home COVID testing recommended, plan is to have that done through medical provider at her place of living tomorrow..  Repeat testing at 24 to 36 hours later if initial testing negative.  Symptomatic care discussed with Mucinex, fluids, rest, sore throat lozenges, Tylenol.  ER/RTC precautions discussed.  If COVID-positive, can discuss antivirals.  All questions were answered.  Follow Up Instructions:  ER/urgent care precautions given, and call if positive COVID test to discuss antivirals.  Patient Instructions  Your symptoms sound like a virus. I do recommend a covid test tomorrow and again in 24-36 hours. Let me know results.  Sore throat lozenge if needed, tylenol for body aches and sore throat.  Make sure to drink plenty of fluids.  Mucinex if needed for cough.  If any chest pain, shortness of breath, confusion or unable to drink fluids be seen right away.  Hope you feel better soon. Please let me know if there are any questions.     I discussed the assessment and treatment plan with the patient. The patient was provided an opportunity to ask questions and all were answered. The patient agreed with the plan and demonstrated an understanding of the instructions.   The patient was advised to call back or seek an in-person evaluation if the symptoms worsen or  if the condition fails to improve as anticipated.   Shade Flood, MD

## 2021-09-05 NOTE — Patient Instructions (Addendum)
Your symptoms sound like a virus. I do recommend a covid test tomorrow and again in 24-36 hours. Let me know results.  ?Sore throat lozenge if needed, tylenol for body aches and sore throat.  ?Make sure to drink plenty of fluids.  ?Mucinex if needed for cough.  ?If any chest pain, shortness of breath, confusion or unable to drink fluids be seen right away.  ?Hope you feel better soon. Please let me know if there are any questions.  ?

## 2021-09-08 ENCOUNTER — Telehealth: Payer: Self-pay

## 2021-09-08 NOTE — Telephone Encounter (Signed)
Good news, appreciate the update ?

## 2021-09-08 NOTE — Telephone Encounter (Signed)
Patient called in wanting to let Dr.Greene know that she was negative for covid and still has a sore throat but is overall feeling better.  ?

## 2021-09-12 DIAGNOSIS — M25562 Pain in left knee: Secondary | ICD-10-CM | POA: Diagnosis not present

## 2021-09-13 ENCOUNTER — Ambulatory Visit: Payer: Medicare Other | Admitting: Family Medicine

## 2021-09-13 ENCOUNTER — Telehealth: Payer: Self-pay | Admitting: Gastroenterology

## 2021-09-13 ENCOUNTER — Telehealth: Payer: Self-pay

## 2021-09-13 NOTE — Telephone Encounter (Signed)
Patient called in to update Dr.Tabori that she had to cx her appointment today due to her recently injuring her knee and apologized for the late notice. [name] also wanted to let Dr.Tabori know that she has lost 10 pounds since the last time she was here, she is now down to 150 lb. ?

## 2021-09-13 NOTE — Telephone Encounter (Signed)
Good Morning Dr. Ardis Hughs,  ? ? ?Patient called to cancel appointment with you tomorrow at 10:30 due to hurting her knee. ? ?Patient stated that she will call back at a later time to reschedule.  ?

## 2021-09-13 NOTE — Telephone Encounter (Signed)
We certainly understand the need to cancel and I just hope she is feeling better soon!  CONGRATS on the weight loss!!! ?

## 2021-09-14 ENCOUNTER — Ambulatory Visit: Payer: Medicare Other | Admitting: Gastroenterology

## 2021-09-21 DIAGNOSIS — M25562 Pain in left knee: Secondary | ICD-10-CM | POA: Diagnosis not present

## 2021-09-21 DIAGNOSIS — M1180 Other specified crystal arthropathies, unspecified site: Secondary | ICD-10-CM | POA: Diagnosis not present

## 2021-09-22 ENCOUNTER — Other Ambulatory Visit: Payer: Self-pay | Admitting: Family Medicine

## 2021-09-26 ENCOUNTER — Telehealth: Payer: Self-pay | Admitting: Gastroenterology

## 2021-09-26 NOTE — Telephone Encounter (Signed)
Patient called to reschedule her appointment after having to have knee surgery.  She is still experiencing diarrhea and is to the point where she can't control her bowels, especially at night, wears diapers, and is losing weight.  She takes Imodium in the mornings and stopped taking the budesonide about a month ago.  She has an appointment to see Dr. Ardis Hughs 5/31 and wants to know if there is anything else she should be doing in the interim.  Please call patient and advise.  Thank you. ?

## 2021-09-26 NOTE — Telephone Encounter (Signed)
The pt returned call and states that she did not need a return call at this time. She is asking for a call if we have any cancellations for her appt in the office. She will continue imodium as directed by Dr Ardis Hughs and keep appt as planned unless we have a cancellation.  ?

## 2021-09-26 NOTE — Telephone Encounter (Signed)
Left message on machine to call back  

## 2021-10-03 DIAGNOSIS — Z20822 Contact with and (suspected) exposure to covid-19: Secondary | ICD-10-CM | POA: Diagnosis not present

## 2021-10-10 ENCOUNTER — Ambulatory Visit (INDEPENDENT_AMBULATORY_CARE_PROVIDER_SITE_OTHER): Payer: Medicare Other | Admitting: Family Medicine

## 2021-10-10 ENCOUNTER — Encounter: Payer: Self-pay | Admitting: Family Medicine

## 2021-10-10 VITALS — BP 130/60 | HR 84 | Temp 97.7°F | Resp 16 | Wt 149.4 lb

## 2021-10-10 DIAGNOSIS — E663 Overweight: Secondary | ICD-10-CM

## 2021-10-10 NOTE — Progress Notes (Signed)
? ?  Subjective:  ? ? Patient ID: [name], female    DOB: 08-15-1940, 81 y.o.   MRN: [id number] ? ?HPI ?Weight loss- pt is down 12 lbs since starting Ozempic on 2/15.  Pt's BMI now 26.47.  Her goal is to lose another 7-8 lbs b/c she wants to stop diabetes medication.  Pt is eating a diet high in veggies.  No N/V.  No abd pain.  Denies CP, SOB. ? ? ?Review of Systems ?For ROS see HPI  ?   ?Objective:  ? Physical Exam ?Vitals reviewed.  ?Constitutional:   ?   General: She is not in acute distress. ?   Appearance: Normal appearance. She is not ill-appearing.  ?HENT:  ?   Head: Normocephalic and atraumatic.  ?Cardiovascular:  ?   Rate and Rhythm: Normal rate.  ?Pulmonary:  ?   Effort: Pulmonary effort is normal. No respiratory distress.  ?Skin: ?   General: Skin is warm and dry.  ?Neurological:  ?   General: No focal deficit present.  ?   Mental Status: She is alert and oriented to person, place, and time.  ?Psychiatric:     ?   Mood and Affect: Mood normal.     ?   Behavior: Behavior normal.     ?   Thought Content: Thought content normal.  ? ? ? ? ? ?   ?Assessment & Plan:  ? ? ?

## 2021-10-10 NOTE — Assessment & Plan Note (Signed)
Pt is down 12 lbs since starting Ozempic.  She is eating a diet high in veggies and is walking regularly.  Applauded her efforts.  Will continue to follow. ?

## 2021-10-10 NOTE — Patient Instructions (Signed)
Follow up in 3-4 months to recheck cholesterol ?No need for labs today- you look great!!! ?Keep up the good work on healthy diet and regular exercise- you're doing great!!! ?Call with any questions or concerns ?Stay Safe!  Stay Healthy! ?HAPPY EARLY BIRTHDAY!!! ?

## 2021-10-19 ENCOUNTER — Other Ambulatory Visit: Payer: Self-pay | Admitting: Family Medicine

## 2021-10-22 DIAGNOSIS — Z20822 Contact with and (suspected) exposure to covid-19: Secondary | ICD-10-CM | POA: Diagnosis not present

## 2021-10-24 DIAGNOSIS — Z20822 Contact with and (suspected) exposure to covid-19: Secondary | ICD-10-CM | POA: Diagnosis not present

## 2021-10-25 DIAGNOSIS — H43813 Vitreous degeneration, bilateral: Secondary | ICD-10-CM | POA: Diagnosis not present

## 2021-10-25 DIAGNOSIS — H35373 Puckering of macula, bilateral: Secondary | ICD-10-CM | POA: Diagnosis not present

## 2021-10-25 DIAGNOSIS — H401131 Primary open-angle glaucoma, bilateral, mild stage: Secondary | ICD-10-CM | POA: Diagnosis not present

## 2021-10-25 DIAGNOSIS — E119 Type 2 diabetes mellitus without complications: Secondary | ICD-10-CM | POA: Diagnosis not present

## 2021-11-01 DIAGNOSIS — Z23 Encounter for immunization: Secondary | ICD-10-CM | POA: Diagnosis not present

## 2021-11-03 ENCOUNTER — Ambulatory Visit (INDEPENDENT_AMBULATORY_CARE_PROVIDER_SITE_OTHER): Payer: Medicare Other

## 2021-11-03 DIAGNOSIS — Z0000 Encounter for general adult medical examination without abnormal findings: Secondary | ICD-10-CM

## 2021-11-03 NOTE — Patient Instructions (Signed)
[name] , Thank you for taking time to come for your Medicare Wellness Visit. I appreciate your ongoing commitment to your health goals. Please review the following plan we discussed and let me know if I can assist you in the future.   Screening recommendations/referrals: Colonoscopy: No longer required  Mammogram: Done 07/21/21 repeat every year  Bone Density: Done 09/17/20 repeat every 2 years  Recommended yearly ophthalmology/optometry visit for glaucoma screening and checkup Recommended yearly dental visit for hygiene and checkup  Vaccinations: Influenza vaccine: Done 04/11/21 repeat every year  Pneumococcal vaccine: Up to date Tdap vaccine: Done 11/*2/13 repeat every 10 years due 05/01/22 Shingles vaccine: Completed 04/15/18, 07/15/18   Covid-19:completed 2/9, 08/12/19, 11/02/21  Advanced directives: Please bring a copy of your health care power of attorney and living will to the office at your convenience.  Conditions/risks identified: work on Personal assistant and yoga   Next appointment: Follow up in one year for your annual wellness visit    Preventive Care 65 Years and Older, Female Preventive care refers to lifestyle choices and visits with your health care provider that can promote health and wellness. What does preventive care include? A yearly physical exam. This is also called an annual well check. Dental exams once or twice a year. Routine eye exams. Ask your health care provider how often you should have your eyes checked. Personal lifestyle choices, including: Daily care of your teeth and gums. Regular physical activity. Eating a healthy diet. Avoiding tobacco and drug use. Limiting alcohol use. Practicing safe sex. Taking low-dose aspirin every day. Taking vitamin and mineral supplements as recommended by your health care provider. What happens during an annual well check? The services and screenings done by your health care provider during your annual well  check will depend on your age, overall health, lifestyle risk factors, and family history of disease. Counseling  Your health care provider may ask you questions about your: Alcohol use. Tobacco use. Drug use. Emotional well-being. Home and relationship well-being. Sexual activity. Eating habits. History of falls. Memory and ability to understand (cognition). Work and work Statistician. Reproductive health. Screening  You may have the following tests or measurements: Height, weight, and BMI. Blood pressure. Lipid and cholesterol levels. These may be checked every 5 years, or more frequently if you are over 68 years old. Skin check. Lung cancer screening. You may have this screening every year starting at age 64 if you have a 30-pack-year history of smoking and currently smoke or have quit within the past 15 years. Fecal occult blood test (FOBT) of the stool. You may have this test every year starting at age 2. Flexible sigmoidoscopy or colonoscopy. You may have a sigmoidoscopy every 5 years or a colonoscopy every 10 years starting at age 24. Hepatitis C blood test. Hepatitis B blood test. Sexually transmitted disease (STD) testing. Diabetes screening. This is done by checking your blood sugar (glucose) after you have not eaten for a while (fasting). You may have this done every 1-3 years. Bone density scan. This is done to screen for osteoporosis. You may have this done starting at age 53. Mammogram. This may be done every 1-2 years. Talk to your health care provider about how often you should have regular mammograms. Talk with your health care provider about your test results, treatment options, and if necessary, the need for more tests. Vaccines  Your health care provider may recommend certain vaccines, such as: Influenza vaccine. This is recommended every year. Tetanus, diphtheria,  and acellular pertussis (Tdap, Td) vaccine. You may need a Td booster every 10 years. Zoster  vaccine. You may need this after age 41. Pneumococcal 13-valent conjugate (PCV13) vaccine. One dose is recommended after age 53. Pneumococcal polysaccharide (PPSV23) vaccine. One dose is recommended after age 18. Talk to your health care provider about which screenings and vaccines you need and how often you need them. This information is not intended to replace advice given to you by your health care provider. Make sure you discuss any questions you have with your health care provider. Document Released: 07/02/2015 Document Revised: 02/23/2016 Document Reviewed: 04/06/2015 Elsevier Interactive Patient Education  2017 [employer] Prevention in the Home Falls can cause injuries. They can happen to people of all ages. There are many things you can do to make your home safe and to help prevent falls. What can I do on the outside of my home? Regularly fix the edges of walkways and driveways and fix any cracks. Remove anything that might make you trip as you walk through a door, such as a raised step or threshold. Trim any bushes or trees on the path to your home. Use bright outdoor lighting. Clear any walking paths of anything that might make someone trip, such as rocks or tools. Regularly check to see if handrails are loose or broken. Make sure that both sides of any steps have handrails. Any raised decks and porches should have guardrails on the edges. Have any leaves, snow, or ice cleared regularly. Use sand or salt on walking paths during winter. Clean up any spills in your garage right away. This includes oil or grease spills. What can I do in the bathroom? Use night lights. Install grab bars by the toilet and in the tub and shower. Do not use towel bars as grab bars. Use non-skid mats or decals in the tub or shower. If you need to sit down in the shower, use a plastic, non-slip stool. Keep the floor dry. Clean up any water that spills on the floor as soon as it happens. Remove  soap buildup in the tub or shower regularly. Attach bath mats securely with double-sided non-slip rug tape. Do not have throw rugs and other things on the floor that can make you trip. What can I do in the bedroom? Use night lights. Make sure that you have a light by your bed that is easy to reach. Do not use any sheets or blankets that are too big for your bed. They should not hang down onto the floor. Have a firm chair that has side arms. You can use this for support while you get dressed. Do not have throw rugs and other things on the floor that can make you trip. What can I do in the kitchen? Clean up any spills right away. Avoid walking on wet floors. Keep items that you use a lot in easy-to-reach places. If you need to reach something above you, use a strong step stool that has a grab bar. Keep electrical cords out of the way. Do not use floor polish or wax that makes floors slippery. If you must use wax, use non-skid floor wax. Do not have throw rugs and other things on the floor that can make you trip. What can I do with my stairs? Do not leave any items on the stairs. Make sure that there are handrails on both sides of the stairs and use them. Fix handrails that are broken or loose. Make sure  that handrails are as long as the stairways. Check any carpeting to make sure that it is firmly attached to the stairs. Fix any carpet that is loose or worn. Avoid having throw rugs at the top or bottom of the stairs. If you do have throw rugs, attach them to the floor with carpet tape. Make sure that you have a light switch at the top of the stairs and the bottom of the stairs. If you do not have them, ask someone to add them for you. What else can I do to help prevent falls? Wear shoes that: Do not have high heels. Have rubber bottoms. Are comfortable and fit you well. Are closed at the toe. Do not wear sandals. If you use a stepladder: Make sure that it is fully opened. Do not climb a  closed stepladder. Make sure that both sides of the stepladder are locked into place. Ask someone to hold it for you, if possible. Clearly mark and make sure that you can see: Any grab bars or handrails. First and last steps. Where the edge of each step is. Use tools that help you move around (mobility aids) if they are needed. These include: Canes. Walkers. Scooters. Crutches. Turn on the lights when you go into a dark area. Replace any light bulbs as soon as they burn out. Set up your furniture so you have a clear path. Avoid moving your furniture around. If any of your floors are uneven, fix them. If there are any pets around you, be aware of where they are. Review your medicines with your doctor. Some medicines can make you feel dizzy. This can increase your chance of falling. Ask your doctor what other things that you can do to help prevent falls. This information is not intended to replace advice given to you by your health care provider. Make sure you discuss any questions you have with your health care provider. Document Released: 04/01/2009 Document Revised: 11/11/2015 Document Reviewed: 07/10/2014 Elsevier Interactive Patient Education  2017 [employer].

## 2021-11-03 NOTE — Progress Notes (Addendum)
Virtual Visit via Telephone Note  I connected with  [name] on 11/03/21 at  9:00 AM EDT by telephone and verified that I am speaking with the correct person using two identifiers.  Medicare Annual Wellness visit completed telephonically due to Covid-19 pandemic.   Persons participating in this call: This Health Coach and this patient.   Location: Patient: Home Provider: office    I discussed the limitations, risks, security and privacy concerns of performing an evaluation and management service by telephone and the availability of in person appointments. The patient expressed understanding and agreed to proceed.  Unable to perform video visit due to video visit attempted and failed and/or patient does not have video capability.   Some vital signs may be absent or patient reported.   Willette Brace, LPN   Subjective:   [name] is a 81 y.o. female who presents for Medicare Annual (Subsequent) preventive examination.  Review of Systems     Cardiac Risk Factors include: advanced age (>39mn, >>27women);diabetes mellitus;dyslipidemia     Objective:    There were no vitals filed for this visit. There is no height or weight on file to calculate BMI.     11/03/2021    9:20 AM 10/13/2020   10:07 AM 06/28/2020    3:53 PM 02/27/2018   11:57 AM 08/15/2017    2:42 PM 09/08/2016   10:28 AM 07/08/2014    9:25 AM  Advanced Directives  Does Patient Have a Medical Advance Directive? Yes Yes Yes Yes Yes Yes Yes  Type of AParamedicof ALa MiradaLiving will HPort Jefferson StationLiving will HHamiltonLiving will HMiami SpringsLiving will HSt. PeterLiving will HAnch[place]Living will Living will;Healthcare Power of Attorney  Does patient want to make changes to medical advance directive?      No - Patient declined   Copy of [place] Chart? No - copy requested No  - copy requested Yes - validated most recent copy scanned in chart (See row information)  Yes Yes     Current Medications (verified) Outpatient Encounter Medications as of 11/03/2021  Medication Sig   atorvastatin (LIPITOR) 20 MG tablet TAKE ONE (1) TABLET BY MOUTH EVERY DAY   Biotin 5000 MCG CAPS Take 5,000 mcg by mouth daily.   budesonide (ENTOCORT EC) 3 MG 24 hr capsule Take 3 mg by mouth daily. 1 every 3 days   CALCIUM PO Take 1 capsule by mouth daily.   citalopram (CELEXA) 40 MG tablet TAKE ONE (1) TABLET BY MOUTH EVERY DAY   fenofibrate (TRICOR) 145 MG tablet TAKE ONE TABLET BY MOUTH EVERY MORNING AFTER BREAKFAST   ibuprofen (ADVIL) 200 MG tablet Take 200 mg by mouth every 6 (six) hours as needed.   LUMIGAN 0.01 % SOLN Place 1 drop into both eyes at bedtime.    Melatonin 10 MG TABS Take by mouth.   montelukast (SINGULAIR) 10 MG tablet TAKE 1 TABLET BY MOUTH AT BEDTIME   Multiple Vitamins-Minerals (CENTRUM SILVER PO) Take 1 tablet by mouth daily.   OZEMPIC, 0.25 OR 0.5 MG/DOSE, 2 MG/1.5ML SOPN INJECT 0.'5MG'$  INTO THE SKIN ONCE A WEEK   [DISCONTINUED] famotidine (PEPCID) 40 MG tablet TAKE TWO TABLETS BY MOUTH EVERY NIGHT AT BEDTIME   [DISCONTINUED] loperamide (IMODIUM A-D) 2 MG tablet Take 1 tablet (2 mg total) by mouth in the morning.   [DISCONTINUED] mirabegron ER (MYRBETRIQ) 25 MG TB24 tablet Take 1  tablet (25 mg total) by mouth daily.   No facility-administered encounter medications on file as of 11/03/2021.    Allergies (verified) Papain, Papaya derivatives, Mushroom ext cmplx(shiitake-reishi-mait), Mushroom extract complex, and Venlafaxine   History: Past Medical History:  Diagnosis Date   Allergic rhinitis    Asthma    Diabetes mellitus    just changed from injection to metformin ? month   Dysrhythmia    palpitations   GERD (gastroesophageal reflux disease)    Glaucoma    History of kidney stones    Hyperlipemia    Migraines    Palpitations    Pneumonia    hx    Urinary tract infection    Past Surgical History:  Procedure Laterality Date   ABDOMINAL HYSTERECTOMY     Has ovaries   BACK SURGERY  1987   lumb   BREAST SURGERY Left 07/2016   bx- needle in breast   CHOLECYSTECTOMY  09/26/13   CLOSED REDUCTION NASAL FRACTURE  04/29/2012   Procedure: CLOSED REDUCTION NASAL FRACTURE;  Surgeon: Jodi Marble, MD;  Location: [facility];  Service: ENT;  Laterality: N/A;  WITH STABILIZATION   COLONOSCOPY     Deviated septum repair     EYE SURGERY Bilateral    cataracts   FOOT SURGERY Bilateral    ingrown toe nails   LITHOTRIPSY     REVERSE SHOULDER ARTHROPLASTY Right 09/15/2016   Procedure: RIGHT REVERSE SHOULDER ARTHROPLASTY;  Surgeon: Netta Cedars, MD;  Location: [facility];  Service: Orthopedics;  Laterality: Right;   REVERSE SHOULDER ARTHROPLASTY Left 10/15/2020   Procedure: REVERSE SHOULDER ARTHROPLASTY;  Surgeon: Netta Cedars, MD;  Location: [facility];  Service: Orthopedics;  Laterality: Left;   ROTATOR CUFF REPAIR     right   TONSILLECTOMY     Family History  Problem Relation Age of Onset   Diabetes Father    Colon cancer Neg Hx    Esophageal cancer Neg Hx    Rectal cancer Neg Hx    Stomach cancer Neg Hx    Breast cancer Neg Hx    Social History   Socioeconomic History   Marital status: Widowed    Spouse name: Not on file   Number of children: 2   Years of education: Not on file   Highest education level: Not on file  Occupational History   Occupation: Retired -Sports coach: RETIRED  Tobacco Use   Smoking status: Former    Packs/day: 0.10    Years: 4.00    Pack years: 0.40    Types: Cigarettes    Quit date: 06/19/1986    Years since quitting: 35.4   Smokeless tobacco: Never   Tobacco comments:    smoked in college years ago  Vaping Use   Vaping Use: Never used  Substance and Sexual Activity   Alcohol use: Yes    Comment: 1-2 glasses week variety beer occ scotch   Drug use: No   Sexual activity: Not  Currently    Birth control/protection: Surgical  Other Topics Concern   Not on file  Social History Narrative   Daily caffeine    Social Determinants of Health   Financial Resource Strain: Low Risk    Difficulty of Paying Living Expenses: Not hard at all  Food Insecurity: No Food Insecurity   Worried About Charity fundraiser in the Last Year: Never true   [place] in the Last Year: Never true  Transportation Needs: No  Transportation Needs   Lack of Transportation (Medical): No   Lack of Transportation (Non-Medical): No  Physical Activity: Sufficiently Active   Days of Exercise per Week: 3 days   Minutes of Exercise per Session: 50 min  Stress: No Stress Concern Present   Feeling of Stress : Not at all  Social Connections: Moderately Integrated   Frequency of Communication with Friends and Family: More than three times a week   Frequency of Social Gatherings with Friends and Family: More than three times a week   Attends Religious Services: More than 4 times per year   Active Member of [employer] or Organizations: Yes   Attends Archivist Meetings: 1 to 4 times per year   Marital Status: Widowed    Tobacco Counseling Counseling given: Not Answered Tobacco comments: smoked in college years ago   Clinical Intake:  Pre-visit preparation completed: Yes  Pain : No/denies pain     BMI - recorded: 26.47 Nutritional Status: BMI 25 -29 Overweight Nutritional Risks: None Diabetes: Yes CBG done?: No Did pt. bring in CBG monitor from home?: No  How often do you need to have someone help you when you read instructions, pamphlets, or other written materials from your doctor or pharmacy?: 1 - Never  Diabetic?Nutrition Risk Assessment:  Has the patient had any N/V/D within the last 2 months?  No  Does the patient have any non-healing wounds?  No  Has the patient had any unintentional weight loss or weight gain?  No   Diabetes:  Is the patient diabetic?  Yes   If diabetic, was a CBG obtained today?  No  Did the patient bring in their glucometer from home?  No  How often do you monitor your CBG's? N/A.   Financial Strains and Diabetes Management:  Are you having any financial strains with the device, your supplies or your medication? No .  Does the patient want to be seen by Chronic Care Management for management of their diabetes?  No  Would the patient like to be referred to a Nutritionist or for Diabetic Management?  No   Diabetic Exams:  Diabetic Eye Exam: Completed 04/19/21 Diabetic Foot Exam: Completed 07/05/21   Interpreter Needed?: No  Information entered by :: Charlott Rakes, LPN   Activities of Daily Living    11/03/2021    9:23 AM 07/05/2021   10:01 AM  In your present state of health, do you have any difficulty performing the following activities:  Hearing? 1 1  Comment wears hearing aids hearing aids  Vision? 0 0  Difficulty concentrating or making decisions? 0 0  Walking or climbing stairs? 0 1  Comment  balance issues  Dressing or bathing? 0 0  Doing errands, shopping? 0 0  Preparing Food and eating ? N   Using the Toilet? N   In the past six months, have you accidently leaked urine? N   Do you have problems with loss of bowel control? N   Managing your Medications? N   Managing your Finances? N   Housekeeping or managing your Housekeeping? N     Patient Care Team: Midge Minium, MD as PCP - General (Family Medicine) Lonzo Candy, AUD (Audiology) Netta Cedars, MD as Consulting Physician (Orthopedic Surgery) Hollar, Katharine Look, MD as Referring Physician (Dermatology) Milus Banister, MD as Attending Physician (Gastroenterology) Charlaine Dalton, MD as Referring Physician (Pulmonary Disease) Calvert Cantor, MD as Consulting Physician (Ophthalmology) Joycie Peek, MD (General Practice) Amalia Greenhouse, MD as  Referring Physician (Endocrinology) Kathie Rhodes, MD (Inactive) as Consulting  Physician (Urology) Madelin Rear, [facility] as Pharmacist (Pharmacist)  Indicate any recent Medical Services you may have received from other than [facility] providers in the past year (date may be approximate).     Assessment:   This is a routine wellness examination for [place].  Hearing/Vision screen Hearing Screening - Comments:: Pt wears hearing aids  Vision Screening - Comments:: Pt follows up with Dr Bing Plume for annual eye exams   Dietary issues and exercise activities discussed: Current Exercise Habits: Home exercise routine, Type of exercise: walking;Other - see comments, Time (Minutes): 50, Frequency (Times/Week): 3, Weekly Exercise (Minutes/Week): 150   Goals Addressed             This Visit's Progress    Patient Stated       Work on upper body strength and yoga        Depression Screen    11/03/2021    9:19 AM 09/05/2021    3:24 PM 07/05/2021   10:06 AM 06/01/2021    1:22 PM 01/25/2021    1:31 PM 12/08/2020   11:00 AM 07/30/2020    1:08 PM  PHQ 2/9 Scores  PHQ - 2 Score 0 0 0 0 0 0 0  PHQ- 9 Score  0 0  0 0 0    Fall Risk    11/03/2021    9:23 AM 09/05/2021    3:24 PM 07/05/2021   10:06 AM 06/01/2021    1:22 PM 01/25/2021    1:30 PM  Fall Risk   Falls in the past year? 0 0 0 0 0  Number falls in past yr: 0 0  0 0  Injury with Fall? 0 0  0 0  Risk for fall due to : Impaired balance/gait No Fall Risks No Fall Risks No Fall Risks   Risk for fall due to: Comment balance concerns      Follow up Falls prevention discussed Falls evaluation completed Falls evaluation completed Falls evaluation completed Falls evaluation completed    [place]:  Any stairs in or around the home? No  If so, are there any without handrails? No  Home free of loose throw rugs in walkways, pet beds, electrical cords, etc? Yes  Adequate lighting in your home to reduce risk of falls? Yes   ASSISTIVE DEVICES UTILIZED TO PREVENT FALLS:  Life alert? Yes  Use of a  cane, walker or w/c? No  Grab bars in the bathroom? Yes  Shower chair or bench in shower? Yes  Elevated toilet seat or a handicapped toilet? Yes   TIMED UP AND GO:  Was the test performed? No .   Cognitive Function:        11/03/2021    9:26 AM  6CIT Screen  What Year? 0 points  What month? 0 points  What time? 0 points  Count back from 20 0 points  Months in reverse 0 points  Repeat phrase 0 points  Total Score 0 points    Immunizations Immunization History  Administered Date(s) Administered   Influenza Split 04/11/2011   Influenza, High Dose Seasonal PF 02/21/2019, 03/19/2020, 04/11/2021   Influenza,inj,Quad PF,6+ Mos 04/10/2013, 03/10/2014, 04/20/2015, 02/29/2016, 03/20/2017, 02/06/2018   Moderna Sars-Covid-2 Vaccination 07/29/2019, 08/12/2019, 11/02/2021   Pneumococcal Conjugate-13 08/27/2014   Pneumococcal Polysaccharide-23 04/19/2010   Tdap 04/20/2012   Zoster Recombinat (Shingrix) 04/15/2018, 07/15/2018    TDAP status: Up to date  Flu Vaccine status:  Up to date  Pneumococcal vaccine status: Up to date  Covid-19 vaccine status: Completed vaccines  Qualifies for Shingles Vaccine? Yes   Zostavax completed Yes   Shingrix Completed?: Yes  Screening Tests Health Maintenance  Topic Date Due   HEMOGLOBIN A1C  09/05/2021   COVID-19 Vaccine (4 - Booster) 12/28/2021   INFLUENZA VACCINE  01/17/2022   OPHTHALMOLOGY EXAM  04/19/2022   TETANUS/TDAP  04/20/2022   FOOT EXAM  07/05/2022   URINE MICROALBUMIN  07/05/2022   MAMMOGRAM  07/21/2022   Pneumonia Vaccine 72+ Years old  Completed   DEXA SCAN  Completed   Zoster Vaccines- Shingrix  Completed   HPV VACCINES  Aged Out    Health Maintenance  Health Maintenance Due  Topic Date Due   HEMOGLOBIN A1C  09/05/2021    Colorectal cancer screening: No longer required.   Mammogram status: Completed 07/21/21. Repeat every year  Bone Density status: Completed 07/19/20. Results reflect: Bone density results:  OSTEOPENIA. Repeat every 2 years.   Additional Screening:  Vision Screening: Recommended annual ophthalmology exams for early detection of glaucoma and other disorders of the eye. Is the patient up to date with their annual eye exam?  Yes  Who is the provider or what is the name of the office in which the patient attends annual eye exams? [facility]  If pt is not established with a provider, would they like to be referred to a provider to establish care? No .   Dental Screening: Recommended annual dental exams for proper oral hygiene  Community Resource Referral / Chronic Care Management: CRR required this visit?  No   CCM required this visit?  No      Plan:     I have personally reviewed and noted the following in the patient's chart:   Medical and social history Use of alcohol, tobacco or illicit drugs  Current medications and supplements including opioid prescriptions.  Functional ability and status Nutritional status Physical activity Advanced directives List of other physicians Hospitalizations, surgeries, and ER visits in previous 12 months Vitals Screenings to include cognitive, depression, and falls Referrals and appointments  In addition, I have reviewed and discussed with patient certain preventive protocols, quality metrics, and best practice recommendations. A written personalized care plan for preventive services as well as general preventive health recommendations were provided to patient.     Willette Brace, LPN   [id number]/51/[id number]   Nurse Notes: Pt stated she walks a lot , however does have some balance concerns please advise

## 2021-11-07 ENCOUNTER — Telehealth: Payer: Self-pay

## 2021-11-07 NOTE — Telephone Encounter (Signed)
Patient called in asking about PEPCID (did state she was not having any acid reflux issues) and montelukast (SINGULAIR) 10 MG tablet. Wanted to know if she should still be taking these medications and if so she is needing a refill on them.

## 2021-11-07 NOTE — Telephone Encounter (Signed)
Spoke w/ pt and advised of Dr Birdie Riddle response . Pt expressed verbal understanding

## 2021-11-07 NOTE — Telephone Encounter (Signed)
As long as she is not having issues for reflux/heartburn/indigestion she can stop the Pepcid.  The Singulair is an allergy/asthma medication.  If she is not having issues w/ allergy symptoms, she does not need to take it.

## 2021-11-16 ENCOUNTER — Encounter: Payer: Self-pay | Admitting: Gastroenterology

## 2021-11-16 ENCOUNTER — Ambulatory Visit (INDEPENDENT_AMBULATORY_CARE_PROVIDER_SITE_OTHER): Payer: Medicare Other | Admitting: Gastroenterology

## 2021-11-16 VITALS — BP 124/70 | HR 76 | Ht 63.0 in | Wt 145.2 lb

## 2021-11-16 DIAGNOSIS — K529 Noninfective gastroenteritis and colitis, unspecified: Secondary | ICD-10-CM | POA: Diagnosis not present

## 2021-11-16 NOTE — Patient Instructions (Addendum)
If you are age 82 or older, your body mass index should be between 23-30. Your Body mass index is 25.73 kg/m. If this is out of the aforementioned range listed, please consider follow up with your Primary Care Provider. ________________________________________________________  The [facility] providers would like to encourage you to use [facility] to communicate with providers for non-urgent requests or questions.  Due to long hold times on the telephone, sending your provider a message by [facility] may be a faster and more efficient way to get a response.  Please allow 48 business hours for a response.  Please remember that this is for non-urgent requests.  _______________________________________________________  Dennis Bast will need an appointment in 6 months.  We will contact you to schedule this appointment.  Thank you for entrusting me with your care and choosing [facility].  Dr Ardis Hughs

## 2021-11-16 NOTE — Progress Notes (Signed)
Review of pertinent gastrointestinal problems:  1. routine risk for colon cancer, no polyps on colonoscopy April 2013  2. microscopic, lymphocytic colitis noted on biopsies, colonoscopy April 2013 with normal colon, normal terminal ileum, random colon biopsies performed. Started on budesonide '9mg'$  a day.  2023 repeat budesonide trial did not improve her chronic diarrhea 3.  C. difficile 2019 also 2020 4.  Chronic diarrhea multifactorial including lymphocytic colitis, metformin use, extreme amounts of coffee every morning.  Postcholecystectomy .     HPI: This is a pleasant 81 year old woman  I last saw her here in the office about 3 months ago.  We discussed her multifactorial diarrhea (lymphocytic colitis, metformin use, extreme amounts of coffee, postcholecystectomy), I recommend a trial of daily scheduled single pill of Imodium.  Budesonide has not helped in the past.  Stool testing for C. difficile was negative 07/2020.  TSH was normal 06/2021  Her weight is down 13 pounds since her last office visit here 3 months ago, same scale  Today she tells me that she tried the Imodium 1 pill once daily and it did not help at all.  She completely stopped taking caffeinated coffee.  She was drinking 6 or 8 cups every morning.  She retry the budesonide even though previously she has told me it never helped.  She took 3 pills once daily and that caused constipation.  In the past 2 months she takes 1 budesonide pill every 3 days and she is very happy with her bowels on that regimen.  Normally she will have a single solid stool a day.  Sometimes she will have a slight bit of watery stools.  She is very happy with that.  She also related an experience with her [place] acupuncture Dr.  Apparently this provider cured her of wine allergies cat allergies, celiac sprue.  She spent a long time talking about the fact that this acupuncturist can cure an infant of medical problems while it is sitting on his mother's lap by  treating his mother.   ROS: complete GI ROS as described in HPI, all other review negative.  Constitutional:  No unintentional weight loss   Past Medical History:  Diagnosis Date   Allergic rhinitis    Asthma    Diabetes mellitus    just changed from injection to metformin ? month   Dysrhythmia    palpitations   GERD (gastroesophageal reflux disease)    Glaucoma    History of kidney stones    Hyperlipemia    Migraines    Palpitations    Pneumonia    hx   Urinary tract infection     Past Surgical History:  Procedure Laterality Date   ABDOMINAL HYSTERECTOMY     Has ovaries   BACK SURGERY  1987   lumb   BREAST SURGERY Left 07/2016   bx- needle in breast   CHOLECYSTECTOMY  09/26/13   CLOSED REDUCTION NASAL FRACTURE  04/29/2012   Procedure: CLOSED REDUCTION NASAL FRACTURE;  Surgeon: Jodi Marble, MD;  Location: [facility];  Service: ENT;  Laterality: N/A;  WITH STABILIZATION   COLONOSCOPY     Deviated septum repair     EYE SURGERY Bilateral    cataracts   FOOT SURGERY Bilateral    ingrown toe nails   LITHOTRIPSY     REVERSE SHOULDER ARTHROPLASTY Right 09/15/2016   Procedure: RIGHT REVERSE SHOULDER ARTHROPLASTY;  Surgeon: Netta Cedars, MD;  Location: [facility];  Service: Orthopedics;  Laterality: Right;   REVERSE  SHOULDER ARTHROPLASTY Left 10/15/2020   Procedure: REVERSE SHOULDER ARTHROPLASTY;  Surgeon: Netta Cedars, MD;  Location: [facility];  Service: Orthopedics;  Laterality: Left;   ROTATOR CUFF REPAIR     right   TONSILLECTOMY      Current Outpatient Medications  Medication Instructions   atorvastatin (LIPITOR) 20 MG tablet TAKE ONE (1) TABLET BY MOUTH EVERY DAY   Biotin 5,000 mcg, Oral, Daily   budesonide (ENTOCORT EC) 3 mg, Oral, Daily, 1 every 3 days    CALCIUM PO 1 capsule, Oral, Daily   citalopram (CELEXA) 40 MG tablet TAKE ONE (1) TABLET BY MOUTH EVERY DAY   fenofibrate (TRICOR) 145 MG tablet TAKE ONE TABLET BY MOUTH EVERY MORNING AFTER  BREAKFAST   ibuprofen (ADVIL) 200 mg, Oral, Every 6 hours PRN   LUMIGAN 0.01 % SOLN 1 drop, Both Eyes, Daily at bedtime   Melatonin 10 MG TABS Oral   montelukast (SINGULAIR) 10 MG tablet TAKE 1 TABLET BY MOUTH AT BEDTIME   Multiple Vitamins-Minerals (CENTRUM SILVER PO) 1 tablet, Oral, Daily    Allergies as of 11/16/2021 - Review Complete 11/16/2021  Allergen Reaction Noted   Papain Anaphylaxis 01/29/2012   Papaya derivatives Anaphylaxis 08/23/2011   Mushroom ext cmplx(shiitake-reishi-mait) Other (See Comments) 08/23/2011   Mushroom extract complex Other (See Comments) 11/18/2014   Venlafaxine Other (See Comments) 08/23/2011    Family History  Problem Relation Age of Onset   Diabetes Father    Colon cancer Neg Hx    Esophageal cancer Neg Hx    Rectal cancer Neg Hx    Stomach cancer Neg Hx    Breast cancer Neg Hx     Social History   Socioeconomic History   Marital status: Widowed    Spouse name: Not on file   Number of children: 2   Years of education: Not on file   Highest education level: Not on file  Occupational History   Occupation: Retired -Sports coach: RETIRED  Tobacco Use   Smoking status: Former    Packs/day: 0.10    Years: 4.00    Pack years: 0.40    Types: Cigarettes    Quit date: 06/19/1986    Years since quitting: 35.4   Smokeless tobacco: Never   Tobacco comments:    smoked in college years ago  Vaping Use   Vaping Use: Never used  Substance and Sexual Activity   Alcohol use: Yes    Comment: 1-2 glasses week variety beer occ scotch   Drug use: No   Sexual activity: Not Currently    Birth control/protection: Surgical  Other Topics Concern   Not on file  Social History Narrative   Daily caffeine    Social Determinants of Health   Financial Resource Strain: Low Risk    Difficulty of Paying Living Expenses: Not hard at all  Food Insecurity: No Food Insecurity   Worried About Charity fundraiser in the Last Year: Never true   [place] in the Last Year: Never true  Transportation Needs: No Transportation Needs   Lack of Transportation (Medical): No   Lack of Transportation (Non-Medical): No  Physical Activity: Sufficiently Active   Days of Exercise per Week: 3 days   Minutes of Exercise per Session: 50 min  Stress: No Stress Concern Present   Feeling of Stress : Not at all  Social Connections: Moderately Integrated   Frequency of Communication with Friends and Family: More than three times a  week   Frequency of Social Gatherings with Friends and Family: More than three times a week   Attends Religious Services: More than 4 times per year   Active Member of Clubs or Organizations: Yes   Attends Archivist Meetings: 1 to 4 times per year   Marital Status: Widowed  Human resources officer Violence: Not At Risk   Fear of Current or Ex-Partner: No   Emotionally Abused: No   Physically Abused: No   Sexually Abused: No     Physical Exam: BP 124/70   Pulse 76   Ht '5\' 3"'$  (1.6 m)   Wt 145 lb 4 oz (65.9 kg)   BMI 25.73 kg/m  Constitutional: generally well-appearing Psychiatric: alert and oriented x3 Abdomen: soft, nontender, nondistended, no obvious ascites, no peritoneal signs, normal bowel sounds No peripheral edema noted in lower extremities  Assessment and plan: 81 y.o. female with multifactorial diarrhea, improving  She has lymphocytic colitis, she is on Glucophage.  She was drinking way too many caffeinated beverages a day.  She has rechallenged with budesonide and she is taking 1 pill every 3 days and this seems to be controlling her chronic loose stools quite well.  She also significantly cut back on her caffeine.  She will return to see me in 6 months and sooner if any problems.  Please see the "Patient Instructions" section for addition details about the plan.  Owens Loffler, MD [facility] 11/16/2021, 9:08 AM   Total time on date of encounter was 25 minutes (this included  time spent preparing to see the patient reviewing records; obtaining and/or reviewing separately obtained history; performing a medically appropriate exam and/or evaluation; counseling and educating the patient and family if present; ordering medications, tests or procedures if applicable; and documenting clinical information in the health record).

## 2021-12-12 ENCOUNTER — Other Ambulatory Visit: Payer: Self-pay | Admitting: Family Medicine

## 2021-12-13 ENCOUNTER — Telehealth: Payer: Self-pay | Admitting: Family Medicine

## 2021-12-13 DIAGNOSIS — L988 Other specified disorders of the skin and subcutaneous tissue: Secondary | ICD-10-CM

## 2021-12-14 NOTE — Telephone Encounter (Signed)
Informed pt that the referral has been sent . I explained that it may take some time but Danae Chen or the dermatologist office will contact her with an apt .

## 2021-12-14 NOTE — Telephone Encounter (Signed)
Referral placed.  Unfortunately these can take quite awhile so don't get discouraged if you don't hear from someone right away.

## 2022-01-10 DIAGNOSIS — J45901 Unspecified asthma with (acute) exacerbation: Secondary | ICD-10-CM | POA: Diagnosis not present

## 2022-01-10 DIAGNOSIS — I700 Atherosclerosis of aorta: Secondary | ICD-10-CM | POA: Diagnosis not present

## 2022-01-10 DIAGNOSIS — I959 Hypotension, unspecified: Secondary | ICD-10-CM | POA: Diagnosis not present

## 2022-01-10 DIAGNOSIS — F1090 Alcohol use, unspecified, uncomplicated: Secondary | ICD-10-CM | POA: Diagnosis not present

## 2022-01-10 DIAGNOSIS — R0602 Shortness of breath: Secondary | ICD-10-CM | POA: Diagnosis not present

## 2022-01-11 ENCOUNTER — Encounter: Payer: Self-pay | Admitting: Family Medicine

## 2022-01-11 ENCOUNTER — Ambulatory Visit (INDEPENDENT_AMBULATORY_CARE_PROVIDER_SITE_OTHER): Payer: Medicare Other | Admitting: Family Medicine

## 2022-01-11 VITALS — BP 122/62 | HR 68 | Temp 97.3°F | Resp 17 | Ht 63.0 in | Wt 141.5 lb

## 2022-01-11 DIAGNOSIS — M5030 Other cervical disc degeneration, unspecified cervical region: Secondary | ICD-10-CM | POA: Diagnosis not present

## 2022-01-11 DIAGNOSIS — L819 Disorder of pigmentation, unspecified: Secondary | ICD-10-CM

## 2022-01-11 DIAGNOSIS — E119 Type 2 diabetes mellitus without complications: Secondary | ICD-10-CM | POA: Diagnosis not present

## 2022-01-11 DIAGNOSIS — R200 Anesthesia of skin: Secondary | ICD-10-CM

## 2022-01-11 LAB — TSH: TSH: 0.71 u[IU]/mL (ref 0.35–5.50)

## 2022-01-11 LAB — B12 AND FOLATE PANEL
Folate: >24.2 ng/mL (ref >5.9)
Vitamin B-12: 365 pg/mL (ref 211–911)

## 2022-01-11 LAB — BASIC METABOLIC PANEL
BUN: 31 mg/dL — ABNORMAL HIGH (ref 6–23)
CO2: 25 mEq/L (ref 19–32)
Calcium: 10.5 mg/dL (ref 8.4–10.5)
Chloride: 103 mEq/L (ref 96–112)
Creatinine, Ser: 0.92 mg/dL (ref 0.40–1.20)
GFR: 58.1 mL/min — ABNORMAL LOW (ref >60.00)
Glucose, Bld: 153 mg/dL — ABNORMAL HIGH (ref 70–99)
Potassium: 4.1 mEq/L (ref 3.5–5.1)
Sodium: 136 mEq/L (ref 135–145)

## 2022-01-11 LAB — HEMOGLOBIN A1C: Hgb A1c MFr Bld: 6.4 % (ref 4.6–6.5)

## 2022-01-11 NOTE — Progress Notes (Signed)
   Subjective:    Patient ID: [name], female    DOB: 01-Jan-1941, 81 y.o.   MRN: [id number]  HPI Hand numbness- R handed.  pt reports L hand will swell and she is feeling like she is losing strength.  Hand numbness started 3-4 months ago.  Pt reports if she is driving or has arms elevated for longer than 20 minutes, hands start to hurt and go numb.  Hands will burn.  Numbness and pain seems to only occur w/ arms up positions.  Facial discoloration- pt has some hyperpigmented areas on cheeks bilaterally.  Would like dermatology referral.   Review of Systems For ROS see HPI     Objective:   Physical Exam Vitals reviewed.  Constitutional:      General: She is not in acute distress.    Appearance: Normal appearance. She is not ill-appearing.  HENT:     Head: Normocephalic and atraumatic.  Cardiovascular:     Pulses: Normal pulses.  Musculoskeletal:        General: No swelling (no obvious swelling of L hand) or tenderness.     Cervical back: Normal range of motion and neck supple.     Right lower leg: No edema.     Left lower leg: No edema.  Skin:    General: Skin is warm and dry.     Comments: Very mild facial hyperpigmentation  Neurological:     General: No focal deficit present.     Mental Status: She is alert and oriented to person, place, and time.     Motor: No weakness.     Gait: Gait normal.  Psychiatric:        Mood and Affect: Mood normal.        Thought Content: Thought content normal.           Assessment & Plan:   Bilateral hand numbness- new.  Per pt report this appears to be positional- when driving or with arms overhead.  This could indicate an issue at the neck.  Will get Cspine films to assess for disc narrowing or spurring.  Will check labs to r/o underlying metabolic causes.  If xrays indicate a Cspine issue will refer to Neurosurg.  If nothing obvious on films, will refer to Neuro for complete evaluation.  Pt expressed understanding and is in  agreement w/ plan.   Hyperpigmentation- new.  Pt would like to see a new dermatologist as she had a bad experience w/ last provider.  Referral placed.

## 2022-01-11 NOTE — Patient Instructions (Signed)
Follow up as needed or as scheduled We'll notify you of your lab results and make any changes if needed Go to the [facility] on [address] and get your [employer] call you with your Dermatology appt (a different Dermatologist) Call with any questions or concerns Hang in there!!!

## 2022-01-12 ENCOUNTER — Ambulatory Visit (HOSPITAL_BASED_OUTPATIENT_CLINIC_OR_DEPARTMENT_OTHER)
Admission: RE | Admit: 2022-01-12 | Discharge: 2022-01-12 | Disposition: A | Discharge status: Home / Self Care | Payer: Medicare Other | Source: Ambulatory Visit | Attending: Family Medicine | Admitting: Family Medicine

## 2022-01-12 ENCOUNTER — Telehealth: Payer: Self-pay | Admitting: Family Medicine

## 2022-01-12 DIAGNOSIS — R200 Anesthesia of skin: Secondary | ICD-10-CM | POA: Insufficient documentation

## 2022-01-12 LAB — CBC WITH DIFFERENTIAL/PLATELET
Basophils Absolute: 0 10*3/uL (ref 0.0–0.1)
Basophils Relative: 0.3 % (ref 0.0–3.0)
Eosinophils Absolute: 0 10*3/uL (ref 0.0–0.7)
Eosinophils Relative: 0.1 % (ref 0.0–5.0)
HCT: 35.8 % — ABNORMAL LOW (ref 36.0–46.0)
Hemoglobin: 11.9 g/dL — ABNORMAL LOW (ref 12.0–15.0)
Lymphocytes Relative: 6.8 % — ABNORMAL LOW (ref 12.0–46.0)
Lymphs Abs: 0.8 10*3/uL (ref 0.7–4.0)
MCHC: 33.4 g/dL (ref 30.0–36.0)
MCV: 89.3 fl (ref 78.0–100.0)
Monocytes Absolute: 0.2 10*3/uL (ref 0.1–1.0)
Monocytes Relative: 1.4 % — ABNORMAL LOW (ref 3.0–12.0)
Neutro Abs: 11.1 10*3/uL — ABNORMAL HIGH (ref 1.4–7.7)
Neutrophils Relative %: 91.4 % — ABNORMAL HIGH (ref 43.0–77.0)
Platelets: 271 10*3/uL (ref 150.0–400.0)
RBC: 4.01 Mil/uL (ref 3.87–5.11)
RDW: 13.5 % (ref 11.5–15.5)
WBC: 12.1 10*3/uL — ABNORMAL HIGH (ref 4.0–10.5)

## 2022-01-12 NOTE — Assessment & Plan Note (Signed)
Chronic problem.  Appears she has not seen Endo recently.  Not currently on medication.  Given her hand numbness, will get A1C to assess for diabetic control.  Pt expressed understanding and is in agreement w/ plan.

## 2022-01-12 NOTE — Telephone Encounter (Signed)
.  Caller name: Sawsan (pt)  On DPR? :yes/no: Yes  Call back number: [phone]  Provider they see: Dr. Birdie Riddle  Reason for call: Pt got a mychart message about her lab work but she is unable to view due to computer issues.  Pt would like a about her lab work instead.

## 2022-01-12 NOTE — Telephone Encounter (Signed)
Dr Birdie Riddle has not reviewed the results . Once she does I will contact the pt

## 2022-01-13 ENCOUNTER — Telehealth: Payer: Self-pay | Admitting: Family Medicine

## 2022-01-13 ENCOUNTER — Telehealth: Payer: Self-pay

## 2022-01-13 ENCOUNTER — Other Ambulatory Visit: Payer: Self-pay

## 2022-01-13 DIAGNOSIS — D72829 Elevated white blood cell count, unspecified: Secondary | ICD-10-CM

## 2022-01-13 NOTE — Telephone Encounter (Signed)
Spoke to pt and advised she can try some OTC chloraseptic spray for the throat . If she develops a fever or gets worse advised to see Urgent Care . PT expressed verbal understanding

## 2022-01-13 NOTE — Telephone Encounter (Signed)
Pt called states that her throat is really bothering her. Pt want to know if Dr.Tabori can call her something to [facility]. Best contact for pt is [phone].

## 2022-01-13 NOTE — Addendum Note (Signed)
Addended by: Midge Minium on: 01/13/2022 12:49 PM   Modules accepted: Orders

## 2022-01-13 NOTE — Telephone Encounter (Signed)
-----   Message from Midge Minium, MD sent at 01/13/2022  7:39 AM EDT ----- A1C looks great at 6.4%!!!  Keep up the good work!  Your blood count shows a high WBC (white blood cell count) and high neutrophils (a particular type of white blood cell linked w/ infection of inflammation).  We will repeat your CBC at a lab only visit next week (dx elevated WBC)  Remainder of labs look great!

## 2022-01-13 NOTE — Telephone Encounter (Signed)
Informed pt /of lab results and her lab visit is scheduled or / and order is in

## 2022-01-16 ENCOUNTER — Telehealth: Payer: Self-pay | Admitting: Family Medicine

## 2022-01-16 DIAGNOSIS — R062 Wheezing: Secondary | ICD-10-CM | POA: Diagnosis not present

## 2022-01-16 DIAGNOSIS — J40 Bronchitis, not specified as acute or chronic: Secondary | ICD-10-CM | POA: Diagnosis not present

## 2022-01-16 NOTE — Telephone Encounter (Signed)
Unfortunately she would need an appt to be evaluated and make sure she is being treated appropriately before something is just called in

## 2022-01-16 NOTE — Telephone Encounter (Signed)
Spoke w/ and advised that she needed an apt to be seen . She states she is seeing the NP on site at her [facility]  @ 2pm

## 2022-01-16 NOTE — Telephone Encounter (Signed)
Caller name: Prince Rome   On DPR? :yes/no: Yes  Call back number:[phone]  Provider they see: Birdie Riddle   Reason for call:  Pt states that she had a asthma attack three days ago. Pt states she has a sore throat and congestion in her chest. I tried to schedule a same day appt. Pt sates that she don't how to work her phone and no transportation. Pt wants to know if Birdie Riddle can send in her an antibiotic to  [facility] in [place].

## 2022-01-18 ENCOUNTER — Other Ambulatory Visit: Payer: Medicare Other

## 2022-01-19 DIAGNOSIS — J40 Bronchitis, not specified as acute or chronic: Secondary | ICD-10-CM | POA: Diagnosis not present

## 2022-01-24 DIAGNOSIS — M9905 Segmental and somatic dysfunction of pelvic region: Secondary | ICD-10-CM | POA: Diagnosis not present

## 2022-01-24 DIAGNOSIS — M9901 Segmental and somatic dysfunction of cervical region: Secondary | ICD-10-CM | POA: Diagnosis not present

## 2022-01-24 DIAGNOSIS — M542 Cervicalgia: Secondary | ICD-10-CM | POA: Diagnosis not present

## 2022-01-24 DIAGNOSIS — M546 Pain in thoracic spine: Secondary | ICD-10-CM | POA: Diagnosis not present

## 2022-01-24 DIAGNOSIS — M9903 Segmental and somatic dysfunction of lumbar region: Secondary | ICD-10-CM | POA: Diagnosis not present

## 2022-01-24 DIAGNOSIS — M9902 Segmental and somatic dysfunction of thoracic region: Secondary | ICD-10-CM | POA: Diagnosis not present

## 2022-01-24 DIAGNOSIS — M5126 Other intervertebral disc displacement, lumbar region: Secondary | ICD-10-CM | POA: Diagnosis not present

## 2022-01-27 ENCOUNTER — Other Ambulatory Visit: Payer: Medicare Other

## 2022-01-27 DIAGNOSIS — M5451 Vertebrogenic low back pain: Secondary | ICD-10-CM | POA: Diagnosis not present

## 2022-01-27 DIAGNOSIS — M9901 Segmental and somatic dysfunction of cervical region: Secondary | ICD-10-CM | POA: Diagnosis not present

## 2022-01-27 DIAGNOSIS — M9902 Segmental and somatic dysfunction of thoracic region: Secondary | ICD-10-CM | POA: Diagnosis not present

## 2022-01-27 DIAGNOSIS — M5126 Other intervertebral disc displacement, lumbar region: Secondary | ICD-10-CM | POA: Diagnosis not present

## 2022-01-27 DIAGNOSIS — M9903 Segmental and somatic dysfunction of lumbar region: Secondary | ICD-10-CM | POA: Diagnosis not present

## 2022-01-27 DIAGNOSIS — M542 Cervicalgia: Secondary | ICD-10-CM | POA: Diagnosis not present

## 2022-01-27 DIAGNOSIS — M9905 Segmental and somatic dysfunction of pelvic region: Secondary | ICD-10-CM | POA: Diagnosis not present

## 2022-01-27 DIAGNOSIS — M546 Pain in thoracic spine: Secondary | ICD-10-CM | POA: Diagnosis not present

## 2022-01-28 DIAGNOSIS — K5900 Constipation, unspecified: Secondary | ICD-10-CM | POA: Diagnosis not present

## 2022-01-30 ENCOUNTER — Telehealth: Payer: Self-pay | Admitting: Family Medicine

## 2022-01-30 NOTE — Telephone Encounter (Signed)
Increase Miralax to twice daily until having regular BMs.  Continue increased water intake.  Coffee is ok to drink in the mornings as it can help people use the bathroom.

## 2022-01-30 NOTE — Telephone Encounter (Signed)
I have advised pt of Dr Birdie Riddle message .

## 2022-01-30 NOTE — Telephone Encounter (Signed)
Patient needs appointment if she has not had relief

## 2022-01-30 NOTE — Telephone Encounter (Signed)
Caller name: Johann (pt)  On DPR? :yes/no: Yes  Call back number: [phone]  Provider they see: Birdie Riddle  Reason for call: Trouble having bowel movements; has been a few days. Went to urgent care on Saturday, was Rx'd miralax and told to drink fluids. Still not able to have a BM.  Wants to know what else she can do or needs to do.

## 2022-01-30 NOTE — Telephone Encounter (Signed)
Caller name: [name] (pt)  On DPR? :yes/no: Yes  Call back number: [phone]  Provider they see: Birdie Riddle  Reason for call: Trouble having bowel movements; has been a few days. Went to urgent care on Saturday, was Rx'd miralax and told to drink fluids. Still not able to have a BM.  Wants to know what else she can do or needs to do.

## 2022-01-31 ENCOUNTER — Encounter: Payer: Self-pay | Admitting: Family Medicine

## 2022-01-31 ENCOUNTER — Ambulatory Visit (INDEPENDENT_AMBULATORY_CARE_PROVIDER_SITE_OTHER): Payer: Medicare Other | Admitting: Family Medicine

## 2022-01-31 ENCOUNTER — Telehealth: Payer: Self-pay | Admitting: Gastroenterology

## 2022-01-31 ENCOUNTER — Ambulatory Visit (HOSPITAL_BASED_OUTPATIENT_CLINIC_OR_DEPARTMENT_OTHER)
Admission: RE | Admit: 2022-01-31 | Discharge: 2022-01-31 | Disposition: A | Discharge status: Home / Self Care | Payer: Medicare Other | Source: Ambulatory Visit | Attending: Family Medicine | Admitting: Family Medicine

## 2022-01-31 VITALS — BP 138/78 | HR 97 | Temp 97.3°F | Resp 20 | Ht 63.0 in | Wt 143.8 lb

## 2022-01-31 DIAGNOSIS — D72829 Elevated white blood cell count, unspecified: Secondary | ICD-10-CM | POA: Diagnosis not present

## 2022-01-31 DIAGNOSIS — R194 Change in bowel habit: Secondary | ICD-10-CM | POA: Diagnosis not present

## 2022-01-31 DIAGNOSIS — Z9049 Acquired absence of other specified parts of digestive tract: Secondary | ICD-10-CM | POA: Diagnosis not present

## 2022-01-31 DIAGNOSIS — K5900 Constipation, unspecified: Secondary | ICD-10-CM | POA: Insufficient documentation

## 2022-01-31 DIAGNOSIS — M160 Bilateral primary osteoarthritis of hip: Secondary | ICD-10-CM | POA: Diagnosis not present

## 2022-01-31 DIAGNOSIS — I878 Other specified disorders of veins: Secondary | ICD-10-CM | POA: Diagnosis not present

## 2022-01-31 LAB — CBC WITH DIFFERENTIAL/PLATELET
Basophils Absolute: 0.1 10*3/uL (ref 0.0–0.1)
Basophils Relative: 0.9 % (ref 0.0–3.0)
Eosinophils Absolute: 0.2 10*3/uL (ref 0.0–0.7)
Eosinophils Relative: 3.1 % (ref 0.0–5.0)
HCT: 41.1 % (ref 36.0–46.0)
Hemoglobin: 13.6 g/dL (ref 12.0–15.0)
Lymphocytes Relative: 30 % (ref 12.0–46.0)
Lymphs Abs: 2 10*3/uL (ref 0.7–4.0)
MCHC: 33.1 g/dL (ref 30.0–36.0)
MCV: 89.5 fl (ref 78.0–100.0)
Monocytes Absolute: 0.4 10*3/uL (ref 0.1–1.0)
Monocytes Relative: 6.6 % (ref 3.0–12.0)
Neutro Abs: 4 10*3/uL (ref 1.4–7.7)
Neutrophils Relative %: 59.4 % (ref 43.0–77.0)
Platelets: 276 10*3/uL (ref 150.0–400.0)
RBC: 4.59 Mil/uL (ref 3.87–5.11)
RDW: 13.6 % (ref 11.5–15.5)
WBC: 6.7 10*3/uL (ref 4.0–10.5)

## 2022-01-31 NOTE — Telephone Encounter (Signed)
Patient called states she has blockage for about 3 days now had an xray done today is seeking advise. Stated she will wait for a call if possible by the end of the day.

## 2022-01-31 NOTE — Patient Instructions (Addendum)
Go to [facility] on [address] to get your xrays done Continue to drink LOTS of water INCREASE the fiber in your diet- spinach, leafy greens, broccoli, etc Continue the Dulcolax until you have a bowel movement- 2 tabs twice daily Do 1 capful of Miralax twice daily Call Dr Ardis Hughs office and let them know what's going on- they may want to see you Call with any questions or concerns Hang in there!!!

## 2022-01-31 NOTE — Progress Notes (Signed)
   Subjective:    Patient ID: [name], female    DOB: 01-22-1941, 81 y.o.   MRN: [id number]  HPI Constipation- pt states she has not had a BM in 4 days.  Went to [facility] on 8/12 and was prescribed Miralax clean out.  KUB was unremarkable.  Pt reports she did whole bottle of Miralax in Lemonade x2 and 'lots of water comes out but nothing else'.  Pt reports this happened to her last year when she had COVID but at that time she wasn't eating.  Has used 2 Fleets enemas, a suppository, 3 Dulcolax w/o results.  Ate a bowl of spinach 3 days ago w/ some relief.  Pt has not had a meal in 3 days.  Pt reports some abd pain/cramping at times but no N/V.  Overall feeling well just concerned w/ lack of bowel movements.   Review of Systems For ROS see HPI     Objective:   Physical Exam Vitals reviewed.  Constitutional:      General: She is not in acute distress.    Appearance: Normal appearance. She is not ill-appearing.  HENT:     Head: Normocephalic and atraumatic.  Eyes:     Extraocular Movements: Extraocular movements intact.     Conjunctiva/sclera: Conjunctivae normal.     Pupils: Pupils are equal, round, and reactive to light.  Cardiovascular:     Rate and Rhythm: Normal rate and regular rhythm.  Pulmonary:     Effort: Pulmonary effort is normal. No respiratory distress.     Breath sounds: Normal breath sounds. No wheezing.  Abdominal:     General: There is no distension.     Palpations: Abdomen is soft.     Tenderness: There is no abdominal tenderness. There is no guarding or rebound.     Comments: Hyperactive BS  Musculoskeletal:     Cervical back: Normal range of motion and neck supple.  Skin:    General: Skin is warm and dry.  Neurological:     General: No focal deficit present.     Mental Status: She is alert and oriented to person, place, and time.  Psychiatric:        Mood and Affect: Mood normal.        Behavior: Behavior normal.        Thought Content: Thought  content normal.           Assessment & Plan:  Obstipation- new.  Pt reports she has not had a BM in at least 4 days.  Went to [facility] on 8/12 and KUB was unremarkable.  Pt has stopped eating which is likely not helping her inability to have a BM.  She has been using Miralax, Dulcolax, Fleets enemas w/o relief- 'just a lot of water'.  Bowl of spinach the other night provided some relief.  Will get 2 view abd films to assess amount of stool present.  Encouraged Dulcolax BID and Miralax BID until BM.  Also told her to eat regularly.  Pt expressed understanding and is in agreement w/ plan.

## 2022-02-01 NOTE — Telephone Encounter (Signed)
Left message on machine to call back  

## 2022-02-01 NOTE — Progress Notes (Signed)
Left pt a vm in regards to her lab results

## 2022-02-02 NOTE — Telephone Encounter (Signed)
Left message on machine to call back   I see that the xray ordered by PCP was normal.

## 2022-02-03 ENCOUNTER — Emergency Department (HOSPITAL_BASED_OUTPATIENT_CLINIC_OR_DEPARTMENT_OTHER): Payer: Medicare Other

## 2022-02-03 ENCOUNTER — Encounter (HOSPITAL_BASED_OUTPATIENT_CLINIC_OR_DEPARTMENT_OTHER): Payer: Self-pay | Admitting: Pharmacy Technician

## 2022-02-03 ENCOUNTER — Emergency Department (HOSPITAL_BASED_OUTPATIENT_CLINIC_OR_DEPARTMENT_OTHER)
Admission: EM | Admit: 2022-02-03 | Discharge: 2022-02-03 | Disposition: A | Discharge status: Home / Self Care | Payer: Medicare Other | Attending: Emergency Medicine | Admitting: Emergency Medicine

## 2022-02-03 ENCOUNTER — Telehealth: Payer: Self-pay | Admitting: Family Medicine

## 2022-02-03 ENCOUNTER — Other Ambulatory Visit: Payer: Self-pay

## 2022-02-03 DIAGNOSIS — R140 Abdominal distension (gaseous): Secondary | ICD-10-CM | POA: Diagnosis present

## 2022-02-03 DIAGNOSIS — R109 Unspecified abdominal pain: Secondary | ICD-10-CM | POA: Diagnosis not present

## 2022-02-03 DIAGNOSIS — K5904 Chronic idiopathic constipation: Secondary | ICD-10-CM

## 2022-02-03 DIAGNOSIS — K591 Functional diarrhea: Secondary | ICD-10-CM | POA: Diagnosis not present

## 2022-02-03 DIAGNOSIS — K5900 Constipation, unspecified: Secondary | ICD-10-CM | POA: Diagnosis not present

## 2022-02-03 DIAGNOSIS — R197 Diarrhea, unspecified: Secondary | ICD-10-CM | POA: Diagnosis not present

## 2022-02-03 LAB — COMPREHENSIVE METABOLIC PANEL
ALT: 16 U/L (ref 0–44)
AST: 19 U/L (ref 15–41)
Albumin: 3.9 g/dL (ref 3.5–5.0)
Alkaline Phosphatase: 39 U/L (ref 38–126)
Anion gap: 7 (ref 5–15)
BUN: 20 mg/dL (ref 8–23)
CO2: 26 mmol/L (ref 22–32)
Calcium: 9.5 mg/dL (ref 8.9–10.3)
Chloride: 105 mmol/L (ref 98–111)
Creatinine, Ser: 0.71 mg/dL (ref 0.44–1.00)
GFR, Estimated: >60 mL/min (ref >60)
Glucose, Bld: 101 mg/dL — ABNORMAL HIGH (ref 70–99)
Potassium: 4.1 mmol/L (ref 3.5–5.1)
Sodium: 138 mmol/L (ref 135–145)
Total Bilirubin: 0.4 mg/dL (ref 0.3–1.2)
Total Protein: 6.3 g/dL — ABNORMAL LOW (ref 6.5–8.1)

## 2022-02-03 LAB — CBC WITH DIFFERENTIAL/PLATELET
Abs Immature Granulocytes: 0.03 10*3/uL (ref 0.00–0.07)
Basophils Absolute: 0 10*3/uL (ref 0.0–0.1)
Basophils Relative: 1 %
Eosinophils Absolute: 0.2 10*3/uL (ref 0.0–0.5)
Eosinophils Relative: 4 %
HCT: 34.1 % — ABNORMAL LOW (ref 36.0–46.0)
Hemoglobin: 11.4 g/dL — ABNORMAL LOW (ref 12.0–15.0)
Immature Granulocytes: 1 %
Lymphocytes Relative: 32 %
Lymphs Abs: 1.5 10*3/uL (ref 0.7–4.0)
MCH: 29.6 pg (ref 26.0–34.0)
MCHC: 33.4 g/dL (ref 30.0–36.0)
MCV: 88.6 fL (ref 80.0–100.0)
Monocytes Absolute: 0.4 10*3/uL (ref 0.1–1.0)
Monocytes Relative: 8 %
Neutro Abs: 2.5 10*3/uL (ref 1.7–7.7)
Neutrophils Relative %: 54 %
Platelets: 239 10*3/uL (ref 150–400)
RBC: 3.85 MIL/uL — ABNORMAL LOW (ref 3.87–5.11)
RDW: 13.4 % (ref 11.5–15.5)
WBC: 4.6 10*3/uL (ref 4.0–10.5)
nRBC: 0 % (ref 0.0–0.2)

## 2022-02-03 LAB — URINALYSIS, ROUTINE W REFLEX MICROSCOPIC
Bilirubin Urine: NEGATIVE
Glucose, UA: NEGATIVE mg/dL
Hgb urine dipstick: NEGATIVE
Ketones, ur: NEGATIVE mg/dL
Leukocytes,Ua: NEGATIVE
Nitrite: NEGATIVE
Protein, ur: NEGATIVE mg/dL
Specific Gravity, Urine: 1.026 (ref 1.005–1.030)
pH: 5 (ref 5.0–8.0)

## 2022-02-03 LAB — LIPASE, BLOOD: Lipase: 46 U/L (ref 11–51)

## 2022-02-03 MED ORDER — IOHEXOL 300 MG/ML  SOLN
100.0000 mL | Freq: Once | INTRAMUSCULAR | Status: AC | PRN
  Administered 2022-02-03: 100 mL via INTRAVENOUS

## 2022-02-03 NOTE — Telephone Encounter (Signed)
Left pt a detailed VM stating Dr Birdie Riddle message .

## 2022-02-03 NOTE — Telephone Encounter (Signed)
Caller name: Prince Rome   On DPR? :yes/no: Yes  Call back [phone]  Provider they see: Birdie Riddle   Reason for call: Pt states she still having trouble with her bowels. She still haven't had a bowel movement. Pt also states that her stomach and pelvis is being to hurt. She didn't know what to. She understands that the x-ray doesn't  show any blockage but she still can't using the bathroom. She states that it feel like her stomach is going to pop because she is so bloated.

## 2022-02-03 NOTE — Discharge Instructions (Signed)
You were evaluated in the emergency department for constipation and diarrhea.  The results of your evaluation did not show serious causes of constipation like an intra-abdominal tumor or bowel obstruction.  Your laboratory results were normal, indicating that you are not dehydrated or malnourished.  Your physical exam was normal and there was no stool to remove from your rectum.  I recommend continuing MiraLAX and resuming a normal diet.  Please follow-up with your primary care doctor and gastroenterologist after you leave the hospital.  Please return to the emergency department if you experience the following signs and symptoms of severe illness including, but not limited to: New or worsening abdominal pain, intractable nausea and vomiting, fevers greater than 100.4 F, blood in stool or vomit.

## 2022-02-03 NOTE — Telephone Encounter (Signed)
Pt needs to call Dr Ardis Hughs (GI) and see if they have any suggestions.  Is she eating regularly?  If she's not eating, there won't be anything to pass.  She should continue the Miralax twice daily and the Dulcolax twice daily until Dr Ardis Hughs says otherwise.  She can also try another enema.  If no improvement or sxs worsen over the weekend, she will need to go to the ER for complete evaluation

## 2022-02-03 NOTE — Telephone Encounter (Signed)
I see that the pt is currently at the ED.

## 2022-02-03 NOTE — ED Provider Notes (Signed)
[facility] EMERGENCY DEPT Provider Note   CSN: [id number] Arrival date & time: 02/03/22  0855     History  Chief Complaint  Patient presents with   Constipation    [name] is a 81 y.o. female who presents with 10 days of constipation.  Symptoms started after she took antibiotics for a URI.  She has been evaluated twice in the outpatient setting for this problem.  Has been advised to take MiraLAX and push fluids with a high-fiber diet.  These interventions have not been helpful.  She is also tried Fleet enemas to no avail.  Last bowel movement was yesterday.  It was small volume liquid stool.  She has to strain a lot to pass stool.  She is passing lots of flatus and belching, which is unusual for her.  She complains of bloating and mild abdominal pain around bilateral hips and pelvis.  She complains of some nausea but no vomiting.  Review of systems negative for fevers, chest pain, shortness of breath, blood in stool, dysuria, changes to frequency of urination.  Medical history: Iron deficiency Chronic diarrhea Lymphocytic colitis Last colonoscopy 2013  Surgical history: Cholecystectomy Hysterectomy for heavy menses   Constipation      Home Medications Prior to Admission medications   Medication Sig Start Date End Date Taking? Authorizing Provider  atorvastatin (LIPITOR) 20 MG tablet TAKE ONE (1) TABLET BY MOUTH EVERY DAY 12/12/21   Midge Minium, MD  Biotin 5000 MCG CAPS Take 5,000 mcg by mouth daily.    [provider]  budesonide (ENTOCORT EC) 3 MG 24 hr capsule Take 3 mg by mouth daily. 1 every 3 days    [provider]  CALCIUM PO Take 1 capsule by mouth daily.    [provider]  citalopram (CELEXA) 40 MG tablet TAKE ONE (1) TABLET BY MOUTH EVERY DAY 09/22/21   Midge Minium, MD  fenofibrate (TRICOR) 145 MG tablet TAKE ONE TABLET BY MOUTH EVERY MORNING AFTER BREAKFAST 09/22/21   Midge Minium, MD  ibuprofen  (ADVIL) 200 MG tablet Take 200 mg by mouth every 6 (six) hours as needed.    [provider]  LUMIGAN 0.01 % SOLN Place 1 drop into both eyes at bedtime.  09/07/14   [provider]  Melatonin 10 MG TABS Take by mouth.    [provider]  montelukast (SINGULAIR) 10 MG tablet TAKE 1 TABLET BY MOUTH AT BEDTIME 03/31/21   Midge Minium, MD  Multiple Vitamins-Minerals (CENTRUM SILVER PO) Take 1 tablet by mouth daily.    [provider]      Allergies    Papain, Papaya derivatives, Mushroom ext cmplx(shiitake-reishi-mait), Mushroom extract complex, and Venlafaxine    Review of Systems   Review of Systems  Gastrointestinal:  Positive for constipation.  All other systems reviewed and are negative.   Physical Exam Updated Vital Signs BP 122/60 (BP Location: Right Arm)   Pulse 74   Temp 98.1 F (36.7 C) (Oral)   Resp 16   SpO2 96%  Physical Exam Vitals and nursing note reviewed. Exam conducted with a chaperone present.  Constitutional:      General: She is not in acute distress.    Appearance: She is well-developed.  HENT:     Head: Normocephalic and atraumatic.  Eyes:     Conjunctiva/sclera: Conjunctivae normal.  Cardiovascular:     Rate and Rhythm: Normal rate and regular rhythm.     Heart sounds: No  murmur heard. Pulmonary:     Effort: Pulmonary effort is normal. No respiratory distress.     Breath sounds: Normal breath sounds.  Abdominal:     Palpations: Abdomen is soft.     Tenderness: There is no abdominal tenderness.  Genitourinary:    Rectum: Normal.     Comments: No stool in the rectal vault. Musculoskeletal:        General: No swelling.     Cervical back: Neck supple.  Skin:    General: Skin is warm and dry.     Capillary Refill: Capillary refill takes less than 2 seconds.  Neurological:     Mental Status: She is alert.  Psychiatric:        Mood and Affect: Mood normal.     ED Results / Procedures / Treatments    Labs (all labs ordered are listed, but only abnormal results are displayed) Labs Reviewed  CBC WITH DIFFERENTIAL/PLATELET - Abnormal; Notable for the following components:      Result Value   RBC 3.85 (*)    Hemoglobin 11.4 (*)    HCT 34.1 (*)    All other components within normal limits  COMPREHENSIVE METABOLIC PANEL - Abnormal; Notable for the following components:   Glucose, Bld 101 (*)    Total Protein 6.3 (*)    All other components within normal limits  URINALYSIS, ROUTINE W REFLEX MICROSCOPIC - Abnormal; Notable for the following components:   Color, Urine COLORLESS (*)    All other components within normal limits  LIPASE, BLOOD    EKG None  Radiology CT ABDOMEN PELVIS W CONTRAST  Result Date: 02/03/2022 CLINICAL DATA:  Abdominal pain and bloating.  Constipation. EXAM: CT ABDOMEN AND PELVIS WITH CONTRAST TECHNIQUE: Multidetector CT imaging of the abdomen and pelvis was performed using the standard protocol following bolus administration of intravenous contrast. RADIATION DOSE REDUCTION: This exam was performed according to the departmental dose-optimization program which includes automated exposure control, adjustment of the mA and/or kV according to patient size and/or use of iterative reconstruction technique. CONTRAST:  161m OMNIPAQUE IOHEXOL 300 MG/ML  SOLN COMPARISON:  Plain films of 01/31/2022. CT from [facility] of 02/17/2019 FINDINGS: Lower chest: 3 mm right lower lobe pulmonary nodule on 03/04 is similar to the prior and considered benign. Subsegmental atelectasis in the left lung base. Normal heart size without pericardial or pleural effusion. Hepatobiliary: Subcentimeter right hepatic dome cyst. Focal steatosis adjacent the falciform ligament. Cholecystectomy, without biliary ductal dilatation. Pancreas: Normal, without mass or ductal dilatation. Spleen: Normal in size, without focal abnormality. Adrenals/Urinary Tract: Normal adrenal glands. Normal kidneys,  without hydronephrosis. Normal urinary bladder. Stomach/Bowel: Gastric antral underdistention. Normal colon, appendix, and terminal ileum. Normal small bowel. Vascular/Lymphatic: Aortic atherosclerosis. No abdominopelvic adenopathy. Reproductive: Hysterectomy.  No adnexal mass. Other: No significant free fluid. Mild pelvic floor laxity. No free intraperitoneal air. Musculoskeletal: Right acetabular bone island is present on the prior. Degenerate disc disease is advanced at L3-4. IMPRESSION: 1.  No acute process or explanation for abdominal pain/bloating. 2.  Aortic Atherosclerosis (ICD10-I70.0). Electronically Signed   By: KAbigail MiyamotoM.D.   On: 02/03/2022 12:10    Medications Ordered in ED Medications  iohexol (OMNIPAQUE) 300 MG/ML solution 100 mL (100 mLs Intravenous Contrast Given 02/03/22 1154)    ED Course/ Medical Decision Making/ A&P Clinical Course as of 02/03/22 1451  Fri Feb 03, 2022  1043 Lipase: 46 [MM]  1043 CMP within normal limits. [MM]  1044 Hemoglobin(!): 11.4 Patient does have a  history of iron deficiency anemia with recent hemoglobin near 11. [MM]  1215 CT ABDOMEN PELVIS W CONTRAST CT scan without distention, evidence of obstruction, or other findings to explain the patient's current clinical syndrome. [MM]  1410 UA within normal limits. [MM]  7858 Will perform rectal exam and digital disimpaction if necessary. [MM]    Clinical Course User Index [MM] Nani Gasser, MD                           Medical Decision Making Amount and/or Complexity of Data Reviewed Labs: ordered. Radiology: ordered.   This patient is an 81 year old female with constipation ongoing for 10 days.  Patient is passing small amounts of liquid stool and frequent flatus.  No severe nausea with vomiting.  Exam is reassuring.  Afebrile and hemodynamically stable.  Abdomen diffusely tender to deep palpation.  No obvious masses or signs of peritoneal irritation.  Do not suspect complete obstruction  or perforation.  Rectal exam normal without stool in rectal vault. Will obtain CT abdomen pelvis to rule out mass effect from neoplasia.  Will get basic labs and UA.  Suspect functional constipation or IBS in this patient with history of chronic diarrhea. Will discharge with instructions to continue taking miralax. Can restart Senna which she was taking earlier in her illness course.    Co morbidities that complicate the patient evaluation  History of pelvic surgery  Additional history obtained:  Additional history and/or information obtained from chart review External records from outside source obtained and reviewed including charts from prior encounters with PCP and [facility]   Lab Tests:  I Ordered (or co-signed), and personally interpreted labs.  The pertinent results include:   CMP, CBC, UA, lipase within normal limits   Imaging Studies ordered:  I ordered (or co-signed) imaging studies including CT abdomen pelvis w contrast  I independently visualized and interpreted imaging which showed no obstruction, mass, to explain clinical syndrome. I agree with the radiologist interpretation  Reevaluation:  After the interventions noted above, I reevaluated the patient and found that they have :stayed the same.    Dispostion:  After consideration of the diagnostic results and the patients response to treatment, I feel that the patent would benefit from discharge home with instructions to continue taking MiraLAX.  Return precautions provided.  Encourage follow-up with PCP and GI.          Final Clinical Impression(s) / ED Diagnoses Final diagnoses:  Functional constipation  Functional diarrhea    Rx / DC Orders ED Discharge Orders     None         Nani Gasser, MD 02/03/22 1453    Gareth Morgan, MD 02/03/22 2300

## 2022-02-03 NOTE — ED Triage Notes (Signed)
Pt here with ongoing constipation and abdominal bloating. Seen several times for same. Recent xray show no evidence of SBO. Pt states small BM yesterday. Has tried fleet enemas, miralax and docusate without relief.

## 2022-02-03 NOTE — ED Notes (Signed)
Pt stated she just went to the bathroom while in imaging and will not be able to provide a urine sample at this time. Will try again later.

## 2022-02-06 DIAGNOSIS — M546 Pain in thoracic spine: Secondary | ICD-10-CM | POA: Diagnosis not present

## 2022-02-06 DIAGNOSIS — M9901 Segmental and somatic dysfunction of cervical region: Secondary | ICD-10-CM | POA: Diagnosis not present

## 2022-02-06 DIAGNOSIS — M542 Cervicalgia: Secondary | ICD-10-CM | POA: Diagnosis not present

## 2022-02-06 DIAGNOSIS — M5451 Vertebrogenic low back pain: Secondary | ICD-10-CM | POA: Diagnosis not present

## 2022-02-06 DIAGNOSIS — M9905 Segmental and somatic dysfunction of pelvic region: Secondary | ICD-10-CM | POA: Diagnosis not present

## 2022-02-06 DIAGNOSIS — M5126 Other intervertebral disc displacement, lumbar region: Secondary | ICD-10-CM | POA: Diagnosis not present

## 2022-02-06 DIAGNOSIS — M9903 Segmental and somatic dysfunction of lumbar region: Secondary | ICD-10-CM | POA: Diagnosis not present

## 2022-02-06 DIAGNOSIS — M9902 Segmental and somatic dysfunction of thoracic region: Secondary | ICD-10-CM | POA: Diagnosis not present

## 2022-02-07 ENCOUNTER — Other Ambulatory Visit: Payer: Self-pay | Admitting: Family Medicine

## 2022-02-08 ENCOUNTER — Telehealth: Payer: Self-pay | Admitting: Family Medicine

## 2022-02-08 DIAGNOSIS — M9905 Segmental and somatic dysfunction of pelvic region: Secondary | ICD-10-CM | POA: Diagnosis not present

## 2022-02-08 DIAGNOSIS — M5451 Vertebrogenic low back pain: Secondary | ICD-10-CM | POA: Diagnosis not present

## 2022-02-08 DIAGNOSIS — M5126 Other intervertebral disc displacement, lumbar region: Secondary | ICD-10-CM | POA: Diagnosis not present

## 2022-02-08 DIAGNOSIS — M546 Pain in thoracic spine: Secondary | ICD-10-CM | POA: Diagnosis not present

## 2022-02-08 DIAGNOSIS — M9902 Segmental and somatic dysfunction of thoracic region: Secondary | ICD-10-CM | POA: Diagnosis not present

## 2022-02-08 DIAGNOSIS — M9901 Segmental and somatic dysfunction of cervical region: Secondary | ICD-10-CM | POA: Diagnosis not present

## 2022-02-08 DIAGNOSIS — M542 Cervicalgia: Secondary | ICD-10-CM | POA: Diagnosis not present

## 2022-02-08 DIAGNOSIS — M9903 Segmental and somatic dysfunction of lumbar region: Secondary | ICD-10-CM | POA: Diagnosis not present

## 2022-02-08 NOTE — Telephone Encounter (Signed)
Faxed

## 2022-02-08 NOTE — Telephone Encounter (Signed)
Caller name: Judson Roch physical therapist @ [facility]? :yes/no: No  Call back number: [phone]  Provider they see: Birdie Riddle  Reason for call: Calling to get PT orders for neck pain (pt told them we were sending them, but they have not rec'd any orders yet). and also needs copies of x-rays (gave her the # for medical records to get x-rays). Fax # [phone]

## 2022-02-10 DIAGNOSIS — M9902 Segmental and somatic dysfunction of thoracic region: Secondary | ICD-10-CM | POA: Diagnosis not present

## 2022-02-10 DIAGNOSIS — M5126 Other intervertebral disc displacement, lumbar region: Secondary | ICD-10-CM | POA: Diagnosis not present

## 2022-02-10 DIAGNOSIS — M542 Cervicalgia: Secondary | ICD-10-CM | POA: Diagnosis not present

## 2022-02-10 DIAGNOSIS — M9903 Segmental and somatic dysfunction of lumbar region: Secondary | ICD-10-CM | POA: Diagnosis not present

## 2022-02-10 DIAGNOSIS — M5451 Vertebrogenic low back pain: Secondary | ICD-10-CM | POA: Diagnosis not present

## 2022-02-10 DIAGNOSIS — M546 Pain in thoracic spine: Secondary | ICD-10-CM | POA: Diagnosis not present

## 2022-02-10 DIAGNOSIS — M9901 Segmental and somatic dysfunction of cervical region: Secondary | ICD-10-CM | POA: Diagnosis not present

## 2022-02-10 DIAGNOSIS — M9905 Segmental and somatic dysfunction of pelvic region: Secondary | ICD-10-CM | POA: Diagnosis not present

## 2022-02-13 DIAGNOSIS — M6281 Muscle weakness (generalized): Secondary | ICD-10-CM | POA: Diagnosis not present

## 2022-02-13 DIAGNOSIS — M542 Cervicalgia: Secondary | ICD-10-CM | POA: Diagnosis not present

## 2022-02-15 DIAGNOSIS — M6281 Muscle weakness (generalized): Secondary | ICD-10-CM | POA: Diagnosis not present

## 2022-02-15 DIAGNOSIS — M542 Cervicalgia: Secondary | ICD-10-CM | POA: Diagnosis not present

## 2022-02-16 ENCOUNTER — Ambulatory Visit (INDEPENDENT_AMBULATORY_CARE_PROVIDER_SITE_OTHER): Payer: Medicare Other | Admitting: Family Medicine

## 2022-02-16 ENCOUNTER — Encounter: Payer: Self-pay | Admitting: Family Medicine

## 2022-02-16 VITALS — BP 130/62 | HR 76 | Temp 99.1°F | Resp 16 | Ht 63.0 in | Wt 148.2 lb

## 2022-02-16 DIAGNOSIS — J301 Allergic rhinitis due to pollen: Secondary | ICD-10-CM | POA: Diagnosis not present

## 2022-02-16 DIAGNOSIS — K529 Noninfective gastroenteritis and colitis, unspecified: Secondary | ICD-10-CM

## 2022-02-16 DIAGNOSIS — Z23 Encounter for immunization: Secondary | ICD-10-CM | POA: Diagnosis not present

## 2022-02-16 NOTE — Progress Notes (Signed)
   Subjective:    Patient ID: [name], female    DOB: [birth year], 81 y.o.   MRN: [id number]  HPI Sore throat- pt reports she is frequently getting sick, 'i pick up everything'.  Wants to know what she can do to boost her immunity.  Taking Cetirizine daily along w/ Singulair.  Diarrhea- pt reports she has alternated from severe constipation back to diarrhea.  Wants to know if there is anything she can take OTC to 'stabilize' bowels.     Review of Systems For ROS see HPI     Objective:   Physical Exam Constitutional:      General: She is not in acute distress.    Appearance: Normal appearance. She is well-developed. She is not ill-appearing.  HENT:     Head: Normocephalic and atraumatic.     Right Ear: Tympanic membrane normal.     Left Ear: Tympanic membrane normal.     Nose: Mucosal edema and congestion present. No rhinorrhea.     Right Sinus: No maxillary sinus tenderness or frontal sinus tenderness.     Left Sinus: No maxillary sinus tenderness or frontal sinus tenderness.     Mouth/Throat:     Pharynx: Posterior oropharyngeal erythema (w/ PND) present.  Eyes:     Conjunctiva/sclera: Conjunctivae normal.     Pupils: Pupils are equal, round, and reactive to light.  Cardiovascular:     Rate and Rhythm: Normal rate and regular rhythm.     Heart sounds: Normal heart sounds.  Pulmonary:     Effort: Pulmonary effort is normal. No respiratory distress.     Breath sounds: Normal breath sounds. No wheezing or rales.  Abdominal:     General: There is no distension.     Palpations: Abdomen is soft.     Tenderness: There is no abdominal tenderness. There is no guarding.  Musculoskeletal:     Cervical back: Normal range of motion and neck supple.  Lymphadenopathy:     Cervical: No cervical adenopathy.  Skin:    General: Skin is warm and dry.  Neurological:     General: No focal deficit present.     Mental Status: She is alert and oriented to person, place, and time.   Psychiatric:        Mood and Affect: Mood normal.        Behavior: Behavior normal.        Thought Content: Thought content normal.           Assessment & Plan:

## 2022-02-16 NOTE — Assessment & Plan Note (Signed)
Ongoing issue for pt.  Has seen GI multiple times.  Will start daily fiber supplement in hopes of better regulating her bowels.  Pt expressed understanding and is in agreement w/ plan.

## 2022-02-16 NOTE — Patient Instructions (Signed)
Follow up as needed or as scheduled START daily Zinc, Vit C, and Elderberry to improve your immune system ADD daily Fiber supplement (capsules or powder) to help stabilize your bowels Continue to drink lots of water!! The sore throat seems to be drainage and not infection- this is good news! Call with any questions or concerns Stay Safe!  Stay Healthy! Happy Labor Day!!!

## 2022-02-16 NOTE — Assessment & Plan Note (Signed)
Ongoing issue for pt.  Suspect this is the cause of her current sore throat rather than acute illness.  Encouraged her to continue daily Cetirizine and Singulair.  Discussed addition of saline nasal rinse upon coming in from outside.  Pt expressed understanding and is in agreement w/ plan.

## 2022-02-17 DIAGNOSIS — M6281 Muscle weakness (generalized): Secondary | ICD-10-CM | POA: Diagnosis not present

## 2022-02-17 DIAGNOSIS — M542 Cervicalgia: Secondary | ICD-10-CM | POA: Diagnosis not present

## 2022-02-22 DIAGNOSIS — M542 Cervicalgia: Secondary | ICD-10-CM | POA: Diagnosis not present

## 2022-02-22 DIAGNOSIS — M6281 Muscle weakness (generalized): Secondary | ICD-10-CM | POA: Diagnosis not present

## 2022-02-24 DIAGNOSIS — M542 Cervicalgia: Secondary | ICD-10-CM | POA: Diagnosis not present

## 2022-02-24 DIAGNOSIS — M6281 Muscle weakness (generalized): Secondary | ICD-10-CM | POA: Diagnosis not present

## 2022-02-24 NOTE — Telephone Encounter (Signed)
error 

## 2022-02-27 DIAGNOSIS — M542 Cervicalgia: Secondary | ICD-10-CM | POA: Diagnosis not present

## 2022-02-27 DIAGNOSIS — M6281 Muscle weakness (generalized): Secondary | ICD-10-CM | POA: Diagnosis not present

## 2022-03-01 DIAGNOSIS — M6281 Muscle weakness (generalized): Secondary | ICD-10-CM | POA: Diagnosis not present

## 2022-03-01 DIAGNOSIS — M542 Cervicalgia: Secondary | ICD-10-CM | POA: Diagnosis not present

## 2022-03-03 ENCOUNTER — Telehealth: Payer: Self-pay | Admitting: Family Medicine

## 2022-03-03 DIAGNOSIS — R200 Anesthesia of skin: Secondary | ICD-10-CM

## 2022-03-03 NOTE — Telephone Encounter (Signed)
Caller name: [place] Therapist at [facility] [facility]? :yes/no: Yes  Call back number:[phone]  Provider they see: Birdie Riddle   Reason for call: [place] states that pt needs occupational therapy. [place] want to know if Dr.Tabori can send [employer] a referral for pt. Fax number is [phone].

## 2022-03-03 NOTE — Telephone Encounter (Signed)
Ok to place OT referral but I'm not sure what the diagnosis is.  Why are they requesting OT?

## 2022-03-03 NOTE — Telephone Encounter (Signed)
Called to obtain more details for reason for OT no answer, LM requesting call back for details

## 2022-03-03 NOTE — Telephone Encounter (Signed)
Requesting referral to OT last visit 01/2022

## 2022-03-07 NOTE — Telephone Encounter (Signed)
Called back, they noted that when being assessed with PT the pt noted some hand and wrist numbness as well as general discomfort, this raised questions of possible nerve impingement or carpel tunnel and would like OT for assessment of these concerns, they did request hard copy be signed and faxed to them.

## 2022-03-07 NOTE — Telephone Encounter (Signed)
[place] from [facility] is returning a phone call from [place]. [place] number is [phone] ext [phone]

## 2022-03-08 NOTE — Telephone Encounter (Signed)
Printed referral, had Dr Birdie Riddle sign and I have now faxed back to [phone]

## 2022-03-08 NOTE — Addendum Note (Signed)
Addended by: Midge Minium on: 03/08/2022 07:27 AM   Modules accepted: Orders

## 2022-03-08 NOTE — Telephone Encounter (Signed)
Actually it's saying I can't print the order as no printer is attached.  I this could be printed and brought to me to sign, I would appreciate it.

## 2022-03-08 NOTE — Telephone Encounter (Signed)
Referral placed.  Will sign and place on desk to be faxed

## 2022-03-13 DIAGNOSIS — M6281 Muscle weakness (generalized): Secondary | ICD-10-CM | POA: Diagnosis not present

## 2022-03-13 DIAGNOSIS — M542 Cervicalgia: Secondary | ICD-10-CM | POA: Diagnosis not present

## 2022-03-14 ENCOUNTER — Telehealth: Payer: Self-pay | Admitting: Family Medicine

## 2022-03-14 ENCOUNTER — Other Ambulatory Visit: Payer: Self-pay | Admitting: Physician Assistant

## 2022-03-14 DIAGNOSIS — R200 Anesthesia of skin: Secondary | ICD-10-CM

## 2022-03-14 NOTE — Telephone Encounter (Signed)
Okay to order referral to ortho for assessment of carpel tunnel ?

## 2022-03-14 NOTE — Telephone Encounter (Signed)
Caller name: Glorine Hanratty   On DPR? :yes/no: Yes  Call back number: [phone]  Provider they see:  Birdie Riddle   Reason for call:  Pt called stating that she had Phalen and Tinel test done yesterday. Pt states that her therapist requested her to go see Orthopedic doctor. Pt stating that the therapist told her , she might have carpal tunnel. Pt needs a referral to see a Orthopedic doctor.

## 2022-03-14 NOTE — Telephone Encounter (Signed)
Ok to refer to ortho

## 2022-03-14 NOTE — Telephone Encounter (Signed)
Placed referral to Ortho.   Called pt and informed her and informed her, she will be out of town next week ortho will need to call her cell [phone]

## 2022-03-17 ENCOUNTER — Ambulatory Visit (INDEPENDENT_AMBULATORY_CARE_PROVIDER_SITE_OTHER): Payer: Medicare Other | Admitting: Orthopaedic Surgery

## 2022-03-17 ENCOUNTER — Encounter: Payer: Self-pay | Admitting: Orthopaedic Surgery

## 2022-03-17 DIAGNOSIS — G5603 Carpal tunnel syndrome, bilateral upper limbs: Secondary | ICD-10-CM | POA: Diagnosis not present

## 2022-03-17 NOTE — Progress Notes (Signed)
Office Visit Note   Patient: [name]           Date of Birth: March 10, 1941           MRN: [id number] Visit Date: 03/17/2022              Requested by: Midge Minium, MD [address] [place],  [place] [address] PCP: Midge Minium, MD   Assessment & Plan: Visit Diagnoses:  1. Bilateral carpal tunnel syndrome     Plan: Impression is bilateral hand carpal tunnel syndrome left greater than right.  At this point, we discussed referral to Dr. Ernestina Patches for nerve conduction study bilateral upper extremity.  In the meantime, I have provided her with a removable night splints.  She will follow-up with [place] following the nerve conduction study.  Call with concerns or questions.  Follow-Up Instructions: Return for f/u after ncs/emg.   Orders:  No orders of the defined types were placed in this encounter.  No orders of the defined types were placed in this encounter.     Procedures: No procedures performed   Clinical Data: No additional findings.   Subjective: Chief Complaint  Patient presents with   Right Hand - Pain   Left Hand - Pain    HPI patient is a pleasant 81 year old right-hand-dominant female who comes in today with bilateral hand pain and paresthesias left greater than right.  Symptoms have been ongoing for a while.  She notes that she does a lot of cooking where she is also cutting quite a bit.  Symptoms are worse at night as well as with driving and cooking where she frequently has to shake her hands due to the paresthesias.  She has not tried night splints.  The symptoms she gets her to the entire aspect of both hands.  No previous nerve conduction study or carpal tunnel injections.  Review of Systems as detailed in HPI.  All others reviewed and are negative.   Objective: Vital Signs: There were no vitals taken for this visit.  Physical Exam well-developed well-nourished female no acute distress.  Alert and oriented x3.  Ortho Exam bilateral hand  exam reveals positive Phalen and positive Tinel.  No thenar atrophy.  Slight decrease sensation throughout the median nerve distribution.  Specialty Comments:  No specialty comments available.  Imaging: No new imaging   PMFS History: Patient Active Problem List   Diagnosis Date Noted   Pain of left hip joint 06/03/2021   Gastro-esophageal reflux disease without esophagitis 10/15/2020   Disorders of muscle in diseases classified elsewhere, multiple sites 10/15/2020   Other muscle spasm 10/15/2020   Post-traumatic osteoarthritis 08/10/2020   Overweight (BMI 25.0-29.9) 06/01/2020   Pain in left knee 05/27/2018   Thyroid nodule 03/13/2018   Chronic diarrhea 12/26/2017   S/P shoulder replacement, right 09/15/2016   Benign paroxysmal positional vertigo 10/14/2014   Back pain 08/27/2014   Decreased hearing of both ears 05/19/2014   S/P cholecystectomy 10/02/2013   Asthma, moderate persistent 05/16/2013   H/O recurrent pneumonia 05/16/2013   Insomnia 05/05/2013   Gluten intolerance 05/06/2012   Renal calculus, left 01/29/2012   Painful bladder spasm 12/14/2011   Lactose intolerance 12/14/2011   Post-menopausal 12/14/2011   Microscopic colitis 11/08/2011   Hepatic steatosis 08/25/2011   Pulmonary nodule, right 08/25/2011   Hematuria 08/23/2011   Memory loss 12/12/2010   Diabetes mellitus type II, controlled, with no complications ([place]) 62/56/3893   Hyperlipidemia 07/14/2010   Depression  with anxiety 07/14/2010   GLAUCOMA 07/14/2010   Allergic rhinitis 07/14/2010   Past Medical History:  Diagnosis Date   Allergic rhinitis    Asthma    Diabetes mellitus    just changed from injection to metformin ? month   Dysrhythmia    palpitations   GERD (gastroesophageal reflux disease)    Glaucoma    History of kidney stones    Hyperlipemia    Migraines    Palpitations    Pneumonia    hx   Urinary tract infection     Family History  Problem Relation Age of Onset   Diabetes  Father    Colon cancer Neg Hx    Esophageal cancer Neg Hx    Rectal cancer Neg Hx    Stomach cancer Neg Hx    Breast cancer Neg Hx     Past Surgical History:  Procedure Laterality Date   ABDOMINAL HYSTERECTOMY     Has ovaries   BACK SURGERY  1987   lumb   BREAST SURGERY Left 07/2016   bx- needle in breast   CHOLECYSTECTOMY  09/26/13   CLOSED REDUCTION NASAL FRACTURE  04/29/2012   Procedure: CLOSED REDUCTION NASAL FRACTURE;  Surgeon: Jodi Marble, MD;  Location: [facility];  Service: ENT;  Laterality: N/A;  WITH STABILIZATION   COLONOSCOPY     Deviated septum repair     EYE SURGERY Bilateral    cataracts   FOOT SURGERY Bilateral    ingrown toe nails   LITHOTRIPSY     REVERSE SHOULDER ARTHROPLASTY Right 09/15/2016   Procedure: RIGHT REVERSE SHOULDER ARTHROPLASTY;  Surgeon: Netta Cedars, MD;  Location: [facility];  Service: Orthopedics;  Laterality: Right;   REVERSE SHOULDER ARTHROPLASTY Left 10/15/2020   Procedure: REVERSE SHOULDER ARTHROPLASTY;  Surgeon: Netta Cedars, MD;  Location: [facility];  Service: Orthopedics;  Laterality: Left;   ROTATOR CUFF REPAIR     right   TONSILLECTOMY     Social History   Occupational History   Occupation: Retired -Sports coach: RETIRED  Tobacco Use   Smoking status: Former    Packs/day: 0.10    Years: 4.00    Total pack years: 0.40    Types: Cigarettes    Quit date: 06/19/1986    Years since quitting: 35.7   Smokeless tobacco: Never   Tobacco comments:    smoked in college years ago  Vaping Use   Vaping Use: Never used  Substance and Sexual Activity   Alcohol use: Yes    Comment: 1-2 glasses week variety beer occ scotch   Drug use: No   Sexual activity: Not Currently    Birth control/protection: Surgical

## 2022-03-20 ENCOUNTER — Telehealth: Payer: Self-pay

## 2022-03-20 NOTE — Telephone Encounter (Signed)
Left message for patient to retun call to schedule follow up appointment.  Will continue efforts.

## 2022-03-20 NOTE — Telephone Encounter (Signed)
-----   Message from Stevan Born, [place] sent at 11/16/2021  9:21 AM EDT ----- Regarding: November 2023 6 mo flup, DJ, dx chronic diarrhea

## 2022-03-21 NOTE — Telephone Encounter (Signed)
Left message for patient to retun call to schedule follow up appointment.  Will continue efforts.

## 2022-03-23 NOTE — Telephone Encounter (Signed)
Left message for patient to retun call to schedule follow up appointment.  Will continue efforts.

## 2022-03-24 NOTE — Telephone Encounter (Signed)
Patient is scheduled for 05-18-22 at 11:00am with Amy Selz.

## 2022-03-28 ENCOUNTER — Ambulatory Visit (INDEPENDENT_AMBULATORY_CARE_PROVIDER_SITE_OTHER): Payer: Medicare Other | Admitting: Physical Medicine and Rehabilitation

## 2022-03-28 DIAGNOSIS — R202 Paresthesia of skin: Secondary | ICD-10-CM

## 2022-03-28 DIAGNOSIS — J42 Unspecified chronic bronchitis: Secondary | ICD-10-CM | POA: Diagnosis not present

## 2022-03-28 DIAGNOSIS — J4540 Moderate persistent asthma, uncomplicated: Secondary | ICD-10-CM | POA: Diagnosis not present

## 2022-03-29 ENCOUNTER — Encounter: Payer: Medicare Other | Admitting: Physical Medicine and Rehabilitation

## 2022-03-31 ENCOUNTER — Telehealth: Payer: Self-pay | Admitting: Family Medicine

## 2022-03-31 NOTE — Telephone Encounter (Signed)
Received OT Eval forms from [employer]. Placed in Dr. Virgil Benedict bin.

## 2022-03-31 NOTE — Telephone Encounter (Signed)
Forms placed in Dr Birdie Riddle to be signed folder and once signed I will fax back

## 2022-04-03 NOTE — Telephone Encounter (Signed)
Form signed and returned to [facility] 

## 2022-04-03 NOTE — Telephone Encounter (Signed)
Forms faxed to [facility] at [facility]

## 2022-04-04 ENCOUNTER — Ambulatory Visit (INDEPENDENT_AMBULATORY_CARE_PROVIDER_SITE_OTHER): Payer: Medicare Other | Admitting: Orthopaedic Surgery

## 2022-04-04 ENCOUNTER — Encounter (HOSPITAL_BASED_OUTPATIENT_CLINIC_OR_DEPARTMENT_OTHER): Payer: Self-pay | Admitting: Orthopaedic Surgery

## 2022-04-04 ENCOUNTER — Encounter: Payer: Self-pay | Admitting: Orthopaedic Surgery

## 2022-04-04 ENCOUNTER — Other Ambulatory Visit: Payer: Self-pay

## 2022-04-04 DIAGNOSIS — G5602 Carpal tunnel syndrome, left upper limb: Secondary | ICD-10-CM | POA: Insufficient documentation

## 2022-04-04 DIAGNOSIS — G5601 Carpal tunnel syndrome, right upper limb: Secondary | ICD-10-CM | POA: Diagnosis not present

## 2022-04-04 NOTE — Progress Notes (Signed)
Office Visit Note   Patient: [name]           Date of Birth: [birth year]           MRN: [id number] Visit Date: 04/04/2022              Requested by: Midge Minium, MD [address] [place],  [place] [address] PCP: Midge Minium, MD   Assessment & Plan: Visit Diagnoses:  1. Right carpal tunnel syndrome   2. Left carpal tunnel syndrome     Plan: Nerve conduction studies show severe right carpal tunnel syndrome and moderate to severe left carpal tunnel syndrome.  Based on these findings I have recommended surgical release starting with the right one.  Details of the surgery including risk benefits prognosis reviewed.  All questions answered to her satisfaction.  We will get her scheduled for surgery ASAP per her request.  Follow-Up Instructions: No follow-ups on file.   Orders:  No orders of the defined types were placed in this encounter.  No orders of the defined types were placed in this encounter.     Procedures: No procedures performed   Clinical Data: No additional findings.   Subjective: Chief Complaint  Patient presents with   Left Elbow - Follow-up    EMG review   Right Wrist - Follow-up    EMG review    HPI [name] returns today to discuss recent nerve conduction studies.  She is currently wearing braces at night.  Review of Systems   Objective: Vital Signs: There were no vitals taken for this visit.  Physical Exam  Ortho Exam Examination of both hands show no obvious muscle atrophy.  Thenar muscular strength is intact.  Normal capillary refill.  Normal skin temperature. Specialty Comments:  No specialty comments available.  Imaging: No results found.   PMFS History: Patient Active Problem List   Diagnosis Date Noted   Right carpal tunnel syndrome 04/04/2022   Left carpal tunnel syndrome 04/04/2022   Pain of left hip joint 06/03/2021   Gastro-esophageal reflux disease without esophagitis 10/15/2020   Disorders of  muscle in diseases classified elsewhere, multiple sites 10/15/2020   Other muscle spasm 10/15/2020   Post-traumatic osteoarthritis 08/10/2020   Overweight (BMI 25.0-29.9) 06/01/2020   Pain in left knee 05/27/2018   Thyroid nodule 03/13/2018   Chronic diarrhea 12/26/2017   S/P shoulder replacement, right 09/15/2016   Benign paroxysmal positional vertigo 10/14/2014   Back pain 08/27/2014   Decreased hearing of both ears 05/19/2014   S/P cholecystectomy 10/02/2013   Asthma, moderate persistent 05/16/2013   H/O recurrent pneumonia 05/16/2013   Insomnia 05/05/2013   Gluten intolerance 05/06/2012   Renal calculus, left 01/29/2012   Painful bladder spasm 12/14/2011   Lactose intolerance 12/14/2011   Post-menopausal 12/14/2011   Microscopic colitis 11/08/2011   Hepatic steatosis 08/25/2011   Pulmonary nodule, right 08/25/2011   Hematuria 08/23/2011   Memory loss 12/12/2010   Diabetes mellitus type II, controlled, with no complications ([facility]) 32/67/1245   Hyperlipidemia 07/14/2010   Depression with anxiety 07/14/2010   GLAUCOMA 07/14/2010   Allergic rhinitis 07/14/2010   Past Medical History:  Diagnosis Date   Allergic rhinitis    Asthma    Diabetes mellitus    just changed from injection to metformin ? month   Dysrhythmia    palpitations   GERD (gastroesophageal reflux disease)    Glaucoma    History of kidney stones    Hyperlipemia  Migraines    Palpitations    Pneumonia    hx   Urinary tract infection     Family History  Problem Relation Age of Onset   Diabetes Father    Colon cancer Neg Hx    Esophageal cancer Neg Hx    Rectal cancer Neg Hx    Stomach cancer Neg Hx    Breast cancer Neg Hx     Past Surgical History:  Procedure Laterality Date   ABDOMINAL HYSTERECTOMY     Has ovaries   BACK SURGERY  1987   lumb   BREAST SURGERY Left 07/2016   bx- needle in breast   CHOLECYSTECTOMY  09/26/13   CLOSED REDUCTION NASAL FRACTURE  04/29/2012   Procedure: CLOSED  REDUCTION NASAL FRACTURE;  Surgeon: Jodi Marble, MD;  Location: [facility];  Service: ENT;  Laterality: N/A;  WITH STABILIZATION   COLONOSCOPY     Deviated septum repair     EYE SURGERY Bilateral    cataracts   FOOT SURGERY Bilateral    ingrown toe nails   LITHOTRIPSY     REVERSE SHOULDER ARTHROPLASTY Right 09/15/2016   Procedure: RIGHT REVERSE SHOULDER ARTHROPLASTY;  Surgeon: Netta Cedars, MD;  Location: [facility];  Service: Orthopedics;  Laterality: Right;   REVERSE SHOULDER ARTHROPLASTY Left 10/15/2020   Procedure: REVERSE SHOULDER ARTHROPLASTY;  Surgeon: Netta Cedars, MD;  Location: [facility];  Service: Orthopedics;  Laterality: Left;   ROTATOR CUFF REPAIR     right   TONSILLECTOMY     Social History   Occupational History   Occupation: Retired -Sports coach: RETIRED  Tobacco Use   Smoking status: Former    Packs/day: 0.10    Years: 4.00    Total pack years: 0.40    Types: Cigarettes    Quit date: 06/19/1986    Years since quitting: 35.8   Smokeless tobacco: Never   Tobacco comments:    smoked in college years ago  Vaping Use   Vaping Use: Never used  Substance and Sexual Activity   Alcohol use: Yes    Comment: 1-2 glasses week variety beer occ scotch   Drug use: No   Sexual activity: Not Currently    Birth control/protection: Surgical

## 2022-04-04 NOTE — Procedures (Signed)
EMG & NCV Findings: Evaluation of the left median motor nerve showed prolonged distal onset latency (5.0 ms) and decreased conduction velocity (Elbow-Wrist, 49 m/s).  The right median motor nerve showed prolonged distal onset latency (6.8 ms), reduced amplitude (2.8 mV), and decreased conduction velocity (Elbow-Wrist, 48 m/s).  The left median (across palm) sensory nerve showed prolonged distal peak latency (Wrist, 5.3 ms) and prolonged distal peak latency (Palm, 2.3 ms).  The right median (across palm) sensory nerve showed prolonged distal peak latency (Wrist, 6.4 ms), reduced amplitude (6.2 V), and prolonged distal peak latency (Palm, 3.6 ms).  All remaining nerves (as indicated in the following tables) were within normal limits.  Left vs. Right side comparison data for the median motor nerve indicates abnormal L-R latency difference (1.8 ms) and abnormal L-R amplitude difference (55.6 %).  The ulnar sensory nerve indicates abnormal L-R latency difference (0.5 ms).  All remaining left vs. right side differences were within normal limits.    Needle evaluation of the right abductor pollicis brevis muscle showed increased insertional activity and diminished recruitment.  All remaining muscles (as indicated in the following table) showed no evidence of electrical instability.    Impression: The above electrodiagnostic study is ABNORMAL and reveals evidence of:   a severe right median nerve entrapment at the wrist (carpal tunnel syndrome) affecting sensory and motor components.   a moderate to severe left median nerve entrapment at the wrist (carpal tunnel syndrome) affecting sensory and motor components.   There is no significant electrodiagnostic evidence of any other focal nerve entrapment, brachial plexopathy or cervical radiculopathy.   Recommendations: 1.  Follow-up with referring physician. 2.  Continue current management of symptoms. 3.  Suggest surgical  evaluation.  ___________________________ Laurence Spates FAAPMR Board Certified, American Board of Physical Medicine and Rehabilitation    Nerve Conduction Studies Anti Sensory Summary Table   Stim Site NR Peak (ms) Norm Peak (ms) P-T Amp (V) Norm P-T Amp Site1 Site2 Delta-P (ms) Dist (cm) Vel (m/s) Norm Vel (m/s)  Left Median Acr Palm Anti Sensory (2nd Digit)  29.7C  Wrist    *5.3 <3.6 27.9 >10 Wrist Palm 3.0 0.0    Palm    *2.3 <2.0 23.1         Right Median Acr Palm Anti Sensory (2nd Digit)  31.6C  Wrist    *6.4 <3.6 *6.2 >10 Wrist Palm 2.8 0.0    Palm    *3.6 <2.0 3.8         Left Radial Anti Sensory (Base 1st Digit)  29.9C  Wrist    2.4 <3.1 27.5  Wrist Base 1st Digit 2.4 0.0    Right Radial Anti Sensory (Base 1st Digit)  31.3C  Wrist    2.2 <3.1 16.5  Wrist Base 1st Digit 2.2 0.0    Left Ulnar Anti Sensory (5th Digit)  30.3C  Wrist    3.7 <3.7 25.5 >15.0 Wrist 5th Digit 3.7 14.0 38 >38  Right Ulnar Anti Sensory (5th Digit)  31.8C  Wrist    3.2 <3.7 18.2 >15.0 Wrist 5th Digit 3.2 14.0 44 >38   Motor Summary Table   Stim Site NR Onset (ms) Norm Onset (ms) O-P Amp (mV) Norm O-P Amp Site1 Site2 Delta-0 (ms) Dist (cm) Vel (m/s) Norm Vel (m/s)  Left Median Motor (Abd Poll Brev)  30.3C  Wrist    *5.0 <4.2 6.3 >5 Elbow Wrist 4.1 20.0 *49 >50  Elbow    9.1  6.0  Right Median Motor (Abd Poll Brev)  31.5C  Wrist    *6.8 <4.2 *2.8 >5 Elbow Wrist 4.2 20.0 *48 >50  Elbow    11.0  2.5         Left Ulnar Motor (Abd Dig Min)  30.4C  Wrist    3.0 <4.2 8.3 >3 B Elbow Wrist 3.0 19.0 63 >53  B Elbow    6.0  8.5  A Elbow B Elbow 1.9 10.0 53 >53  A Elbow    7.9  8.0         Right Ulnar Motor (Abd Dig Min)  31.5C  Wrist    2.8 <4.2 9.4 >3 B Elbow Wrist 3.1 18.5 60 >53  B Elbow    5.9  10.2  A Elbow B Elbow 1.8 10.0 56 >53  A Elbow    7.7  8.6          EMG   Side Muscle Nerve Root Ins Act Fibs Psw Amp Dur Poly Recrt Int Fraser Din Comment  Right Abd Poll Brev Median C8-T1 *Incr  Nml Nml Nml Nml 0 *Reduced Nml   Right 1stDorInt Ulnar C8-T1 Nml Nml Nml Nml Nml 0 Nml Nml     Nerve Conduction Studies Anti Sensory Left/Right Comparison   Stim Site L Lat (ms) R Lat (ms) L-R Lat (ms) L Amp (V) R Amp (V) L-R Amp (%) Site1 Site2 L Vel (m/s) R Vel (m/s) L-R Vel (m/s)  Median Acr Palm Anti Sensory (2nd Digit)  29.7C  Wrist *5.3 *6.4 1.1 27.9 *6.2 77.8 Wrist Palm     Palm *2.3 *3.6 1.3 23.1 3.8 83.5       Radial Anti Sensory (Base 1st Digit)  29.9C  Wrist 2.4 2.2 0.2 27.5 16.5 40.0 Wrist Base 1st Digit     Ulnar Anti Sensory (5th Digit)  30.3C  Wrist 3.7 3.2 *0.5 25.5 18.2 28.6 Wrist 5th Digit 38 44 6   Motor Left/Right Comparison   Stim Site L Lat (ms) R Lat (ms) L-R Lat (ms) L Amp (mV) R Amp (mV) L-R Amp (%) Site1 Site2 L Vel (m/s) R Vel (m/s) L-R Vel (m/s)  Median Motor (Abd Poll Brev)  30.3C  Wrist *5.0 *6.8 *1.8 6.3 *2.8 *55.6 Elbow Wrist *49 *48 1  Elbow 9.1 11.0 1.9 6.0 2.5 58.3       Ulnar Motor (Abd Dig Min)  30.4C  Wrist 3.0 2.8 0.2 8.3 9.4 11.7 B Elbow Wrist 63 60 3  B Elbow 6.0 5.9 0.1 8.5 10.2 16.7 A Elbow B Elbow 53 56 3  A Elbow 7.9 7.7 0.2 8.0 8.6 7.0          Waveforms:

## 2022-04-04 NOTE — Progress Notes (Signed)
[name] - 81 y.o. female MRN [id number]  Date of birth: July 17, 1940  Office Visit Note: Visit Date: 03/28/2022 PCP: Midge Minium, MD Referred by: Leandrew Koyanagi, MD  Subjective: Chief Complaint  Patient presents with   Right Hand - Pain, Numbness   Left Hand - Numbness, Pain   HPI:  [name] is a 81 y.o. female who comes in today at the request of Dr. Eduard Roux for electrodiagnostic study of the Bilateral upper extremities.  Patient is Right hand dominant.  She reports bilateral hand pain and tingling left greater than right.  Symptoms have been ongoing chronically.  She notes that she does a lot of cooking where she is also cutting quite a bit.  Symptoms are worse at night as well as with driving and cooking where she frequently has to shake her hands due to the paresthesias.  The symptoms distribute globally in both hands.  No previous nerve conduction study or carpal tunnel injections.   ROS Otherwise per HPI.  Assessment & Plan: Visit Diagnoses:    ICD-10-CM   1. Paresthesia of skin  R20.2 NCV with EMG (electromyography)      Plan: Impression: The above electrodiagnostic study is ABNORMAL and reveals evidence of:   a severe right median nerve entrapment at the wrist (carpal tunnel syndrome) affecting sensory and motor components.   a moderate to severe left median nerve entrapment at the wrist (carpal tunnel syndrome) affecting sensory and motor components.   There is no significant electrodiagnostic evidence of any other focal nerve entrapment, brachial plexopathy or cervical radiculopathy.   Recommendations: 1.  Follow-up with referring physician. 2.  Continue current management of symptoms. 3.  Suggest surgical evaluation.  Meds & Orders: No orders of the defined types were placed in this encounter.   Orders Placed This Encounter  Procedures   NCV with EMG (electromyography)    Follow-up: Return in about 2 weeks (around 04/11/2022) for  Eduard Roux, MD.   Procedures: No procedures performed  EMG & NCV Findings: Evaluation of the left median motor nerve showed prolonged distal onset latency (5.0 ms) and decreased conduction velocity (Elbow-Wrist, 49 m/s).  The right median motor nerve showed prolonged distal onset latency (6.8 ms), reduced amplitude (2.8 mV), and decreased conduction velocity (Elbow-Wrist, 48 m/s).  The left median (across palm) sensory nerve showed prolonged distal peak latency (Wrist, 5.3 ms) and prolonged distal peak latency (Palm, 2.3 ms).  The right median (across palm) sensory nerve showed prolonged distal peak latency (Wrist, 6.4 ms), reduced amplitude (6.2 V), and prolonged distal peak latency (Palm, 3.6 ms).  All remaining nerves (as indicated in the following tables) were within normal limits.  Left vs. Right side comparison data for the median motor nerve indicates abnormal L-R latency difference (1.8 ms) and abnormal L-R amplitude difference (55.6 %).  The ulnar sensory nerve indicates abnormal L-R latency difference (0.5 ms).  All remaining left vs. right side differences were within normal limits.    Needle evaluation of the right abductor pollicis brevis muscle showed increased insertional activity and diminished recruitment.  All remaining muscles (as indicated in the following table) showed no evidence of electrical instability.    Impression: The above electrodiagnostic study is ABNORMAL and reveals evidence of:   a severe right median nerve entrapment at the wrist (carpal tunnel syndrome) affecting sensory and motor components.   a moderate to severe left median nerve entrapment at the wrist (carpal tunnel syndrome) affecting sensory  and motor components.   There is no significant electrodiagnostic evidence of any other focal nerve entrapment, brachial plexopathy or cervical radiculopathy.   Recommendations: 1.  Follow-up with referring physician. 2.  Continue current management of symptoms. 3.   Suggest surgical evaluation.  ___________________________ Laurence Spates FAAPMR Board Certified, American Board of Physical Medicine and Rehabilitation    Nerve Conduction Studies Anti Sensory Summary Table   Stim Site NR Peak (ms) Norm Peak (ms) P-T Amp (V) Norm P-T Amp Site1 Site2 Delta-P (ms) Dist (cm) Vel (m/s) Norm Vel (m/s)  Left Median Acr Palm Anti Sensory (2nd Digit)  29.7C  Wrist    *5.3 <3.6 27.9 >10 Wrist Palm 3.0 0.0    Palm    *2.3 <2.0 23.1         Right Median Acr Palm Anti Sensory (2nd Digit)  31.6C  Wrist    *6.4 <3.6 *6.2 >10 Wrist Palm 2.8 0.0    Palm    *3.6 <2.0 3.8         Left Radial Anti Sensory (Base 1st Digit)  29.9C  Wrist    2.4 <3.1 27.5  Wrist Base 1st Digit 2.4 0.0    Right Radial Anti Sensory (Base 1st Digit)  31.3C  Wrist    2.2 <3.1 16.5  Wrist Base 1st Digit 2.2 0.0    Left Ulnar Anti Sensory (5th Digit)  30.3C  Wrist    3.7 <3.7 25.5 >15.0 Wrist 5th Digit 3.7 14.0 38 >38  Right Ulnar Anti Sensory (5th Digit)  31.8C  Wrist    3.2 <3.7 18.2 >15.0 Wrist 5th Digit 3.2 14.0 44 >38   Motor Summary Table   Stim Site NR Onset (ms) Norm Onset (ms) O-P Amp (mV) Norm O-P Amp Site1 Site2 Delta-0 (ms) Dist (cm) Vel (m/s) Norm Vel (m/s)  Left Median Motor (Abd Poll Brev)  30.3C  Wrist    *5.0 <4.2 6.3 >5 Elbow Wrist 4.1 20.0 *49 >50  Elbow    9.1  6.0         Right Median Motor (Abd Poll Brev)  31.5C  Wrist    *6.8 <4.2 *2.8 >5 Elbow Wrist 4.2 20.0 *48 >50  Elbow    11.0  2.5         Left Ulnar Motor (Abd Dig Min)  30.4C  Wrist    3.0 <4.2 8.3 >3 B Elbow Wrist 3.0 19.0 63 >53  B Elbow    6.0  8.5  A Elbow B Elbow 1.9 10.0 53 >53  A Elbow    7.9  8.0         Right Ulnar Motor (Abd Dig Min)  31.5C  Wrist    2.8 <4.2 9.4 >3 B Elbow Wrist 3.1 18.5 60 >53  B Elbow    5.9  10.2  A Elbow B Elbow 1.8 10.0 56 >53  A Elbow    7.7  8.6          EMG   Side Muscle Nerve Root Ins Act Fibs Psw Amp Dur Poly Recrt Int Fraser Din Comment  Right Abd Poll Brev  Median C8-T1 *Incr Nml Nml Nml Nml 0 *Reduced Nml   Right 1stDorInt Ulnar C8-T1 Nml Nml Nml Nml Nml 0 Nml Nml     Nerve Conduction Studies Anti Sensory Left/Right Comparison   Stim Site L Lat (ms) R Lat (ms) L-R Lat (ms) L Amp (V) R Amp (V) L-R Amp (%) Site1 Site2 L Vel (m/s) R Vel (m/s)  L-R Vel (m/s)  Median Acr Palm Anti Sensory (2nd Digit)  29.7C  Wrist *5.3 *6.4 1.1 27.9 *6.2 77.8 Wrist Palm     Palm *2.3 *3.6 1.3 23.1 3.8 83.5       Radial Anti Sensory (Base 1st Digit)  29.9C  Wrist 2.4 2.2 0.2 27.5 16.5 40.0 Wrist Base 1st Digit     Ulnar Anti Sensory (5th Digit)  30.3C  Wrist 3.7 3.2 *0.5 25.5 18.2 28.6 Wrist 5th Digit 38 44 6   Motor Left/Right Comparison   Stim Site L Lat (ms) R Lat (ms) L-R Lat (ms) L Amp (mV) R Amp (mV) L-R Amp (%) Site1 Site2 L Vel (m/s) R Vel (m/s) L-R Vel (m/s)  Median Motor (Abd Poll Brev)  30.3C  Wrist *5.0 *6.8 *1.8 6.3 *2.8 *55.6 Elbow Wrist *49 *48 1  Elbow 9.1 11.0 1.9 6.0 2.5 58.3       Ulnar Motor (Abd Dig Min)  30.4C  Wrist 3.0 2.8 0.2 8.3 9.4 11.7 B Elbow Wrist 63 60 3  B Elbow 6.0 5.9 0.1 8.5 10.2 16.7 A Elbow B Elbow 53 56 3  A Elbow 7.9 7.7 0.2 8.0 8.6 7.0          Waveforms:                      Clinical History: No specialty comments available.     Objective:  VS:  HT:    WT:   BMI:     BP:   HR: bpm  TEMP: ( )  RESP:  Physical Exam Musculoskeletal:        General: No swelling, tenderness or deformity.     Comments: Inspection reveals flattening of the bilateral APB but no atrophy of the bilateral APB or FDI or hand intrinsics. There is no swelling, color changes, allodynia or dystrophic changes. There is 5 out of 5 strength in the bilateral wrist extension, finger abduction and long finger flexion. There is decreased sensation to light touch in median nerve peripheral nerve distributions. There is a negative Hoffmann's test bilaterally.  Skin:    General: Skin is warm and dry.     Findings: No erythema or  rash.  Neurological:     General: No focal deficit present.     Mental Status: She is alert and oriented to person, place, and time.     Motor: No weakness or abnormal muscle tone.     Coordination: Coordination normal.  Psychiatric:        Mood and Affect: Mood normal.        Behavior: Behavior normal.      Imaging: No results found.

## 2022-04-05 ENCOUNTER — Encounter (HOSPITAL_BASED_OUTPATIENT_CLINIC_OR_DEPARTMENT_OTHER): Payer: Self-pay | Admitting: Orthopaedic Surgery

## 2022-04-05 ENCOUNTER — Ambulatory Visit (HOSPITAL_BASED_OUTPATIENT_CLINIC_OR_DEPARTMENT_OTHER): Payer: Medicare Other | Admitting: Anesthesiology

## 2022-04-05 ENCOUNTER — Ambulatory Visit (HOSPITAL_BASED_OUTPATIENT_CLINIC_OR_DEPARTMENT_OTHER)
Admission: RE | Admit: 2022-04-05 | Discharge: 2022-04-05 | Disposition: A | Discharge status: Home / Self Care | Payer: Medicare Other | Source: Ambulatory Visit | Attending: Orthopaedic Surgery | Admitting: Orthopaedic Surgery

## 2022-04-05 ENCOUNTER — Other Ambulatory Visit: Payer: Self-pay

## 2022-04-05 ENCOUNTER — Encounter (HOSPITAL_BASED_OUTPATIENT_CLINIC_OR_DEPARTMENT_OTHER)
Admission: RE | Disposition: A | Discharge status: Home / Self Care | Payer: Self-pay | Source: Ambulatory Visit | Attending: Orthopaedic Surgery

## 2022-04-05 DIAGNOSIS — F418 Other specified anxiety disorders: Secondary | ICD-10-CM

## 2022-04-05 DIAGNOSIS — M1990 Unspecified osteoarthritis, unspecified site: Secondary | ICD-10-CM | POA: Diagnosis not present

## 2022-04-05 DIAGNOSIS — J45909 Unspecified asthma, uncomplicated: Secondary | ICD-10-CM | POA: Insufficient documentation

## 2022-04-05 DIAGNOSIS — Z01818 Encounter for other preprocedural examination: Secondary | ICD-10-CM

## 2022-04-05 DIAGNOSIS — G5601 Carpal tunnel syndrome, right upper limb: Secondary | ICD-10-CM | POA: Insufficient documentation

## 2022-04-05 DIAGNOSIS — Z87891 Personal history of nicotine dependence: Secondary | ICD-10-CM | POA: Insufficient documentation

## 2022-04-05 HISTORY — PX: CARPAL TUNNEL RELEASE: SHX101

## 2022-04-05 HISTORY — DX: Anxiety disorder, unspecified: F41.9

## 2022-04-05 SURGERY — CARPAL TUNNEL RELEASE
Anesthesia: Monitor Anesthesia Care | Site: Wrist | Laterality: Right

## 2022-04-05 MED ORDER — CEFAZOLIN SODIUM-DEXTROSE 2-4 GM/100ML-% IV SOLN
INTRAVENOUS | Status: AC
  Filled 2022-04-05: qty 100

## 2022-04-05 MED ORDER — AMISULPRIDE (ANTIEMETIC) 5 MG/2ML IV SOLN: 10.0000 mg | Freq: Once | INTRAVENOUS | Status: DC | PRN

## 2022-04-05 MED ORDER — FENTANYL CITRATE (PF) 100 MCG/2ML IJ SOLN: 25.0000 ug | INTRAMUSCULAR | Status: DC | PRN

## 2022-04-05 MED ORDER — LACTATED RINGERS IV SOLN: INTRAVENOUS | Status: DC

## 2022-04-05 MED ORDER — BUPIVACAINE HCL (PF) 0.25 % IJ SOLN
INTRAMUSCULAR | Status: AC
  Filled 2022-04-05: qty 60

## 2022-04-05 MED ORDER — PROPOFOL 10 MG/ML IV BOLUS
INTRAVENOUS | Status: DC | PRN
  Administered 2022-04-05: 20 mg via INTRAVENOUS

## 2022-04-05 MED ORDER — FENTANYL CITRATE (PF) 100 MCG/2ML IJ SOLN
INTRAMUSCULAR | Status: AC
  Filled 2022-04-05: qty 2

## 2022-04-05 MED ORDER — BUPIVACAINE HCL (PF) 0.25 % IJ SOLN
INTRAMUSCULAR | Status: DC | PRN
  Administered 2022-04-05: 30 mL

## 2022-04-05 MED ORDER — 0.9 % SODIUM CHLORIDE (POUR BTL) OPTIME
TOPICAL | Status: DC | PRN
  Administered 2022-04-05: 100 mL

## 2022-04-05 MED ORDER — FENTANYL CITRATE (PF) 100 MCG/2ML IJ SOLN
INTRAMUSCULAR | Status: DC | PRN
  Administered 2022-04-05 (×2): 25 ug via INTRAVENOUS

## 2022-04-05 MED ORDER — ONDANSETRON HCL 4 MG/2ML IJ SOLN: 4.0000 mg | Freq: Once | INTRAMUSCULAR | Status: DC | PRN

## 2022-04-05 MED ORDER — LACTATED RINGERS IV SOLN
INTRAVENOUS | Status: DC
Start: 2022-04-05 — End: 2022-04-05

## 2022-04-05 MED ORDER — CEFAZOLIN SODIUM-DEXTROSE 2-4 GM/100ML-% IV SOLN
2.0000 g | INTRAVENOUS | Status: AC
  Administered 2022-04-05: 2 g via INTRAVENOUS

## 2022-04-05 MED ORDER — PROPOFOL 500 MG/50ML IV EMUL
INTRAVENOUS | Status: DC | PRN
  Administered 2022-04-05: 100 ug/kg/min via INTRAVENOUS

## 2022-04-05 MED ORDER — HYDROCODONE-ACETAMINOPHEN 5-325 MG PO TABS: 1.0000 | ORAL_TABLET | Freq: Four times a day (QID) | ORAL | 0 refills | Status: DC | PRN

## 2022-04-05 MED ORDER — ACETAMINOPHEN 500 MG PO TABS
ORAL_TABLET | ORAL | Status: AC
  Filled 2022-04-05: qty 2

## 2022-04-05 MED ORDER — ONDANSETRON HCL 4 MG/2ML IJ SOLN
INTRAMUSCULAR | Status: DC | PRN
  Administered 2022-04-05: 4 mg via INTRAVENOUS

## 2022-04-05 MED ORDER — ACETAMINOPHEN 500 MG PO TABS
1000.0000 mg | ORAL_TABLET | Freq: Once | ORAL | Status: AC
  Administered 2022-04-05: 1000 mg via ORAL

## 2022-04-05 MED ORDER — LIDOCAINE-EPINEPHRINE (PF) 1 %-1:200000 IJ SOLN
INTRAMUSCULAR | Status: DC | PRN
  Administered 2022-04-05: 30 mL

## 2022-04-05 SURGICAL SUPPLY — 47 items
BAND INSRT 18 STRL LF DISP RB (MISCELLANEOUS) ×2
BAND RUBBER #18 3X1/16 STRL (MISCELLANEOUS) ×2 IMPLANT
BLADE MINI RND TIP GREEN BEAV (BLADE) ×1 IMPLANT
BLADE SURG 15 STRL LF DISP TIS (BLADE) ×1 IMPLANT
BLADE SURG 15 STRL SS (BLADE) ×1
BNDG CMPR 9X4 STRL LF SNTH (GAUZE/BANDAGES/DRESSINGS) ×1
BNDG ELASTIC 3X5.8 VLCR STR LF (GAUZE/BANDAGES/DRESSINGS) ×1 IMPLANT
BNDG ESMARK 4X9 LF (GAUZE/BANDAGES/DRESSINGS) ×1 IMPLANT
BNDG PLASTER X FAST 3X3 WHT LF (CAST SUPPLIES) IMPLANT
BNDG PLSTR 9X3 FST ST WHT (CAST SUPPLIES)
BRUSH SCRUB EZ PLAIN DRY (MISCELLANEOUS) ×1 IMPLANT
CANISTER SUCT 1200ML W/VALVE (MISCELLANEOUS) ×1 IMPLANT
CORD BIPOLAR FORCEPS 12FT (ELECTRODE) ×1 IMPLANT
COVER BACK TABLE 60X90IN (DRAPES) ×1 IMPLANT
COVER MAYO STAND STRL (DRAPES) ×1 IMPLANT
CUFF TOURN SGL QUICK 18X4 (TOURNIQUET CUFF) IMPLANT
DRAPE EXTREMITY T 121X128X90 (DISPOSABLE) ×1 IMPLANT
DRAPE IMP U-DRAPE 54X76 (DRAPES) ×1 IMPLANT
DRAPE SURG 17X23 STRL (DRAPES) ×1 IMPLANT
GAUZE 4X4 16PLY ~~LOC~~+RFID DBL (SPONGE) IMPLANT
GAUZE SPONGE 4X4 12PLY STRL (GAUZE/BANDAGES/DRESSINGS) ×1 IMPLANT
GAUZE XEROFORM 1X8 LF (GAUZE/BANDAGES/DRESSINGS) ×1 IMPLANT
GLOVE BIOGEL PI IND STRL 7.5 (GLOVE) ×1 IMPLANT
GLOVE ECLIPSE 7.0 STRL STRAW (GLOVE) ×1 IMPLANT
GLOVE INDICATOR 7.0 STRL GRN (GLOVE) ×1 IMPLANT
GLOVE SURG SYN 7.5  E (GLOVE) ×1
GLOVE SURG SYN 7.5 E (GLOVE) ×1 IMPLANT
GLOVE SURG SYN 7.5 PF PI (GLOVE) ×1 IMPLANT
GOWN STRL REUS W/ TWL LRG LVL3 (GOWN DISPOSABLE) ×1 IMPLANT
GOWN STRL REUS W/TWL LRG LVL3 (GOWN DISPOSABLE) ×1
GOWN STRL SURGICAL XL XLNG (GOWN DISPOSABLE) ×2 IMPLANT
NDL HYPO 25X1 1.5 SAFETY (NEEDLE) IMPLANT
NEEDLE HYPO 25X1 1.5 SAFETY (NEEDLE) ×1 IMPLANT
NS IRRIG 1000ML POUR BTL (IV SOLUTION) ×1 IMPLANT
PACK BASIN DAY SURGERY FS (CUSTOM PROCEDURE TRAY) ×1 IMPLANT
PAD CAST 3X4 CTTN HI CHSV (CAST SUPPLIES) ×1 IMPLANT
PADDING CAST COTTON 3X4 STRL (CAST SUPPLIES) ×1
SHEET MEDIUM DRAPE 40X70 STRL (DRAPES) ×1 IMPLANT
SPIKE FLUID TRANSFER (MISCELLANEOUS) IMPLANT
STOCKINETTE 4X48 STRL (DRAPES) ×1 IMPLANT
SUT ETHILON 3 0 PS 1 (SUTURE) ×1 IMPLANT
SYR BULB EAR ULCER 3OZ GRN STR (SYRINGE) ×1 IMPLANT
SYR CONTROL 10ML LL (SYRINGE) IMPLANT
TOWEL GREEN STERILE FF (TOWEL DISPOSABLE) ×1 IMPLANT
TRAY DSU PREP LF (CUSTOM PROCEDURE TRAY) ×1 IMPLANT
TUBE CONNECTING 20X1/4 (TUBING) IMPLANT
UNDERPAD 30X36 HEAVY ABSORB (UNDERPADS AND DIAPERS) ×1 IMPLANT

## 2022-04-05 NOTE — H&P (Signed)
PREOPERATIVE H&P  Chief Complaint: right carpal tunnel syndrome  HPI: [name] is a 81 y.o. female who presents for surgical treatment of right carpal tunnel syndrome.  She denies any changes in medical history.  Past Medical History:  Diagnosis Date   Allergic rhinitis    Anxiety    Asthma    Dysrhythmia    palpitations   Glaucoma    History of kidney stones    Hyperlipemia    Migraines    Palpitations    Pneumonia    hx   Urinary tract infection    Past Surgical History:  Procedure Laterality Date   ABDOMINAL HYSTERECTOMY     Has ovaries   BACK SURGERY  1987   lumb   BREAST SURGERY Left 07/2016   bx- needle in breast   CHOLECYSTECTOMY  09/26/13   CLOSED REDUCTION NASAL FRACTURE  04/29/2012   Procedure: CLOSED REDUCTION NASAL FRACTURE;  Surgeon: Jodi Marble, MD;  Location: [place];  Service: ENT;  Laterality: N/A;  WITH STABILIZATION   COLONOSCOPY     Deviated septum repair     EYE SURGERY Bilateral    cataracts   FOOT SURGERY Bilateral    ingrown toe nails   LITHOTRIPSY     REVERSE SHOULDER ARTHROPLASTY Right 09/15/2016   Procedure: RIGHT REVERSE SHOULDER ARTHROPLASTY;  Surgeon: Netta Cedars, MD;  Location: [place];  Service: Orthopedics;  Laterality: Right;   REVERSE SHOULDER ARTHROPLASTY Left 10/15/2020   Procedure: REVERSE SHOULDER ARTHROPLASTY;  Surgeon: Netta Cedars, MD;  Location: [facility];  Service: Orthopedics;  Laterality: Left;   ROTATOR CUFF REPAIR     right   TONSILLECTOMY     Social History   Socioeconomic History   Marital status: Widowed    Spouse name: Not on file   Number of children: 2   Years of education: Not on file   Highest education level: Not on file  Occupational History   Occupation: Retired -Sports coach: RETIRED  Tobacco Use   Smoking status: Former    Packs/day: 0.10    Years: 4.00    Total pack years: 0.40    Types: Cigarettes    Quit date: 06/19/1986    Years since quitting: 35.8    Smokeless tobacco: Never   Tobacco comments:    smoked in college years ago  Vaping Use   Vaping Use: Never used  Substance and Sexual Activity   Alcohol use: Yes    Comment: 1-2 glasses week variety beer occ scotch   Drug use: No   Sexual activity: Not Currently    Birth control/protection: Surgical  Other Topics Concern   Not on file  Social History Narrative   Daily caffeine    Social Determinants of Health   Financial Resource Strain: Low Risk  (11/03/2021)   Overall Financial Resource Strain (CARDIA)    Difficulty of Paying Living Expenses: Not hard at all  Food Insecurity: No Food Insecurity (11/03/2021)   Hunger Vital Sign    Worried About Running Out of Food in the Last Year: Never true    [place] in the Last Year: Never true  Transportation Needs: No Transportation Needs (11/03/2021)   PRAPARE - Hydrologist (Medical): No    Lack of Transportation (Non-Medical): No  Physical Activity: Sufficiently Active (11/03/2021)   Exercise Vital Sign    Days of Exercise per Week: 3 days  Minutes of Exercise per Session: 50 min  Stress: No Stress Concern Present (11/03/2021)   [place]    Feeling of Stress : Not at all  Social Connections: Moderately Integrated (11/03/2021)   Social Connection and Isolation Panel [NHANES]    Frequency of Communication with Friends and Family: More than three times a week    Frequency of Social Gatherings with Friends and Family: More than three times a week    Attends Religious Services: More than 4 times per year    Active Member of [employer] or Organizations: Yes    Attends Archivist Meetings: 1 to 4 times per year    Marital Status: Widowed   Family History  Problem Relation Age of Onset   Diabetes Father    Colon cancer Neg Hx    Esophageal cancer Neg Hx    Rectal cancer Neg Hx    Stomach cancer Neg Hx    Breast cancer Neg Hx     Allergies  Allergen Reactions   Papain Anaphylaxis    Takes an extreme concentration   Papaya Derivatives Anaphylaxis   Mushroom Ext Cmplx(Shiitake-Reishi-Mait) Other (See Comments)    Head congestion and sore throat   Mushroom Extract Complex Other (See Comments) and Nausea Only    Head congestion and sore throat after eating 2 days in row   Venlafaxine Other (See Comments) and Rash    UNSPECIFIED REACTION  Pt states she can take Name brand. Lethargy   Prior to Admission medications   Medication Sig Start Date End Date Taking? Authorizing Provider  albuterol (VENTOLIN HFA) 108 (90 Base) MCG/ACT inhaler Inhale into the lungs every 6 (six) hours as needed for wheezing or shortness of breath.   Yes [provider]  atorvastatin (LIPITOR) 20 MG tablet TAKE ONE (1) TABLET BY MOUTH EVERY DAY 12/12/21  Yes Midge Minium, MD  Biotin 5000 MCG CAPS Take 5,000 mcg by mouth daily.   Yes [provider]  CALCIUM PO Take 1 capsule by mouth daily.   Yes [provider]  citalopram (CELEXA) 40 MG tablet TAKE ONE (1) TABLET BY MOUTH EVERY DAY 02/07/22  Yes Midge Minium, MD  ibuprofen (ADVIL) 200 MG tablet Take 200 mg by mouth every 6 (six) hours as needed.   Yes [provider]  LUMIGAN 0.01 % SOLN Place 1 drop into both eyes at bedtime.  09/07/14  Yes [provider]  Melatonin 10 MG TABS Take by mouth.   Yes [provider]  montelukast (SINGULAIR) 10 MG tablet TAKE 1 TABLET BY MOUTH AT BEDTIME 02/07/22  Yes Midge Minium, MD  Multiple Vitamins-Minerals (CENTRUM SILVER PO) Take 1 tablet by mouth daily.   Yes [provider]  budesonide (ENTOCORT EC) 3 MG 24 hr capsule TAKE TWO CAPSULES BY MOUTH EACH DAY 03/15/22   Levin Erp, PA     Positive ROS: All other systems have been reviewed and were otherwise negative with the exception of those mentioned in the HPI and as above.  Physical Exam: General: Alert, no  acute distress Cardiovascular: No pedal edema Respiratory: No cyanosis, no use of accessory musculature GI: abdomen soft Skin: No lesions in the area of chief complaint Neurologic: Sensation intact distally Psychiatric: Patient is competent for consent with normal mood and affect Lymphatic: no lymphedema  MUSCULOSKELETAL: exam stable  Assessment: right carpal tunnel syndrome  Plan: Plan for Procedure(s): RIGHT CARPAL TUNNEL RELEASE  The  risks benefits and alternatives were discussed with the patient including but not limited to the risks of nonoperative treatment, versus surgical intervention including infection, bleeding, nerve injury,  blood clots, cardiopulmonary complications, morbidity, mortality, among others, and they were willing to proceed.   Eduard Roux, MD 04/05/2022 12:04 PM

## 2022-04-05 NOTE — Discharge Instructions (Addendum)
Postoperative instructions:  Weightbearing instructions: no lifting more than 10 lbs  Dressing instructions: Keep your dressing and/or splint clean and dry at all times.  It will be removed at your first post-operative appointment.  Your stitches and/or staples will be removed at this visit.  Incision instructions:  Do not soak your incision for 3 weeks after surgery.  If the incision gets wet, pat dry and do not scrub the incision.  Pain control:  You have been given a prescription to be taken as directed for post-operative pain control.  In addition, elevate the operative extremity above the heart at all times to prevent swelling and throbbing pain.  Take over-the-counter Colace, '100mg'$  by mouth twice a day while taking narcotic pain medications to help prevent constipation.  Follow up appointments: 1) 7 days for  wound check. 2) Dr. Erlinda Hong as scheduled.   -------------------------------------------------------------------------------------------------------------  After Surgery Pain Control:  After your surgery, post-surgical discomfort or pain is likely. This discomfort can last several days to a few weeks. At certain times of the day your discomfort may be more intense.  Did you receive a nerve block?  A nerve block can provide pain relief for one hour to two days after your surgery. As long as the nerve block is working, you will experience little or no sensation in the area the surgeon operated on.  As the nerve block wears off, you will begin to experience pain or discomfort. It is very important that you begin taking your prescribed pain medication before the nerve block fully wears off. Treating your pain at the first sign of the block wearing off will ensure your pain is better controlled and more tolerable when full-sensation returns. Do not wait until the pain is intolerable, as the medicine will be less effective. It is better to treat pain in advance than to try and catch up.   General Anesthesia:  If you did not receive a nerve block during your surgery, you will need to start taking your pain medication shortly after your surgery and should continue to do so as prescribed by your surgeon.  Pain Medication:  Most commonly we prescribe Vicodin and Percocet for post-operative pain. Both of these medications contain a combination of acetaminophen (Tylenol) and a narcotic to help control pain.   It takes between 30 and 45 minutes before pain medication starts to work. It is important to take your medication before your pain level gets too intense.   Nausea is a common side effect of many pain medications. You will want to eat something before taking your pain medicine to help prevent nausea.   If you are taking a prescription pain medication that contains acetaminophen, we recommend that you do not take additional over the counter acetaminophen (Tylenol).  Other pain relieving options:   Using a cold pack to ice the affected area a few times a day (15 to 20 minutes at a time) can help to relieve pain, reduce swelling and bruising.   Elevation of the affected area can also help to reduce pain and swelling.   Post Anesthesia Home Care Instructions  Activity: Get plenty of rest for the remainder of the day. A responsible individual must stay with you for 24 hours following the procedure.  For the next 24 hours, DO NOT: -Drive a car -Paediatric nurse -Drink alcoholic beverages -Take any medication unless instructed by your physician -Make any legal decisions or sign important papers.  Meals: Start with liquid foods such as gelatin  or soup. Progress to regular foods as tolerated. Avoid greasy, spicy, heavy foods. If nausea and/or vomiting occur, drink only clear liquids until the nausea and/or vomiting subsides. Call your physician if vomiting continues.  Special Instructions/Symptoms: Your throat may feel dry or sore from the anesthesia or the breathing tube  placed in your throat during surgery. If this causes discomfort, gargle with warm salt water. The discomfort should disappear within 24 hours.

## 2022-04-05 NOTE — Anesthesia Preprocedure Evaluation (Addendum)
Anesthesia Evaluation  Patient identified by MRN, date of birth, ID band Patient awake    Reviewed: Allergy & Precautions, NPO status , Patient's Chart, lab work & pertinent test results  Airway Mallampati: II  TM Distance: >3 FB Neck ROM: Full    Dental no notable dental hx.    Pulmonary asthma , former smoker,    Pulmonary exam normal        Cardiovascular negative cardio ROS Normal cardiovascular exam     Neuro/Psych  Headaches, PSYCHIATRIC DISORDERS Anxiety Depression  Neuromuscular disease    GI/Hepatic negative GI ROS, Neg liver ROS,   Endo/Other  negative endocrine ROS  Renal/GU negative Renal ROS     Musculoskeletal  (+) Arthritis ,   Abdominal   Peds  Hematology negative hematology ROS (+)   Anesthesia Other Findings right carpal tunnel syndrome  Reproductive/Obstetrics                            Anesthesia Physical Anesthesia Plan  ASA: 2  Anesthesia Plan: MAC   Post-op Pain Management:    Induction: Intravenous  PONV Risk Score and Plan: 2 and Ondansetron, Dexamethasone, Propofol infusion and Treatment may vary due to age or medical condition  Airway Management Planned: Simple Face Mask  Additional Equipment:   Intra-op Plan:   Post-operative Plan:   Informed Consent: I have reviewed the patients History and Physical, chart, labs and discussed the procedure including the risks, benefits and alternatives for the proposed anesthesia with the patient or authorized representative who has indicated his/her understanding and acceptance.     Dental advisory given  Plan Discussed with: CRNA  Anesthesia Plan Comments:        Anesthesia Quick Evaluation

## 2022-04-05 NOTE — Anesthesia Postprocedure Evaluation (Signed)
Anesthesia Post Note  Patient: AAMANI MOOSE  Procedure(s) Performed: RIGHT CARPAL TUNNEL RELEASE (Right: Wrist)     Patient location during evaluation: PACU Anesthesia Type: MAC Level of consciousness: awake Pain management: pain level controlled Vital Signs Assessment: post-procedure vital signs reviewed and stable Respiratory status: spontaneous breathing, nonlabored ventilation, respiratory function stable and patient connected to nasal cannula oxygen Cardiovascular status: stable and blood pressure returned to baseline Postop Assessment: no apparent nausea or vomiting Anesthetic complications: no   No notable events documented.  Last Vitals:  Vitals:   04/05/22 1500 04/05/22 1512  BP: (!) 120/97 131/76  Pulse: 68 71  Resp: (!) 21 18  Temp:  (!) 36.3 C  SpO2: 100% 96%    Last Pain:  Vitals:   04/05/22 1512  TempSrc: Oral  PainSc:                  Mozell Haber P [place]

## 2022-04-05 NOTE — Op Note (Signed)
   Carpal tunnel op note  DATE OF SURGERY:04/05/2022  PREOPERATIVE DIAGNOSIS:  Right carpal tunnel syndrome  POSTOPERATIVE DIAGNOSIS: same  PROCEDURE: Right carpal tunnel release. CPT 76160  SURGEON: Marianna Payment, M.D.  ASSIST: Madalyn Rob, [place]  ANESTHESIA:  Local and MAC  TOURNIQUET TIME: less than 20 minutes  BLOOD LOSS: Minimal.  COMPLICATIONS: None.  PATHOLOGY: None.  INDICATIONS: The patient is a 81 y.o. -year-old female who presented with carpal tunnel syndrome failing nonsurgical management, indicated for surgical release.  DESCRIPTION OF PROCEDURE: The patient was identified in the preoperative holding area.  The operative site was marked by the surgeon and confirmed by the patient.  The patient was brought back to the operating room.  MAC anesthesia was administered.  Local anesthetic with epi was injected into the operative site.  A well padded nonsterile tourniquet was placed. The operative extremity was prepped and draped in standard sterile fashion.  A timeout was performed.  Preoperative antibiotics were given.   A palmar incision was made about 5 mm ulnar to the thenar crease.  The palmar aponeurosis was exposed and divided in line with the skin incision. The palmaris brevis was visualized and divided.  The distal edge of the transcarpal ligament was identified. A hemostat was inserted into the carpal tunnel to protect the median nerve and the flexor tendons. Then, the transverse carpal ligament was released under direct visualization. Proximally, a subcutaneous tunnel was made allowing a Sewell retractor to be placed. Then, the distal portion of the antebrachial fascia was released. Distally, all fibrous bands were released. The median nerve was visualized, and the fat pad was exposed. Wound was irrigated and closed with 4-0 nylon sutures. Sterile dressing applied. The patient was transferred to the recovery room in stable condition after all counts were  correct.  POSTOPERATIVE PLAN: To start nerve gliding exercises as tolerated and no heavy lifting for four weeks.  Eduard Roux, M.D. [facility] 2:34 PM

## 2022-04-05 NOTE — Transfer of Care (Signed)
Immediate Anesthesia Transfer of Care Note  Patient: [name]  Procedure(s) Performed: RIGHT CARPAL TUNNEL RELEASE (Right: Wrist)  Patient Location: PACU  Anesthesia Type:MAC  Level of Consciousness: awake, alert  and oriented  Airway & Oxygen Therapy: Patient Spontanous Breathing  Post-op Assessment: Report given to RN and Post -op Vital signs reviewed and stable  Post vital signs: Reviewed and stable  Last Vitals:  Vitals Value Taken Time  BP 149/133 04/05/22 1445  Temp    Pulse 73 04/05/22 1446  Resp 19 04/05/22 1446  SpO2 93 % 04/05/22 1446  Vitals shown include unvalidated device data.  Last Pain:  Vitals:   04/05/22 1208  TempSrc: Oral  PainSc: 4       Patients Stated Pain Goal: 8 (45/80/99 8338)  Complications: No notable events documented.

## 2022-04-06 ENCOUNTER — Encounter (HOSPITAL_BASED_OUTPATIENT_CLINIC_OR_DEPARTMENT_OTHER): Payer: Self-pay | Admitting: Orthopaedic Surgery

## 2022-04-11 DIAGNOSIS — M1711 Unilateral primary osteoarthritis, right knee: Secondary | ICD-10-CM | POA: Diagnosis not present

## 2022-04-11 DIAGNOSIS — M160 Bilateral primary osteoarthritis of hip: Secondary | ICD-10-CM | POA: Diagnosis not present

## 2022-04-11 DIAGNOSIS — M25551 Pain in right hip: Secondary | ICD-10-CM | POA: Diagnosis not present

## 2022-04-11 DIAGNOSIS — M25561 Pain in right knee: Secondary | ICD-10-CM | POA: Diagnosis not present

## 2022-04-12 ENCOUNTER — Encounter: Payer: Self-pay | Admitting: Orthopaedic Surgery

## 2022-04-12 ENCOUNTER — Ambulatory Visit (INDEPENDENT_AMBULATORY_CARE_PROVIDER_SITE_OTHER): Payer: Medicare Other | Admitting: Orthopaedic Surgery

## 2022-04-12 DIAGNOSIS — G5601 Carpal tunnel syndrome, right upper limb: Secondary | ICD-10-CM

## 2022-04-12 NOTE — Progress Notes (Signed)
Post-Op Visit Note   Patient: [name]           Date of Birth: Sep 02, 1940           MRN: [id number] Visit Date: 04/12/2022 PCP: Midge Minium, MD   Assessment & Plan:  Chief Complaint:  Chief Complaint  Patient presents with   Right Wrist - Follow-up    Right carpal tunnel release 04/05/2022   Visit Diagnoses:  1. Right carpal tunnel syndrome     Plan: Clydia is 1 week status post right carpal tunnel release.  She is doing great.  Reports significant improvement in her symptoms from the carpal tunnel.  No complaints.  Examination of right hand shows intact surgical incision.  No signs of infection or drainage.  Neurovascular intact.  She can make a full composite fist.  The surgical bandages were removed and reapplied a dry Band-Aid.  We will have her come back next week for suture removal.  Activity restrictions reviewed.  Jackelyn Poling will call her in the near future to look at scheduling surgery for the left carpal tunnel for the week after Thanksgiving.  Follow-Up Instructions: Return in about 1 week (around 04/19/2022).   Orders:  No orders of the defined types were placed in this encounter.  No orders of the defined types were placed in this encounter.   Imaging: No results found.  PMFS History: Patient Active Problem List   Diagnosis Date Noted   Right carpal tunnel syndrome 04/04/2022   Left carpal tunnel syndrome 04/04/2022   Pain of left hip joint 06/03/2021   Gastro-esophageal reflux disease without esophagitis 10/15/2020   Disorders of muscle in diseases classified elsewhere, multiple sites 10/15/2020   Other muscle spasm 10/15/2020   Post-traumatic osteoarthritis 08/10/2020   Overweight (BMI 25.0-29.9) 06/01/2020   Pain in left knee 05/27/2018   Thyroid nodule 03/13/2018   Chronic diarrhea 12/26/2017   S/P shoulder replacement, right 09/15/2016   Benign paroxysmal positional vertigo 10/14/2014   Back pain 08/27/2014   Decreased hearing of both  ears 05/19/2014   S/P cholecystectomy 10/02/2013   Asthma, moderate persistent 05/16/2013   H/O recurrent pneumonia 05/16/2013   Insomnia 05/05/2013   Gluten intolerance 05/06/2012   Renal calculus, left 01/29/2012   Painful bladder spasm 12/14/2011   Lactose intolerance 12/14/2011   Post-menopausal 12/14/2011   Microscopic colitis 11/08/2011   Hepatic steatosis 08/25/2011   Pulmonary nodule, right 08/25/2011   Hematuria 08/23/2011   Memory loss 12/12/2010   Diabetes mellitus type II, controlled, with no complications ([place]) 25/95/6387   Hyperlipidemia 07/14/2010   Depression with anxiety 07/14/2010   GLAUCOMA 07/14/2010   Allergic rhinitis 07/14/2010   Past Medical History:  Diagnosis Date   Allergic rhinitis    Anxiety    Asthma    Dysrhythmia    palpitations   Glaucoma    History of kidney stones    Hyperlipemia    Migraines    Palpitations    Pneumonia    hx   Urinary tract infection     Family History  Problem Relation Age of Onset   Diabetes Father    Colon cancer Neg Hx    Esophageal cancer Neg Hx    Rectal cancer Neg Hx    Stomach cancer Neg Hx    Breast cancer Neg Hx     Past Surgical History:  Procedure Laterality Date   ABDOMINAL HYSTERECTOMY     Has ovaries   [place]  lumb   BREAST SURGERY Left 07/2016   bx- needle in breast   CARPAL TUNNEL RELEASE Right 04/05/2022   Procedure: RIGHT CARPAL TUNNEL RELEASE;  Surgeon: Leandrew Koyanagi, MD;  Location: [place];  Service: Orthopedics;  Laterality: Right;   CHOLECYSTECTOMY  09/26/13   CLOSED REDUCTION NASAL FRACTURE  04/29/2012   Procedure: CLOSED REDUCTION NASAL FRACTURE;  Surgeon: Jodi Marble, MD;  Location: [place];  Service: ENT;  Laterality: N/A;  WITH STABILIZATION   COLONOSCOPY     Deviated septum repair     EYE SURGERY Bilateral    cataracts   FOOT SURGERY Bilateral    ingrown toe nails   LITHOTRIPSY     REVERSE SHOULDER ARTHROPLASTY Right  09/15/2016   Procedure: RIGHT REVERSE SHOULDER ARTHROPLASTY;  Surgeon: Netta Cedars, MD;  Location: [place];  Service: Orthopedics;  Laterality: Right;   REVERSE SHOULDER ARTHROPLASTY Left 10/15/2020   Procedure: REVERSE SHOULDER ARTHROPLASTY;  Surgeon: Netta Cedars, MD;  Location: [facility];  Service: Orthopedics;  Laterality: Left;   ROTATOR CUFF REPAIR     right   TONSILLECTOMY     Social History   Occupational History   Occupation: Retired -Sports coach: RETIRED  Tobacco Use   Smoking status: Former    Packs/day: 0.10    Years: 4.00    Total pack years: 0.40    Types: Cigarettes    Quit date: 06/19/1986    Years since quitting: 35.8   Smokeless tobacco: Never   Tobacco comments:    smoked in college years ago  Vaping Use   Vaping Use: Never used  Substance and Sexual Activity   Alcohol use: Yes    Comment: 1-2 glasses week variety beer occ scotch   Drug use: No   Sexual activity: Not Currently    Birth control/protection: Surgical

## 2022-04-13 NOTE — Telephone Encounter (Signed)
[name] from [employer] called stating that she hasn't received the fax yet. [name] wants to know if it can be refax. Fax [phone]

## 2022-04-13 NOTE — Telephone Encounter (Signed)
I will have to get them out of the scan I faxed them to the home office because it was not fax number on forms

## 2022-04-13 NOTE — Telephone Encounter (Signed)
Faxed to [phone]

## 2022-04-19 ENCOUNTER — Ambulatory Visit (INDEPENDENT_AMBULATORY_CARE_PROVIDER_SITE_OTHER): Payer: Medicare Other | Admitting: Physician Assistant

## 2022-04-19 DIAGNOSIS — Z9889 Other specified postprocedural states: Secondary | ICD-10-CM

## 2022-04-19 DIAGNOSIS — M25551 Pain in right hip: Secondary | ICD-10-CM | POA: Diagnosis not present

## 2022-04-19 DIAGNOSIS — G5601 Carpal tunnel syndrome, right upper limb: Secondary | ICD-10-CM

## 2022-04-19 NOTE — Progress Notes (Signed)
Post-Op Visit Note   Patient: [name]           Date of Birth: [birth year]           MRN: [id number] Visit Date: 04/19/2022 PCP: Midge Minium, MD   Assessment & Plan:  Chief Complaint:  Chief Complaint  Patient presents with   Right Hand - Routine Post Op   Visit Diagnoses:  1. S/P carpal tunnel release   2. Right carpal tunnel syndrome     Plan: Patient is a pleasant 81 year old female who comes in today 2 weeks status post right carpal tunnel release 04/05/2022.  She has been doing well.  No pain and no paresthesias to the right hand.  Examination right hand reveals a well-healing surgical incision with nylon sutures in place.  No evidence of infection or cellulitis.  Fingers are warm and well-perfused.  Today, sutures removed and Steri-Strips applied.  No heavy lifting or submerging her hand underwater for another 2 weeks.  She will follow-up with [place] in 4 weeks for recheck.  In the meantime, we will have Debbie reach out to her to schedule left carpal tunnel release.  Follow-Up Instructions: Return in about 4 weeks (around 05/17/2022).   Orders:  No orders of the defined types were placed in this encounter.  No orders of the defined types were placed in this encounter.   Imaging: No new imaging  PMFS History: Patient Active Problem List   Diagnosis Date Noted   Right carpal tunnel syndrome 04/04/2022   Left carpal tunnel syndrome 04/04/2022   Pain of left hip joint 06/03/2021   Gastro-esophageal reflux disease without esophagitis 10/15/2020   Disorders of muscle in diseases classified elsewhere, multiple sites 10/15/2020   Other muscle spasm 10/15/2020   Post-traumatic osteoarthritis 08/10/2020   Overweight (BMI 25.0-29.9) 06/01/2020   Pain in left knee 05/27/2018   Thyroid nodule 03/13/2018   Chronic diarrhea 12/26/2017   S/P shoulder replacement, right 09/15/2016   Benign paroxysmal positional vertigo 10/14/2014   Back pain 08/27/2014   Decreased  hearing of both ears 05/19/2014   S/P cholecystectomy 10/02/2013   Asthma, moderate persistent 05/16/2013   H/O recurrent pneumonia 05/16/2013   Insomnia 05/05/2013   Gluten intolerance 05/06/2012   Renal calculus, left 01/29/2012   Painful bladder spasm 12/14/2011   Lactose intolerance 12/14/2011   Post-menopausal 12/14/2011   Microscopic colitis 11/08/2011   Hepatic steatosis 08/25/2011   Pulmonary nodule, right 08/25/2011   Hematuria 08/23/2011   Memory loss 12/12/2010   Diabetes mellitus type II, controlled, with no complications ([place]) 25/10/3974   Hyperlipidemia 07/14/2010   Depression with anxiety 07/14/2010   GLAUCOMA 07/14/2010   Allergic rhinitis 07/14/2010   Past Medical History:  Diagnosis Date   Allergic rhinitis    Anxiety    Asthma    Dysrhythmia    palpitations   Glaucoma    History of kidney stones    Hyperlipemia    Migraines    Palpitations    Pneumonia    hx   Urinary tract infection     Family History  Problem Relation Age of Onset   Diabetes Father    Colon cancer Neg Hx    Esophageal cancer Neg Hx    Rectal cancer Neg Hx    Stomach cancer Neg Hx    Breast cancer Neg Hx     Past Surgical History:  Procedure Laterality Date   ABDOMINAL HYSTERECTOMY     Has ovaries   BACK  SURGERY  1987   lumb   BREAST SURGERY Left 07/2016   bx- needle in breast   CARPAL TUNNEL RELEASE Right 04/05/2022   Procedure: RIGHT CARPAL TUNNEL RELEASE;  Surgeon: Leandrew Koyanagi, MD;  Location: [place];  Service: Orthopedics;  Laterality: Right;   CHOLECYSTECTOMY  09/26/13   CLOSED REDUCTION NASAL FRACTURE  04/29/2012   Procedure: CLOSED REDUCTION NASAL FRACTURE;  Surgeon: Jodi Marble, MD;  Location: [place];  Service: ENT;  Laterality: N/A;  WITH STABILIZATION   COLONOSCOPY     Deviated septum repair     EYE SURGERY Bilateral    cataracts   FOOT SURGERY Bilateral    ingrown toe nails   LITHOTRIPSY     REVERSE SHOULDER  ARTHROPLASTY Right 09/15/2016   Procedure: RIGHT REVERSE SHOULDER ARTHROPLASTY;  Surgeon: Netta Cedars, MD;  Location: [place];  Service: Orthopedics;  Laterality: Right;   REVERSE SHOULDER ARTHROPLASTY Left 10/15/2020   Procedure: REVERSE SHOULDER ARTHROPLASTY;  Surgeon: Netta Cedars, MD;  Location: [facility];  Service: Orthopedics;  Laterality: Left;   ROTATOR CUFF REPAIR     right   TONSILLECTOMY     Social History   Occupational History   Occupation: Retired -Sports coach: RETIRED  Tobacco Use   Smoking status: Former    Packs/day: 0.10    Years: 4.00    Total pack years: 0.40    Types: Cigarettes    Quit date: 06/19/1986    Years since quitting: 35.8   Smokeless tobacco: Never   Tobacco comments:    smoked in college years ago  Vaping Use   Vaping Use: Never used  Substance and Sexual Activity   Alcohol use: Yes    Comment: 1-2 glasses week variety beer occ scotch   Drug use: No   Sexual activity: Not Currently    Birth control/protection: Surgical

## 2022-04-20 ENCOUNTER — Telehealth: Payer: Self-pay | Admitting: Orthopaedic Surgery

## 2022-04-20 NOTE — Telephone Encounter (Signed)
Please call [facility] and clarify instructions for rehab, OT .Marland [phone], fax..[phone].

## 2022-04-20 NOTE — Telephone Encounter (Signed)
I didn't know that we had sent her to hand therapy

## 2022-04-21 ENCOUNTER — Other Ambulatory Visit: Payer: Self-pay | Admitting: Physician Assistant

## 2022-04-21 MED ORDER — MUPIROCIN 2 % EX OINT: TOPICAL_OINTMENT | CUTANEOUS | 0 refills | Status: AC

## 2022-04-21 NOTE — Telephone Encounter (Signed)
Called and relayed information to patient. Scheduled appointment for next week as well.

## 2022-04-21 NOTE — Telephone Encounter (Signed)
I have sent in mupirocin.  I would like for her to cover with mupirocin and a band-aid twice daily until completely healed.  Lets also have her come back next week for recheck

## 2022-04-24 DIAGNOSIS — Z23 Encounter for immunization: Secondary | ICD-10-CM | POA: Diagnosis not present

## 2022-04-27 ENCOUNTER — Encounter: Payer: Self-pay | Admitting: Orthopaedic Surgery

## 2022-04-27 ENCOUNTER — Ambulatory Visit (INDEPENDENT_AMBULATORY_CARE_PROVIDER_SITE_OTHER): Payer: Medicare Other | Admitting: Orthopaedic Surgery

## 2022-04-27 DIAGNOSIS — Z9889 Other specified postprocedural states: Secondary | ICD-10-CM

## 2022-04-27 NOTE — Progress Notes (Signed)
Post-Op Visit Note   Patient: [name]           Date of Birth: 1941-02-11           MRN: [id number] Visit Date: 04/27/2022 PCP: Midge Minium, MD   Assessment & Plan:  Chief Complaint:  Chief Complaint  Patient presents with   Right Wrist - Follow-up    Right carpal tunnel release 04/05/2022   Visit Diagnoses:  1. S/P carpal tunnel release     Plan: Patient is a pleasant 81 year old female who comes in today 3 weeks status post right carpal tunnel release.  She is here for a wound check.  After her sutures were removed last week her wound started to split apart.  She has been applying mupirocin which has significantly helped.  She denies any pain, paresthesias, fevers or chills or any other constitutional symptoms.  Examination of her right hand reveals a well approximating wound.  No evidence of infection or cellulitis.  She is neurovascular intact distally.  At this point, would like for her to continue with the mupirocin and a Band-Aid twice a day.  No heavy lifting or submerging her hand underwater.  Follow-up with [place] early Thanksgiving week for recheck.  Call with concerns or questions in the meantime.  Follow-Up Instructions: Return in about 2 weeks (around 05/11/2022).   Orders:  No orders of the defined types were placed in this encounter.  No orders of the defined types were placed in this encounter.   Imaging: No new imaging  PMFS History: Patient Active Problem List   Diagnosis Date Noted   Right carpal tunnel syndrome 04/04/2022   Left carpal tunnel syndrome 04/04/2022   Pain of left hip joint 06/03/2021   Gastro-esophageal reflux disease without esophagitis 10/15/2020   Disorders of muscle in diseases classified elsewhere, multiple sites 10/15/2020   Other muscle spasm 10/15/2020   Post-traumatic osteoarthritis 08/10/2020   Overweight (BMI 25.0-29.9) 06/01/2020   Pain in left knee 05/27/2018   Thyroid nodule 03/13/2018   Chronic diarrhea  12/26/2017   S/P shoulder replacement, right 09/15/2016   Benign paroxysmal positional vertigo 10/14/2014   Back pain 08/27/2014   Decreased hearing of both ears 05/19/2014   S/P cholecystectomy 10/02/2013   Asthma, moderate persistent 05/16/2013   H/O recurrent pneumonia 05/16/2013   Insomnia 05/05/2013   Gluten intolerance 05/06/2012   Renal calculus, left 01/29/2012   Painful bladder spasm 12/14/2011   Lactose intolerance 12/14/2011   Post-menopausal 12/14/2011   Microscopic colitis 11/08/2011   Hepatic steatosis 08/25/2011   Pulmonary nodule, right 08/25/2011   Hematuria 08/23/2011   Memory loss 12/12/2010   Diabetes mellitus type II, controlled, with no complications ([place]) 06/11/8249   Hyperlipidemia 07/14/2010   Depression with anxiety 07/14/2010   GLAUCOMA 07/14/2010   Allergic rhinitis 07/14/2010   Past Medical History:  Diagnosis Date   Allergic rhinitis    Anxiety    Asthma    Dysrhythmia    palpitations   Glaucoma    History of kidney stones    Hyperlipemia    Migraines    Palpitations    Pneumonia    hx   Urinary tract infection     Family History  Problem Relation Age of Onset   Diabetes Father    Colon cancer Neg Hx    Esophageal cancer Neg Hx    Rectal cancer Neg Hx    Stomach cancer Neg Hx    Breast cancer Neg Hx  Past Surgical History:  Procedure Laterality Date   ABDOMINAL HYSTERECTOMY     Has ovaries   BACK SURGERY  1987   lumb   BREAST SURGERY Left 07/2016   bx- needle in breast   CARPAL TUNNEL RELEASE Right 04/05/2022   Procedure: RIGHT CARPAL TUNNEL RELEASE;  Surgeon: Leandrew Koyanagi, MD;  Location: [place];  Service: Orthopedics;  Laterality: Right;   CHOLECYSTECTOMY  09/26/13   CLOSED REDUCTION NASAL FRACTURE  04/29/2012   Procedure: CLOSED REDUCTION NASAL FRACTURE;  Surgeon: Jodi Marble, MD;  Location: [place];  Service: ENT;  Laterality: N/A;  WITH STABILIZATION   COLONOSCOPY     Deviated  septum repair     EYE SURGERY Bilateral    cataracts   FOOT SURGERY Bilateral    ingrown toe nails   LITHOTRIPSY     REVERSE SHOULDER ARTHROPLASTY Right 09/15/2016   Procedure: RIGHT REVERSE SHOULDER ARTHROPLASTY;  Surgeon: Netta Cedars, MD;  Location: [place];  Service: Orthopedics;  Laterality: Right;   REVERSE SHOULDER ARTHROPLASTY Left 10/15/2020   Procedure: REVERSE SHOULDER ARTHROPLASTY;  Surgeon: Netta Cedars, MD;  Location: [facility];  Service: Orthopedics;  Laterality: Left;   ROTATOR CUFF REPAIR     right   TONSILLECTOMY     Social History   Occupational History   Occupation: Retired -Sports coach: RETIRED  Tobacco Use   Smoking status: Former    Packs/day: 0.10    Years: 4.00    Total pack years: 0.40    Types: Cigarettes    Quit date: 06/19/1986    Years since quitting: 35.8   Smokeless tobacco: Never   Tobacco comments:    smoked in college years ago  Vaping Use   Vaping Use: Never used  Substance and Sexual Activity   Alcohol use: Yes    Comment: 1-2 glasses week variety beer occ scotch   Drug use: No   Sexual activity: Not Currently    Birth control/protection: Surgical

## 2022-05-08 NOTE — Telephone Encounter (Signed)
Will fax to the new number that is in the note once we receive the forms

## 2022-05-08 NOTE — Telephone Encounter (Signed)
Fax number should be [phone]  [place] called again asking for this to be refaxed she is resending for new signature as we likely do not have this form any longer

## 2022-05-09 ENCOUNTER — Ambulatory Visit (INDEPENDENT_AMBULATORY_CARE_PROVIDER_SITE_OTHER): Payer: Medicare Other | Admitting: Orthopaedic Surgery

## 2022-05-09 ENCOUNTER — Other Ambulatory Visit: Payer: Self-pay | Admitting: Family Medicine

## 2022-05-09 DIAGNOSIS — Z9889 Other specified postprocedural states: Secondary | ICD-10-CM

## 2022-05-09 NOTE — Progress Notes (Signed)
Post-Op Visit Note   Patient: [name]           Date of Birth: 1940-12-12           MRN: [id number] Visit Date: 05/09/2022 PCP: Midge Minium, MD   Assessment & Plan:  Chief Complaint:  Chief Complaint  Patient presents with   Right Hand - Routine Post Op   Visit Diagnoses:  1. S/P carpal tunnel release     Plan: Casady is status post right carpal tunnel release on 04/05/2022.  She has been doing well overall has no complaints.  The right hand shows a fully healed surgical scar.  Slight tenderness to palpation.  No signs of infection.  She has good use and function of the right hand.  She can make a full composite fist  At this point Kashawn can steadily increase activity to the right hand as tolerated.  Her left carpal tunnel is scheduled for early January.  Follow-Up Instructions: No follow-ups on file.   Orders:  No orders of the defined types were placed in this encounter.  No orders of the defined types were placed in this encounter.   Imaging: No results found.  PMFS History: Patient Active Problem List   Diagnosis Date Noted   Right carpal tunnel syndrome 04/04/2022   Left carpal tunnel syndrome 04/04/2022   Pain of left hip joint 06/03/2021   Gastro-esophageal reflux disease without esophagitis 10/15/2020   Disorders of muscle in diseases classified elsewhere, multiple sites 10/15/2020   Other muscle spasm 10/15/2020   Post-traumatic osteoarthritis 08/10/2020   Overweight (BMI 25.0-29.9) 06/01/2020   Pain in left knee 05/27/2018   Thyroid nodule 03/13/2018   Chronic diarrhea 12/26/2017   S/P shoulder replacement, right 09/15/2016   Benign paroxysmal positional vertigo 10/14/2014   Back pain 08/27/2014   Decreased hearing of both ears 05/19/2014   S/P cholecystectomy 10/02/2013   Asthma, moderate persistent 05/16/2013   H/O recurrent pneumonia 05/16/2013   Insomnia 05/05/2013   Gluten intolerance 05/06/2012   Renal calculus, left  01/29/2012   Painful bladder spasm 12/14/2011   Lactose intolerance 12/14/2011   Post-menopausal 12/14/2011   Microscopic colitis 11/08/2011   Hepatic steatosis 08/25/2011   Pulmonary nodule, right 08/25/2011   Hematuria 08/23/2011   Memory loss 12/12/2010   Diabetes mellitus type II, controlled, with no complications ([place]) 72/02/4708   Hyperlipidemia 07/14/2010   Depression with anxiety 07/14/2010   GLAUCOMA 07/14/2010   Allergic rhinitis 07/14/2010   Past Medical History:  Diagnosis Date   Allergic rhinitis    Anxiety    Asthma    Dysrhythmia    palpitations   Glaucoma    History of kidney stones    Hyperlipemia    Migraines    Palpitations    Pneumonia    hx   Urinary tract infection     Family History  Problem Relation Age of Onset   Diabetes Father    Colon cancer Neg Hx    Esophageal cancer Neg Hx    Rectal cancer Neg Hx    Stomach cancer Neg Hx    Breast cancer Neg Hx     Past Surgical History:  Procedure Laterality Date   ABDOMINAL HYSTERECTOMY     Has ovaries   BACK SURGERY  1987   lumb   BREAST SURGERY Left 07/2016   bx- needle in breast   CARPAL TUNNEL RELEASE Right 04/05/2022   Procedure: RIGHT CARPAL TUNNEL RELEASE;  Surgeon: Leandrew Koyanagi,  MD;  Location: [place];  Service: Orthopedics;  Laterality: Right;   CHOLECYSTECTOMY  09/26/13   CLOSED REDUCTION NASAL FRACTURE  04/29/2012   Procedure: CLOSED REDUCTION NASAL FRACTURE;  Surgeon: Jodi Marble, MD;  Location: [place];  Service: ENT;  Laterality: N/A;  WITH STABILIZATION   COLONOSCOPY     Deviated septum repair     EYE SURGERY Bilateral    cataracts   FOOT SURGERY Bilateral    ingrown toe nails   LITHOTRIPSY     REVERSE SHOULDER ARTHROPLASTY Right 09/15/2016   Procedure: RIGHT REVERSE SHOULDER ARTHROPLASTY;  Surgeon: Netta Cedars, MD;  Location: [place];  Service: Orthopedics;  Laterality: Right;   REVERSE SHOULDER ARTHROPLASTY Left 10/15/2020   Procedure:  REVERSE SHOULDER ARTHROPLASTY;  Surgeon: Netta Cedars, MD;  Location: [facility];  Service: Orthopedics;  Laterality: Left;   ROTATOR CUFF REPAIR     right   TONSILLECTOMY     Social History   Occupational History   Occupation: Retired -Sports coach: RETIRED  Tobacco Use   Smoking status: Former    Packs/day: 0.10    Years: 4.00    Total pack years: 0.40    Types: Cigarettes    Quit date: 06/19/1986    Years since quitting: 35.9   Smokeless tobacco: Never   Tobacco comments:    smoked in college years ago  Vaping Use   Vaping Use: Never used  Substance and Sexual Activity   Alcohol use: Yes    Comment: 1-2 glasses week variety beer occ scotch   Drug use: No   Sexual activity: Not Currently    Birth control/protection: Surgical

## 2022-05-09 NOTE — Telephone Encounter (Signed)
Forms are in onbase waiting for print

## 2022-05-09 NOTE — Telephone Encounter (Signed)
Forms placed in Dr Tabori to be signed folder  

## 2022-05-10 ENCOUNTER — Ambulatory Visit: Payer: Medicare Other | Admitting: Physician Assistant

## 2022-05-10 NOTE — Telephone Encounter (Signed)
Forms faxed and placed in scan  

## 2022-05-10 NOTE — Telephone Encounter (Signed)
Forms completed and returned to [facility] 

## 2022-05-16 ENCOUNTER — Encounter: Payer: Medicare Other | Admitting: Orthopaedic Surgery

## 2022-05-17 ENCOUNTER — Encounter: Payer: Medicare Other | Admitting: Orthopaedic Surgery

## 2022-05-18 ENCOUNTER — Ambulatory Visit: Payer: Medicare Other | Admitting: Physician Assistant

## 2022-05-29 ENCOUNTER — Telehealth: Payer: Self-pay | Admitting: Pharmacist

## 2022-05-29 NOTE — Progress Notes (Signed)
Chronic Care Management Pharmacy Assistant   Name: [name]  MRN: [id number] DOB: 1940-07-05   Reason for Encounter: Disease State - General Adherence Call      Medications: Outpatient Encounter Medications as of 05/29/2022  Medication Sig   albuterol (VENTOLIN HFA) 108 (90 Base) MCG/ACT inhaler Inhale into the lungs every 6 (six) hours as needed for wheezing or shortness of breath.   atorvastatin (LIPITOR) 20 MG tablet TAKE ONE (1) TABLET BY MOUTH EVERY DAY   Biotin 5000 MCG CAPS Take 5,000 mcg by mouth daily.   budesonide (ENTOCORT EC) 3 MG 24 hr capsule TAKE TWO CAPSULES BY MOUTH EACH DAY   CALCIUM PO Take 1 capsule by mouth daily.   citalopram (CELEXA) 40 MG tablet TAKE ONE (1) TABLET BY MOUTH EVERY DAY   fenofibrate (TRICOR) 145 MG tablet TAKE ONE TABLET BY MOUTH EVERY MORNING AFTER BREAKFAST   HYDROcodone-acetaminophen (NORCO) 5-325 MG tablet Take 1 tablet by mouth every 6 (six) hours as needed.   ibuprofen (ADVIL) 200 MG tablet Take 200 mg by mouth every 6 (six) hours as needed.   LUMIGAN 0.01 % SOLN Place 1 drop into both eyes at bedtime.    Melatonin 10 MG TABS Take by mouth.   montelukast (SINGULAIR) 10 MG tablet TAKE 1 TABLET BY MOUTH AT BEDTIME   Multiple Vitamins-Minerals (CENTRUM SILVER PO) Take 1 tablet by mouth daily.   mupirocin ointment (BACTROBAN) 2 % Apply to incision twice daily until completely healed   No facility-administered encounter medications on file as of 05/29/2022.    [place] for General Review Call   Chart Review:  Have there been any documented new, changed, or discontinued medications since last visit?  (If yes, include name, dose, frequency, date) Has there been any documented recent hospitalizations or ED visits since last visit with Clinical Pharmacist?  Brief Summary (including medication and/or Diagnosis changes):   Adherence Review:  Does the Clinical Pharmacist Assistant have access to adherence rates?   Adherence rates for STAR metric medications (List medication(s)/day supply/ last 2 fill dates). Adherence rates for medications indicated for disease state being reviewed (List medication(s)/day supply/ last 2 fill dates). Does the patient have >5 day gap between last estimated fill dates for any of the above medications or other medication gaps?  Reason for medication gaps. Do you receive your medications through PAP?   If Yes, what is the status? Last received?   Disease State Questions:  Able to connect with Patient?  Did patient have any problems with their health recently?  Note problems and Concerns: Have you had any admissions or emergency room visits or worsening of your condition(s) since last visit?  Details of ED visit, hospital visit and/or worsening condition(s): Have you had any visits with new specialists or providers since your last visit?  Explain: Have you had any new health care problem(s) since your last visit?  New problem(s) reported: Have you run out of any of your medications since you last spoke with clinical pharmacist?  What caused you to run out of your medications? Are there any medications you are not taking as prescribed?  What kept you from taking your medications as prescribed? Are you having any issues or side effects with your medications?  Note of issues or side effects: Do you have any other health concerns or questions you want to discuss with your Clinical Pharmacist before your next visit?  Note additional concerns and questions from Patient. Are there  any health concerns that you feel we can do a better job addressing?  Note Patient's response. Are you having any problems with any of the following since the last visit: (select all that apply)    Details: 12. Any falls since last visit?   Details: 13. Any increased or uncontrolled pain since last visit?   Details: 14. Next visit Type:        Visit with:        Date:        Time:  33.  Additional Details?     Care Gaps  AWV: unknown Colonoscopy: done 10/16/11 DM Eye Exam: over due 04/19/22 DM Foot Exam: due 07/05/22 Microalbumin: done 07/05/21 HbgAIC: done 01/11/22 (6.3) DEXA: done 07/19/20 Mammogram: done 07/21/21   Star Rating Drugs: Atorvastatin (LIPITOR) 20 MG tablet  - last filled 03/14/22 90 days    Future Appointments  Date Time Provider [place]  07/05/2022 10:15 AM Leandrew Koyanagi, MD [facility] None     Jobe Gibbon, [facility] Clinical Pharmacist Assistant  [phone]

## 2022-05-31 ENCOUNTER — Encounter: Payer: Medicare Other | Admitting: Orthopaedic Surgery

## 2022-06-20 DIAGNOSIS — J45909 Unspecified asthma, uncomplicated: Secondary | ICD-10-CM | POA: Diagnosis not present

## 2022-06-20 DIAGNOSIS — R0789 Other chest pain: Secondary | ICD-10-CM | POA: Diagnosis not present

## 2022-06-20 DIAGNOSIS — I959 Hypotension, unspecified: Secondary | ICD-10-CM | POA: Diagnosis not present

## 2022-06-20 DIAGNOSIS — R079 Chest pain, unspecified: Secondary | ICD-10-CM | POA: Diagnosis not present

## 2022-06-20 DIAGNOSIS — J984 Other disorders of lung: Secondary | ICD-10-CM | POA: Diagnosis not present

## 2022-06-20 DIAGNOSIS — R7989 Other specified abnormal findings of blood chemistry: Secondary | ICD-10-CM | POA: Diagnosis not present

## 2022-06-22 ENCOUNTER — Other Ambulatory Visit: Payer: Self-pay

## 2022-06-22 ENCOUNTER — Encounter (HOSPITAL_BASED_OUTPATIENT_CLINIC_OR_DEPARTMENT_OTHER): Payer: Self-pay | Admitting: Orthopaedic Surgery

## 2022-06-23 ENCOUNTER — Encounter: Payer: Medicare Other | Admitting: Orthopaedic Surgery

## 2022-06-28 ENCOUNTER — Ambulatory Visit (HOSPITAL_BASED_OUTPATIENT_CLINIC_OR_DEPARTMENT_OTHER)
Admission: RE | Admit: 2022-06-28 | Discharge: 2022-06-28 | Disposition: A | Discharge status: Home / Self Care | Payer: Medicare Other | Source: Ambulatory Visit | Attending: Orthopaedic Surgery | Admitting: Orthopaedic Surgery

## 2022-06-28 ENCOUNTER — Encounter (HOSPITAL_BASED_OUTPATIENT_CLINIC_OR_DEPARTMENT_OTHER): Payer: Self-pay | Admitting: Orthopaedic Surgery

## 2022-06-28 ENCOUNTER — Ambulatory Visit (HOSPITAL_BASED_OUTPATIENT_CLINIC_OR_DEPARTMENT_OTHER): Payer: Medicare Other | Admitting: Anesthesiology

## 2022-06-28 ENCOUNTER — Encounter (HOSPITAL_BASED_OUTPATIENT_CLINIC_OR_DEPARTMENT_OTHER)
Admission: RE | Disposition: A | Discharge status: Home / Self Care | Payer: Self-pay | Source: Ambulatory Visit | Attending: Orthopaedic Surgery

## 2022-06-28 ENCOUNTER — Other Ambulatory Visit: Payer: Self-pay

## 2022-06-28 DIAGNOSIS — F419 Anxiety disorder, unspecified: Secondary | ICD-10-CM | POA: Insufficient documentation

## 2022-06-28 DIAGNOSIS — Z01818 Encounter for other preprocedural examination: Secondary | ICD-10-CM

## 2022-06-28 DIAGNOSIS — F32A Depression, unspecified: Secondary | ICD-10-CM | POA: Diagnosis not present

## 2022-06-28 DIAGNOSIS — G709 Myoneural disorder, unspecified: Secondary | ICD-10-CM | POA: Diagnosis not present

## 2022-06-28 DIAGNOSIS — F418 Other specified anxiety disorders: Secondary | ICD-10-CM

## 2022-06-28 DIAGNOSIS — J45909 Unspecified asthma, uncomplicated: Secondary | ICD-10-CM

## 2022-06-28 DIAGNOSIS — M1990 Unspecified osteoarthritis, unspecified site: Secondary | ICD-10-CM | POA: Diagnosis not present

## 2022-06-28 DIAGNOSIS — G5602 Carpal tunnel syndrome, left upper limb: Secondary | ICD-10-CM | POA: Diagnosis not present

## 2022-06-28 DIAGNOSIS — Z87891 Personal history of nicotine dependence: Secondary | ICD-10-CM

## 2022-06-28 HISTORY — PX: CARPAL TUNNEL RELEASE: SHX101

## 2022-06-28 SURGERY — CARPAL TUNNEL RELEASE
Anesthesia: Monitor Anesthesia Care | Site: Hand | Laterality: Left

## 2022-06-28 MED ORDER — PROPOFOL 10 MG/ML IV BOLUS
INTRAVENOUS | Status: DC | PRN
  Administered 2022-06-28 (×2): 20 mg via INTRAVENOUS

## 2022-06-28 MED ORDER — FENTANYL CITRATE (PF) 100 MCG/2ML IJ SOLN
INTRAMUSCULAR | Status: DC | PRN
  Administered 2022-06-28 (×2): 25 ug via INTRAVENOUS

## 2022-06-28 MED ORDER — PROPOFOL 500 MG/50ML IV EMUL
INTRAVENOUS | Status: AC
  Filled 2022-06-28: qty 50

## 2022-06-28 MED ORDER — 0.9 % SODIUM CHLORIDE (POUR BTL) OPTIME
TOPICAL | Status: DC | PRN
  Administered 2022-06-28: 500 mL

## 2022-06-28 MED ORDER — ONDANSETRON HCL 4 MG/2ML IJ SOLN
INTRAMUSCULAR | Status: DC | PRN
  Administered 2022-06-28: 4 mg via INTRAVENOUS

## 2022-06-28 MED ORDER — PROPOFOL 500 MG/50ML IV EMUL
INTRAVENOUS | Status: DC | PRN
  Administered 2022-06-28: 75 ug/kg/min via INTRAVENOUS

## 2022-06-28 MED ORDER — LACTATED RINGERS IV SOLN: INTRAVENOUS | Status: DC

## 2022-06-28 MED ORDER — HYDROCODONE-ACETAMINOPHEN 5-325 MG PO TABS: 1.0000 | ORAL_TABLET | Freq: Four times a day (QID) | ORAL | 0 refills | Status: DC | PRN

## 2022-06-28 MED ORDER — BUPIVACAINE HCL (PF) 0.25 % IJ SOLN
INTRAMUSCULAR | Status: DC | PRN
  Administered 2022-06-28: 6 mL

## 2022-06-28 MED ORDER — CEFAZOLIN SODIUM-DEXTROSE 2-4 GM/100ML-% IV SOLN
2.0000 g | INTRAVENOUS | Status: AC
  Administered 2022-06-28: 2 g via INTRAVENOUS

## 2022-06-28 MED ORDER — PROPOFOL 10 MG/ML IV BOLUS
INTRAVENOUS | Status: AC
  Filled 2022-06-28: qty 20

## 2022-06-28 MED ORDER — MIDAZOLAM HCL 2 MG/2ML IJ SOLN
INTRAMUSCULAR | Status: AC
  Filled 2022-06-28: qty 2

## 2022-06-28 MED ORDER — BUPIVACAINE HCL (PF) 0.25 % IJ SOLN
INTRAMUSCULAR | Status: AC
  Filled 2022-06-28: qty 30

## 2022-06-28 MED ORDER — CEFAZOLIN SODIUM-DEXTROSE 2-4 GM/100ML-% IV SOLN
INTRAVENOUS | Status: AC
  Filled 2022-06-28: qty 100

## 2022-06-28 MED ORDER — FENTANYL CITRATE (PF) 100 MCG/2ML IJ SOLN
INTRAMUSCULAR | Status: AC
  Filled 2022-06-28: qty 2

## 2022-06-28 SURGICAL SUPPLY — 47 items
BAND INSRT 18 STRL LF DISP RB (MISCELLANEOUS) ×2
BAND RUBBER #18 3X1/16 STRL (MISCELLANEOUS) ×2 IMPLANT
BLADE MINI RND TIP GREEN BEAV (BLADE) ×1 IMPLANT
BLADE SURG 15 STRL LF DISP TIS (BLADE) ×1 IMPLANT
BLADE SURG 15 STRL SS (BLADE) ×1
BNDG CMPR 9X4 STRL LF SNTH (GAUZE/BANDAGES/DRESSINGS) ×1
BNDG ELASTIC 3X5.8 VLCR STR LF (GAUZE/BANDAGES/DRESSINGS) ×1 IMPLANT
BNDG ESMARK 4X9 LF (GAUZE/BANDAGES/DRESSINGS) ×1 IMPLANT
BNDG PLASTER X FAST 3X3 WHT LF (CAST SUPPLIES) IMPLANT
BNDG PLSTR 9X3 FST ST WHT (CAST SUPPLIES)
BRUSH SCRUB EZ PLAIN DRY (MISCELLANEOUS) ×1 IMPLANT
CANISTER SUCT 1200ML W/VALVE (MISCELLANEOUS) ×1 IMPLANT
CORD BIPOLAR FORCEPS 12FT (ELECTRODE) ×1 IMPLANT
COVER BACK TABLE 60X90IN (DRAPES) ×1 IMPLANT
COVER MAYO STAND STRL (DRAPES) ×1 IMPLANT
CUFF TOURN SGL QUICK 18X4 (TOURNIQUET CUFF) IMPLANT
DRAPE EXTREMITY T 121X128X90 (DISPOSABLE) ×1 IMPLANT
DRAPE IMP U-DRAPE 54X76 (DRAPES) ×1 IMPLANT
DRAPE SURG 17X23 STRL (DRAPES) ×1 IMPLANT
GAUZE 4X4 16PLY ~~LOC~~+RFID DBL (SPONGE) IMPLANT
GAUZE SPONGE 4X4 12PLY STRL (GAUZE/BANDAGES/DRESSINGS) ×1 IMPLANT
GAUZE XEROFORM 1X8 LF (GAUZE/BANDAGES/DRESSINGS) ×1 IMPLANT
GLOVE BIOGEL PI IND STRL 7.5 (GLOVE) ×1 IMPLANT
GLOVE ECLIPSE 7.0 STRL STRAW (GLOVE) ×1 IMPLANT
GLOVE INDICATOR 7.0 STRL GRN (GLOVE) ×1 IMPLANT
GLOVE SURG SYN 7.5  E (GLOVE) ×1
GLOVE SURG SYN 7.5 E (GLOVE) ×1 IMPLANT
GLOVE SURG SYN 7.5 PF PI (GLOVE) ×1 IMPLANT
GOWN STRL REUS W/ TWL LRG LVL3 (GOWN DISPOSABLE) ×1 IMPLANT
GOWN STRL REUS W/TWL LRG LVL3 (GOWN DISPOSABLE) ×1
GOWN STRL SURGICAL XL XLNG (GOWN DISPOSABLE) ×2 IMPLANT
NDL HYPO 25X1 1.5 SAFETY (NEEDLE) IMPLANT
NEEDLE HYPO 25X1 1.5 SAFETY (NEEDLE) IMPLANT
NS IRRIG 1000ML POUR BTL (IV SOLUTION) ×1 IMPLANT
PACK BASIN DAY SURGERY FS (CUSTOM PROCEDURE TRAY) ×1 IMPLANT
PAD CAST 3X4 CTTN HI CHSV (CAST SUPPLIES) ×1 IMPLANT
PADDING CAST COTTON 3X4 STRL (CAST SUPPLIES) ×1
SHEET MEDIUM DRAPE 40X70 STRL (DRAPES) ×1 IMPLANT
SPIKE FLUID TRANSFER (MISCELLANEOUS) IMPLANT
STOCKINETTE 4X48 STRL (DRAPES) ×1 IMPLANT
SUT ETHILON 3 0 PS 1 (SUTURE) ×1 IMPLANT
SYR BULB EAR ULCER 3OZ GRN STR (SYRINGE) ×1 IMPLANT
SYR CONTROL 10ML LL (SYRINGE) IMPLANT
TOWEL GREEN STERILE FF (TOWEL DISPOSABLE) ×1 IMPLANT
TRAY DSU PREP LF (CUSTOM PROCEDURE TRAY) ×1 IMPLANT
TUBE CONNECTING 20X1/4 (TUBING) IMPLANT
UNDERPAD 30X36 HEAVY ABSORB (UNDERPADS AND DIAPERS) ×1 IMPLANT

## 2022-06-28 NOTE — Anesthesia Preprocedure Evaluation (Addendum)
Anesthesia Evaluation  Patient identified by MRN, date of birth, ID band Patient awake    Reviewed: Allergy & Precautions, NPO status , Patient's Chart, lab work & pertinent test results  History of Anesthesia Complications Negative for: history of anesthetic complications  Airway Mallampati: II  TM Distance: >3 FB Neck ROM: Full    Dental  (+) Dental Advisory Given, Caps   Pulmonary asthma , former smoker   Pulmonary exam normal        Cardiovascular negative cardio ROS Normal cardiovascular exam     Neuro/Psych  Headaches PSYCHIATRIC DISORDERS Anxiety Depression     Neuromuscular disease    GI/Hepatic negative GI ROS, Neg liver ROS,,,  Endo/Other  negative endocrine ROS    Renal/GU negative Renal ROS     Musculoskeletal  (+) Arthritis ,    Abdominal   Peds  Hematology negative hematology ROS (+)   Anesthesia Other Findings   Reproductive/Obstetrics                             Anesthesia Physical Anesthesia Plan  ASA: 2  Anesthesia Plan: MAC   Post-op Pain Management: Tylenol PO (pre-op)* and Toradol IV (intra-op)*   Induction: Intravenous  PONV Risk Score and Plan: 2 and Ondansetron, Dexamethasone, Propofol infusion and Treatment may vary due to age or medical condition  Airway Management Planned: Natural Airway  Additional Equipment:   Intra-op Plan:   Post-operative Plan:   Informed Consent: I have reviewed the patients History and Physical, chart, labs and discussed the procedure including the risks, benefits and alternatives for the proposed anesthesia with the patient or authorized representative who has indicated his/her understanding and acceptance.     Dental advisory given  Plan Discussed with: Anesthesiologist and CRNA  Anesthesia Plan Comments:         Anesthesia Quick Evaluation

## 2022-06-28 NOTE — H&P (Signed)
PREOPERATIVE H&P  Chief Complaint: left carpal tunnel syndrome  HPI: [name] is a 82 y.o. female who presents for surgical treatment of left carpal tunnel syndrome.  She denies any changes in medical history.  Past Medical History:  Diagnosis Date   Allergic rhinitis    Anxiety    Asthma    Dysrhythmia    palpitations   Glaucoma    History of kidney stones    Hyperlipemia    Migraines    Palpitations    Pneumonia    hx   Urinary tract infection    Past Surgical History:  Procedure Laterality Date   ABDOMINAL HYSTERECTOMY     Has ovaries   BACK SURGERY  1987   lumb   BREAST SURGERY Left 07/2016   bx- needle in breast   CARPAL TUNNEL RELEASE Right 04/05/2022   Procedure: RIGHT CARPAL TUNNEL RELEASE;  Surgeon: Leandrew Koyanagi, MD;  Location: [facility];  Service: Orthopedics;  Laterality: Right;   CHOLECYSTECTOMY  09/26/13   CLOSED REDUCTION NASAL FRACTURE  04/29/2012   Procedure: CLOSED REDUCTION NASAL FRACTURE;  Surgeon: Jodi Marble, MD;  Location: [facility];  Service: ENT;  Laterality: N/A;  WITH STABILIZATION   COLONOSCOPY     Deviated septum repair     EYE SURGERY Bilateral    cataracts   FOOT SURGERY Bilateral    ingrown toe nails   LITHOTRIPSY     REVERSE SHOULDER ARTHROPLASTY Right 09/15/2016   Procedure: RIGHT REVERSE SHOULDER ARTHROPLASTY;  Surgeon: Netta Cedars, MD;  Location: [facility];  Service: Orthopedics;  Laterality: Right;   REVERSE SHOULDER ARTHROPLASTY Left 10/15/2020   Procedure: REVERSE SHOULDER ARTHROPLASTY;  Surgeon: Netta Cedars, MD;  Location: [facility];  Service: Orthopedics;  Laterality: Left;   ROTATOR CUFF REPAIR     right   TONSILLECTOMY     Social History   Socioeconomic History   Marital status: Widowed    Spouse name: Not on file   Number of children: 2   Years of education: Not on file   Highest education level: Not on file  Occupational History   Occupation: Retired -Water engineer: RETIRED  Tobacco Use   Smoking status: Former    Packs/day: 0.10    Years: 4.00    Total pack years: 0.40    Types: Cigarettes    Quit date: 06/19/1986    Years since quitting: 36.0   Smokeless tobacco: Never   Tobacco comments:    smoked in college years ago  Vaping Use   Vaping Use: Never used  Substance and Sexual Activity   Alcohol use: Yes    Comment: 1-2 glasses week variety beer occ scotch   Drug use: No   Sexual activity: Not Currently    Birth control/protection: Surgical  Other Topics Concern   Not on file  Social History Narrative   Daily caffeine    Social Determinants of Health   Financial Resource Strain: Low Risk  (11/03/2021)   Overall Financial Resource Strain (CARDIA)    Difficulty of Paying Living Expenses: Not hard at all  Food Insecurity: No Food Insecurity (11/03/2021)   Hunger Vital Sign    Worried About Running Out of Food in the Last Year: Never true    [place] in the Last Year: Never true  Transportation Needs: No Transportation Needs (11/03/2021)   PRAPARE - Hydrologist (Medical): No  Lack of Transportation (Non-Medical): No  Physical Activity: Sufficiently Active (11/03/2021)   Exercise Vital Sign    Days of Exercise per Week: 3 days    Minutes of Exercise per Session: 50 min  Stress: No Stress Concern Present (11/03/2021)   [place]    Feeling of Stress : Not at all  Social Connections: Moderately Integrated (11/03/2021)   Social Connection and Isolation Panel [NHANES]    Frequency of Communication with Friends and Family: More than three times a week    Frequency of Social Gatherings with Friends and Family: More than three times a week    Attends Religious Services: More than 4 times per year    Active Member of [employer] or Organizations: Yes    Attends Archivist Meetings: 1 to 4 times per year    Marital Status:  Widowed   Family History  Problem Relation Age of Onset   Diabetes Father    Colon cancer Neg Hx    Esophageal cancer Neg Hx    Rectal cancer Neg Hx    Stomach cancer Neg Hx    Breast cancer Neg Hx    Allergies  Allergen Reactions   Papain Anaphylaxis    Takes an extreme concentration   Papaya Derivatives Anaphylaxis   Mushroom Ext Cmplx(Shiitake-Reishi-Mait) Other (See Comments)    Head congestion and sore throat   Mushroom Extract Complex Other (See Comments) and Nausea Only    Head congestion and sore throat after eating 2 days in row   Venlafaxine Other (See Comments) and Rash    UNSPECIFIED REACTION  Pt states she can take Name brand. Lethargy   Prior to Admission medications   Medication Sig Start Date End Date Taking? Authorizing Provider  albuterol (VENTOLIN HFA) 108 (90 Base) MCG/ACT inhaler Inhale into the lungs every 6 (six) hours as needed for wheezing or shortness of breath.   Yes [provider]  atorvastatin (LIPITOR) 20 MG tablet TAKE ONE (1) TABLET BY MOUTH EVERY DAY 12/12/21  Yes Midge Minium, MD  CALCIUM PO Take 1 capsule by mouth daily.   Yes [provider]  citalopram (CELEXA) 40 MG tablet TAKE ONE (1) TABLET BY MOUTH EVERY DAY 02/07/22  Yes Midge Minium, MD  fenofibrate (TRICOR) 145 MG tablet TAKE ONE TABLET BY MOUTH EVERY MORNING AFTER BREAKFAST 05/09/22  Yes Midge Minium, MD  LUMIGAN 0.01 % SOLN Place 1 drop into both eyes at bedtime.  09/07/14  Yes [provider]  Melatonin 10 MG TABS Take by mouth.   Yes [provider]  Multiple Vitamins-Minerals (CENTRUM SILVER PO) Take 1 tablet by mouth daily.   Yes [provider]  mupirocin ointment (BACTROBAN) 2 % Apply to incision twice daily until completely healed 04/21/22  Yes Aundra Dubin, PA-C  Biotin 5000 MCG CAPS Take 5,000 mcg by mouth daily.    [provider]  budesonide (ENTOCORT EC) 3 MG 24 hr capsule TAKE TWO CAPSULES BY MOUTH  EACH DAY 03/15/22   Levin Erp, PA  HYDROcodone-acetaminophen (NORCO) 5-325 MG tablet Take 1 tablet by mouth every 6 (six) hours as needed. 04/05/22   Leandrew Koyanagi, MD  ibuprofen (ADVIL) 200 MG tablet Take 200 mg by mouth every 6 (six) hours as needed.    [provider]  montelukast (SINGULAIR) 10 MG tablet TAKE 1 TABLET BY MOUTH AT BEDTIME 02/07/22   Midge Minium, MD  Positive ROS: All other systems have been reviewed and were otherwise negative with the exception of those mentioned in the HPI and as above.  Physical Exam: General: Alert, no acute distress Cardiovascular: No pedal edema Respiratory: No cyanosis, no use of accessory musculature GI: abdomen soft Skin: No lesions in the area of chief complaint Neurologic: Sensation intact distally Psychiatric: Patient is competent for consent with normal mood and affect Lymphatic: no lymphedema  MUSCULOSKELETAL: exam stable  Assessment: left carpal tunnel syndrome  Plan: Plan for Procedure(s): LEFT CARPAL TUNNEL RELEASE  The risks benefits and alternatives were discussed with the patient including but not limited to the risks of nonoperative treatment, versus surgical intervention including infection, bleeding, nerve injury,  blood clots, cardiopulmonary complications, morbidity, mortality, among others, and they were willing to proceed.   Eduard Roux, MD 06/28/2022 12:03 PM

## 2022-06-28 NOTE — Anesthesia Procedure Notes (Signed)
Date/Time: 06/28/2022 12:19 PM  Performed by: Glory Buff, CRNAOxygen Delivery Method: Simple face mask

## 2022-06-28 NOTE — Discharge Instructions (Addendum)
   Postoperative instructions:  Weightbearing instructions: no lifting lifting  Dressing instructions: Keep your dressing and/or splint clean and dry at all times.  It will be removed at your first post-operative appointment.  Your stitches and/or staples will be removed at this visit.  Incision instructions:  Do not soak your incision for 3 weeks after surgery.  If the incision gets wet, pat dry and do not scrub the incision.  Pain control:  You have been given a prescription to be taken as directed for post-operative pain control.  In addition, elevate the operative extremity above the heart at all times to prevent swelling and throbbing pain.  Take over-the-counter Colace, '100mg'$  by mouth twice a day while taking narcotic pain medications to help prevent constipation.  Follow up appointments: 1) 10 days for suture removal and wound check. 2) Dr. Erlinda Hong as scheduled.   -------------------------------------------------------------------------------------------------------------  After Surgery Pain Control:  After your surgery, post-surgical discomfort or pain is likely. This discomfort can last several days to a few weeks. At certain times of the day your discomfort may be more intense.  Did you receive a nerve block?  A nerve block can provide pain relief for one hour to two days after your surgery. As long as the nerve block is working, you will experience little or no sensation in the area the surgeon operated on.  As the nerve block wears off, you will begin to experience pain or discomfort. It is very important that you begin taking your prescribed pain medication before the nerve block fully wears off. Treating your pain at the first sign of the block wearing off will ensure your pain is better controlled and more tolerable when full-sensation returns. Do not wait until the pain is intolerable, as the medicine will be less effective. It is better to treat pain in advance than to try and  catch up.  General Anesthesia:  If you did not receive a nerve block during your surgery, you will need to start taking your pain medication shortly after your surgery and should continue to do so as prescribed by your surgeon.  Pain Medication:  Most commonly we prescribe Vicodin and Percocet for post-operative pain. Both of these medications contain a combination of acetaminophen (Tylenol) and a narcotic to help control pain.   It takes between 30 and 45 minutes before pain medication starts to work. It is important to take your medication before your pain level gets too intense.   Nausea is a common side effect of many pain medications. You will want to eat something before taking your pain medicine to help prevent nausea.   If you are taking a prescription pain medication that contains acetaminophen, we recommend that you do not take additional over the counter acetaminophen (Tylenol).  Other pain relieving options:   Using a cold pack to ice the affected area a few times a day (15 to 20 minutes at a time) can help to relieve pain, reduce swelling and bruising.   Elevation of the affected area can also help to reduce pain and swelling.

## 2022-06-28 NOTE — Op Note (Signed)
   Carpal tunnel op note  DATE OF SURGERY:06/28/2022  PREOPERATIVE DIAGNOSIS:  Left carpal tunnel syndrome  POSTOPERATIVE DIAGNOSIS: same  PROCEDURE: Left carpal tunnel release. CPT 14239  SURGEON: Marianna Payment, M.D.  ASSIST: Madalyn Rob, [place]  ANESTHESIA:  Local and MAC  TOURNIQUET TIME: less than 20 minutes  BLOOD LOSS: Minimal.  COMPLICATIONS: None.  PATHOLOGY: None.  INDICATIONS: The patient is a 82 y.o. -year-old female who presented with carpal tunnel syndrome failing nonsurgical management, indicated for surgical release.  DESCRIPTION OF PROCEDURE: The patient was identified in the preoperative holding area.  The operative site was marked by the surgeon and confirmed by the patient.  The patient was brought back to the operating room.  MAC anesthesia was administered.  Local anesthetic with epi was injected into the operative site.  A well padded nonsterile tourniquet was placed. The operative extremity was prepped and draped in standard sterile fashion.  A timeout was performed.  Preoperative antibiotics were given.   A palmar incision was made about 5 mm ulnar to the thenar crease.  The palmar aponeurosis was exposed and divided in line with the skin incision. The palmaris brevis was visualized and divided.  The distal edge of the transcarpal ligament was identified. A hemostat was inserted into the carpal tunnel to protect the median nerve and the flexor tendons. Then, the transverse carpal ligament was released under direct visualization. Proximally, a subcutaneous tunnel was made allowing a Sewell retractor to be placed. Then, the distal portion of the antebrachial fascia was released. Distally, all fibrous bands were released. The median nerve was visualized, and the fat pad was exposed. Wound was irrigated and closed with 4-0 nylon sutures. Sterile dressing applied. The patient was transferred to the recovery room in stable condition after all counts were  correct.  POSTOPERATIVE PLAN: To start nerve gliding exercises as tolerated and no heavy lifting for four weeks.  Eduard Roux, M.D. [facility] 12:38 PM

## 2022-06-28 NOTE — Anesthesia Postprocedure Evaluation (Signed)
Anesthesia Post Note  Patient: [name]  Procedure(s) Performed: LEFT CARPAL TUNNEL RELEASE (Left: Hand)     Patient location during evaluation: PACU Anesthesia Type: MAC Level of consciousness: awake and alert Pain management: pain level controlled Vital Signs Assessment: post-procedure vital signs reviewed and stable Respiratory status: spontaneous breathing and respiratory function stable Cardiovascular status: stable Postop Assessment: no apparent nausea or vomiting Anesthetic complications: no   No notable events documented.  Last Vitals:  Vitals:   06/28/22 1300 06/28/22 1331  BP: 130/63 (!) 140/64  Pulse: 71 66  Resp: 18 20  Temp:  36.8 C  SpO2: 96% 94%    Last Pain:  Vitals:   06/28/22 1331  TempSrc: Tympanic  PainSc: 0-No pain                 [name]

## 2022-06-28 NOTE — Transfer of Care (Signed)
Immediate Anesthesia Transfer of Care Note  Patient: [name]  Procedure(s) Performed: LEFT CARPAL TUNNEL RELEASE (Left: Hand)  Patient Location: PACU  Anesthesia Type:MAC  Level of Consciousness: drowsy, patient cooperative, and responds to stimulation  Airway & Oxygen Therapy: Patient Spontanous Breathing and Patient connected to face mask oxygen  Post-op Assessment: Report given to RN and Post -op Vital signs reviewed and stable  Post vital signs: Reviewed and stable  Last Vitals:  Vitals Value Taken Time  BP    Temp    Pulse 73 06/28/22 1249  Resp 19 06/28/22 1249  SpO2 99 % 06/28/22 1249  Vitals shown include unvalidated device data.  Last Pain:  Vitals:   06/28/22 1034  TempSrc: Oral  PainSc: 0-No pain         Complications: No notable events documented.

## 2022-06-29 ENCOUNTER — Encounter (HOSPITAL_BASED_OUTPATIENT_CLINIC_OR_DEPARTMENT_OTHER): Payer: Self-pay | Admitting: Orthopaedic Surgery

## 2022-07-05 ENCOUNTER — Encounter: Payer: Self-pay | Admitting: Orthopaedic Surgery

## 2022-07-05 ENCOUNTER — Ambulatory Visit (INDEPENDENT_AMBULATORY_CARE_PROVIDER_SITE_OTHER): Payer: Medicare Other | Admitting: Physician Assistant

## 2022-07-05 DIAGNOSIS — G5602 Carpal tunnel syndrome, left upper limb: Secondary | ICD-10-CM

## 2022-07-05 DIAGNOSIS — Z9889 Other specified postprocedural states: Secondary | ICD-10-CM

## 2022-07-05 NOTE — Progress Notes (Signed)
Post-Op Visit Note   Patient: [name]           Date of Birth: April 30, 1941           MRN: [id number] Visit Date: 07/05/2022 PCP: Midge Minium, MD   Assessment & Plan:  Chief Complaint:  Chief Complaint  Patient presents with   Left Wrist - Follow-up    Left carpal tunnel release 06/28/22   Visit Diagnoses:  1. Left carpal tunnel syndrome   2. S/P carpal tunnel release     Plan: Patient is a pleasant 82 year old female who comes in today 1 week status post left carpal tunnel release 06/28/2022.  She has been doing well.  No pain or paresthesias.  Examination of her left wrist reveals a well-healing surgical incision with nylon sutures in place.  No evidence of infection or cellulitis.  Fingers warm well-perfused.  Of note, when I walked in her room she was cleaning her incision with alcohol and 4 x 4's.  Today, her wound was recleaned with Betadine.  Bandage applied.  We have discussed placing her in a removable Velcro splint but she tells me she gave hers away and does not want to purchase a new one.  We have wrapped her wrist in an Ace bandage.  She is to avoid any heavier lifting or submerging her hand underwater for 3 more weeks.  She will follow-up next week for suture removal.  Call with concerns or questions.  Follow-Up Instructions: Return in about 1 week (around 07/12/2022).   Orders:  No orders of the defined types were placed in this encounter.  No orders of the defined types were placed in this encounter.   Imaging: No new imaging  PMFS History: Patient Active Problem List   Diagnosis Date Noted   Right carpal tunnel syndrome 04/04/2022   Left carpal tunnel syndrome 04/04/2022   Pain of left hip joint 06/03/2021   Gastro-esophageal reflux disease without esophagitis 10/15/2020   Disorders of muscle in diseases classified elsewhere, multiple sites 10/15/2020   Other muscle spasm 10/15/2020   Post-traumatic osteoarthritis 08/10/2020   Overweight (BMI  25.0-29.9) 06/01/2020   Pain in left knee 05/27/2018   Thyroid nodule 03/13/2018   Chronic diarrhea 12/26/2017   S/P shoulder replacement, right 09/15/2016   Benign paroxysmal positional vertigo 10/14/2014   Back pain 08/27/2014   Decreased hearing of both ears 05/19/2014   S/P cholecystectomy 10/02/2013   Asthma, moderate persistent 05/16/2013   H/O recurrent pneumonia 05/16/2013   Insomnia 05/05/2013   Gluten intolerance 05/06/2012   Renal calculus, left 01/29/2012   Painful bladder spasm 12/14/2011   Lactose intolerance 12/14/2011   Post-menopausal 12/14/2011   Microscopic colitis 11/08/2011   Hepatic steatosis 08/25/2011   Pulmonary nodule, right 08/25/2011   Hematuria 08/23/2011   Memory loss 12/12/2010   Diabetes mellitus type II, controlled, with no complications ([place]) 50/35/4656   Hyperlipidemia 07/14/2010   Depression with anxiety 07/14/2010   GLAUCOMA 07/14/2010   Allergic rhinitis 07/14/2010   Past Medical History:  Diagnosis Date   Allergic rhinitis    Anxiety    Asthma    Dysrhythmia    palpitations   Glaucoma    History of kidney stones    Hyperlipemia    Migraines    Palpitations    Pneumonia    hx   Urinary tract infection     Family History  Problem Relation Age of Onset   Diabetes Father    Colon cancer Neg  Hx    Esophageal cancer Neg Hx    Rectal cancer Neg Hx    Stomach cancer Neg Hx    Breast cancer Neg Hx     Past Surgical History:  Procedure Laterality Date   ABDOMINAL HYSTERECTOMY     Has ovaries   BACK SURGERY  1987   lumb   BREAST SURGERY Left 07/2016   bx- needle in breast   CARPAL TUNNEL RELEASE Right 04/05/2022   Procedure: RIGHT CARPAL TUNNEL RELEASE;  Surgeon: Leandrew Koyanagi, MD;  Location: [facility];  Service: Orthopedics;  Laterality: Right;   CARPAL TUNNEL RELEASE Left 06/28/2022   Procedure: LEFT CARPAL TUNNEL RELEASE;  Surgeon: Leandrew Koyanagi, MD;  Location: [facility];  Service:  Orthopedics;  Laterality: Left;   CHOLECYSTECTOMY  09/26/13   CLOSED REDUCTION NASAL FRACTURE  04/29/2012   Procedure: CLOSED REDUCTION NASAL FRACTURE;  Surgeon: Jodi Marble, MD;  Location: [place];  Service: ENT;  Laterality: N/A;  WITH STABILIZATION   COLONOSCOPY     Deviated septum repair     EYE SURGERY Bilateral    cataracts   FOOT SURGERY Bilateral    ingrown toe nails   LITHOTRIPSY     REVERSE SHOULDER ARTHROPLASTY Right 09/15/2016   Procedure: RIGHT REVERSE SHOULDER ARTHROPLASTY;  Surgeon: Netta Cedars, MD;  Location: [place];  Service: Orthopedics;  Laterality: Right;   REVERSE SHOULDER ARTHROPLASTY Left 10/15/2020   Procedure: REVERSE SHOULDER ARTHROPLASTY;  Surgeon: Netta Cedars, MD;  Location: [facility];  Service: Orthopedics;  Laterality: Left;   ROTATOR CUFF REPAIR     right   TONSILLECTOMY     Social History   Occupational History   Occupation: Retired -Sports coach: RETIRED  Tobacco Use   Smoking status: Former    Packs/day: 0.10    Years: 4.00    Total pack years: 0.40    Types: Cigarettes    Quit date: 06/19/1986    Years since quitting: 36.0   Smokeless tobacco: Never   Tobacco comments:    smoked in college years ago  Vaping Use   Vaping Use: Never used  Substance and Sexual Activity   Alcohol use: Yes    Comment: 1-2 glasses week variety beer occ scotch   Drug use: No   Sexual activity: Not Currently    Birth control/protection: Surgical

## 2022-07-12 ENCOUNTER — Other Ambulatory Visit: Payer: Self-pay | Admitting: Family Medicine

## 2022-07-13 ENCOUNTER — Encounter: Payer: Self-pay | Admitting: Physician Assistant

## 2022-07-13 ENCOUNTER — Ambulatory Visit (INDEPENDENT_AMBULATORY_CARE_PROVIDER_SITE_OTHER): Payer: Medicare Other | Admitting: Physician Assistant

## 2022-07-13 DIAGNOSIS — Z9889 Other specified postprocedural states: Secondary | ICD-10-CM

## 2022-07-13 NOTE — Progress Notes (Signed)
Post-Op Visit Note   Patient: [name]           Date of Birth: Nov 24, 1940           MRN: [id number] Visit Date: 07/13/2022 PCP: Midge Minium, MD   Assessment & Plan:  Chief Complaint:  Chief Complaint  Patient presents with   Left Wrist - Follow-up    Left carpal tunnel release 06/28/22   Visit Diagnoses:  1. S/P carpal tunnel release     Plan: Patient is a 82 year old female who comes in today 2 weeks status post left carpal tunnel release 06/28/2022.  She has been doing well.  No pain or paresthesias.  She has not been wearing her wrist splint.  She tells me she has been painting her incision with mercuro-chrome.  Examination of her incision reveals a well-healed surgical incision with nylon sutures in place.  There is a shiny red/orange paint over the entire incision.  Fingers are warm well-perfused.  She is neurovascular intact distally.  Today, her wound was cleaned and sutures were removed.  Steri-Strips applied.  No heavy lifting or submerging her hand underwater for another 2 weeks.  She will start with range of motion exercises.  Follow-up in 2 weeks for recheck.   Follow-Up Instructions: Return in about 2 weeks (around 07/27/2022).   Orders:  No orders of the defined types were placed in this encounter.  No orders of the defined types were placed in this encounter.   Imaging: No new imaging  PMFS History: Patient Active Problem List   Diagnosis Date Noted   Right carpal tunnel syndrome 04/04/2022   Left carpal tunnel syndrome 04/04/2022   Pain of left hip joint 06/03/2021   Gastro-esophageal reflux disease without esophagitis 10/15/2020   Disorders of muscle in diseases classified elsewhere, multiple sites 10/15/2020   Other muscle spasm 10/15/2020   Post-traumatic osteoarthritis 08/10/2020   Overweight (BMI 25.0-29.9) 06/01/2020   Pain in left knee 05/27/2018   Thyroid nodule 03/13/2018   Chronic diarrhea 12/26/2017   S/P shoulder replacement,  right 09/15/2016   Benign paroxysmal positional vertigo 10/14/2014   Back pain 08/27/2014   Decreased hearing of both ears 05/19/2014   S/P cholecystectomy 10/02/2013   Asthma, moderate persistent 05/16/2013   H/O recurrent pneumonia 05/16/2013   Insomnia 05/05/2013   Gluten intolerance 05/06/2012   Renal calculus, left 01/29/2012   Painful bladder spasm 12/14/2011   Lactose intolerance 12/14/2011   Post-menopausal 12/14/2011   Microscopic colitis 11/08/2011   Hepatic steatosis 08/25/2011   Pulmonary nodule, right 08/25/2011   Hematuria 08/23/2011   Memory loss 12/12/2010   Diabetes mellitus type II, controlled, with no complications ([place]) 59/93/5701   Hyperlipidemia 07/14/2010   Depression with anxiety 07/14/2010   GLAUCOMA 07/14/2010   Allergic rhinitis 07/14/2010   Past Medical History:  Diagnosis Date   Allergic rhinitis    Anxiety    Asthma    Dysrhythmia    palpitations   Glaucoma    History of kidney stones    Hyperlipemia    Migraines    Palpitations    Pneumonia    hx   Urinary tract infection     Family History  Problem Relation Age of Onset   Diabetes Father    Colon cancer Neg Hx    Esophageal cancer Neg Hx    Rectal cancer Neg Hx    Stomach cancer Neg Hx    Breast cancer Neg Hx     Past Surgical  History:  Procedure Laterality Date   ABDOMINAL HYSTERECTOMY     Has ovaries   BACK SURGERY  1987   lumb   BREAST SURGERY Left 07/2016   bx- needle in breast   CARPAL TUNNEL RELEASE Right 04/05/2022   Procedure: RIGHT CARPAL TUNNEL RELEASE;  Surgeon: Leandrew Koyanagi, MD;  Location: [facility];  Service: Orthopedics;  Laterality: Right;   CARPAL TUNNEL RELEASE Left 06/28/2022   Procedure: LEFT CARPAL TUNNEL RELEASE;  Surgeon: Leandrew Koyanagi, MD;  Location: [facility];  Service: Orthopedics;  Laterality: Left;   CHOLECYSTECTOMY  09/26/13   CLOSED REDUCTION NASAL FRACTURE  04/29/2012   Procedure: CLOSED REDUCTION NASAL FRACTURE;   Surgeon: Jodi Marble, MD;  Location: [facility];  Service: ENT;  Laterality: N/A;  WITH STABILIZATION   COLONOSCOPY     Deviated septum repair     EYE SURGERY Bilateral    cataracts   FOOT SURGERY Bilateral    ingrown toe nails   LITHOTRIPSY     REVERSE SHOULDER ARTHROPLASTY Right 09/15/2016   Procedure: RIGHT REVERSE SHOULDER ARTHROPLASTY;  Surgeon: Netta Cedars, MD;  Location: [facility];  Service: Orthopedics;  Laterality: Right;   REVERSE SHOULDER ARTHROPLASTY Left 10/15/2020   Procedure: REVERSE SHOULDER ARTHROPLASTY;  Surgeon: Netta Cedars, MD;  Location: [facility];  Service: Orthopedics;  Laterality: Left;   ROTATOR CUFF REPAIR     right   TONSILLECTOMY     Social History   Occupational History   Occupation: Retired -Sports coach: RETIRED  Tobacco Use   Smoking status: Former    Packs/day: 0.10    Years: 4.00    Total pack years: 0.40    Types: Cigarettes    Quit date: 06/19/1986    Years since quitting: 36.0   Smokeless tobacco: Never   Tobacco comments:    smoked in college years ago  Vaping Use   Vaping Use: Never used  Substance and Sexual Activity   Alcohol use: Yes    Comment: 1-2 glasses week variety beer occ scotch   Drug use: No   Sexual activity: Not Currently    Birth control/protection: Surgical

## 2022-07-20 ENCOUNTER — Other Ambulatory Visit: Payer: Self-pay

## 2022-07-20 ENCOUNTER — Telehealth: Payer: Self-pay | Admitting: Family Medicine

## 2022-07-20 MED ORDER — ATORVASTATIN CALCIUM 20 MG PO TABS: ORAL_TABLET | ORAL | 1 refills | Status: DC

## 2022-07-20 NOTE — Telephone Encounter (Signed)
Sent in refill. Called patient let her know

## 2022-07-20 NOTE — Telephone Encounter (Signed)
Encourage patient to contact the pharmacy for refills or they can request refills through [facility],  - [address]: atorvastatin (LIPITOR) 20 MG tablet    NOTES/COMMENTS FROM PATIENT:      [place] office please notify patient: It takes 48-72 hours to process rx refill requests Ask patient to call pharmacy to ensure rx is ready before heading there.

## 2022-07-25 DIAGNOSIS — Z1231 Encounter for screening mammogram for malignant neoplasm of breast: Secondary | ICD-10-CM | POA: Diagnosis not present

## 2022-07-25 LAB — HM MAMMOGRAPHY

## 2022-07-26 ENCOUNTER — Encounter: Payer: Self-pay | Admitting: Physician Assistant

## 2022-07-26 ENCOUNTER — Ambulatory Visit (INDEPENDENT_AMBULATORY_CARE_PROVIDER_SITE_OTHER): Payer: Medicare Other | Admitting: Physician Assistant

## 2022-07-26 DIAGNOSIS — G5603 Carpal tunnel syndrome, bilateral upper limbs: Secondary | ICD-10-CM

## 2022-07-26 NOTE — Progress Notes (Signed)
Office Visit Note   Patient: [name]           Date of Birth: Sep 05, 1940           MRN: [id number] Visit Date: 07/26/2022              Requested by: Midge Minium, MD [address] [place],  [place] [address] PCP: Midge Minium, MD  Chief Complaint  Patient presents with   Left Wrist - Routine Post Op      HPI: Marlaine is a pleasant 82 year old woman who is now 4 weeks status post left carpal tunnel release.  She had a similar procedure on her right hand last November.  She reports this recovery has been much easier than the last.  Assessment & Plan: Visit Diagnoses: Left carpal tunnel release  Plan: She may return to activities as tolerated continue to do soft tissue massage and apply lotion to the left incision.  May follow-up as needed  Follow-Up Instructions:   Ortho Exam  Patient is alert, oriented, no adenopathy, well-dressed, normal affect, normal respiratory effort. Left wrist well-healed surgical incision no redness no swelling no evidence of infection.  She is able to oppose all of her fingers sensation is intact.  Grip strength is intact strong radial pulse  Imaging: No results found. No images are attached to the encounter.  Labs: Lab Results  Component Value Date   HGBA1C 6.4 01/11/2022   HGBA1C 6.3 (H) 10/14/2020   HGBA1C 6.5 03/09/2020   REPTSTATUS 05/18/2013 FINAL 05/16/2013   CULT  05/16/2013    Multiple bacterial morphotypes present, none predominant. Suggest appropriate recollection if clinically indicated. Performed at [facility] No Salmonella,Shigella,Campylobacter,Yersinia,or 12/07/2014   [facility] No E.coli 0157:H7 isolated. 12/07/2014     Lab Results  Component Value Date   ALBUMIN 3.9 02/03/2022   ALBUMIN 4.6 07/05/2021   ALBUMIN 4.5 06/01/2020    No results found for: "MG" No results found for: "VD25OH"  No results found for: "PREALBUMIN"    Latest Ref Rng & Units 02/03/2022    9:49 AM  01/31/2022   11:54 AM 01/11/2022   11:39 AM  CBC EXTENDED  WBC 4.0 - 10.5 K/uL 4.6  6.7  12.1   RBC 3.87 - 5.11 MIL/uL 3.85  4.59  4.01   Hemoglobin 12.0 - 15.0 g/dL 11.4  13.6  11.9   HCT 36.0 - 46.0 % 34.1  41.1  35.8   Platelets 150 - 400 K/uL 239  276.0  271.0   NEUT# 1.7 - 7.7 K/uL 2.5  4.0  11.1   Lymph# 0.7 - 4.0 K/uL 1.5  2.0  0.8      There is no height or weight on file to calculate BMI.  Orders:  No orders of the defined types were placed in this encounter.  No orders of the defined types were placed in this encounter.    Procedures: No procedures performed  Clinical Data: No additional findings.  ROS:  All other systems negative, except as noted in the HPI. Review of Systems  Objective: Vital Signs: There were no vitals taken for this visit.  Specialty Comments:  No specialty comments available.  PMFS History: Patient Active Problem List   Diagnosis Date Noted   Right carpal tunnel syndrome 04/04/2022   Left carpal tunnel syndrome 04/04/2022   Pain of left hip joint 06/03/2021   Gastro-esophageal reflux disease without esophagitis 10/15/2020   Disorders of muscle  in diseases classified elsewhere, multiple sites 10/15/2020   Other muscle spasm 10/15/2020   Post-traumatic osteoarthritis 08/10/2020   Overweight (BMI 25.0-29.9) 06/01/2020   Pain in left knee 05/27/2018   Thyroid nodule 03/13/2018   Chronic diarrhea 12/26/2017   S/P shoulder replacement, right 09/15/2016   Benign paroxysmal positional vertigo 10/14/2014   Back pain 08/27/2014   Decreased hearing of both ears 05/19/2014   S/P cholecystectomy 10/02/2013   Asthma, moderate persistent 05/16/2013   H/O recurrent pneumonia 05/16/2013   Insomnia 05/05/2013   Gluten intolerance 05/06/2012   Renal calculus, left 01/29/2012   Painful bladder spasm 12/14/2011   Lactose intolerance 12/14/2011   Post-menopausal 12/14/2011   Microscopic colitis 11/08/2011   Hepatic steatosis 08/25/2011    Pulmonary nodule, right 08/25/2011   Hematuria 08/23/2011   Memory loss 12/12/2010   Diabetes mellitus type II, controlled, with no complications ([place]) 05/39/7673   Hyperlipidemia 07/14/2010   Depression with anxiety 07/14/2010   GLAUCOMA 07/14/2010   Allergic rhinitis 07/14/2010   Past Medical History:  Diagnosis Date   Allergic rhinitis    Anxiety    Asthma    Dysrhythmia    palpitations   Glaucoma    History of kidney stones    Hyperlipemia    Migraines    Palpitations    Pneumonia    hx   Urinary tract infection     Family History  Problem Relation Age of Onset   Diabetes Father    Colon cancer Neg Hx    Esophageal cancer Neg Hx    Rectal cancer Neg Hx    Stomach cancer Neg Hx    Breast cancer Neg Hx     Past Surgical History:  Procedure Laterality Date   ABDOMINAL HYSTERECTOMY     Has ovaries   BACK SURGERY  1987   lumb   BREAST SURGERY Left 07/2016   bx- needle in breast   CARPAL TUNNEL RELEASE Right 04/05/2022   Procedure: RIGHT CARPAL TUNNEL RELEASE;  Surgeon: Leandrew Koyanagi, MD;  Location: [facility];  Service: Orthopedics;  Laterality: Right;   CARPAL TUNNEL RELEASE Left 06/28/2022   Procedure: LEFT CARPAL TUNNEL RELEASE;  Surgeon: Leandrew Koyanagi, MD;  Location: [facility] [place];  Service: Orthopedics;  Laterality: Left;   CHOLECYSTECTOMY  09/26/13   CLOSED REDUCTION NASAL FRACTURE  04/29/2012   Procedure: CLOSED REDUCTION NASAL FRACTURE;  Surgeon: Jodi Marble, MD;  Location: [facility];  Service: ENT;  Laterality: N/A;  WITH STABILIZATION   COLONOSCOPY     Deviated septum repair     EYE SURGERY Bilateral    cataracts   FOOT SURGERY Bilateral    ingrown toe nails   LITHOTRIPSY     REVERSE SHOULDER ARTHROPLASTY Right 09/15/2016   Procedure: RIGHT REVERSE SHOULDER ARTHROPLASTY;  Surgeon: Netta Cedars, MD;  Location: [facility];  Service: Orthopedics;  Laterality: Right;   REVERSE SHOULDER ARTHROPLASTY Left 10/15/2020    Procedure: REVERSE SHOULDER ARTHROPLASTY;  Surgeon: Netta Cedars, MD;  Location: [facility];  Service: Orthopedics;  Laterality: Left;   ROTATOR CUFF REPAIR     right   TONSILLECTOMY     Social History   Occupational History   Occupation: Retired -Sports coach: RETIRED  Tobacco Use   Smoking status: Former    Packs/day: 0.10    Years: 4.00    Total pack years: 0.40    Types: Cigarettes    Quit date: 06/19/1986  Years since quitting: 36.1   Smokeless tobacco: Never   Tobacco comments:    smoked in college years ago  Vaping Use   Vaping Use: Never used  Substance and Sexual Activity   Alcohol use: Yes    Comment: 1-2 glasses week variety beer occ scotch   Drug use: No   Sexual activity: Not Currently    Birth control/protection: Surgical

## 2022-08-08 DIAGNOSIS — M25561 Pain in right knee: Secondary | ICD-10-CM | POA: Diagnosis not present

## 2022-08-10 LAB — HM DEXA SCAN

## 2022-08-22 ENCOUNTER — Ambulatory Visit (INDEPENDENT_AMBULATORY_CARE_PROVIDER_SITE_OTHER): Payer: Medicare Other | Admitting: Family Medicine

## 2022-08-22 ENCOUNTER — Encounter: Payer: Self-pay | Admitting: Family Medicine

## 2022-08-22 VITALS — BP 116/80 | HR 80 | Temp 98.2°F | Resp 17 | Ht 63.0 in | Wt 151.0 lb

## 2022-08-22 DIAGNOSIS — E119 Type 2 diabetes mellitus without complications: Secondary | ICD-10-CM | POA: Diagnosis not present

## 2022-08-22 DIAGNOSIS — E782 Mixed hyperlipidemia: Secondary | ICD-10-CM | POA: Diagnosis not present

## 2022-08-22 DIAGNOSIS — M810 Age-related osteoporosis without current pathological fracture: Secondary | ICD-10-CM

## 2022-08-22 LAB — CBC WITH DIFFERENTIAL/PLATELET
Basophils Absolute: 0.1 10*3/uL (ref 0.0–0.1)
Basophils Relative: 1 % (ref 0.0–3.0)
Eosinophils Absolute: 0.1 10*3/uL (ref 0.0–0.7)
Eosinophils Relative: 2.1 % (ref 0.0–5.0)
HCT: 41.3 % (ref 36.0–46.0)
Hemoglobin: 13.6 g/dL (ref 12.0–15.0)
Lymphocytes Relative: 29.3 % (ref 12.0–46.0)
Lymphs Abs: 1.8 10*3/uL (ref 0.7–4.0)
MCHC: 32.9 g/dL (ref 30.0–36.0)
MCV: 90.1 fl (ref 78.0–100.0)
Monocytes Absolute: 0.5 10*3/uL (ref 0.1–1.0)
Monocytes Relative: 7.7 % (ref 3.0–12.0)
Neutro Abs: 3.8 10*3/uL (ref 1.4–7.7)
Neutrophils Relative %: 59.9 % (ref 43.0–77.0)
Platelets: 311 10*3/uL (ref 150.0–400.0)
RBC: 4.59 Mil/uL (ref 3.87–5.11)
RDW: 13.5 % (ref 11.5–15.5)
WBC: 6.3 10*3/uL (ref 4.0–10.5)

## 2022-08-22 LAB — BASIC METABOLIC PANEL
BUN: 32 mg/dL — ABNORMAL HIGH (ref 6–23)
CO2: 29 mEq/L (ref 19–32)
Calcium: 11.2 mg/dL — ABNORMAL HIGH (ref 8.4–10.5)
Chloride: 101 mEq/L (ref 96–112)
Creatinine, Ser: 0.98 mg/dL (ref 0.40–1.20)
GFR: 53.63 mL/min — ABNORMAL LOW (ref >60.00)
Glucose, Bld: 68 mg/dL — ABNORMAL LOW (ref 70–99)
Potassium: 4 mEq/L (ref 3.5–5.1)
Sodium: 139 mEq/L (ref 135–145)

## 2022-08-22 LAB — LIPID PANEL
Cholesterol: 111 mg/dL (ref 0–200)
HDL: 52.8 mg/dL (ref >39.00)
LDL Cholesterol: 41 mg/dL (ref 0–99)
NonHDL: 57.82
Total CHOL/HDL Ratio: 2
Triglycerides: 84 mg/dL (ref 0.0–149.0)
VLDL: 16.8 mg/dL (ref 0.0–40.0)

## 2022-08-22 LAB — HEPATIC FUNCTION PANEL
ALT: 14 U/L (ref 0–35)
AST: 21 U/L (ref 0–37)
Albumin: 4.4 g/dL (ref 3.5–5.2)
Alkaline Phosphatase: 41 U/L (ref 39–117)
Bilirubin, Direct: 0.1 mg/dL (ref 0.0–0.3)
Total Bilirubin: 0.5 mg/dL (ref 0.2–1.2)
Total Protein: 7.3 g/dL (ref 6.0–8.3)

## 2022-08-22 LAB — MICROALBUMIN / CREATININE URINE RATIO
Creatinine,U: 141.6 mg/dL
Microalb Creat Ratio: 1.4 mg/g (ref 0.0–30.0)
Microalb, Ur: 2 mg/dL — ABNORMAL HIGH (ref 0.0–1.9)

## 2022-08-22 LAB — TSH: TSH: 2.4 u[IU]/mL (ref 0.35–5.50)

## 2022-08-22 LAB — HEMOGLOBIN A1C: Hgb A1c MFr Bld: 6.3 % (ref 4.6–6.5)

## 2022-08-22 MED ORDER — ALENDRONATE SODIUM 70 MG PO TABS: 70.0000 mg | ORAL_TABLET | ORAL | 3 refills | Status: AC

## 2022-08-22 NOTE — Patient Instructions (Addendum)
Follow up in 6 months to recheck sugar and cholesterol Well notify you of your lab results and make any changes if need Keep up the good work on healthy diet and regular exercise- you look great!! START the Alendronate (Fosamax) once weekly Call with any questions or concerns Stay Safe!  Stay Healthy! Happy Spring!!!

## 2022-08-22 NOTE — Progress Notes (Signed)
   Subjective:    Patient ID: [name], female    DOB: [birth year], 82 y.o.   MRN: [id number]  HPI Hyperlipidemia- chronic problem, on Lipitor 20mg  daily, Fenofibrate 145mg  daily.  No abd pain, N/V.  DM- chronic problem, was previously seeing Endo but has not seen Dr Posey Pronto recently.  Due for foot exam, eye exam, microalbumin, and A1C.  Currently diet controlled and not on medication.  'i have been eating like a horse'.  No CP, SOB, HA's, visual changes.  Denies numbness/tingling of hands/feet.  She is walking at least 3x/week.    Osteoporosis- pt wants to review her recent DEXA scan.   Review of Systems For ROS see HPI     Objective:   Physical Exam Vitals reviewed.  Constitutional:      General: She is not in acute distress.    Appearance: Normal appearance. She is well-developed. She is not ill-appearing.  HENT:     Head: Normocephalic and atraumatic.  Eyes:     Conjunctiva/sclera: Conjunctivae normal.     Pupils: Pupils are equal, round, and reactive to light.  Neck:     Thyroid: No thyromegaly.  Cardiovascular:     Rate and Rhythm: Normal rate and regular rhythm.     Pulses: Normal pulses.     Heart sounds: Normal heart sounds. No murmur heard. Pulmonary:     Effort: Pulmonary effort is normal. No respiratory distress.     Breath sounds: Normal breath sounds.  Abdominal:     General: There is no distension.     Palpations: Abdomen is soft.     Tenderness: There is no abdominal tenderness.  Musculoskeletal:     Cervical back: Normal range of motion and neck supple.     Right lower leg: No edema.     Left lower leg: No edema.  Lymphadenopathy:     Cervical: No cervical adenopathy.  Skin:    General: Skin is warm and dry.  Neurological:     Mental Status: She is alert and oriented to person, place, and time.  Psychiatric:        Behavior: Behavior normal.          Assessment & Plan:

## 2022-08-23 ENCOUNTER — Telehealth: Payer: Self-pay

## 2022-08-23 NOTE — Telephone Encounter (Signed)
-----   Message from Midge Minium, MD sent at 08/23/2022  7:08 AM EST ----- Labs look great!!  A1C is FANTASTIC at 6.3%  Calcium is mildly elevated but we will repeat this at your next visit.  No cause for concern at this time

## 2022-08-23 NOTE — Telephone Encounter (Signed)
Left Vm stating lab results

## 2022-08-28 ENCOUNTER — Telehealth: Payer: Self-pay | Admitting: Family Medicine

## 2022-08-28 NOTE — Telephone Encounter (Signed)
Caller name: RAYYAN CUE  On DPR?: Yes  Call back number: [phone] (home)  Provider they see: Midge Minium, MD  Reason for call:Pt would like 5 year friend to see a back doctor. Mrs Glade wants a good recommendation. You can email her if too busy -Advise.

## 2022-08-29 NOTE — Telephone Encounter (Signed)
I don't feel comfortable giving advice to someone that isn't our patient.  Please review w/ front desk that we cannot provide advice to people we do not see

## 2022-09-03 DIAGNOSIS — M810 Age-related osteoporosis without current pathological fracture: Secondary | ICD-10-CM | POA: Insufficient documentation

## 2022-09-03 NOTE — Assessment & Plan Note (Signed)
Chronic problem.  Was previously seeing Endo but has not been seen recently.  Foot exam done, microalbumin and A1C ordered.  Pt encouraged to schedule eye exam.  Currently diet controlled.  Currently asymptomatic.  Check labs and adjust plan prn.

## 2022-09-03 NOTE — Assessment & Plan Note (Signed)
Chronic problem, on Lipitor 20mg  daily and Fenofibrate 145mg  daily.  Currently asymptomatic.  Check labs.  Adjust meds prn

## 2022-09-03 NOTE — Assessment & Plan Note (Signed)
New.  Reviewed DEXA scan w/ pt.  L forearm w/ osteoporosis and multiple sites w/ osteopenia.  Start Fosamax.  Will follow.

## 2022-09-04 ENCOUNTER — Other Ambulatory Visit: Payer: Self-pay | Admitting: Family Medicine

## 2022-09-27 ENCOUNTER — Telehealth: Payer: Self-pay | Admitting: Family Medicine

## 2022-09-27 NOTE — Telephone Encounter (Signed)
What medication would you suggest pt tries before making an apt ?

## 2022-09-27 NOTE — Telephone Encounter (Signed)
OTC Pepcid (Famotidine) would be a great starting point.  If no improvement, please schedule an appt

## 2022-09-27 NOTE — Telephone Encounter (Signed)
Caller name: JARYA JURS  On DPR?: Yes  Call back number: [phone] (home)  Provider they see: Sheliah Hatch, MD  Reason for call: Pt feels like she has acid reflux with a scratchy throat first thing in the morning.

## 2022-09-27 NOTE — Telephone Encounter (Signed)
I  spoke to the pt and advised to try to the Famotidine and if that does not help she will  need an apt. Pt expressed verbal understanding

## 2022-10-02 ENCOUNTER — Ambulatory Visit (INDEPENDENT_AMBULATORY_CARE_PROVIDER_SITE_OTHER): Payer: Medicare Other | Admitting: Family Medicine

## 2022-10-02 ENCOUNTER — Encounter: Payer: Self-pay | Admitting: Family Medicine

## 2022-10-02 VITALS — BP 124/58 | HR 69 | Temp 98.9°F | Resp 17 | Ht 63.0 in | Wt 153.5 lb

## 2022-10-02 DIAGNOSIS — J029 Acute pharyngitis, unspecified: Secondary | ICD-10-CM | POA: Diagnosis not present

## 2022-10-02 LAB — POCT RAPID STREP A (OFFICE): Rapid Strep A Screen: NEGATIVE

## 2022-10-02 MED ORDER — FAMOTIDINE 40 MG PO TABS: 40.0000 mg | ORAL_TABLET | Freq: Every day | ORAL | 3 refills | Status: DC

## 2022-10-02 MED ORDER — PREDNISONE 10 MG PO TABS: ORAL_TABLET | ORAL | 0 refills | Status: DC

## 2022-10-02 NOTE — Patient Instructions (Signed)
Follow up as needed or as scheduled START the Prednisone as directed- 3 pills at the same time x3 days, then 2 pills at the same time x3 days, then 1 pill daily.  Take w/ food  RESTART the prescription Famotidine Drink LOTS of water Call with any questions or concerns Stay Safe!  Stay Healthy! Hang in there!!

## 2022-10-02 NOTE — Progress Notes (Signed)
   Subjective:    Patient ID: [name], female    DOB: 04-30-1941, 82 y.o.   MRN: [id number]  HPI Sore throat- pt reports having the flu 3 weeks ago.  Pt reports her sore throat had started to improve but has again worsened.  Feels fatigued.  No fever.  + hoarseness.  Denies congestion/drainage.  Thought sxs may be reflux related but no improvement w/ medication.  Taking Allertec daily.   Review of Systems For ROS see HPI     Objective:   Physical Exam Vitals reviewed.  Constitutional:      General: She is not in acute distress.    Appearance: She is well-developed. She is not ill-appearing.  HENT:     Head: Normocephalic and atraumatic.     Right Ear: Tympanic membrane normal.     Left Ear: Tympanic membrane normal.     Nose: Mucosal edema and congestion present. No rhinorrhea.     Right Sinus: No maxillary sinus tenderness or frontal sinus tenderness.     Left Sinus: No maxillary sinus tenderness or frontal sinus tenderness.     Mouth/Throat:     Mouth: Mucous membranes are moist.     Pharynx: Posterior oropharyngeal erythema (w/ PND) present.  Eyes:     Conjunctiva/sclera: Conjunctivae normal.     Pupils: Pupils are equal, round, and reactive to light.  Cardiovascular:     Rate and Rhythm: Normal rate and regular rhythm.     Heart sounds: Normal heart sounds.  Pulmonary:     Effort: Pulmonary effort is normal. No respiratory distress.     Breath sounds: Normal breath sounds. No wheezing or rales.  Musculoskeletal:     Cervical back: Normal range of motion and neck supple.  Lymphadenopathy:     Cervical: No cervical adenopathy.  Skin:    General: Skin is warm and dry.  Neurological:     General: No focal deficit present.     Mental Status: She is alert and oriented to person, place, and time.  Psychiatric:        Mood and Affect: Mood normal.        Behavior: Behavior normal.        Thought Content: Thought content normal.           Assessment & Plan:   Sore throat- new.  Suspect this is a combination of PND and silent reflux.  Pt indicated that she just restarted acid reducer 2-3 days ago which is not likely enough time to show improvement.  Will do low dose prednisone taper to improve allergy congestion and PND and start prescription strength Famotidine.  Reviewed supportive care and red flags that should prompt return.  Pt expressed understanding and is in agreement w/ plan.

## 2022-10-09 ENCOUNTER — Other Ambulatory Visit: Payer: Self-pay | Admitting: Family Medicine

## 2022-10-09 ENCOUNTER — Telehealth: Payer: Self-pay | Admitting: Family Medicine

## 2022-10-09 NOTE — Telephone Encounter (Signed)
Spoke with patient. Patient is aware of Dr.Tabori message. Patient doesn't agree with Dr.Tabori message. I tried to scheduled patient an appt. Patient refused and hung up the phone.

## 2022-10-09 NOTE — Telephone Encounter (Signed)
Pharmacy:  [facility] - [place], [place] - [address]

## 2022-10-09 NOTE — Telephone Encounter (Signed)
She's supposed to be taking 3 at a time for a total of  x3 days, then  x3 days, then 10 mg daily.  She is not supposed to only take .  Please verify she is taking correctly

## 2022-10-09 NOTE — Telephone Encounter (Signed)
I never thought she was illiterate but multiple people have had issues w/ those same directions.  She needs to make sure she is taking the Famotidine (acid reducer) daily, a daily allergy medication (Loratidine, Cetirizine, Fexofenadine) to decrease post nasal drip, increase her water intake to rinse the drainage off the back of her throat.  Now that she is done w/ the Prednisone she can take Ibuprofen or Motrin as needed for sore throat  This should improve w/ time.  If no better by late week, recommend re-evaluation

## 2022-10-09 NOTE — Telephone Encounter (Signed)
Error

## 2022-10-09 NOTE — Telephone Encounter (Signed)
Patitne states prednisone not working, and she would like something else sent in, please

## 2022-10-09 NOTE — Telephone Encounter (Signed)
Scheduled

## 2022-10-09 NOTE — Telephone Encounter (Signed)
Pt notes she has taken her last one this morning and did follow directions (pt became upset and stated "I know how to read directions I'm not illiterate") notes still no better

## 2022-10-09 NOTE — Telephone Encounter (Signed)
Caller name: JOORY GOUGH  On DPR?: Yes  Call back number: [phone] (home)  Provider they see: Sheliah Hatch, MD  Reason for call: Pt states that predniSONE (DELTASONE) 10 MG tablet isn't working- she'd like something else -advise

## 2022-10-10 ENCOUNTER — Encounter: Payer: Self-pay | Admitting: Family Medicine

## 2022-10-10 ENCOUNTER — Ambulatory Visit (INDEPENDENT_AMBULATORY_CARE_PROVIDER_SITE_OTHER): Payer: Medicare Other | Admitting: Family Medicine

## 2022-10-10 VITALS — BP 118/80 | HR 66 | Temp 98.0°F | Resp 17 | Ht 63.0 in | Wt 153.1 lb

## 2022-10-10 DIAGNOSIS — B9689 Other specified bacterial agents as the cause of diseases classified elsewhere: Secondary | ICD-10-CM

## 2022-10-10 DIAGNOSIS — J329 Chronic sinusitis, unspecified: Secondary | ICD-10-CM

## 2022-10-10 MED ORDER — AMOXICILLIN 875 MG PO TABS: 875.0000 mg | ORAL_TABLET | Freq: Two times a day (BID) | ORAL | 0 refills | Status: AC

## 2022-10-10 MED ORDER — AZELASTINE HCL 0.1 % NA SOLN: 1.0000 | Freq: Two times a day (BID) | NASAL | 5 refills | Status: DC

## 2022-10-10 NOTE — Progress Notes (Signed)
   Subjective:    Patient ID: [name], female    DOB: 03/06/1941, 82 y.o.   MRN: [id number]  HPI URI- pt reports ongoing issues w/ congestion, drainage, sore throat, fatigue.  No relief w/ Prednisone taper.  Head feels 'full of cotton'.  No tooth pain.  Some cough.  Taking Allertec daily.  + facial 'tightness'.   Review of Systems For ROS see HPI     Objective:   Physical Exam Vitals reviewed.  Constitutional:      General: She is not in acute distress.    Appearance: She is well-developed.  HENT:     Head: Normocephalic and atraumatic.     Right Ear: Tympanic membrane normal.     Left Ear: Tympanic membrane normal.     Nose: Mucosal edema and congestion present. No rhinorrhea.     Right Sinus: Maxillary sinus tenderness and frontal sinus tenderness present.     Left Sinus: Maxillary sinus tenderness and frontal sinus tenderness present.     Mouth/Throat:     Pharynx: Uvula midline. Posterior oropharyngeal erythema (copious PND) present. No oropharyngeal exudate.     Tonsils: No tonsillar exudate or tonsillar abscesses.  Eyes:     Conjunctiva/sclera: Conjunctivae normal.     Pupils: Pupils are equal, round, and reactive to light.  Cardiovascular:     Rate and Rhythm: Normal rate and regular rhythm.     Heart sounds: Normal heart sounds.  Pulmonary:     Effort: Pulmonary effort is normal. No respiratory distress.     Breath sounds: Normal breath sounds. No wheezing.  Musculoskeletal:     Cervical back: Normal range of motion and neck supple.  Lymphadenopathy:     Cervical: No cervical adenopathy.  Skin:    General: Skin is warm and dry.  Neurological:     General: No focal deficit present.     Mental Status: She is oriented to person, place, and time.     Cranial Nerves: No cranial nerve deficit.     Motor: No weakness.     Coordination: Coordination normal.  Psychiatric:        Mood and Affect: Mood normal.        Behavior: Behavior normal.        Thought  Content: Thought content normal.           Assessment & Plan:  Bacterial sinusitis- pt's duration of sxs and lack of improvement w/ Prednisone suggest bacterial infxn.  Start high dose Amoxicillin to penetrate the sinuses.  Add Astelin in addition to Allertec and Singulair.  Reviewed supportive care and red flags that should prompt return.  Pt expressed understanding and is in agreement w/ plan.

## 2022-10-10 NOTE — Patient Instructions (Signed)
Follow up as needed or as scheduled START the Amoxicillin twice daily- take w/ food ADD the Azelastine nasal spray CONTINUE the Allertec and Montelukast daily Drink LOTS of fluids to rinse the back of your throat Tylenol or Ibuprofen as needed for headache, sore throat Call with any questions or concerns Hang in there!!!

## 2022-10-13 ENCOUNTER — Telehealth: Payer: Self-pay | Admitting: Family Medicine

## 2022-10-13 NOTE — Telephone Encounter (Signed)
Caller name: HEIDI MACLIN  On DPR?: Yes  Call back number: [phone] (home)  Provider they see: Sheliah Hatch, MD  Reason for call: Pt called to say she's feeling much better and the medication is working. And you're the best doctor in the world.

## 2022-10-13 NOTE — Telephone Encounter (Signed)
Pt is feeling much much better .

## 2022-10-18 NOTE — Telephone Encounter (Signed)
Please advise when you are available

## 2022-10-18 NOTE — Telephone Encounter (Signed)
Patient called stating that her throat is still bothering her and patient is still hoarse. Patient wants to know what should she do next. Do patient need an appt?

## 2022-10-19 NOTE — Telephone Encounter (Signed)
Pt is taking alertec, finished abx will try tea and honey and keep going

## 2022-10-19 NOTE — Telephone Encounter (Signed)
Vocal rest, hot tea, continued use of allergy medication and nasal spray will all help

## 2022-10-26 DIAGNOSIS — H401131 Primary open-angle glaucoma, bilateral, mild stage: Secondary | ICD-10-CM | POA: Diagnosis not present

## 2022-10-26 DIAGNOSIS — Z961 Presence of intraocular lens: Secondary | ICD-10-CM | POA: Diagnosis not present

## 2022-10-26 DIAGNOSIS — H35373 Puckering of macula, bilateral: Secondary | ICD-10-CM | POA: Diagnosis not present

## 2022-10-26 DIAGNOSIS — H43813 Vitreous degeneration, bilateral: Secondary | ICD-10-CM | POA: Diagnosis not present

## 2022-11-01 ENCOUNTER — Other Ambulatory Visit: Payer: Self-pay

## 2022-11-01 ENCOUNTER — Telehealth: Payer: Self-pay | Admitting: Family Medicine

## 2022-11-01 DIAGNOSIS — L819 Disorder of pigmentation, unspecified: Secondary | ICD-10-CM

## 2022-11-01 NOTE — Telephone Encounter (Signed)
Ms Lieselotte called back and got an appt at [facility] January 09, 2023. Needs a referral sent over.

## 2022-11-01 NOTE — Telephone Encounter (Signed)
Can we place a new referral for pt ?

## 2022-11-01 NOTE — Telephone Encounter (Signed)
Ok to place derm referral for [facility]

## 2022-11-01 NOTE — Telephone Encounter (Signed)
Referral placed and pt has been made aware. 

## 2022-11-01 NOTE — Telephone Encounter (Signed)
Caller name: COLINDA ZYLA  On DPR?: Yes  Call back number: [phone] (home)  Provider they see: Sheliah Hatch, MD  Reason for call:  Dermatologist stated not taking on new pts not unless they skin cancer. She'd like to try [place] but needs a referral.

## 2022-11-15 ENCOUNTER — Telehealth: Payer: Self-pay | Admitting: Family Medicine

## 2022-11-15 NOTE — Telephone Encounter (Signed)
Pt called and she states she wants to go back on Ozempic . She states she is not diabetic but her problem list still has that she is Diabetic 2 . Can you advise if she needs this medicine ?

## 2022-11-15 NOTE — Telephone Encounter (Addendum)
Caller name: DEANNDRA COOPRIDER  On DPR?: Yes  Call back number: [phone] (home)  Provider they see: Sheliah Hatch, MD  Reason for call:   Pt would like to take weight injection she had Jan or Oct 2023? She would like it for 4 months.  Pharmacy -[facility]   It's Ozempic. ---Advise

## 2022-11-15 NOTE — Telephone Encounter (Signed)
Looks like patient wants to get back on Ozempic. Please advise

## 2022-11-16 ENCOUNTER — Ambulatory Visit (INDEPENDENT_AMBULATORY_CARE_PROVIDER_SITE_OTHER): Payer: Medicare Other | Admitting: *Deleted

## 2022-11-16 DIAGNOSIS — Z0000 Encounter for general adult medical examination without abnormal findings: Secondary | ICD-10-CM

## 2022-11-16 MED ORDER — OZEMPIC (0.25 OR 0.5 MG/DOSE) 2 MG/3ML ~~LOC~~ SOPN: 0.2500 mg | PEN_INJECTOR | SUBCUTANEOUS | 0 refills | Status: DC

## 2022-11-16 NOTE — Telephone Encounter (Signed)
I have notified the pt that the medication has been sent in

## 2022-11-16 NOTE — Progress Notes (Signed)
Subjective:   [name] is a 82 y.o. female who presents for Medicare Annual (Subsequent) preventive examination.  I connected with  [name] on 11/16/22 by a telephone  enabled telemedicine application and verified that I am speaking with the correct person using two identifiers.   I discussed the limitations of evaluation and management by telemedicine. The patient expressed understanding and agreed to proceed.  Patient location: home  Provider location:  telephone/home    Review of Systems     Cardiac Risk Factors include: advanced age (>6men, >43 women);hypertension     Objective:    Today's Vitals   There is no height or weight on file to calculate BMI.     11/16/2022    9:45 AM 06/28/2022   10:24 AM 06/22/2022    2:45 PM 04/05/2022   12:12 PM 04/04/2022    3:26 PM 11/03/2021    9:20 AM 10/13/2020   10:07 AM  Advanced Directives  Does Patient Have a Medical Advance Directive? Yes Yes Yes Yes Yes Yes Yes  Type of Estate agent of [place];Living will Healthcare Power of [place];Living will Healthcare Power of [place];Living will  Healthcare Power of [place];Living will Healthcare Power of [place];Living will Healthcare Power of [place];Living will  Does patient want to make changes to medical advance directive?  No - Patient declined No - Patient declined No - Patient declined     Copy of Healthcare Power of Attorney in Chart? Yes - validated most recent copy scanned in chart (See row information) No - copy requested    No - copy requested No - copy requested  Would patient like information on creating a medical advance directive?  No - Patient declined  No - Patient declined       Current Medications (verified) Outpatient Encounter Medications as of 11/16/2022  Medication Sig   albuterol (VENTOLIN HFA) 108 (90 Base) MCG/ACT inhaler Inhale into the lungs every 6 (six) hours as needed for wheezing or shortness of breath.   alendronate  (FOSAMAX) 70 MG tablet Take 1 tablet (70 mg total) by mouth every 7 (seven) days. Take with a full glass of water on an empty stomach.   atorvastatin (LIPITOR) 20 MG tablet TAKE ONE (1) TABLET BY MOUTH EVERY DAY   azelastine (ASTELIN) 0.1 % nasal spray Place 1 spray into both nostrils 2 (two) times daily. Use in each nostril as directed   Biotin 5000 MCG CAPS Take 5,000 mcg by mouth daily.   budesonide (ENTOCORT EC) 3 MG 24 hr capsule TAKE TWO CAPSULES BY MOUTH EACH DAY   CALCIUM PO Take 1 capsule by mouth daily.   citalopram (CELEXA) 40 MG tablet TAKE ONE (1) TABLET BY MOUTH EVERY DAY   famotidine (PEPCID) 40 MG tablet Take 1 tablet (40 mg total) by mouth daily.   fenofibrate (TRICOR) 145 MG tablet TAKE ONE TABLET BY MOUTH EVERY MORNING AFTER BREAKFAST   ibuprofen (ADVIL) 200 MG tablet Take 200 mg by mouth every 6 (six) hours as needed.   LUMIGAN 0.01 % SOLN Place 1 drop into both eyes at bedtime.    Melatonin 10 MG TABS Take by mouth.   montelukast (SINGULAIR) 10 MG tablet TAKE 1 TABLET BY MOUTH AT BEDTIME   Multiple Vitamins-Minerals (CENTRUM SILVER PO) Take 1 tablet by mouth daily.   mupirocin ointment (BACTROBAN) 2 % Apply to incision twice daily until completely healed (Patient not taking: Reported on 11/16/2022)   predniSONE (DELTASONE) 10 MG tablet 3  tabs x3 days and then 2 tabs x3 days and then 1 tab x3 days.  Take w/ food. (Patient not taking: Reported on 10/10/2022)   No facility-administered encounter medications on file as of 11/16/2022.    Allergies (verified) Papain, Papaya derivatives, Mushroom ext cmplx(shiitake-reishi-mait), Mushroom extract complex, and Venlafaxine   History: Past Medical History:  Diagnosis Date   Allergic rhinitis    Anxiety    Asthma    Dysrhythmia    palpitations   Glaucoma    History of kidney stones    Hyperlipemia    Migraines    Palpitations    Pneumonia    hx   Urinary tract infection    Past Surgical History:  Procedure Laterality  Date   ABDOMINAL HYSTERECTOMY     Has ovaries   BACK SURGERY  1987   lumb   BREAST SURGERY Left 07/2016   bx- needle in breast   CARPAL TUNNEL RELEASE Right 04/05/2022   Procedure: RIGHT CARPAL TUNNEL RELEASE;  Surgeon: Tarry Kos, MD;  Location: [facility];  Service: Orthopedics;  Laterality: Right;   CARPAL TUNNEL RELEASE Left 06/28/2022   Procedure: LEFT CARPAL TUNNEL RELEASE;  Surgeon: Tarry Kos, MD;  Location: [facility];  Service: Orthopedics;  Laterality: Left;   CHOLECYSTECTOMY  09/26/13   CLOSED REDUCTION NASAL FRACTURE  04/29/2012   Procedure: CLOSED REDUCTION NASAL FRACTURE;  Surgeon: Flo Shanks, MD;  Location: [facility];  Service: ENT;  Laterality: N/A;  WITH STABILIZATION   COLONOSCOPY     Deviated septum repair     EYE SURGERY Bilateral    cataracts   FOOT SURGERY Bilateral    ingrown toe nails   LITHOTRIPSY     REVERSE SHOULDER ARTHROPLASTY Right 09/15/2016   Procedure: RIGHT REVERSE SHOULDER ARTHROPLASTY;  Surgeon: Beverely Low, MD;  Location: [facility];  Service: Orthopedics;  Laterality: Right;   REVERSE SHOULDER ARTHROPLASTY Left 10/15/2020   Procedure: REVERSE SHOULDER ARTHROPLASTY;  Surgeon: Beverely Low, MD;  Location: [facility];  Service: Orthopedics;  Laterality: Left;   ROTATOR CUFF REPAIR     right   TONSILLECTOMY     Family History  Problem Relation Age of Onset   Diabetes Father    Colon cancer Neg Hx    Esophageal cancer Neg Hx    Rectal cancer Neg Hx    Stomach cancer Neg Hx    Breast cancer Neg Hx    Social History   Socioeconomic History   Marital status: Widowed    Spouse name: Not on file   Number of children: 2   Years of education: Not on file   Highest education level: Not on file  Occupational History   Occupation: Retired -Psychiatrist: RETIRED  Tobacco Use   Smoking status: Former    Packs/day: 0.10    Years: 4.00    Additional pack years: 0.00    Total pack years:  0.40    Types: Cigarettes    Quit date: 06/19/1986    Years since quitting: 36.4   Smokeless tobacco: Never   Tobacco comments:    smoked in college years ago  Vaping Use   Vaping Use: Never used  Substance and Sexual Activity   Alcohol use: Yes    Comment: 1-2 glasses week variety beer occ scotch   Drug use: No   Sexual activity: Not Currently    Birth control/protection: Surgical  Other Topics Concern   Not  on file  Social History Narrative   Daily caffeine    Social Determinants of Health   Financial Resource Strain: Low Risk  (11/16/2022)   Overall Financial Resource Strain (CARDIA)    Difficulty of Paying Living Expenses: Not hard at all  Food Insecurity: No Food Insecurity (11/16/2022)   Hunger Vital Sign    Worried About Running Out of Food in the Last Year: Never true    Ran Out of Food in the Last Year: Never true  Transportation Needs: No Transportation Needs (11/16/2022)   PRAPARE - Administrator, Civil Service (Medical): No    Lack of Transportation (Non-Medical): No  Physical Activity: Sufficiently Active (11/16/2022)   Exercise Vital Sign    Days of Exercise per Week: 4 days    Minutes of Exercise per Session: 40 min  Stress: No Stress Concern Present (11/16/2022)   [employer] of Occupational Health - Occupational Stress Questionnaire    Feeling of Stress : Not at all  Social Connections: Moderately Integrated (11/16/2022)   Social Connection and Isolation Panel [NHANES]    Frequency of Communication with Friends and Family: More than three times a week    Frequency of Social Gatherings with Friends and Family: More than three times a week    Attends Religious Services: More than 4 times per year    Active Member of [employer] or Organizations: Yes    Attends Banker Meetings: More than 4 times per year    Marital Status: Widowed  Recent Concern: Social Connections - Moderately Isolated (11/16/2022)   Social Connection and Isolation  Panel [NHANES]    Frequency of Communication with Friends and Family: More than three times a week    Frequency of Social Gatherings with Friends and Family: Three times a week    Attends Religious Services: More than 4 times per year    Active Member of Clubs or Organizations: No    Attends Banker Meetings: Never    Marital Status: Widowed    Tobacco Counseling Counseling given: Not Answered Tobacco comments: smoked in college years ago   Clinical Intake:  Pre-visit preparation completed: Yes  Pain : No/denies pain     Nutritional Risks: None Diabetes: No  How often do you need to have someone help you when you read instructions, pamphlets, or other written materials from your doctor or pharmacy?: 1 - Never  Diabetic?  no  Interpreter Needed?: No  Information entered by :: Remi Haggard LPN   Activities of Daily Living    11/16/2022    9:47 AM 06/28/2022   10:36 AM  In your present state of health, do you have any difficulty performing the following activities:  Hearing? 1 0  Vision? 0 0  Difficulty concentrating or making decisions? 0 0  Walking or climbing stairs? 0 0  Dressing or bathing? 0 0  Doing errands, shopping? 0   Preparing Food and eating ? N   Using the Toilet? N   In the past six months, have you accidently leaked urine? Y   Do you have problems with loss of bowel control? N   Managing your Medications? N   Managing your Finances? N   Housekeeping or managing your Housekeeping? N     Patient Care Team: Sheliah Hatch, MD as PCP - General (Family Medicine) Royden Purl, AUD (Audiology) Beverely Low, MD as Consulting Physician (Orthopedic Surgery) Hollar, Ronal Fear, MD as Referring Physician (Dermatology) Rachael Fee, MD  as Attending Physician (Gastroenterology) Marisue Brooklyn, MD as Referring Physician (Pulmonary Disease) Nelson Chimes, MD as Consulting Physician (Ophthalmology) Clarise Cruz, MD  (General Practice) Izell Hawthorne, MD as Referring Physician (Endocrinology) Ihor Gully, MD (Inactive) as Consulting Physician (Urology) Dahlia Byes, [facility] (Inactive) as Pharmacist (Pharmacist)  Indicate any recent Medical Services you may have received from other than [facility] providers in the past year (date may be approximate).     Assessment:   This is a routine wellness examination for [place].  Hearing/Vision screen Hearing Screening - Comments:: Bilateral hearing aids Vision Screening - Comments:: Up to date [place]  Dietary issues and exercise activities discussed:     Goals Addressed   None    Depression Screen    11/16/2022    9:44 AM 10/02/2022    1:46 PM 08/22/2022   10:04 AM 01/31/2022   11:16 AM 01/11/2022   11:06 AM 11/03/2021    9:19 AM 09/05/2021    3:24 PM  PHQ 2/9 Scores  PHQ - 2 Score 0 0 0 0 0 0 0  PHQ- 9 Score 0 0 0 0 0  0    Fall Risk    11/16/2022    9:47 AM 10/02/2022    1:46 PM 08/22/2022   10:04 AM 01/31/2022   11:16 AM 01/11/2022   11:06 AM  Fall Risk   Falls in the past year? 0 0 0 0 0  Number falls in past yr: 0 0 0 0 0  Injury with Fall? 0 0 0 0 0  Risk for fall due to :  No Fall Risks No Fall Risks No Fall Risks No Fall Risks  Follow up Falls evaluation completed;Education provided;Falls prevention discussed  Falls evaluation completed Falls evaluation completed Falls evaluation completed    FALL RISK PREVENTION PERTAINING TO THE HOME:  Any stairs in or around the home? No  If so, are there any without handrails? No  Home free of loose throw rugs in walkways, pet beds, electrical cords, etc? Yes  Adequate lighting in your home to reduce risk of falls? Yes   ASSISTIVE DEVICES UTILIZED TO PREVENT FALLS:  Life alert? No  Use of a cane, walker or w/c? No  Grab bars in the bathroom? Yes  Shower chair or bench in shower? Yes  Elevated toilet seat or a handicapped toilet? Yes   TIMED UP AND GO:  Was the test performed? No .    Cognitive  Function:        11/16/2022    9:48 AM 11/03/2021    9:26 AM  6CIT Screen  What Year? 0 points 0 points  What month? 0 points 0 points  What time? 0 points 0 points  Count back from 20 0 points 0 points  Months in reverse 0 points 0 points  Repeat phrase 0 points 0 points  Total Score 0 points 0 points    Immunizations Immunization History  Administered Date(s) Administered   Covid-19, Mrna,Vaccine(Spikevax)4yrs and older 04/26/2022   Influenza Split 04/11/2011   Influenza, High Dose Seasonal PF 02/21/2019, 03/19/2020, 04/11/2021   Influenza,inj,Quad PF,6+ Mos 04/10/2013, 03/10/2014, 04/20/2015, 02/29/2016, 03/20/2017, 02/06/2018, 02/16/2022   Influenza-Unspecified 04/11/2011, 04/10/2013, 03/10/2014, 04/20/2015, 02/29/2016, 03/20/2017, 03/19/2020, 04/11/2021   Moderna Covid-19 Vaccine Bivalent Booster 52yrs & up 03/14/2021   Moderna Sars-Covid-2 Vaccination 07/29/2019, 08/12/2019, 11/02/2021   PFIZER(Purple Top)SARS-COV-2 Vaccination 08/23/2021   Pneumococcal Conjugate-13 08/27/2014   Pneumococcal Polysaccharide-23 04/19/2010   Tdap 04/20/2012   Zoster Recombinat (Shingrix) 04/15/2018, 07/15/2018  TDAP status: Up to date  Flu Vaccine status: Up to date  Pneumococcal vaccine status: Up to date  Covid-19 vaccine status: Completed vaccines  Qualifies for Shingles Vaccine? No   Zostavax completed Yes   Shingrix Completed?: Yes  Screening Tests Health Maintenance  Topic Date Due   INFLUENZA VACCINE  01/18/2023   HEMOGLOBIN A1C  02/22/2023   MAMMOGRAM  07/26/2023   Diabetic kidney evaluation - eGFR measurement  08/22/2023   Diabetic kidney evaluation - Urine ACR  08/22/2023   FOOT EXAM  08/22/2023   OPHTHALMOLOGY EXAM  10/26/2023   Medicare Annual Wellness (AWV)  11/16/2023   Pneumonia Vaccine 46+ Years old  Completed   DEXA SCAN  Completed   Zoster Vaccines- Shingrix  Completed   HPV VACCINES  Aged Out   DTaP/Tdap/Td  Discontinued   COVID-19 Vaccine   Discontinued    Health Maintenance  There are no preventive care reminders to display for this patient.   Colorectal cancer screening: No longer required.   Mammogram status: Completed 2024. Repeat every year  Bone Density status: Completed 2024. Results reflect: Bone density results: OSTEOPOROSIS. Repeat every 2 years.  Lung Cancer Screening: (Low Dose CT Chest recommended if Age 31-80 years, 30 pack-year currently smoking OR have quit w/in 15years.) does not qualify.   Lung Cancer Screening Referral:   Additional Screening:  Hepatitis C Screening: does not qualify; Completed   Vision Screening: Recommended annual ophthalmology exams for early detection of glaucoma and other disorders of the eye. Is the patient up to date with their annual eye exam?  Yes  Who is the provider or what is the name of the office in which the patient attends annual eye exams? [facility] If pt is not established with a provider, would they like to be referred to a provider to establish care? No .   Dental Screening: Recommended annual dental exams for proper oral hygiene  Community Resource Referral / Chronic Care Management: CRR required this visit?  No   CCM required this visit?  No      Plan:     I have personally reviewed and noted the following in the patient's chart:   Medical and social history Use of alcohol, tobacco or illicit drugs  Current medications and supplements including opioid prescriptions. Patient is not currently taking opioid prescriptions. Functional ability and status Nutritional status Physical activity Advanced directives List of other physicians Hospitalizations, surgeries, and ER visits in previous 12 months Vitals Screenings to include cognitive, depression, and falls Referrals and appointments  In addition, I have reviewed and discussed with patient certain preventive protocols, quality metrics, and best practice recommendations. A written personalized care  plan for preventive services as well as general preventive health recommendations were provided to patient.     Remi Haggard, LPN   [id number]   Nurse Notes:

## 2022-11-16 NOTE — Addendum Note (Signed)
Addended by: Sheliah Hatch on: 11/16/2022 03:34 PM   Modules accepted: Orders

## 2022-11-16 NOTE — Telephone Encounter (Signed)
I sent in the prescription for Ozempic 0.25mg  weekly x1 month.  At that time, we will need to increase the dose to 0.5mg  weekly.  Hopefully insurance will approve given that she has dx of DM

## 2022-11-22 NOTE — Patient Instructions (Signed)
[name] , Thank you for taking time to come for your Medicare Wellness Visit. I appreciate your ongoing commitment to your health goals. Please review the following plan we discussed and let me know if I can assist you in the future.   Screening recommendations/referrals: Colonoscopy: up to date Mammogram: up to date Bone Density: up to date Recommended yearly ophthalmology/optometry visit for glaucoma screening and checkup Recommended yearly dental visit for hygiene and checkup  Vaccinations: Influenza vaccine: up tod ate Pneumococcal vaccine: up to date Tdap vaccine: Education provided Shingles vaccine: up to date    Advanced directives: Education provided     Preventive Care 65 Years and Older, Female Preventive care refers to lifestyle choices and visits with your health care provider that can promote health and wellness. What does preventive care include? A yearly physical exam. This is also called an annual well check. Dental exams once or twice a year. Routine eye exams. Ask your health care provider how often you should have your eyes checked. Personal lifestyle choices, including: Daily care of your teeth and gums. Regular physical activity. Eating a healthy diet. Avoiding tobacco and drug use. Limiting alcohol use. Practicing safe sex. Taking low-dose aspirin every day. Taking vitamin and mineral supplements as recommended by your health care provider. What happens during an annual well check? The services and screenings done by your health care provider during your annual well check will depend on your age, overall health, lifestyle risk factors, and family history of disease. Counseling  Your health care provider may ask you questions about your: Alcohol use. Tobacco use. Drug use. Emotional well-being. Home and relationship well-being. Sexual activity. Eating habits. History of falls. Memory and ability to understand (cognition). Work and work  Astronomer. Reproductive health. Screening  You may have the following tests or measurements: Height, weight, and BMI. Blood pressure. Lipid and cholesterol levels. These may be checked every 5 years, or more frequently if you are over 84 years old. Skin check. Lung cancer screening. You may have this screening every year starting at age 43 if you have a 30-pack-year history of smoking and currently smoke or have quit within the past 15 years. Fecal occult blood test (FOBT) of the stool. You may have this test every year starting at age 109. Flexible sigmoidoscopy or colonoscopy. You may have a sigmoidoscopy every 5 years or a colonoscopy every 10 years starting at age 44. Hepatitis C blood test. Hepatitis B blood test. Sexually transmitted disease (STD) testing. Diabetes screening. This is done by checking your blood sugar (glucose) after you have not eaten for a while (fasting). You may have this done every 1-3 years. Bone density scan. This is done to screen for osteoporosis. You may have this done starting at age 56. Mammogram. This may be done every 1-2 years. Talk to your health care provider about how often you should have regular mammograms. Talk with your health care provider about your test results, treatment options, and if necessary, the need for more tests. Vaccines  Your health care provider may recommend certain vaccines, such as: Influenza vaccine. This is recommended every year. Tetanus, diphtheria, and acellular pertussis (Tdap, Td) vaccine. You may need a Td booster every 10 years. Zoster vaccine. You may need this after age 31. Pneumococcal 13-valent conjugate (PCV13) vaccine. One dose is recommended after age 78. Pneumococcal polysaccharide (PPSV23) vaccine. One dose is recommended after age 42. Talk to your health care provider about which screenings and vaccines you need and how  often you need them. This information is not intended to replace advice given to you by  your health care provider. Make sure you discuss any questions you have with your health care provider. Document Released: 07/02/2015 Document Revised: 02/23/2016 Document Reviewed: 04/06/2015 Elsevier Interactive Patient Education  2017 [employer].  Fall Prevention in the Home Falls can cause injuries. They can happen to people of all ages. There are many things you can do to make your home safe and to help prevent falls. What can I do on the outside of my home? Regularly fix the edges of walkways and driveways and fix any cracks. Remove anything that might make you trip as you walk through a door, such as a raised step or threshold. Trim any bushes or trees on the path to your home. Use bright outdoor lighting. Clear any walking paths of anything that might make someone trip, such as rocks or tools. Regularly check to see if handrails are loose or broken. Make sure that both sides of any steps have handrails. Any raised decks and porches should have guardrails on the edges. Have any leaves, snow, or ice cleared regularly. Use sand or salt on walking paths during winter. Clean up any spills in your garage right away. This includes oil or grease spills. What can I do in the bathroom? Use night lights. Install grab bars by the toilet and in the tub and shower. Do not use towel bars as grab bars. Use non-skid mats or decals in the tub or shower. If you need to sit down in the shower, use a plastic, non-slip stool. Keep the floor dry. Clean up any water that spills on the floor as soon as it happens. Remove soap buildup in the tub or shower regularly. Attach bath mats securely with double-sided non-slip rug tape. Do not have throw rugs and other things on the floor that can make you trip. What can I do in the bedroom? Use night lights. Make sure that you have a light by your bed that is easy to reach. Do not use any sheets or blankets that are too big for your bed. They should not hang  down onto the floor. Have a firm chair that has side arms. You can use this for support while you get dressed. Do not have throw rugs and other things on the floor that can make you trip. What can I do in the kitchen? Clean up any spills right away. Avoid walking on wet floors. Keep items that you use a lot in easy-to-reach places. If you need to reach something above you, use a strong step stool that has a grab bar. Keep electrical cords out of the way. Do not use floor polish or wax that makes floors slippery. If you must use wax, use non-skid floor wax. Do not have throw rugs and other things on the floor that can make you trip. What can I do with my stairs? Do not leave any items on the stairs. Make sure that there are handrails on both sides of the stairs and use them. Fix handrails that are broken or loose. Make sure that handrails are as long as the stairways. Check any carpeting to make sure that it is firmly attached to the stairs. Fix any carpet that is loose or worn. Avoid having throw rugs at the top or bottom of the stairs. If you do have throw rugs, attach them to the floor with carpet tape. Make sure that you have a  light switch at the top of the stairs and the bottom of the stairs. If you do not have them, ask someone to add them for you. What else can I do to help prevent falls? Wear shoes that: Do not have high heels. Have rubber bottoms. Are comfortable and fit you well. Are closed at the toe. Do not wear sandals. If you use a stepladder: Make sure that it is fully opened. Do not climb a closed stepladder. Make sure that both sides of the stepladder are locked into place. Ask someone to hold it for you, if possible. Clearly mark and make sure that you can see: Any grab bars or handrails. First and last steps. Where the edge of each step is. Use tools that help you move around (mobility aids) if they are needed. These  include: Canes. Walkers. Scooters. Crutches. Turn on the lights when you go into a dark area. Replace any light bulbs as soon as they burn out. Set up your furniture so you have a clear path. Avoid moving your furniture around. If any of your floors are uneven, fix them. If there are any pets around you, be aware of where they are. Review your medicines with your doctor. Some medicines can make you feel dizzy. This can increase your chance of falling. Ask your doctor what other things that you can do to help prevent falls. This information is not intended to replace advice given to you by your health care provider. Make sure you discuss any questions you have with your health care provider. Document Released: 04/01/2009 Document Revised: 11/11/2015 Document Reviewed: 07/10/2014 Elsevier Interactive Patient Education  2017 [employer].

## 2022-11-30 DIAGNOSIS — M25562 Pain in left knee: Secondary | ICD-10-CM | POA: Diagnosis not present

## 2022-11-30 DIAGNOSIS — M1180 Other specified crystal arthropathies, unspecified site: Secondary | ICD-10-CM | POA: Diagnosis not present

## 2022-12-22 ENCOUNTER — Other Ambulatory Visit: Payer: Self-pay

## 2022-12-22 ENCOUNTER — Telehealth: Payer: Self-pay | Admitting: Family Medicine

## 2022-12-22 MED ORDER — OZEMPIC (0.25 OR 0.5 MG/DOSE) 2 MG/3ML ~~LOC~~ SOPN: 0.2500 mg | PEN_INJECTOR | SUBCUTANEOUS | 2 refills | Status: DC

## 2022-12-22 NOTE — Telephone Encounter (Signed)
Refill sent to pharmacy.   

## 2022-12-22 NOTE — Telephone Encounter (Signed)
Encourage patient to contact the pharmacy for refills or they can request refills through [facility]  WHAT PHARMACY WOULD THEY LIKE THIS SENT TO:  [facility] - [place], [place] - [address]    MEDICATION NAME & DOSE: Semaglutide,0.25 or 0.5MG /DOS, (OZEMPIC, 0.25 OR 0.5 MG/DOSE,) 2 MG/3ML SOPN   NOTES/COMMENTS FROM PATIENT: Pt would like 2 more full months of Ozempic 1.5mg ???       Front office please notify patient: It takes 48-72 hours to process rx refill requests Ask patient to call pharmacy to ensure rx is ready before heading there.

## 2022-12-27 ENCOUNTER — Other Ambulatory Visit: Payer: Self-pay | Admitting: Family Medicine

## 2023-01-09 DIAGNOSIS — D225 Melanocytic nevi of trunk: Secondary | ICD-10-CM | POA: Diagnosis not present

## 2023-01-09 DIAGNOSIS — L821 Other seborrheic keratosis: Secondary | ICD-10-CM | POA: Diagnosis not present

## 2023-01-09 DIAGNOSIS — L814 Other melanin hyperpigmentation: Secondary | ICD-10-CM | POA: Diagnosis not present

## 2023-01-15 ENCOUNTER — Telehealth: Payer: Self-pay | Admitting: Family Medicine

## 2023-01-15 NOTE — Telephone Encounter (Signed)
I spoke to the pt and she states her reflux is back and the diarrhea has become worse She states GI was no help .  States they gave her a medicine called Budesonide 5 mg and she is just about out of the medicine . She is asking if we can refill it ? She does not want to go to back to GI .  She is not taking anything for the reflux . Should we schedule pt an apt ?

## 2023-01-15 NOTE — Telephone Encounter (Signed)
Caller name: TIFANNY CHACON  On DPR?: Yes  Call back number: [phone] (home)  Provider they see: Sheliah Hatch, MD  Reason for call:   Pt reflux is back.Diarrhea is here. She went to the Gastroenterologist didn't help at all no matter how bland the diet not helping.

## 2023-01-16 ENCOUNTER — Other Ambulatory Visit: Payer: Self-pay

## 2023-01-16 DIAGNOSIS — K219 Gastro-esophageal reflux disease without esophagitis: Secondary | ICD-10-CM

## 2023-01-16 MED ORDER — BUDESONIDE 3 MG PO CPEP: 3.0000 mg | ORAL_CAPSULE | Freq: Every day | ORAL | 1 refills | Status: DC

## 2023-01-16 MED ORDER — PANTOPRAZOLE SODIUM 40 MG PO TBEC: 40.0000 mg | DELAYED_RELEASE_TABLET | Freq: Every day | ORAL | 1 refills | Status: DC

## 2023-01-16 NOTE — Telephone Encounter (Signed)
Ok to refill budesonide as it is written.  Can also send in Protonix 40mg  daily, #90, 1 refill for reflux.  If no improvement, will need appt

## 2023-01-16 NOTE — Telephone Encounter (Signed)
I have informed pt that the Rx has been sent in and the protonix has been sent in . I advised the pt that if this does not help she will need an apt . Pt expressed verbal understanding

## 2023-01-31 ENCOUNTER — Telehealth: Payer: Self-pay | Admitting: Family Medicine

## 2023-01-31 NOTE — Telephone Encounter (Signed)
Caller name: LAURNA PLAZA  On DPR?: Yes  Call back number: [phone] (home)  Provider they see: Sheliah Hatch, MD  Reason for call:  Still not feeling good feeling tired. Diarrhea still going on food going thru her.

## 2023-02-02 ENCOUNTER — Telehealth: Payer: Self-pay | Admitting: Family Medicine

## 2023-02-02 ENCOUNTER — Encounter: Payer: Self-pay | Admitting: Family Medicine

## 2023-02-02 ENCOUNTER — Ambulatory Visit (INDEPENDENT_AMBULATORY_CARE_PROVIDER_SITE_OTHER): Payer: Medicare Other | Admitting: Family Medicine

## 2023-02-02 VITALS — BP 116/64 | HR 77 | Temp 98.9°F | Resp 17 | Ht 63.0 in | Wt 151.5 lb

## 2023-02-02 DIAGNOSIS — H00012 Hordeolum externum right lower eyelid: Secondary | ICD-10-CM | POA: Diagnosis not present

## 2023-02-02 DIAGNOSIS — K529 Noninfective gastroenteritis and colitis, unspecified: Secondary | ICD-10-CM

## 2023-02-02 DIAGNOSIS — R5383 Other fatigue: Secondary | ICD-10-CM | POA: Diagnosis not present

## 2023-02-02 LAB — CBC WITH DIFFERENTIAL/PLATELET
Basophils Absolute: 0.1 10*3/uL (ref 0.0–0.1)
Basophils Relative: 0.9 % (ref 0.0–3.0)
Eosinophils Absolute: 0.2 10*3/uL (ref 0.0–0.7)
Eosinophils Relative: 2.8 % (ref 0.0–5.0)
HCT: 37.2 % (ref 36.0–46.0)
Hemoglobin: 12.3 g/dL (ref 12.0–15.0)
Lymphocytes Relative: 16.8 % (ref 12.0–46.0)
Lymphs Abs: 1.4 10*3/uL (ref 0.7–4.0)
MCHC: 33 g/dL (ref 30.0–36.0)
MCV: 90.7 fl (ref 78.0–100.0)
Monocytes Absolute: 0.4 10*3/uL (ref 0.1–1.0)
Monocytes Relative: 5.3 % (ref 3.0–12.0)
Neutro Abs: 6.1 10*3/uL (ref 1.4–7.7)
Neutrophils Relative %: 74.2 % (ref 43.0–77.0)
Platelets: 278 10*3/uL (ref 150.0–400.0)
RBC: 4.1 Mil/uL (ref 3.87–5.11)
RDW: 13.4 % (ref 11.5–15.5)
WBC: 8.2 10*3/uL (ref 4.0–10.5)

## 2023-02-02 LAB — B12 AND FOLATE PANEL
Folate: 20.5 ng/mL (ref >5.9)
Vitamin B-12: 354 pg/mL (ref 211–911)

## 2023-02-02 LAB — HEPATIC FUNCTION PANEL
ALT: 12 U/L (ref 0–35)
AST: 19 U/L (ref 0–37)
Albumin: 4.4 g/dL (ref 3.5–5.2)
Alkaline Phosphatase: 27 U/L — ABNORMAL LOW (ref 39–117)
Bilirubin, Direct: 0.2 mg/dL (ref 0.0–0.3)
Total Bilirubin: 0.4 mg/dL (ref 0.2–1.2)
Total Protein: 6.8 g/dL (ref 6.0–8.3)

## 2023-02-02 LAB — AMYLASE: Amylase: 46 U/L (ref 27–131)

## 2023-02-02 LAB — TSH: TSH: 1.3 u[IU]/mL (ref 0.35–5.50)

## 2023-02-02 LAB — LIPASE: Lipase: 54 U/L (ref 11.0–59.0)

## 2023-02-02 MED ORDER — DICYCLOMINE HCL 20 MG PO TABS: 20.0000 mg | ORAL_TABLET | Freq: Three times a day (TID) | ORAL | 3 refills | Status: DC

## 2023-02-02 NOTE — Telephone Encounter (Signed)
Caller name: DYMONIQUE GILLEN  On DPR?: Yes  Call back number: [phone] (mobile)  Provider they see: Sheliah Hatch, MD  Reason for call:  Pt states the referral she received to Dr. Loreta Ave is not accepting Medicare pts at this time so she needs a referral to another office

## 2023-02-02 NOTE — Telephone Encounter (Signed)
Reordered with comment about [facility] as secondary option

## 2023-02-02 NOTE — Patient Instructions (Signed)
Follow up as needed or as scheduled We'll notify you of your lab results and make any changes if needed START the Dicyclomine w/ each meal Continue to drink LOTS of water We'll call you to get that 2nd opinion on the diarrhea Hot compresses to the stye to help it heal Call with any questions or concerns Hang in there!!!

## 2023-02-02 NOTE — Progress Notes (Unsigned)
   Subjective:    Patient ID: [name], female    DOB: [birth year], 82 y.o.   MRN: [id number]  HPI Fatigue- pt is worried about dehydration due to her ongoing diarrhea.  'i have to push myself to do anything'.  Increased sleep.  Sleeping well at night w/ exception of getting up to use the bathroom.  No SOB, CP, HA's, fevers.  Diarrhea- this is ongoing for 'years'.  Will typically have 3 episodes of diarrhea in the morning and then things improve as the day goes on.  After she eats again, will have repeat episodes.  She reports ~5 watery stools daily.  Saw GI last year for similar sxs.  'they have suggested all kinds of things but nothing works'.    Skin lesion- just under R eye, first appeared ~1 week ago.  Not painful.  She feels it is slightly enlarging.   Review of Systems For ROS see HPI     Objective:   Physical Exam Vitals reviewed.  Constitutional:      General: She is not in acute distress.    Appearance: Normal appearance. She is not ill-appearing.  HENT:     Head: Normocephalic and atraumatic.  Eyes:     Extraocular Movements: Extraocular movements intact.     Conjunctiva/sclera: Conjunctivae normal.     Pupils: Pupils are equal, round, and reactive to light.     Comments: 0.5cm stye on R lower lid  Cardiovascular:     Rate and Rhythm: Normal rate and regular rhythm.  Pulmonary:     Effort: Pulmonary effort is normal. No respiratory distress.     Breath sounds: No wheezing or rhonchi.  Abdominal:     General: There is no distension.     Palpations: Abdomen is soft.     Tenderness: There is no abdominal tenderness. There is no guarding or rebound.  Skin:    General: Skin is warm and dry.  Neurological:     General: No focal deficit present.     Mental Status: She is alert and oriented to person, place, and time.  Psychiatric:        Mood and Affect: Mood normal.        Behavior: Behavior normal.        Thought Content: Thought content normal.            Assessment & Plan:   Fatigue- new.  Pt reports having no energy and really having to push herself to do anything.  Taking naps during the day which is unusual for her.  Feels that her diarrhea is contributing to fatigue and worried about dehydration or other metabolic abnormalities.  Check labs and treat any underlying causes.  Stye- new.  Reviewed dx w/ pt and encouraged hot compresses to improve sxs.  Pt expressed understanding and is in agreement w/ plan.

## 2023-02-05 ENCOUNTER — Telehealth: Payer: Self-pay | Admitting: Family Medicine

## 2023-02-05 ENCOUNTER — Other Ambulatory Visit (HOSPITAL_COMMUNITY): Payer: Self-pay

## 2023-02-05 ENCOUNTER — Telehealth: Payer: Self-pay

## 2023-02-05 ENCOUNTER — Telehealth: Payer: Self-pay | Admitting: Pharmacy Technician

## 2023-02-05 ENCOUNTER — Other Ambulatory Visit: Payer: Self-pay

## 2023-02-05 DIAGNOSIS — K219 Gastro-esophageal reflux disease without esophagitis: Secondary | ICD-10-CM

## 2023-02-05 NOTE — Telephone Encounter (Signed)
How would you like to proceed ? Dr Kenna Gilbert office states they do not do second opinions

## 2023-02-05 NOTE — Telephone Encounter (Signed)
Referral placed.

## 2023-02-05 NOTE — Telephone Encounter (Signed)
Pharmacy Patient Advocate Encounter   Received notification from CoverMyMeds that prior authorization for Dicyclomine HCl 20MG  tablets is required/requested.   Insurance verification completed.   The patient is insured through  [employer]  .   Per test claim: PA required; PA submitted to [employer] via CoverMyMeds Key/confirmation #/EOC [id number] Status is pending

## 2023-02-05 NOTE — Telephone Encounter (Signed)
Then please send to [facility] per the referral notes.  Thank you

## 2023-02-05 NOTE — Telephone Encounter (Signed)
-----   Message from Neena Rhymes sent at 02/05/2023  7:45 AM EDT ----- Labs all look good!  No obvious cause for fatigue.  This is great news!

## 2023-02-05 NOTE — Telephone Encounter (Signed)
Error

## 2023-02-05 NOTE — Telephone Encounter (Signed)
Left results on pt VM  

## 2023-02-05 NOTE — Telephone Encounter (Signed)
Dr Kenna Gilbert office called stating they don't give second opinions for patients.

## 2023-02-05 NOTE — Telephone Encounter (Signed)
Pharmacy Patient Advocate Encounter  Received notification from  [facility] medicare   that Prior Authorization for Dicyclomine HCl 20MG  tablets has been APPROVED from 02/05/23 to 02/05/24. Ran test claim, Copay is $5.35. This test claim was processed through [facility]- copay amounts may vary at other pharmacies due to pharmacy/plan contracts, or as the patient moves through the different stages of their insurance plan.   PA #/Case ID/Reference #:  [id number]

## 2023-02-06 NOTE — Assessment & Plan Note (Signed)
Ongoing issue for pt.  She has not found any relief over the years and has seen GI multiple times.  She is having ~5 watery stools/day- typically after eating.  According to my records, she has not been on Bentyl in the past.  Will try to see if things brings relief.  Will refer to new GI for 2nd opinion.  Pt expressed understanding and is in agreement w/ plan.

## 2023-02-19 ENCOUNTER — Other Ambulatory Visit: Payer: Self-pay | Admitting: Family Medicine

## 2023-02-20 ENCOUNTER — Telehealth: Payer: Self-pay | Admitting: Family Medicine

## 2023-02-20 MED ORDER — OMEPRAZOLE 40 MG PO CPDR: 40.0000 mg | DELAYED_RELEASE_CAPSULE | Freq: Every day | ORAL | 2 refills | Status: DC

## 2023-02-20 NOTE — Telephone Encounter (Signed)
I have pt aware of the change

## 2023-02-20 NOTE — Telephone Encounter (Signed)
Sent to Dr.Tabori Please advise

## 2023-02-20 NOTE — Telephone Encounter (Signed)
I'm so glad that the diarrhea is improving.  I sent in Omeprazole instead of her Pantoprazole to see if that helps.

## 2023-02-20 NOTE — Telephone Encounter (Signed)
Caller name: MAXIME NEEF  On DPR?: Yes  Call back number: [phone] (mobile)  Provider they see: Sheliah Hatch, MD  Reason for call:  Pt states that Dicyclomine is working wonderfully and has helped with her diarrhea. However her Pantoprazole is not working as good and she is waking up with a sore throat and hoarseness. She asked if there was another medication she could try. She mentioned taking a similar medication about 3-4 years ago but cannot remember the name of it.   [facility] - [place], [place] - [address]

## 2023-02-22 DIAGNOSIS — H401131 Primary open-angle glaucoma, bilateral, mild stage: Secondary | ICD-10-CM | POA: Diagnosis not present

## 2023-02-22 DIAGNOSIS — Z961 Presence of intraocular lens: Secondary | ICD-10-CM | POA: Diagnosis not present

## 2023-02-22 DIAGNOSIS — H5319 Other subjective visual disturbances: Secondary | ICD-10-CM | POA: Diagnosis not present

## 2023-02-27 ENCOUNTER — Telehealth: Payer: Self-pay | Admitting: Family Medicine

## 2023-02-27 NOTE — Telephone Encounter (Signed)
Caller name: ELLAKATE BAYLIFF  On DPR?: Yes  Call back number: [phone] (mobile)  Provider they see: Sheliah Hatch, MD  Reason for call:  Pt has called praising Dr. Beverely Low for the medicine that she put pt on. It has changed her life to where she can finally travel and enjoy life again. After 5 years of diarrhea her bowels are almost normal again. She can't say thank you enough.

## 2023-03-07 DIAGNOSIS — Z23 Encounter for immunization: Secondary | ICD-10-CM | POA: Diagnosis not present

## 2023-03-12 ENCOUNTER — Encounter: Payer: Self-pay | Admitting: Family Medicine

## 2023-03-12 ENCOUNTER — Ambulatory Visit (INDEPENDENT_AMBULATORY_CARE_PROVIDER_SITE_OTHER): Payer: Medicare Other | Admitting: Family Medicine

## 2023-03-12 VITALS — BP 144/66 | HR 72 | Temp 97.8°F | Wt 151.0 lb

## 2023-03-12 DIAGNOSIS — R6889 Other general symptoms and signs: Secondary | ICD-10-CM | POA: Diagnosis not present

## 2023-03-12 DIAGNOSIS — R059 Cough, unspecified: Secondary | ICD-10-CM | POA: Diagnosis not present

## 2023-03-12 MED ORDER — ALBUTEROL SULFATE HFA 108 (90 BASE) MCG/ACT IN AERS: 2.0000 | INHALATION_SPRAY | Freq: Four times a day (QID) | RESPIRATORY_TRACT | 3 refills | Status: DC | PRN

## 2023-03-12 MED ORDER — AMOXICILLIN 875 MG PO TABS: 875.0000 mg | ORAL_TABLET | Freq: Two times a day (BID) | ORAL | 0 refills | Status: AC

## 2023-03-12 MED ORDER — GUAIFENESIN-CODEINE 100-10 MG/5ML PO SYRP: 10.0000 mL | ORAL_SOLUTION | Freq: Three times a day (TID) | ORAL | 0 refills | Status: DC | PRN

## 2023-03-12 NOTE — Progress Notes (Unsigned)
Subjective:    Patient ID: [name], female    DOB: [birth year], 82 y.o.   MRN: [id number]  HPI URI- pt woke yesterday feeling ill.  Started w/ sore throat.  Denies headache.  Denies fever.  + body aches- back in particular.  + productive cough.  + fatigue.  Denies sinus pain/pressure.  No known sick contacts.  Pt is quite active and around lots of people.  Has big trip upcoming.   Review of Systems For ROS see HPI     Objective:   Physical Exam Vitals reviewed.  Constitutional:      General: She is not in acute distress.    Appearance: Normal appearance. She is not ill-appearing.  HENT:     Head: Normocephalic and atraumatic.     Right Ear: Tympanic membrane and ear canal normal.     Left Ear: Tympanic membrane and ear canal normal.     Nose: No congestion.     Comments: No TTP over sinuses    Mouth/Throat:     Mouth: Mucous membranes are moist.     Pharynx: No oropharyngeal exudate or posterior oropharyngeal erythema.  Eyes:     General:        Right eye: No discharge.        Left eye: No discharge.     Conjunctiva/sclera: Conjunctivae normal.  Cardiovascular:     Rate and Rhythm: Normal rate and regular rhythm.  Pulmonary:     Effort: Pulmonary effort is normal. No respiratory distress.     Breath sounds: No wheezing or rhonchi.     Comments: + wet, hacking cough Musculoskeletal:     Cervical back: Neck supple.  Lymphadenopathy:     Cervical: No cervical adenopathy.  Skin:    General: Skin is warm and dry.     Coloration: Skin is not pale.  Neurological:     General: No focal deficit present.     Mental Status: She is alert and oriented to person, place, and time.  Psychiatric:        Mood and Affect: Mood normal.        Behavior: Behavior normal.        Thought Content: Thought content normal.           Assessment & Plan:  Flu like sxs- new.  Thankfully COVID and flu are negative.  Suspect viral illness but as pt is leaving Saturday for an extended  trip, will provide abx to start late this week if sxs don't improve.  Treat w/ cough syrup and Albuterol prn.  Reviewed supportive care and red flags that should prompt return.  Pt expressed understanding and is in agreement w/ plan.

## 2023-03-12 NOTE — Patient Instructions (Signed)
Follow up as needed or as scheduled THANKFULLY this is not COVID or flu Drink LOTS of fluids REST! Use the cough syrup as needed- may cause drowsiness Use the Albuterol inhaler as needed for cough, wheezing, or shortness of breath If not feeling better- or getting worse- in the next few days, start the Amoxicillin to cover any possible bacterial infection (especially since you will be traveling) Call with any questions or concerns Hang in there! Safe Travels!

## 2023-03-13 ENCOUNTER — Telehealth: Payer: Self-pay | Admitting: Family Medicine

## 2023-03-13 NOTE — Telephone Encounter (Signed)
Patient was seen 03/12/23

## 2023-03-13 NOTE — Telephone Encounter (Signed)
Patient Name First: Karen Last: [place] Griffin Gender: Female DOB: 1941/06/04 Age: 82 Y 4 M 19 D Return Phone Number: [phone] (Primary), [phone] (Secondary) Address: City/ State/ Zip: [place] [address] Client [facility] [place] [facility] - C Client Site [facility] - Night Provider Lezlie Octave- MD Contact Type Call Who Is Calling Patient / Member / Family / Caregiver Call Type Triage / Clinical Relationship To Patient Self Return Phone Number [phone] (Primary) Chief Complaint CHEST PAIN - pain, pressure, heaviness or tightness Reason for Call Request to Schedule Office Appointment Initial Comment Caller states that she is coming down with something. She is having chest tightness and she has asthma. She believes she needs to be seen by her MD today. Translation No Nurse Assessment Nurse: Darlin Coco, RN, Charyl Dancer Date/Time (Eastern Time): 03/12/2023 7:18:57 AM Confirm and document reason for call. If symptomatic, describe symptoms. ---Caller states she feels she is 'coming down w/ something', has been having some chest tightness (still now, but less than other times), eyes are 'weak'/ watery, sleeping more, congestion, chills/feels hot, & coughing; first noted Saturday. Denies known fever (not checked yet) or any other symptoms. Does the patient have any new or worsening symptoms? ---Yes Will a triage be completed? ---Yes Related visit to physician within the last 2 weeks? ---N/A Does the PT have any chronic conditions? (i.e. diabetes, asthma, this includes High risk factors for pregnancy, etc.) ---Unknown Is this a behavioral health or substance abuse call? ---No Guidelines Guideline Title Affirmed Question Affirmed Notes Nurse Date/Time (Eastern Time) COVID-19 - Diagnosed or Suspected SEVERE or constant chest pain or pressure (Exception: Mild Cazares, RN, Charyl Dancer 03/12/2023  7:28:06 AM PLEASE NOTE: All timestamps contained within this report are represented as [place] Standard Time. CONFIDENTIALTY NOTICE: This fax transmission is intended only for the addressee. It contains information that is legally privileged, confidential or otherwise protected from use or disclosure. If you are not the intended recipient, you are strictly prohibited from reviewing, disclosing, copying using or disseminating any of this information or taking any action in reliance on or regarding this information. If you have received this fax in error, please notify us immediately by telephone so that we can arrange for its return to [place]. Phone: [phone], Toll-Free: [phone], Fax: [phone] Page: 2 of 2 Call Id: [id number]  Pt came to the office 03/12/23

## 2023-03-14 LAB — POC COVID19 BINAXNOW: SARS Coronavirus 2 Ag: NEGATIVE

## 2023-03-14 LAB — POCT INFLUENZA A/B
Influenza A, POC: NEGATIVE
Influenza B, POC: NEGATIVE

## 2023-03-23 ENCOUNTER — Telehealth: Payer: Self-pay | Admitting: Family Medicine

## 2023-03-23 NOTE — Telephone Encounter (Signed)
Error

## 2023-03-27 ENCOUNTER — Encounter: Payer: Self-pay | Admitting: Family Medicine

## 2023-03-27 ENCOUNTER — Ambulatory Visit (INDEPENDENT_AMBULATORY_CARE_PROVIDER_SITE_OTHER): Payer: Medicare Other | Admitting: Family Medicine

## 2023-03-27 VITALS — BP 122/64 | HR 75 | Temp 97.6°F | Ht 63.0 in | Wt 149.4 lb

## 2023-03-27 DIAGNOSIS — L270 Generalized skin eruption due to drugs and medicaments taken internally: Secondary | ICD-10-CM

## 2023-03-27 MED ORDER — TRIAMCINOLONE ACETONIDE 0.1 % EX OINT: 1.0000 | TOPICAL_OINTMENT | Freq: Two times a day (BID) | CUTANEOUS | 1 refills | Status: AC

## 2023-03-27 NOTE — Progress Notes (Signed)
   Subjective:    Patient ID: [name], female    DOB: 05/15/1941, 82 y.o.   MRN: [id number]  HPI Rash- pt notes that itchy red rash appeared on her chest while taking Amoxicillin.  Described as blister like w/ some pus and drainage.  Area is improving since stopping medication.  Remains itchy at times.  Back has been itchy for much longer- no lesions but area is red.   Review of Systems For ROS see HPI     Objective:   Physical Exam Vitals reviewed.  Constitutional:      General: She is not in acute distress.    Appearance: Normal appearance. She is not ill-appearing.  HENT:     Head: Normocephalic and atraumatic.  Skin:    General: Skin is warm and dry.     Findings: Erythema (redness over most of back w/o lesions or rash) and rash (maculopapular rash on anterior chest) present.  Neurological:     Mental Status: She is alert.           Assessment & Plan:   Drug rash- new.  Sxs started while pt was on Amoxicillin.  Reports it was very itchy.  Since completing abx, rash has lessened, vesicles have cleared.  Will start Triamcinolone ointment prn.  Redness on back is due to dry skin- encouraged pt to switch body wash.  Pt expressed understanding and is in agreement w/ plan.

## 2023-03-27 NOTE — Patient Instructions (Addendum)
Follow up in 1 month to recheck sugars Apply Triamcinolone ointment twice daily on itchy/rash areas SWITCH to a moisturizing body wash like Dove or Aveeno for the back Try and avoid very hot showers as this will dry out your skin Call with any questions or concerns Stay Safe!  Stay Healthy! Hang in there!!!

## 2023-03-29 DIAGNOSIS — H401131 Primary open-angle glaucoma, bilateral, mild stage: Secondary | ICD-10-CM | POA: Diagnosis not present

## 2023-03-29 DIAGNOSIS — J069 Acute upper respiratory infection, unspecified: Secondary | ICD-10-CM | POA: Diagnosis not present

## 2023-03-29 DIAGNOSIS — H00012 Hordeolum externum right lower eyelid: Secondary | ICD-10-CM | POA: Diagnosis not present

## 2023-03-30 DIAGNOSIS — J069 Acute upper respiratory infection, unspecified: Secondary | ICD-10-CM | POA: Diagnosis not present

## 2023-04-12 ENCOUNTER — Other Ambulatory Visit: Payer: Self-pay | Admitting: Family Medicine

## 2023-04-18 DIAGNOSIS — M1180 Other specified crystal arthropathies, unspecified site: Secondary | ICD-10-CM | POA: Diagnosis not present

## 2023-04-18 DIAGNOSIS — M25562 Pain in left knee: Secondary | ICD-10-CM | POA: Diagnosis not present

## 2023-04-20 DIAGNOSIS — J42 Unspecified chronic bronchitis: Secondary | ICD-10-CM | POA: Diagnosis not present

## 2023-04-20 DIAGNOSIS — J4540 Moderate persistent asthma, uncomplicated: Secondary | ICD-10-CM | POA: Diagnosis not present

## 2023-04-27 ENCOUNTER — Other Ambulatory Visit: Payer: Self-pay | Admitting: Family Medicine

## 2023-04-27 NOTE — Telephone Encounter (Signed)
Requested Prescriptions   Pending Prescriptions Disp Refills   OZEMPIC, 0.25 OR 0.5 MG/DOSE, 2 MG/3ML SOPN [Pharmacy Med Name: OZEMPIC INJ 0.25MG  / 0.5MG ] 3 mL 2    Sig: INJECT 0.25 MILLIGRAMS INTO THE SKIN ONCE WEEKLY     Date of patient request: 04/27/2023 03/27/2023 05/01/2023 Date of last refill: 12/22/22 Last refill amount: 3 mL

## 2023-04-30 ENCOUNTER — Other Ambulatory Visit: Payer: Self-pay | Admitting: Family Medicine

## 2023-05-01 ENCOUNTER — Ambulatory Visit (HOSPITAL_BASED_OUTPATIENT_CLINIC_OR_DEPARTMENT_OTHER)
Admission: RE | Admit: 2023-05-01 | Discharge: 2023-05-01 | Disposition: A | Discharge status: Home / Self Care | Payer: Medicare Other | Source: Ambulatory Visit | Attending: Family Medicine | Admitting: Family Medicine

## 2023-05-01 ENCOUNTER — Encounter: Payer: Self-pay | Admitting: Family Medicine

## 2023-05-01 ENCOUNTER — Ambulatory Visit (INDEPENDENT_AMBULATORY_CARE_PROVIDER_SITE_OTHER): Payer: Medicare Other | Admitting: Family Medicine

## 2023-05-01 VITALS — BP 120/52 | HR 79 | Temp 98.0°F | Ht 63.0 in | Wt 143.4 lb

## 2023-05-01 DIAGNOSIS — K529 Noninfective gastroenteritis and colitis, unspecified: Secondary | ICD-10-CM

## 2023-05-01 DIAGNOSIS — E782 Mixed hyperlipidemia: Secondary | ICD-10-CM

## 2023-05-01 DIAGNOSIS — R5381 Other malaise: Secondary | ICD-10-CM | POA: Diagnosis not present

## 2023-05-01 DIAGNOSIS — M7732 Calcaneal spur, left foot: Secondary | ICD-10-CM | POA: Diagnosis not present

## 2023-05-01 DIAGNOSIS — Z7985 Long-term (current) use of injectable non-insulin antidiabetic drugs: Secondary | ICD-10-CM | POA: Diagnosis not present

## 2023-05-01 DIAGNOSIS — E119 Type 2 diabetes mellitus without complications: Secondary | ICD-10-CM

## 2023-05-01 DIAGNOSIS — M79672 Pain in left foot: Secondary | ICD-10-CM | POA: Diagnosis not present

## 2023-05-01 DIAGNOSIS — R52 Pain, unspecified: Secondary | ICD-10-CM

## 2023-05-01 DIAGNOSIS — R5383 Other fatigue: Secondary | ICD-10-CM | POA: Diagnosis not present

## 2023-05-01 DIAGNOSIS — M7989 Other specified soft tissue disorders: Secondary | ICD-10-CM | POA: Diagnosis not present

## 2023-05-01 LAB — BASIC METABOLIC PANEL
BUN: 24 mg/dL — ABNORMAL HIGH (ref 6–23)
CO2: 22 meq/L (ref 19–32)
Calcium: 9.6 mg/dL (ref 8.4–10.5)
Chloride: 106 meq/L (ref 96–112)
Creatinine, Ser: 0.84 mg/dL (ref 0.40–1.20)
GFR: 64.21 mL/min (ref >60.00)
Glucose, Bld: 85 mg/dL (ref 70–99)
Potassium: 3.6 meq/L (ref 3.5–5.1)
Sodium: 138 meq/L (ref 135–145)

## 2023-05-01 LAB — CBC WITH DIFFERENTIAL/PLATELET
Basophils Absolute: 0.1 10*3/uL (ref 0.0–0.1)
Basophils Relative: 0.5 % (ref 0.0–3.0)
Eosinophils Absolute: 0.1 10*3/uL (ref 0.0–0.7)
Eosinophils Relative: 1.3 % (ref 0.0–5.0)
HCT: 35.5 % — ABNORMAL LOW (ref 36.0–46.0)
Hemoglobin: 12.1 g/dL (ref 12.0–15.0)
Lymphocytes Relative: 13.5 % (ref 12.0–46.0)
Lymphs Abs: 1.4 10*3/uL (ref 0.7–4.0)
MCHC: 33.9 g/dL (ref 30.0–36.0)
MCV: 90.6 fL (ref 78.0–100.0)
Monocytes Absolute: 0.5 10*3/uL (ref 0.1–1.0)
Monocytes Relative: 5.1 % (ref 3.0–12.0)
Neutro Abs: 8.4 10*3/uL — ABNORMAL HIGH (ref 1.4–7.7)
Neutrophils Relative %: 79.6 % — ABNORMAL HIGH (ref 43.0–77.0)
Platelets: 311 10*3/uL (ref 150.0–400.0)
RBC: 3.92 Mil/uL (ref 3.87–5.11)
RDW: 14.2 % (ref 11.5–15.5)
WBC: 10.5 10*3/uL (ref 4.0–10.5)

## 2023-05-01 LAB — LIPID PANEL
Cholesterol: 104 mg/dL (ref 0–200)
HDL: 45.7 mg/dL (ref >39.00)
LDL Cholesterol: 45 mg/dL (ref 0–99)
NonHDL: 58.09
Total CHOL/HDL Ratio: 2
Triglycerides: 63 mg/dL (ref 0.0–149.0)
VLDL: 12.6 mg/dL (ref 0.0–40.0)

## 2023-05-01 LAB — HEPATIC FUNCTION PANEL
ALT: 13 U/L (ref 0–35)
AST: 19 U/L (ref 0–37)
Albumin: 4.3 g/dL (ref 3.5–5.2)
Alkaline Phosphatase: 39 U/L (ref 39–117)
Bilirubin, Direct: 0.1 mg/dL (ref 0.0–0.3)
Total Bilirubin: 0.4 mg/dL (ref 0.2–1.2)
Total Protein: 6.9 g/dL (ref 6.0–8.3)

## 2023-05-01 LAB — POC INFLUENZA A&B (BINAX/QUICKVUE)
Influenza A, POC: NEGATIVE
Influenza B, POC: NEGATIVE

## 2023-05-01 LAB — POC COVID19 BINAXNOW: SARS Coronavirus 2 Ag: NEGATIVE

## 2023-05-01 LAB — HEMOGLOBIN A1C: Hgb A1c MFr Bld: 6.1 % (ref 4.6–6.5)

## 2023-05-01 LAB — TSH: TSH: 2.02 u[IU]/mL (ref 0.35–5.50)

## 2023-05-01 MED ORDER — AZITHROMYCIN 250 MG PO TABS: ORAL_TABLET | ORAL | 0 refills | Status: DC

## 2023-05-01 MED ORDER — BREZTRI AEROSPHERE 160-9-4.8 MCG/ACT IN AERO: 2.0000 | INHALATION_SPRAY | Freq: Two times a day (BID) | RESPIRATORY_TRACT | 6 refills | Status: AC

## 2023-05-01 NOTE — Assessment & Plan Note (Signed)
Chronic problem.  Currently on Ozempic 0.25mg  weekly.  Down another 6 lbs in the last month.  UTD on foot exam, eye exam, and microalbumin.  Asymptomatic.  Check labs.  Adjust meds prn

## 2023-05-01 NOTE — Patient Instructions (Signed)
Follow up in 6 months to recheck BP, cholesterol, and sugar We'll notify you of your lab results and make any changes if needed GO to the [facility] on [address] to get your xrays done [facility] call you to schedule your GI appt at [facility] START the Azithromycin as directed for the cough and wheezing Keep up the good work on healthy diet and regular exercise- you look great! Call with any questions or concerns Hang in there! Happy Thanksgiving!!!

## 2023-05-01 NOTE — Assessment & Plan Note (Signed)
Ongoing issue for pt for last 5-6 yrs.  Some improvement w/ dicyclomine but continues to have multiple watery stool daily.  Some improvement w/ gluten free diet, but again, watery stools continued.  [facility] and Dr Kenna Gilbert office have both declined to see pt due to fact she has seen local provider previously.  Will refer to [facility] for complete evaluation.

## 2023-05-01 NOTE — Assessment & Plan Note (Signed)
Chronic problem.  Currently on Lipitor and Fenofibrate daily w/o difficulty.  Check labs.  Adjust meds prn

## 2023-05-01 NOTE — Progress Notes (Signed)
Subjective:    Patient ID: [name], female    DOB: 1940/08/05, 83 y.o.   MRN: [id number]  HPI DM- chronic problem, on Ozempic 0.25mg  weekly.  Down another 6 lbs in last month.  No CP, SOB, HA's, visual changes, edema.  Denies numbness/tingling of hands/feet.  No symptomatic lows.  UTD on foot exam, eye exam,microalbumin.  Hyperlipidemia- chronic problem, on Lipitor 20mg  daily, Fenofibrate 145mg  daily.  Denies abd pain, N/V.  Diarrhea- pt reports sxs improve w/ Dicyclomine.  Pt found that sxs also improve w/ gluten free diet.  Sxs worsened when she re-introduced gluten.  Denies abd pain, belching.  'i just have continuous diarrhea'.  Has 3 'water bowel movements' in the morning and then again in the afternoon.  Sxs started 5-6 yrs ago.  Body aches/fatigue- sxs started Saturday.  + sore throat.  Pt reports she will have cough productive of brown sputum.  Mild wheeze.  Currently on [facility] w/ Pulmonary.    L foot pain- pt reports stubbing 3rd and 4th toes in late September.  Toes were initially bruised and swollen.  Now toes feel better but having pain over metatarsals.  + swelling between 1st and 2nd metatarsals  Review of Systems For ROS see HPI     Objective:   Physical Exam Vitals reviewed.  Constitutional:      General: She is not in acute distress.    Appearance: Normal appearance. She is well-developed. She is not ill-appearing.  HENT:     Head: Normocephalic and atraumatic.  Eyes:     Conjunctiva/sclera: Conjunctivae normal.     Pupils: Pupils are equal, round, and reactive to light.  Neck:     Thyroid: No thyromegaly.  Cardiovascular:     Rate and Rhythm: Normal rate and regular rhythm.     Heart sounds: Normal heart sounds. No murmur heard. Pulmonary:     Effort: Pulmonary effort is normal. No respiratory distress.     Breath sounds: Normal breath sounds. No wheezing or rhonchi.     Comments: Wet cough Abdominal:     General: There is no distension.      Palpations: Abdomen is soft.     Tenderness: There is no abdominal tenderness.  Musculoskeletal:        General: Swelling (between L distal 1st and 2nd metatarsals) and tenderness (TTP over first metatarsal and along 3rd metatarsal) present.     Cervical back: Normal range of motion and neck supple.  Lymphadenopathy:     Cervical: No cervical adenopathy.  Skin:    General: Skin is warm and dry.  Neurological:     General: No focal deficit present.     Mental Status: She is alert and oriented to person, place, and time.  Psychiatric:        Mood and Affect: Mood normal.        Behavior: Behavior normal.        Thought Content: Thought content normal.           Assessment & Plan:  Malaise and fatigue- new.  Pt reports sxs started 4 days ago.  Has wet cough productive of brown sputum.  Recently saw pulmonary and was dx'd w/ chronic bronchitis.  Was started on Breztri w/ some relief.  Given the change in sputum production and increased fatigue, will start Zpack in case of atypical mycoplasma infxn.  Pt expressed understanding and is in agreement w/ plan.   Foot pain- new.  Pt reports she stubbed  her L 3rd and 4th toes about 5 weeks ago.  Toes are no longer painful but having pain and swelling along metatarsals.  Will get xray to assess.  Pt encouraged to wear supportive shoes and ice.

## 2023-05-02 ENCOUNTER — Telehealth: Payer: Self-pay

## 2023-05-02 NOTE — Telephone Encounter (Signed)
-----   Message from Neena Rhymes sent at 05/02/2023  7:30 AM EST ----- Labs look great!  No changes at this time

## 2023-05-03 ENCOUNTER — Encounter: Payer: Self-pay | Admitting: Family Medicine

## 2023-05-03 ENCOUNTER — Ambulatory Visit (INDEPENDENT_AMBULATORY_CARE_PROVIDER_SITE_OTHER): Payer: Medicare Other | Admitting: Family Medicine

## 2023-05-03 VITALS — BP 124/58 | HR 71 | Temp 98.0°F | Ht 63.0 in

## 2023-05-03 DIAGNOSIS — R059 Cough, unspecified: Secondary | ICD-10-CM

## 2023-05-03 MED ORDER — AMOXICILLIN 875 MG PO TABS
875.0000 mg | ORAL_TABLET | Freq: Two times a day (BID) | ORAL | 0 refills | Status: AC
Start: 2023-05-03 — End: 2023-05-13

## 2023-05-03 MED ORDER — ALBUTEROL SULFATE HFA 108 (90 BASE) MCG/ACT IN AERS: 2.0000 | INHALATION_SPRAY | Freq: Four times a day (QID) | RESPIRATORY_TRACT | 3 refills | Status: AC | PRN

## 2023-05-03 NOTE — Patient Instructions (Signed)
Follow up as needed or as scheduled ADD the high dose Amoxicillin to the Azithromycin USE Delsym or Robitussin or Mucinex DM for cough relief USE the Albuterol inhaler- 2 puffs every 4-6 hrs as needed for cough or wheezing Drink LOTS of fluids REST! Call with any questions or concerns Hang in there!

## 2023-05-03 NOTE — Progress Notes (Signed)
   Subjective:    Patient ID: [name], female    DOB: [birth year], 82 y.o.   MRN: [id number]  HPI Cough- pt reports feeling worse, throat now hurts, and cough is productive of 'really nasty stuff'.  Oxygen level is normal.  No fever.  Denies sinus pain/pressure.  Currently on Azithromycin.  Denies wheezing.  Mild SOB this morning.   Review of Systems For ROS see HPI     Objective:   Physical Exam AAOx3, NAD NCAT Conjunctiva clear No TTP over sinuses Minimal nasal congestion Oropharynx WNL No cervical LAD Scattered expiratory wheezes + wet cough       Assessment & Plan:  Cough- deteriorated.  2 days ago was started on Azithromycin but feels cough is worsening.  She has hx of chronic bronchitis and saw pulmonary on 11/1- started on Breztri.  This was refilled last visit.  She is not currently using albuterol- refill sent.  Will broaden abx coverage by adding high dose Amoxicillin.  Reviewed supportive care and red flags that should prompt return.  Pt expressed understanding and is in agreement w/ plan.

## 2023-05-09 ENCOUNTER — Telehealth: Payer: Self-pay | Admitting: Family Medicine

## 2023-05-09 NOTE — Telephone Encounter (Signed)
Pt is very upset. Pt states she must be important. I assured her this is not the case and we are seeing this with a lot of imaging.  Called reading room and they state this will be read soon.

## 2023-05-09 NOTE — Telephone Encounter (Signed)
Unfortunately due to the radiology shortage, this has not been read yet.  As soon as I know anything I will let her know.  I will also pass this along to our office manager as this is the 2nd time she has called and she has asked in person

## 2023-05-09 NOTE — Telephone Encounter (Signed)
Caller name: TAIYA BOGUS  On DPR?: Yes  Call back number: [phone] (home)  Provider they see: Sheliah Hatch, MD  Reason for call:   Pt would like call about the X-ray on her foot. Is the foot broken????

## 2023-05-09 NOTE — Telephone Encounter (Signed)
Please advise 

## 2023-05-10 ENCOUNTER — Telehealth: Payer: Self-pay

## 2023-05-10 DIAGNOSIS — M25552 Pain in left hip: Secondary | ICD-10-CM | POA: Diagnosis not present

## 2023-05-10 NOTE — Telephone Encounter (Signed)
-----   Message from Neena Rhymes sent at 05/10/2023  7:27 AM EST ----- No evidence of break/fracture- this is good news!

## 2023-05-10 NOTE — Telephone Encounter (Signed)
I understand her frustration.  Thankfully they read her xray overnight and there is no break/fracture

## 2023-05-11 ENCOUNTER — Other Ambulatory Visit: Payer: Self-pay | Admitting: Family Medicine

## 2023-05-14 ENCOUNTER — Other Ambulatory Visit: Payer: Self-pay | Admitting: Family Medicine

## 2023-05-14 ENCOUNTER — Telehealth: Payer: Self-pay | Admitting: Family Medicine

## 2023-05-14 NOTE — Telephone Encounter (Signed)
Noted  

## 2023-05-14 NOTE — Telephone Encounter (Signed)
Caller name: Dr Shari Prows Dibgy   On DPR?: Yes  Call back number: [phone]  Provider they see: Sheliah Hatch, MD  Reason for call:  Dr Shari [facility] [phone]  Discharging patient because behavior just FYI.Marland Kitchen And they're referring to another office.  Beverely Low can call if further inquiry is necessary.

## 2023-05-15 NOTE — Telephone Encounter (Signed)
Patient is aware of her results.

## 2023-05-31 DIAGNOSIS — M25552 Pain in left hip: Secondary | ICD-10-CM | POA: Diagnosis not present

## 2023-06-05 DIAGNOSIS — M25562 Pain in left knee: Secondary | ICD-10-CM | POA: Diagnosis not present

## 2023-06-05 DIAGNOSIS — M1180 Other specified crystal arthropathies, unspecified site: Secondary | ICD-10-CM | POA: Diagnosis not present

## 2023-06-26 ENCOUNTER — Telehealth: Payer: Self-pay | Admitting: Family Medicine

## 2023-06-26 NOTE — Telephone Encounter (Signed)
 FYI

## 2023-06-26 NOTE — Telephone Encounter (Signed)
 Copied from CRM [id number]. Topic: General - Other >> Jun 26, 2023  9:40 AM Graeme ORN wrote: Reason for CRM: Patient called to leave message for provider. Wanted to let her know that the last medication/antibiotic she prescribed seemed to resolve her issue that she has been dealing with for 3 years. She feels so much better. She wants Dr Mahlon to know what a wonderful provider she is and thank her. Thank You

## 2023-06-28 DIAGNOSIS — M25562 Pain in left knee: Secondary | ICD-10-CM | POA: Diagnosis not present

## 2023-07-05 DIAGNOSIS — M25562 Pain in left knee: Secondary | ICD-10-CM | POA: Diagnosis not present

## 2023-07-17 ENCOUNTER — Other Ambulatory Visit: Payer: Self-pay | Admitting: Family Medicine

## 2023-07-17 NOTE — Telephone Encounter (Signed)
Does not have your name on the script but considering maintenance med okay to send?

## 2023-07-23 ENCOUNTER — Other Ambulatory Visit: Payer: Self-pay | Admitting: Family Medicine

## 2023-08-13 DIAGNOSIS — Z1231 Encounter for screening mammogram for malignant neoplasm of breast: Secondary | ICD-10-CM | POA: Diagnosis not present

## 2023-08-13 LAB — HM MAMMOGRAPHY

## 2023-08-14 ENCOUNTER — Encounter: Payer: Self-pay | Admitting: Family Medicine

## 2023-08-20 ENCOUNTER — Other Ambulatory Visit: Payer: Self-pay | Admitting: Family Medicine

## 2023-09-13 DIAGNOSIS — S83249A Other tear of medial meniscus, current injury, unspecified knee, initial encounter: Secondary | ICD-10-CM | POA: Insufficient documentation

## 2023-09-13 DIAGNOSIS — M25562 Pain in left knee: Secondary | ICD-10-CM | POA: Diagnosis not present

## 2023-09-13 DIAGNOSIS — S83282A Other tear of lateral meniscus, current injury, left knee, initial encounter: Secondary | ICD-10-CM | POA: Diagnosis not present

## 2023-09-13 DIAGNOSIS — S83242D Other tear of medial meniscus, current injury, left knee, subsequent encounter: Secondary | ICD-10-CM | POA: Diagnosis not present

## 2023-09-14 ENCOUNTER — Telehealth: Payer: Self-pay | Admitting: Family Medicine

## 2023-09-14 NOTE — Telephone Encounter (Signed)
 Type of form received: Surgical Clearance  Additional comments:   Received by: [employer] - Front Desk  Form should be Faxed/mailed to: (address/ fax #) Fax to [phone]  Is patient requesting call for pickup: N/A  Form placed:  Labeled & placed in provider bin  Attach charge sheet.  Provider will determine charge.  Individual made aware of 3-5 business day turn around? N/A

## 2023-09-14 NOTE — Telephone Encounter (Signed)
 Placed in folder at nurse station

## 2023-09-17 ENCOUNTER — Other Ambulatory Visit: Payer: Self-pay | Admitting: Family Medicine

## 2023-09-24 ENCOUNTER — Ambulatory Visit: Payer: Self-pay

## 2023-09-24 NOTE — Telephone Encounter (Signed)
  Chief Complaint: scalp, neck itching Symptoms: moderate scalp and base of neck itching/redness, diarrhea Frequency: constant Pertinent Negatives: Patient denies facial itching, facial swelling, tongue swelling, difficulty breathing, dizziness, too weak to stand Disposition: [] ED /[] Urgent Care (no appt availability in office) / [x] Appointment(In office/virtual)/ []  [place] [facility]/ [] Home Care/ [] Refused Recommended Disposition /[] [facility]/ []  Follow-up with PCP Additional Notes: Patient states she has tried treating the scalp with nystatin ointment she had previously prescribed to her. Patient denies any OTC allergy medication use. Patient states she also needs to be seen because her diarrhea has returned and she did not see the specialist for it due to it had improved after antibiotic use. Offered sooner appts with other providers today, patient declined and would like to see PCP. Patient scheduled with PCP tomorrow.  Copied from CRM [id number]. Topic: Clinical - Red Word Triage >> Sep 24, 2023  7:49 AM Turkey A wrote: [facility] that prompted transfer to Nurse Triage: Patient has very itchy scalp and back of neck is red. Scalp itches until it is irritating. Patient did start using a new shampoo but stopped when this occured Reason for Disposition  Looks infected (spreading redness, red streak, pus)  Answer Assessment - Initial Assessment Questions 1. DESCRIPTION: "Describe the itching you are having." "Where is it located?"     Patient states her whole head is itching (scalp and base of neck).   2. SEVERITY: "How bad is it?"    - MILD: Doesn't interfere with normal activities.   - MODERATE-SEVERE: Interferes with work, school, sleep, or other activities.      She states it is moderate but just constant/continuous.  3. SCRATCHING: "Are there any scratch marks? Bleeding?"     Denies any bleeding but states her neck is red from scratching.  4. ONSET: "When did the  itching begin?"      4 weeks ago, states worsening.  5. CAUSE: "What do you think is causing the itching?"      She states she bought "B[facility]ley MD" hair thickening shampoo and used it several times and did not like it. She states she switched back to her tea tree oil shampoo after 4-5 weeks of being off.  6. OTHER SYMPTOMS: "Do you have any other symptoms?"      She states "I'm still having problems with diarrhea." She states she was placed on a broad spectrum antibiotic a few months ago that seemed to help but has come back. She states she was supposed to follow up with a specialist but cancelled because she thought it was better. She reports 6 watery stools in the last 24 hours.  7. PREGNANCY: "Is there any chance you are pregnant?" "When was your last menstrual period?"     N/A.  Protocols used: Itching - Localized-A-AH

## 2023-09-24 NOTE — Telephone Encounter (Signed)
 FYI about patient care

## 2023-09-25 ENCOUNTER — Telehealth: Payer: Self-pay

## 2023-09-25 ENCOUNTER — Encounter: Payer: Self-pay | Admitting: Family Medicine

## 2023-09-25 ENCOUNTER — Ambulatory Visit (INDEPENDENT_AMBULATORY_CARE_PROVIDER_SITE_OTHER): Admitting: Family Medicine

## 2023-09-25 VITALS — BP 118/62 | HR 82 | Ht 63.0 in | Wt 145.0 lb

## 2023-09-25 DIAGNOSIS — R238 Other skin changes: Secondary | ICD-10-CM

## 2023-09-25 DIAGNOSIS — Z0279 Encounter for issue of other medical certificate: Secondary | ICD-10-CM

## 2023-09-25 DIAGNOSIS — E119 Type 2 diabetes mellitus without complications: Secondary | ICD-10-CM | POA: Diagnosis not present

## 2023-09-25 DIAGNOSIS — Z7985 Long-term (current) use of injectable non-insulin antidiabetic drugs: Secondary | ICD-10-CM

## 2023-09-25 DIAGNOSIS — K529 Noninfective gastroenteritis and colitis, unspecified: Secondary | ICD-10-CM

## 2023-09-25 DIAGNOSIS — R5383 Other fatigue: Secondary | ICD-10-CM | POA: Diagnosis not present

## 2023-09-25 LAB — CBC WITH DIFFERENTIAL/PLATELET
Basophils Absolute: 0 10*3/uL (ref 0.0–0.1)
Basophils Relative: 0.8 % (ref 0.0–3.0)
Eosinophils Absolute: 0.2 10*3/uL (ref 0.0–0.7)
Eosinophils Relative: 3.7 % (ref 0.0–5.0)
HCT: 40.3 % (ref 36.0–46.0)
Hemoglobin: 13.6 g/dL (ref 12.0–15.0)
Lymphocytes Relative: 25.8 % (ref 12.0–46.0)
Lymphs Abs: 1.5 10*3/uL (ref 0.7–4.0)
MCHC: 33.7 g/dL (ref 30.0–36.0)
MCV: 89.1 fl (ref 78.0–100.0)
Monocytes Absolute: 0.5 10*3/uL (ref 0.1–1.0)
Monocytes Relative: 8 % (ref 3.0–12.0)
Neutro Abs: 3.5 10*3/uL (ref 1.4–7.7)
Neutrophils Relative %: 61.7 % (ref 43.0–77.0)
Platelets: 323 10*3/uL (ref 150.0–400.0)
RBC: 4.52 Mil/uL (ref 3.87–5.11)
RDW: 13.4 % (ref 11.5–15.5)
WBC: 5.7 10*3/uL (ref 4.0–10.5)

## 2023-09-25 LAB — BASIC METABOLIC PANEL WITH GFR
BUN: 22 mg/dL (ref 6–23)
CO2: 23 meq/L (ref 19–32)
Calcium: 10.3 mg/dL (ref 8.4–10.5)
Chloride: 103 meq/L (ref 96–112)
Creatinine, Ser: 0.88 mg/dL (ref 0.40–1.20)
GFR: 60.56 mL/min (ref >60.00)
Glucose, Bld: 90 mg/dL (ref 70–99)
Potassium: 3.6 meq/L (ref 3.5–5.1)
Sodium: 138 meq/L (ref 135–145)

## 2023-09-25 LAB — HEPATIC FUNCTION PANEL
ALT: 12 U/L (ref 0–35)
AST: 19 U/L (ref 0–37)
Albumin: 4.9 g/dL (ref 3.5–5.2)
Alkaline Phosphatase: 55 U/L (ref 39–117)
Bilirubin, Direct: 0.1 mg/dL (ref 0.0–0.3)
Total Bilirubin: 0.4 mg/dL (ref 0.2–1.2)
Total Protein: 7.4 g/dL (ref 6.0–8.3)

## 2023-09-25 LAB — TSH: TSH: 1.97 u[IU]/mL (ref 0.35–5.50)

## 2023-09-25 LAB — B12 AND FOLATE PANEL
Folate: >25.2 ng/mL (ref >5.9)
Vitamin B-12: 473 pg/mL (ref 211–911)

## 2023-09-25 LAB — HEMOGLOBIN A1C: Hgb A1c MFr Bld: 5.9 % (ref 4.6–6.5)

## 2023-09-25 MED ORDER — RIFAXIMIN 200 MG PO TABS: 200.0000 mg | ORAL_TABLET | Freq: Three times a day (TID) | ORAL | 0 refills | Status: DC

## 2023-09-25 MED ORDER — CLOBETASOL PROPIONATE 0.05 % EX SHAM: MEDICATED_SHAMPOO | CUTANEOUS | 0 refills | Status: AC

## 2023-09-25 NOTE — Telephone Encounter (Signed)
Called pt and left vm to call office back

## 2023-09-25 NOTE — Telephone Encounter (Signed)
 Copied from CRM [id number]. Topic: General - Call Back - No Documentation >> Sep 25, 2023  2:32 PM Almira Coaster wrote: Reason for CRM: Patient was seen in  the office today and received a call asking for patient to call back, no documentation on file, called [facility] and they advised the nurse was out of the office but that she will give her a call back tomorrow. Patient became upset and wondered if there were no other nurse or person that could speak with her, I apologized and said that there were no documentation on file but that I will send a message advising she returned the call and apologized. Patient disconnected.

## 2023-09-25 NOTE — Patient Instructions (Signed)
 Follow up as needed or as scheduled We'll notify you of your lab results and make any changes if needed START the Xifaxin to treat diarrhea USE the Clobetasol shampoo for the itchy, flaking scalp Drink LOTS of fluids to avoid dehydration We'll call you to reschedule your GI appt w/ [facility] Call with any questions or concerns Hang in there!!

## 2023-09-25 NOTE — Progress Notes (Signed)
   Subjective:    Patient ID: [name], female    DOB: 1940/11/09, 83 y.o.   MRN: [id number]  HPI Itching scalp- had switched shampoo to [facility] MD and then developed redness, itching.  Was previously using tea tree shampoo.  Switched back to tea tree and itching improves.  Diarrhea- pt reports bowels improved s/p abx tx.  Thought things had resolved and cancelled her GI appt.  Back to having 6-7 water stools daily.  Pt reports increased fatigue.  Is not sure if this is related to diarrhea.  Is sleeping 8-9 hrs and then will take a 2 hr nap.   Review of Systems For ROS see HPI     Objective:   Physical Exam Vitals reviewed.  Constitutional:      General: She is not in acute distress.    Appearance: Normal appearance. She is well-developed. She is not ill-appearing.  HENT:     Head: Normocephalic and atraumatic.  Eyes:     Conjunctiva/sclera: Conjunctivae normal.     Pupils: Pupils are equal, round, and reactive to light.  Neck:     Thyroid: No thyromegaly.  Cardiovascular:     Rate and Rhythm: Normal rate and regular rhythm.     Heart sounds: Normal heart sounds. No murmur heard. Pulmonary:     Effort: Pulmonary effort is normal. No respiratory distress.     Breath sounds: Normal breath sounds.  Abdominal:     General: There is no distension.     Palpations: Abdomen is soft.     Tenderness: There is no abdominal tenderness.  Musculoskeletal:     Cervical back: Normal range of motion and neck supple.  Lymphadenopathy:     Cervical: No cervical adenopathy.  Skin:    General: Skin is warm and dry.     Findings: Lesion (scaling patches in scalp and along neck at hairline) present.  Neurological:     General: No focal deficit present.     Mental Status: She is alert and oriented to person, place, and time.  Psychiatric:        Mood and Affect: Mood normal.        Behavior: Behavior normal.           Assessment & Plan:  Scalp irritation- new.  Suspect reaction to  switching shampoo.  Will start Clobetasol shampoo as needed to improve itching and flaking.  Pt expressed understanding and is in agreement w/ plan.   Fatigue- new.  May be related to her ongoing diarrhea.  Will check labs to r/o other possible metabolic causes.  Will address any abnormalities if present.  Pt expressed understanding and is in agreement w/ plan.

## 2023-09-25 NOTE — Telephone Encounter (Signed)
 Needs to schedule a nurse visit for EKG for surgical clearance.  We ordered all labs today at her acute visit but did not think to do the EKG

## 2023-09-25 NOTE — Assessment & Plan Note (Signed)
 Pt reports sxs improved after taking antibiotics last fall for unrelated condition.  She cancelled her GI appt bc she thought things were better.  Since then, things have returned to previous levels.  She reports she is back to 6-7 water stools daily.  Will try Xifaxan and also refer back to GI.  Pt expressed understanding and is in agreement w/ plan.

## 2023-09-25 NOTE — Telephone Encounter (Addendum)
 Pharmacy Patient Advocate Encounter   Received notification from CoverMyMeds that prior authorization for Xifaxan 200MG  tablets is required/requested.   Insurance verification completed.   The patient is insured through [facility] .   Per test claim: PA required; PA started via CoverMyMeds. KEY [id number] . Acknowledge: The requested drug is not covered by the plan. In order for it to be covered, the member is required to have tried and failed all formulary alternatives or confirm that you believe the formulary alternatives would have serious adverse effects and/or be ineffective for the patient. Please answer if the following covered formulary alternatives have been tried and failed, are likely to cause adverse reaction, or are likely to be ineffective?  PLEASE BE ADVISE THESE ARE THE PREFERRED T/F   Action: Has Been Sent To Plan To Process

## 2023-09-26 ENCOUNTER — Telehealth: Payer: Self-pay

## 2023-09-26 ENCOUNTER — Encounter: Payer: Self-pay | Admitting: Family Medicine

## 2023-09-26 NOTE — Telephone Encounter (Signed)
 Pt has been scheduled for EKG nurse visit Pt was upset and states she has never had EKG done due to surgery. Explained to pt this is protocol  and for her safety.

## 2023-09-26 NOTE — Telephone Encounter (Signed)
 Pt has been notified.

## 2023-09-26 NOTE — Telephone Encounter (Signed)
 See phone note in chart.  ?

## 2023-09-26 NOTE — Telephone Encounter (Signed)
 Did they give any indication as to what the alternatives would be?

## 2023-09-26 NOTE — Telephone Encounter (Signed)
-----   Message from Neena Rhymes sent at 09/26/2023  7:41 AM EDT ----- Labs look great!  No obvious cause of fatigue.  I suspect it's just the ongoing diarrhea that is causing exhaustion

## 2023-09-27 ENCOUNTER — Ambulatory Visit (INDEPENDENT_AMBULATORY_CARE_PROVIDER_SITE_OTHER): Admitting: Family Medicine

## 2023-09-27 ENCOUNTER — Telehealth: Payer: Self-pay

## 2023-09-27 DIAGNOSIS — Z01818 Encounter for other preprocedural examination: Secondary | ICD-10-CM

## 2023-09-27 NOTE — Telephone Encounter (Signed)
 PA request has been Denied. New Encounter has been or will be created for follow up. For additional info see Pharmacy Prior Auth telephone encounter from 09/25/23.

## 2023-09-27 NOTE — Telephone Encounter (Signed)
 Pharmacy Patient Advocate Encounter  Received notification from [facility] that Prior Authorization for Xifaxan 200MG  tablets  has been DENIED.  Full denial letter will be uploaded to the media tab. See denial reason below.   PA #/Case ID/Reference #: [id number]

## 2023-09-27 NOTE — Telephone Encounter (Signed)
 Pt has been notified Pt is going to contact insurance

## 2023-09-27 NOTE — Telephone Encounter (Signed)
 Noted.

## 2023-09-27 NOTE — Telephone Encounter (Signed)
 Sent to PA team.

## 2023-09-27 NOTE — Progress Notes (Signed)
 Per Dr.Tabori EKG was done in office as nurse visit

## 2023-10-03 ENCOUNTER — Telehealth: Payer: Self-pay

## 2023-10-03 ENCOUNTER — Telehealth: Payer: Self-pay | Admitting: Family Medicine

## 2023-10-03 DIAGNOSIS — R262 Difficulty in walking, not elsewhere classified: Secondary | ICD-10-CM | POA: Insufficient documentation

## 2023-10-03 DIAGNOSIS — X58XXXA Exposure to other specified factors, initial encounter: Secondary | ICD-10-CM | POA: Diagnosis not present

## 2023-10-03 DIAGNOSIS — M23342 Other meniscus derangements, anterior horn of lateral meniscus, left knee: Secondary | ICD-10-CM | POA: Diagnosis not present

## 2023-10-03 DIAGNOSIS — M65962 Unspecified synovitis and tenosynovitis, left lower leg: Secondary | ICD-10-CM | POA: Diagnosis not present

## 2023-10-03 DIAGNOSIS — M2242 Chondromalacia patellae, left knee: Secondary | ICD-10-CM | POA: Diagnosis not present

## 2023-10-03 DIAGNOSIS — Z0279 Encounter for issue of other medical certificate: Secondary | ICD-10-CM

## 2023-10-03 DIAGNOSIS — S83282A Other tear of lateral meniscus, current injury, left knee, initial encounter: Secondary | ICD-10-CM | POA: Diagnosis not present

## 2023-10-03 DIAGNOSIS — G8918 Other acute postprocedural pain: Secondary | ICD-10-CM | POA: Diagnosis not present

## 2023-10-03 DIAGNOSIS — M11262 Other chondrocalcinosis, left knee: Secondary | ICD-10-CM | POA: Diagnosis not present

## 2023-10-03 DIAGNOSIS — Y999 Unspecified external cause status: Secondary | ICD-10-CM | POA: Diagnosis not present

## 2023-10-03 DIAGNOSIS — S83232A Complex tear of medial meniscus, current injury, left knee, initial encounter: Secondary | ICD-10-CM | POA: Diagnosis not present

## 2023-10-03 NOTE — Telephone Encounter (Signed)
 We received a fax for a fl2 form completion from [employer] at [facility] forms are placed in folder by the nursing station. Dru-- [phone]-- PHONE  [phone]-- FAX  Please advise, Thanks

## 2023-10-03 NOTE — Telephone Encounter (Signed)
 Dru stopped by for patient medication list and to make sure papers for FL2 came through =.

## 2023-10-03 NOTE — Telephone Encounter (Signed)
 Copied from CRM [id number]. Topic: General - Other >> Oct 03, 2023  1:05 PM Howard Macho wrote: Reason for CRM: Dru from [facility] wanted to know if a fax came through for the patient. Dru stated she faxed it several times and she will come to the office to bring the form

## 2023-10-04 DIAGNOSIS — K52839 Microscopic colitis, unspecified: Secondary | ICD-10-CM | POA: Diagnosis not present

## 2023-10-04 DIAGNOSIS — J4520 Mild intermittent asthma, uncomplicated: Secondary | ICD-10-CM | POA: Diagnosis not present

## 2023-10-04 DIAGNOSIS — F39 Unspecified mood [affective] disorder: Secondary | ICD-10-CM | POA: Diagnosis not present

## 2023-10-04 DIAGNOSIS — Z9889 Other specified postprocedural states: Secondary | ICD-10-CM | POA: Diagnosis not present

## 2023-10-04 DIAGNOSIS — F329 Major depressive disorder, single episode, unspecified: Secondary | ICD-10-CM | POA: Insufficient documentation

## 2023-10-04 DIAGNOSIS — M23204 Derangement of unspecified medial meniscus due to old tear or injury, left knee: Secondary | ICD-10-CM | POA: Diagnosis not present

## 2023-10-04 DIAGNOSIS — E119 Type 2 diabetes mellitus without complications: Secondary | ICD-10-CM | POA: Diagnosis not present

## 2023-10-04 DIAGNOSIS — H409 Unspecified glaucoma: Secondary | ICD-10-CM | POA: Diagnosis not present

## 2023-10-04 DIAGNOSIS — M9420 Chondromalacia, unspecified site: Secondary | ICD-10-CM | POA: Diagnosis not present

## 2023-10-04 DIAGNOSIS — Z4789 Encounter for other orthopedic aftercare: Secondary | ICD-10-CM | POA: Diagnosis not present

## 2023-10-04 DIAGNOSIS — E782 Mixed hyperlipidemia: Secondary | ICD-10-CM | POA: Diagnosis not present

## 2023-10-04 DIAGNOSIS — R262 Difficulty in walking, not elsewhere classified: Secondary | ICD-10-CM | POA: Diagnosis not present

## 2023-10-04 DIAGNOSIS — M859 Disorder of bone density and structure, unspecified: Secondary | ICD-10-CM | POA: Diagnosis not present

## 2023-10-04 NOTE — Telephone Encounter (Signed)
Placed in sign folder. 

## 2023-10-05 DIAGNOSIS — E782 Mixed hyperlipidemia: Secondary | ICD-10-CM | POA: Diagnosis not present

## 2023-10-05 DIAGNOSIS — R262 Difficulty in walking, not elsewhere classified: Secondary | ICD-10-CM | POA: Diagnosis not present

## 2023-10-05 DIAGNOSIS — Z4789 Encounter for other orthopedic aftercare: Secondary | ICD-10-CM | POA: Diagnosis not present

## 2023-10-06 DIAGNOSIS — R262 Difficulty in walking, not elsewhere classified: Secondary | ICD-10-CM | POA: Diagnosis not present

## 2023-10-06 DIAGNOSIS — Z4789 Encounter for other orthopedic aftercare: Secondary | ICD-10-CM | POA: Diagnosis not present

## 2023-10-08 DIAGNOSIS — R262 Difficulty in walking, not elsewhere classified: Secondary | ICD-10-CM | POA: Diagnosis not present

## 2023-10-08 DIAGNOSIS — Z4789 Encounter for other orthopedic aftercare: Secondary | ICD-10-CM | POA: Diagnosis not present

## 2023-10-09 ENCOUNTER — Telehealth: Payer: Self-pay

## 2023-10-09 DIAGNOSIS — Z4789 Encounter for other orthopedic aftercare: Secondary | ICD-10-CM | POA: Diagnosis not present

## 2023-10-09 DIAGNOSIS — R262 Difficulty in walking, not elsewhere classified: Secondary | ICD-10-CM | POA: Diagnosis not present

## 2023-10-09 NOTE — Telephone Encounter (Signed)
 Spoke with [name] and advised her these would be completed 5-7 business days. Please email [email or web address] Please call [name] to let her know also

## 2023-10-09 NOTE — Telephone Encounter (Signed)
 I don't handle inquiries about billing or charges.  Without seeing the bill in question, I don't know what it is for.  And again, CRM's should not be sent directly to physicians  Copied from CRM [id number]. Topic: General - Billing Inquiry >> Oct 02, 2023  4:27 PM Albertha Alosa wrote: Reason for CRM: Patient called in regarding a bill that was sent for $29 on  09/25/2023 , patient would like for someone to give her call to explain what this is for , she has already spoke with the billing department and they directed her to speak with someone in the office.   [phone] - Cell    [phone] >> Oct 09, 2023 11:05 AM Havlyn R wrote: Please advise if paperwork charge was reasoning for bill.

## 2023-10-09 NOTE — Telephone Encounter (Signed)
 Called to discuss with patient no answer, this charge is because pt was billed for completion of an FL2 form which is $29 this is not billed through insurance as this is considered patient responsibility. We cannot waive this.

## 2023-10-09 NOTE — Telephone Encounter (Signed)
 Copied from CRM [id number]. Topic: General - Other >> Oct 02, 2023  3:45 PM Taleah C wrote: Reason for CRM: Dru Styles, [employer] at [employer] [facility], called and stated that they sent a request for a FL2 form along with the patient's med list. She explained that she will be having surgery and then at a Rehab facility. She would like to discuss the status of the form being sent. Please call and advise at [facility]: [phone]

## 2023-10-10 DIAGNOSIS — Z4789 Encounter for other orthopedic aftercare: Secondary | ICD-10-CM | POA: Diagnosis not present

## 2023-10-10 DIAGNOSIS — R262 Difficulty in walking, not elsewhere classified: Secondary | ICD-10-CM | POA: Diagnosis not present

## 2023-10-10 NOTE — Telephone Encounter (Signed)
 Called again to discuss no answer, LM to call back

## 2023-10-11 DIAGNOSIS — R262 Difficulty in walking, not elsewhere classified: Secondary | ICD-10-CM | POA: Diagnosis not present

## 2023-10-11 DIAGNOSIS — Z4789 Encounter for other orthopedic aftercare: Secondary | ICD-10-CM | POA: Diagnosis not present

## 2023-10-11 NOTE — Telephone Encounter (Signed)
 Faxed and emailed  Placed in scan bin

## 2023-10-11 NOTE — Telephone Encounter (Signed)
 Form signed.  Please print and attach current med list

## 2023-10-12 DIAGNOSIS — Z4789 Encounter for other orthopedic aftercare: Secondary | ICD-10-CM | POA: Diagnosis not present

## 2023-10-12 DIAGNOSIS — R262 Difficulty in walking, not elsewhere classified: Secondary | ICD-10-CM | POA: Diagnosis not present

## 2023-10-13 DIAGNOSIS — R262 Difficulty in walking, not elsewhere classified: Secondary | ICD-10-CM | POA: Diagnosis not present

## 2023-10-13 DIAGNOSIS — Z4789 Encounter for other orthopedic aftercare: Secondary | ICD-10-CM | POA: Diagnosis not present

## 2023-10-15 ENCOUNTER — Other Ambulatory Visit (HOSPITAL_COMMUNITY): Payer: Self-pay

## 2023-10-15 DIAGNOSIS — Z4789 Encounter for other orthopedic aftercare: Secondary | ICD-10-CM | POA: Diagnosis not present

## 2023-10-15 DIAGNOSIS — R262 Difficulty in walking, not elsewhere classified: Secondary | ICD-10-CM | POA: Diagnosis not present

## 2023-10-15 MED ORDER — RIFAXIMIN 550 MG PO TABS: 550.0000 mg | ORAL_TABLET | Freq: Three times a day (TID) | ORAL | 0 refills | Status: DC

## 2023-10-15 NOTE — Telephone Encounter (Signed)
 The same medication is listed on their preferred alternatives, I just had to change the dose.  New prescription sent to pharmacy.  Hopefully this will be approved

## 2023-10-15 NOTE — Telephone Encounter (Signed)
 Patient has been informed.

## 2023-10-15 NOTE — Telephone Encounter (Signed)
 Pt will need an alternative insurance does not want to pay for medication

## 2023-10-15 NOTE — Addendum Note (Signed)
 Addended by: Elzabeth Mcquerry E on: 10/15/2023 12:26 PM   Modules accepted: Orders

## 2023-10-16 DIAGNOSIS — E785 Hyperlipidemia, unspecified: Secondary | ICD-10-CM | POA: Diagnosis not present

## 2023-10-16 DIAGNOSIS — F39 Unspecified mood [affective] disorder: Secondary | ICD-10-CM | POA: Diagnosis not present

## 2023-10-16 DIAGNOSIS — J4520 Mild intermittent asthma, uncomplicated: Secondary | ICD-10-CM | POA: Diagnosis not present

## 2023-10-16 DIAGNOSIS — M859 Disorder of bone density and structure, unspecified: Secondary | ICD-10-CM | POA: Diagnosis not present

## 2023-10-16 DIAGNOSIS — Z4789 Encounter for other orthopedic aftercare: Secondary | ICD-10-CM | POA: Diagnosis not present

## 2023-10-16 DIAGNOSIS — R262 Difficulty in walking, not elsewhere classified: Secondary | ICD-10-CM | POA: Diagnosis not present

## 2023-10-16 DIAGNOSIS — M23209 Derangement of unspecified meniscus due to old tear or injury, unspecified knee: Secondary | ICD-10-CM | POA: Diagnosis not present

## 2023-10-18 ENCOUNTER — Telehealth: Payer: Self-pay

## 2023-10-18 NOTE — Telephone Encounter (Signed)
 Copied from CRM [id number]. Topic: Clinical - Medication Question >> Oct 15, 2023  2:44 PM [name] wrote: Reason for CRM: Patient is calling about a medication that can help with stomach issues and is wondering if dr is going to send something in for her. >> Oct 18, 2023  1:44 PM [name] wrote: Patient called to follow up on digestion medication. Insurance did not cover original prescription. She was informed by PCP's MA that an alternative would be sent, but pharmacy has not received it yet. Patient requesting update. [facility] [place], [place] - [address] Phone: [phone]  Fax: [phone]      Patient Callback Number: [phone]

## 2023-10-19 ENCOUNTER — Telehealth: Payer: Self-pay

## 2023-10-19 DIAGNOSIS — R262 Difficulty in walking, not elsewhere classified: Secondary | ICD-10-CM | POA: Diagnosis not present

## 2023-10-19 NOTE — Telephone Encounter (Signed)
 I do not see where this has been discusesed recently please advise if pt needs another appt to discuss new med

## 2023-10-19 NOTE — Telephone Encounter (Signed)
 Copied from CRM [id number]. Topic: Clinical - Medication Question >> Oct 15, 2023  2:44 PM [place] La N wrote: Reason for CRM: Patient is calling about a medication that can help with stomach issues and is wondering if dr is going to send something in for her.

## 2023-10-19 NOTE — Telephone Encounter (Signed)
 Called pharmacy, she stated they would run this through again it was still giving her a PA required for the new dose. Confirmed it was the 550mg .

## 2023-10-19 NOTE — Telephone Encounter (Signed)
 Loperamide  is the active ingredient in Imodium  and she has already tried that.  Can we try and do another prior auth for the new xifaxin dose?

## 2023-10-19 NOTE — Telephone Encounter (Signed)
 I called pharmacy back again to recheck on the status of the xifaxan  550mg . It does still require prior auth. I looked through the denial letter again to see if there were other options. Below are the options. Loperamide  does not require a prior auth. The other two still do. How would you like to proceed?

## 2023-10-19 NOTE — Telephone Encounter (Signed)
 I resent the prescription on 4/28.  It's the same medication but it's the dose that was listed on the preferred alternative list.  Pharmacy needs to double check b/c it will look like the same drug- but different dose

## 2023-10-19 NOTE — Telephone Encounter (Signed)
 Sure! I will send to the PA dept

## 2023-10-22 ENCOUNTER — Telehealth: Payer: Self-pay

## 2023-10-22 ENCOUNTER — Other Ambulatory Visit (HOSPITAL_COMMUNITY): Payer: Self-pay

## 2023-10-22 DIAGNOSIS — R262 Difficulty in walking, not elsewhere classified: Secondary | ICD-10-CM | POA: Diagnosis not present

## 2023-10-22 NOTE — Telephone Encounter (Signed)
 Pharmacy Patient Advocate Encounter   Received notification from Pt Calls Messages that prior authorization for Xifaxan  550MG  tablets is required/requested.   Insurance verification completed.   The patient is insured through [facility]    Received notification from [facility] that Prior Authorization for XIFAXAN  550MG  TABS has been APPROVED from 06/20/2023 to 11/05/2023. Ran test claim, Copay is $596.30/ 14 DAYS. This test claim was processed through [facility]- copay amounts may vary at other pharmacies due to pharmacy/plan contracts, or as the patient moves through the different stages of their insurance plan.  KEY# [id number]

## 2023-10-22 NOTE — Telephone Encounter (Signed)
 Is there an alternative you would recommend due to high copay?

## 2023-10-23 MED ORDER — DOXYCYCLINE HYCLATE 100 MG PO TABS: 100.0000 mg | ORAL_TABLET | Freq: Two times a day (BID) | ORAL | 0 refills | Status: DC

## 2023-10-23 NOTE — Addendum Note (Signed)
 Addended by: Pernella Ackerley E on: 10/23/2023 07:23 AM   Modules accepted: Orders

## 2023-10-23 NOTE — Telephone Encounter (Signed)
 I sent in a prescription for Doxycycline  as an alternative to the more expensive Xifaxan 

## 2023-10-23 NOTE — Telephone Encounter (Signed)
 Called patient to let her know Dr.Tabori sent in a different medication to her pharmacy

## 2023-10-24 ENCOUNTER — Other Ambulatory Visit (HOSPITAL_COMMUNITY): Payer: Self-pay

## 2023-10-24 DIAGNOSIS — R262 Difficulty in walking, not elsewhere classified: Secondary | ICD-10-CM | POA: Diagnosis not present

## 2023-10-25 ENCOUNTER — Encounter (HOSPITAL_COMMUNITY): Payer: Self-pay

## 2023-10-26 ENCOUNTER — Telehealth: Payer: Self-pay

## 2023-10-26 NOTE — Telephone Encounter (Signed)
 Copied from CRM [id number]. Topic: Clinical - Medication Question >> Oct 26, 2023  2:39 PM Deaijah H wrote: Reason for CRM: *no question* Patient called in advising [employer] sent a Letter stating they'll approve Xifaxan .  [facility] [place], [place] - [address] [place] [address] Phone: [phone] Fax: [phone]

## 2023-10-29 DIAGNOSIS — E119 Type 2 diabetes mellitus without complications: Secondary | ICD-10-CM | POA: Diagnosis not present

## 2023-10-29 DIAGNOSIS — H0288B Meibomian gland dysfunction left eye, upper and lower eyelids: Secondary | ICD-10-CM | POA: Diagnosis not present

## 2023-10-29 DIAGNOSIS — H43813 Vitreous degeneration, bilateral: Secondary | ICD-10-CM | POA: Diagnosis not present

## 2023-10-29 DIAGNOSIS — H401134 Primary open-angle glaucoma, bilateral, indeterminate stage: Secondary | ICD-10-CM | POA: Diagnosis not present

## 2023-10-29 DIAGNOSIS — H0288A Meibomian gland dysfunction right eye, upper and lower eyelids: Secondary | ICD-10-CM | POA: Diagnosis not present

## 2023-10-29 DIAGNOSIS — Z961 Presence of intraocular lens: Secondary | ICD-10-CM | POA: Diagnosis not present

## 2023-10-30 DIAGNOSIS — R262 Difficulty in walking, not elsewhere classified: Secondary | ICD-10-CM | POA: Diagnosis not present

## 2023-11-01 ENCOUNTER — Ambulatory Visit: Admitting: Family Medicine

## 2023-11-01 DIAGNOSIS — R262 Difficulty in walking, not elsewhere classified: Secondary | ICD-10-CM | POA: Diagnosis not present

## 2023-11-05 ENCOUNTER — Other Ambulatory Visit: Payer: Self-pay | Admitting: Family Medicine

## 2023-11-06 DIAGNOSIS — R262 Difficulty in walking, not elsewhere classified: Secondary | ICD-10-CM | POA: Diagnosis not present

## 2023-11-08 DIAGNOSIS — M25561 Pain in right knee: Secondary | ICD-10-CM | POA: Diagnosis not present

## 2023-11-08 DIAGNOSIS — Z5189 Encounter for other specified aftercare: Secondary | ICD-10-CM | POA: Diagnosis not present

## 2023-11-08 DIAGNOSIS — R262 Difficulty in walking, not elsewhere classified: Secondary | ICD-10-CM | POA: Diagnosis not present

## 2023-11-13 DIAGNOSIS — R262 Difficulty in walking, not elsewhere classified: Secondary | ICD-10-CM | POA: Diagnosis not present

## 2023-11-15 DIAGNOSIS — R262 Difficulty in walking, not elsewhere classified: Secondary | ICD-10-CM | POA: Diagnosis not present

## 2023-11-19 ENCOUNTER — Other Ambulatory Visit: Payer: Self-pay

## 2023-11-19 ENCOUNTER — Emergency Department (HOSPITAL_BASED_OUTPATIENT_CLINIC_OR_DEPARTMENT_OTHER)

## 2023-11-19 ENCOUNTER — Encounter (HOSPITAL_BASED_OUTPATIENT_CLINIC_OR_DEPARTMENT_OTHER): Payer: Self-pay | Admitting: Emergency Medicine

## 2023-11-19 ENCOUNTER — Emergency Department (HOSPITAL_BASED_OUTPATIENT_CLINIC_OR_DEPARTMENT_OTHER)
Admission: EM | Admit: 2023-11-19 | Discharge: 2023-11-19 | Disposition: A | Discharge status: Home / Self Care | Attending: Emergency Medicine | Admitting: Emergency Medicine

## 2023-11-19 DIAGNOSIS — J45909 Unspecified asthma, uncomplicated: Secondary | ICD-10-CM | POA: Insufficient documentation

## 2023-11-19 DIAGNOSIS — Z87442 Personal history of urinary calculi: Secondary | ICD-10-CM | POA: Insufficient documentation

## 2023-11-19 DIAGNOSIS — S59902A Unspecified injury of left elbow, initial encounter: Secondary | ICD-10-CM | POA: Insufficient documentation

## 2023-11-19 DIAGNOSIS — M25521 Pain in right elbow: Secondary | ICD-10-CM | POA: Insufficient documentation

## 2023-11-19 DIAGNOSIS — M25522 Pain in left elbow: Secondary | ICD-10-CM | POA: Diagnosis not present

## 2023-11-19 DIAGNOSIS — W01198A Fall on same level from slipping, tripping and stumbling with subsequent striking against other object, initial encounter: Secondary | ICD-10-CM | POA: Insufficient documentation

## 2023-11-19 DIAGNOSIS — W19XXXA Unspecified fall, initial encounter: Secondary | ICD-10-CM | POA: Diagnosis not present

## 2023-11-19 DIAGNOSIS — M19022 Primary osteoarthritis, left elbow: Secondary | ICD-10-CM | POA: Diagnosis not present

## 2023-11-19 DIAGNOSIS — R262 Difficulty in walking, not elsewhere classified: Secondary | ICD-10-CM | POA: Diagnosis not present

## 2023-11-19 DIAGNOSIS — S59909A Unspecified injury of unspecified elbow, initial encounter: Secondary | ICD-10-CM | POA: Diagnosis not present

## 2023-11-19 DIAGNOSIS — M25422 Effusion, left elbow: Secondary | ICD-10-CM | POA: Diagnosis not present

## 2023-11-19 NOTE — Discharge Instructions (Signed)
 You may have a radial head fracture.  None was seen on the x-ray but you are tender in the spot and there is fluid on the elbow.  Follow-up with either Dr. Glenora Laos or potentially Dr. Sedonia Dad.  Call for appointment later this week.

## 2023-11-19 NOTE — ED Provider Notes (Signed)
 [facility] [place] Provider Note   CSN: [id number] Arrival date & time: 11/19/23  1138     History  Chief Complaint  Patient presents with   Audley Leap SAFIYA GIRDLER is a 83 y.o. female.   Fall  Patient presents after fall.  Was at rehab after recent left knee surgery.  Lost her balance doing a complex maneuver and fell landing on her bilateral elbows.  Complaining of pain both elbows.  States she did bump her head.  However no loss conscious.  No neck pain.  No confusion.  Not on blood thinners.    Past Medical History:  Diagnosis Date   Allergic rhinitis    Anxiety    Asthma    Dysrhythmia    palpitations   Glaucoma    History of kidney stones    Hyperlipemia    Migraines    Palpitations    Pneumonia    hx   Urinary tract infection     Home Medications Prior to Admission medications   Medication Sig Start Date End Date Taking? Authorizing Provider  albuterol  (VENTOLIN  HFA) 108 (90 Base) MCG/ACT inhaler Inhale 2 puffs into the lungs every 6 (six) hours as needed for wheezing or shortness of breath. 05/03/23   Tabori, Katherine E, MD  alendronate  (FOSAMAX ) 70 MG tablet Take 1 tablet (70 mg total) by mouth every 7 (seven) days. Take with a full glass of water  on an empty stomach. 08/22/22   Tabori, Katherine E, MD  atorvastatin  (LIPITOR) 20 MG tablet TAKE ONE (1) TABLET BY MOUTH EACH DAY 08/20/23   Tabori, Katherine E, MD  azelastine  (ASTELIN ) 0.1 % nasal spray Place 1 spray into both nostrils 2 (two) times daily. Use in each nostril as directed 10/10/22   Jess Morita, MD  Biotin  5000 MCG CAPS Take 5,000 mcg by mouth daily.    [provider]  Budeson-Glycopyrrol-Formoterol  (BREZTRI  AEROSPHERE) 160-9-4.8 MCG/ACT AERO Inhale 2 puffs into the lungs in the morning and at bedtime. 05/01/23   Tabori, Katherine E, MD  budesonide  (ENTOCORT EC ) 3 MG 24 hr capsule TAKE ONE CAPSULE BY MOUTH DAILY 09/17/23   Tabori, Katherine E, MD   CALCIUM  PO Take 1 capsule by mouth daily.    [provider]  citalopram  (CELEXA ) 40 MG tablet TAKE ONE (1) TABLET BY MOUTH EVERY DAY 02/19/23   Tabori, Katherine E, MD  Clobetasol  Propionate 0.05 % shampoo Wash hair as needed and leave on for 10-15 minutes before rinsing 09/25/23   Tabori, Katherine E, MD  dicyclomine  (BENTYL ) 20 MG tablet Take 1 tablet (20 mg total) by mouth 3 (three) times daily before meals. 02/02/23   Tabori, Katherine E, MD  doxycycline  (VIBRA -TABS) 100 MG tablet Take 1 tablet (100 mg total) by mouth 2 (two) times daily. 10/23/23   Tabori, Katherine E, MD  famotidine  (PEPCID ) 40 MG tablet TAKE ONE (1) TABLET BY MOUTH EVERY DAY 11/05/23   Tabori, Katherine E, MD  fenofibrate  (TRICOR ) 145 MG tablet TAKE ONE TABLET BY MOUTH EVERY MORNING AFTER BREAKFAST 11/05/23   Tabori, Katherine E, MD  guaiFENesin -codeine  (ROBITUSSIN AC) 100-10 MG/5ML syrup Take 10 mLs by mouth 3 (three) times daily as needed for cough. 03/12/23   Tabori, Katherine E, MD  ibuprofen (ADVIL) 200 MG tablet Take 200 mg by mouth every 6 (six) hours as needed.    [provider]  LUMIGAN 0.01 % SOLN Place 1 drop into both eyes at bedtime.  09/07/14   [provider]  Melatonin 10 MG TABS Take by mouth.    [provider]  montelukast  (SINGULAIR ) 10 MG tablet TAKE 1 TABLET BY MOUTH AT BEDTIME 02/19/23   Tabori, Katherine E, MD  Multiple Vitamins-Minerals (CENTRUM SILVER PO) Take 1 tablet by mouth daily.    [provider]  mupirocin  ointment (BACTROBAN ) 2 % Apply to incision twice daily until completely healed 04/21/22   Sandie Cross, PA-C  nystatin  ointment (MYCOSTATIN ) APPLY TOPICALLY TWICE DAILY 04/30/23   Tabori, Katherine E, MD  omeprazole  (PRILOSEC) 40 MG capsule TAKE 1 CAPSULE BY MOUTH EVERY DAY 07/23/23   Tabori, Katherine E, MD  OZEMPIC , 0.25 OR 0.5 MG/DOSE, 2 MG/3ML SOPN INJECT 0.25 MILLIGRAMS INTO THE SKIN ONCE WEEKLY 04/27/23   Tabori, Katherine E, MD  pantoprazole   (PROTONIX ) 40 MG tablet TAKE ONE (1) TABLET BY MOUTH EVERY DAY 07/17/23   Tabori, Katherine E, MD  triamcinolone  ointment (KENALOG ) 0.1 % Apply 1 Application topically 2 (two) times daily. 03/27/23 03/26/24  Tabori, Katherine E, MD      Allergies    Papain, Papaya derivatives, Mushroom ext cmplx(shiitake-reishi-mait), Mushroom, Mushroom extract complex (obsolete), and Venlafaxine     Review of Systems   Review of Systems  Physical Exam Updated Vital Signs BP (!) 153/101 (BP Location: Right Arm)   Pulse 60   Temp 98.3 F (36.8 C) (Oral)   Resp 16   Ht 5\' 3"  (1.6 m)   Wt 63.5 kg   SpO2 95%   BMI 24.80 kg/m  Physical Exam Vitals and nursing note reviewed.  HENT:     Head:     Comments: No hematoma seen. Chest:     Chest wall: No tenderness.  Abdominal:     Tenderness: There is no abdominal tenderness.  Musculoskeletal:        General: Tenderness present.     Cervical back: Neck supple. No tenderness.     Comments: No tenderness over shoulders or wrist.  Does tenderness over bilateral elbows.  Tenderness over left elbow more posteriorly and also right elbow but to a lesser extent.  Neurological:     Mental Status: She is alert and oriented to person, place, and time.     ED Results / Procedures / Treatments   Labs (all labs ordered are listed, but only abnormal results are displayed) Labs Reviewed - No data to display  EKG None  Radiology DG ELBOW COMPLETE RIGHT (3+VIEW) Result Date: 11/19/2023 CLINICAL DATA:  Pain after fall. EXAM: RIGHT ELBOW - COMPLETE 3+ VIEW COMPARISON:  None Available. FINDINGS: There is no evidence of fracture, dislocation, or joint effusion. Mild degenerative spurring. Soft tissues are unremarkable. IMPRESSION: No fracture or subluxation of the right elbow. Mild degenerative spurring. Electronically Signed   By: Chadwick Colonel M.D.   On: 11/19/2023 14:04   DG Elbow Complete Left Result Date: 11/19/2023 CLINICAL DATA:  Left elbow pain status post  fall with limited range of motion EXAM: LEFT ELBOW - COMPLETE 4 VIEW COMPARISON:  None Available. FINDINGS: There are no findings of fracture or dislocation. Moderate joint effusion. Mild degenerative changes of the elbow. Soft tissues are unremarkable. IMPRESSION: Moderate joint effusion without definite fracture or dislocation. Consider follow-up radiograph in 10-14 days to assess for healing radiographically occult fracture. Electronically Signed   By: Limin  Xu M.D.   On: 11/19/2023 13:33    Procedures Procedures    Medications Ordered in ED Medications - No data to display  ED Course/  Medical Decision Making/ A&P                                 Medical Decision Making Amount and/or Complexity of Data Reviewed Radiology: ordered.   Patient with fall.  Elbow pain.  Did hit head but at this point not on blood thinners and only mild injury.  Do not think we need head CT or cervical spine CT.  Will get bilateral elbow x-rays however.  Left elbow x-ray does show effusion.  No clear fracture.  However is tender in over the radial head.  Will discuss with Ortho about immobilization versus sling versus further imaging.  Right elbow reassuring.  Discussed with Dr. Deatra Face.  Recommends following up with Dr. Glenora Laos, or potentially even Dr. Sedonia Dad.  Sling for immobilization.  Already has pain medicines.  Will discharge home.         Final Clinical Impression(s) / ED Diagnoses Final diagnoses:  Fall, initial encounter  Elbow injury, left, initial encounter    Rx / DC Orders ED Discharge Orders     None         Mozell Arias, MD 11/19/23 1441

## 2023-11-19 NOTE — ED Notes (Signed)
Pt given soup and crackers.

## 2023-11-19 NOTE — ED Triage Notes (Signed)
 Pt fell at rehab after knee lap and  fell during PT hurt left elbow and  very painful and rt elbow hurts also PT AAOX4 no LOC and no thinners

## 2023-11-20 DIAGNOSIS — M19022 Primary osteoarthritis, left elbow: Secondary | ICD-10-CM | POA: Insufficient documentation

## 2023-11-20 DIAGNOSIS — M25522 Pain in left elbow: Secondary | ICD-10-CM | POA: Insufficient documentation

## 2023-11-20 DIAGNOSIS — S52123A Displaced fracture of head of unspecified radius, initial encounter for closed fracture: Secondary | ICD-10-CM | POA: Insufficient documentation

## 2023-11-20 DIAGNOSIS — S52125A Nondisplaced fracture of head of left radius, initial encounter for closed fracture: Secondary | ICD-10-CM | POA: Diagnosis not present

## 2023-11-27 DIAGNOSIS — R262 Difficulty in walking, not elsewhere classified: Secondary | ICD-10-CM | POA: Diagnosis not present

## 2023-11-29 ENCOUNTER — Other Ambulatory Visit: Payer: Self-pay | Admitting: Family Medicine

## 2023-11-29 DIAGNOSIS — R262 Difficulty in walking, not elsewhere classified: Secondary | ICD-10-CM | POA: Diagnosis not present

## 2023-11-30 DIAGNOSIS — S52125D Nondisplaced fracture of head of left radius, subsequent encounter for closed fracture with routine healing: Secondary | ICD-10-CM | POA: Diagnosis not present

## 2023-12-04 DIAGNOSIS — R262 Difficulty in walking, not elsewhere classified: Secondary | ICD-10-CM | POA: Diagnosis not present

## 2023-12-06 DIAGNOSIS — R262 Difficulty in walking, not elsewhere classified: Secondary | ICD-10-CM | POA: Diagnosis not present

## 2023-12-18 DIAGNOSIS — R262 Difficulty in walking, not elsewhere classified: Secondary | ICD-10-CM | POA: Diagnosis not present

## 2023-12-20 DIAGNOSIS — R262 Difficulty in walking, not elsewhere classified: Secondary | ICD-10-CM | POA: Diagnosis not present

## 2023-12-25 DIAGNOSIS — R262 Difficulty in walking, not elsewhere classified: Secondary | ICD-10-CM | POA: Diagnosis not present

## 2023-12-28 DIAGNOSIS — K529 Noninfective gastroenteritis and colitis, unspecified: Secondary | ICD-10-CM | POA: Diagnosis not present

## 2023-12-28 DIAGNOSIS — K52832 Lymphocytic colitis: Secondary | ICD-10-CM | POA: Diagnosis not present

## 2023-12-31 DIAGNOSIS — R262 Difficulty in walking, not elsewhere classified: Secondary | ICD-10-CM | POA: Diagnosis not present

## 2023-12-31 DIAGNOSIS — K529 Noninfective gastroenteritis and colitis, unspecified: Secondary | ICD-10-CM | POA: Diagnosis not present

## 2023-12-31 DIAGNOSIS — K52832 Lymphocytic colitis: Secondary | ICD-10-CM | POA: Diagnosis not present

## 2024-01-02 ENCOUNTER — Other Ambulatory Visit (HOSPITAL_COMMUNITY): Payer: Self-pay

## 2024-01-02 ENCOUNTER — Other Ambulatory Visit: Payer: Self-pay | Admitting: Family Medicine

## 2024-01-02 DIAGNOSIS — R262 Difficulty in walking, not elsewhere classified: Secondary | ICD-10-CM | POA: Diagnosis not present

## 2024-01-03 ENCOUNTER — Telehealth: Payer: Self-pay

## 2024-01-03 ENCOUNTER — Other Ambulatory Visit (HOSPITAL_COMMUNITY): Payer: Self-pay

## 2024-01-03 NOTE — Telephone Encounter (Signed)
 Called patient and left message about medication being approved.

## 2024-01-03 NOTE — Telephone Encounter (Signed)
 Called patient back. She was confused that medication was approved because she rcvd many denials. Patient asked how much the medication will cost, I told her to contact her pharmacy and they will let her know that how much. Patient verbalized understanding.    Copied from CRM [id number]. Topic: Clinical - Medication Prior Auth >> Jan 03, 2024  3:43 PM Viola F wrote: Reason for CRM: Patient returned Cullin Dishman's phone call regarding the prior auth for the Xifaxan  550MG  tablets being APPROVED - I let her know and she requested a call back at [phone] (H)

## 2024-01-03 NOTE — Telephone Encounter (Signed)
.  Pharmacy Patient Advocate Encounter   Received notification from Onbase that prior authorization for Xifaxan  550MG  tablets is required/requested.   Insurance verification completed.   The patient is insured through [facility] .   Per test claim: PA required; PA submitted to above mentioned insurance via CoverMyMeds Key/confirmation #/EOC [id number] Status is pending

## 2024-01-03 NOTE — Telephone Encounter (Signed)
 Pharmacy Patient Advocate Encounter  Received notification from [facility] that Prior Authorization for Xifaxan  550MG  tablets  has been APPROVED from 06/20/23 to 01/17/24   PA #/Case ID/Reference #: [id number]

## 2024-01-04 NOTE — Telephone Encounter (Unsigned)
 Copied from CRM [id number]. Topic: Clinical - Medication Prior Auth >> Jan 03, 2024  4:48 PM Melissa C wrote: Reason for CRM: patient was talking with Meighan earlier today and was told her Xifaxan  was approved for prior authorization. Patient called her pharmacy and they told her it would be 596.30 for one month's supply which she cannot afford. Patient is currently seeing a [place] doctor as well who is helping to manage symptoms as she stated she is still having diarrhea. Patient said if her doctor thinks she needs to go on some sort of medication/antibiotic because she is still having diarrhea she would like to discuss that but she cannot afford the other medication and she really appreciates all the work that went into getting the prior authorization for her. She sung praises of the entire team there and was very grateful. She stated if you would like to speak with her on this matter she will be home after 10am tomorrow.

## 2024-01-08 ENCOUNTER — Other Ambulatory Visit (HOSPITAL_COMMUNITY): Payer: Self-pay

## 2024-02-02 ENCOUNTER — Other Ambulatory Visit: Payer: Self-pay | Admitting: Family Medicine

## 2024-02-04 ENCOUNTER — Other Ambulatory Visit: Payer: Self-pay | Admitting: Family Medicine

## 2024-02-06 ENCOUNTER — Other Ambulatory Visit (HOSPITAL_COMMUNITY): Payer: Self-pay

## 2024-02-12 DIAGNOSIS — H903 Sensorineural hearing loss, bilateral: Secondary | ICD-10-CM | POA: Diagnosis not present

## 2024-02-22 ENCOUNTER — Ambulatory Visit (INDEPENDENT_AMBULATORY_CARE_PROVIDER_SITE_OTHER): Admitting: Family Medicine

## 2024-02-22 ENCOUNTER — Encounter: Payer: Self-pay | Admitting: Family Medicine

## 2024-02-22 VITALS — BP 136/76 | HR 75 | Temp 98.0°F | Resp 16 | Ht 63.0 in | Wt 149.5 lb

## 2024-02-22 DIAGNOSIS — E119 Type 2 diabetes mellitus without complications: Secondary | ICD-10-CM | POA: Diagnosis not present

## 2024-02-22 DIAGNOSIS — L819 Disorder of pigmentation, unspecified: Secondary | ICD-10-CM | POA: Diagnosis not present

## 2024-02-22 DIAGNOSIS — R5383 Other fatigue: Secondary | ICD-10-CM | POA: Diagnosis not present

## 2024-02-22 DIAGNOSIS — M810 Age-related osteoporosis without current pathological fracture: Secondary | ICD-10-CM | POA: Diagnosis not present

## 2024-02-22 LAB — BASIC METABOLIC PANEL WITH GFR
BUN: 27 mg/dL — ABNORMAL HIGH (ref 6–23)
CO2: 26 meq/L (ref 19–32)
Calcium: 9.5 mg/dL (ref 8.4–10.5)
Chloride: 103 meq/L (ref 96–112)
Creatinine, Ser: 1.02 mg/dL (ref 0.40–1.20)
GFR: 50.58 mL/min — ABNORMAL LOW (ref >60.00)
Glucose, Bld: 111 mg/dL — ABNORMAL HIGH (ref 70–99)
Potassium: 3.5 meq/L (ref 3.5–5.1)
Sodium: 139 meq/L (ref 135–145)

## 2024-02-22 LAB — CBC WITH DIFFERENTIAL/PLATELET
Basophils Absolute: 0 K/uL (ref 0.0–0.1)
Basophils Relative: 0.6 % (ref 0.0–3.0)
Eosinophils Absolute: 0.1 K/uL (ref 0.0–0.7)
Eosinophils Relative: 1.6 % (ref 0.0–5.0)
HCT: 36.4 % (ref 36.0–46.0)
Hemoglobin: 12 g/dL (ref 12.0–15.0)
Lymphocytes Relative: 35.7 % (ref 12.0–46.0)
Lymphs Abs: 2.2 K/uL (ref 0.7–4.0)
MCHC: 33 g/dL (ref 30.0–36.0)
MCV: 90.6 fl (ref 78.0–100.0)
Monocytes Absolute: 0.5 K/uL (ref 0.1–1.0)
Monocytes Relative: 7.8 % (ref 3.0–12.0)
Neutro Abs: 3.4 K/uL (ref 1.4–7.7)
Neutrophils Relative %: 54.3 % (ref 43.0–77.0)
Platelets: 273 K/uL (ref 150.0–400.0)
RBC: 4.02 Mil/uL (ref 3.87–5.11)
RDW: 13.7 % (ref 11.5–15.5)
WBC: 6.2 K/uL (ref 4.0–10.5)

## 2024-02-22 LAB — HEPATIC FUNCTION PANEL
ALT: 15 U/L (ref 0–35)
AST: 19 U/L (ref 0–37)
Albumin: 4.4 g/dL (ref 3.5–5.2)
Alkaline Phosphatase: 49 U/L (ref 39–117)
Bilirubin, Direct: 0.1 mg/dL (ref 0.0–0.3)
Total Bilirubin: 0.3 mg/dL (ref 0.2–1.2)
Total Protein: 6.8 g/dL (ref 6.0–8.3)

## 2024-02-22 LAB — HEMOGLOBIN A1C: Hgb A1c MFr Bld: 6.3 % (ref 4.6–6.5)

## 2024-02-22 LAB — LIPID PANEL
Cholesterol: 112 mg/dL (ref 0–200)
HDL: 47.3 mg/dL (ref >39.00)
LDL Cholesterol: 40 mg/dL (ref 0–99)
NonHDL: 65.05
Total CHOL/HDL Ratio: 2
Triglycerides: 126 mg/dL (ref 0.0–149.0)
VLDL: 25.2 mg/dL (ref 0.0–40.0)

## 2024-02-22 LAB — B12 AND FOLATE PANEL
Folate: >22.9 ng/mL (ref >5.9)
Vitamin B-12: 278 pg/mL (ref 211–911)

## 2024-02-22 LAB — VITAMIN D 25 HYDROXY (VIT D DEFICIENCY, FRACTURES): VITD: 39.09 ng/mL (ref 30.00–100.00)

## 2024-02-22 LAB — TSH: TSH: 1.67 u[IU]/mL (ref 0.35–5.50)

## 2024-02-22 NOTE — Patient Instructions (Signed)
 Follow up in 6 months to recheck sugar, blood pressure, cholesterol We'll notify you of your lab results and make any changes if needed Make sure you are eating regularly and drink lots of fluids Keep up the good work on healthy diet and regular exercise- I'm so proud of you!!! The area on your face appears to be sun damage but looks benign (non-cancerous).  Call Dermatology and schedule at your convenience Call with any questions or concerns Stay Safe!  Stay Healthy! Hang in there!!

## 2024-02-22 NOTE — Progress Notes (Signed)
   Subjective:    Patient ID: [name], female    DOB: 08-03-1940, 83 y.o.   MRN: [id number]  HPI Fatigue- pt reports feeling 'kinda lethargic'.  Reports diarrhea is improving.  Is walking 3x/week, going to the gym- s/p knee surgery (repaired meniscus).  Pt reports she is eating regularly and a high protein diet.  Pt reports sleeping well at night.  She has quit alcohol as of this week.  'i have something on my face'- pt reports it has been present for awhile but now has texture to it.   Review of Systems For ROS see HPI     Objective:   Physical Exam Vitals reviewed.  Constitutional:      General: She is not in acute distress.    Appearance: Normal appearance. She is well-developed. She is not ill-appearing.  HENT:     Head: Normocephalic and atraumatic.  Eyes:     Conjunctiva/sclera: Conjunctivae normal.     Pupils: Pupils are equal, round, and reactive to light.  Neck:     Thyroid : No thyromegaly.  Cardiovascular:     Rate and Rhythm: Normal rate and regular rhythm.     Heart sounds: Normal heart sounds. No murmur heard. Pulmonary:     Effort: Pulmonary effort is normal. No respiratory distress.     Breath sounds: Normal breath sounds.  Abdominal:     General: There is no distension.     Palpations: Abdomen is soft.     Tenderness: There is no abdominal tenderness.  Musculoskeletal:     Cervical back: Normal range of motion and neck supple.  Lymphadenopathy:     Cervical: No cervical adenopathy.  Skin:    General: Skin is warm and dry.     Findings: Lesion (keratosis like lesion of face) present.  Neurological:     Mental Status: She is alert and oriented to person, place, and time.  Psychiatric:        Behavior: Behavior normal.           Assessment & Plan:  Fatigue- new.  Pt reports fatigue despite sleeping well at night, going to the gym, and eating well.  Has given up drinking as of this week.  Check labs to assess for possible underlying metabolic  cause.  Will tx any abnormality if present.  Pt expressed understanding and is in agreement w/ plan.   Skin lesion- new.  Suspect keratosis but given the change, she will ask derm about this.

## 2024-02-25 ENCOUNTER — Ambulatory Visit: Payer: Self-pay | Admitting: Family Medicine

## 2024-03-03 NOTE — Progress Notes (Signed)
No note in chart

## 2024-03-03 NOTE — Telephone Encounter (Signed)
 Copied from CRM #[id number]. Topic: General - Call Back - No Documentation >> Mar 03, 2024  9:04 AM Robinson H wrote: Reason for CRM: Patient states she missed a call from the office at the time patient called back no messages or notes in system yet. Patient will be home after 1:30 pm  [place] [phone]

## 2024-03-04 ENCOUNTER — Other Ambulatory Visit: Payer: Self-pay | Admitting: Family Medicine

## 2024-03-11 ENCOUNTER — Other Ambulatory Visit (HOSPITAL_COMMUNITY): Payer: Self-pay

## 2024-03-11 ENCOUNTER — Telehealth: Payer: Self-pay

## 2024-03-11 NOTE — Telephone Encounter (Signed)
 Pharmacy Patient Advocate Encounter  Received notification from [facility] that Prior Authorization for Dicyclomine  HCl 20MG  tablets has been APPROVED from 03/11/24 to 03/11/25  PA #/Case ID/Reference #: [id number]

## 2024-03-11 NOTE — Telephone Encounter (Signed)
 Pharmacy Patient Advocate Encounter   Received notification from Onbase that prior authorization for  Dicyclomine  HCl 20MG  tablets is required/requested.   Insurance verification completed.   The patient is insured through [employer] .   Per test claim: PA required; PA submitted to above mentioned insurance via Latent Key/confirmation #/EOC [id number] Status is pending.

## 2024-03-13 ENCOUNTER — Telehealth: Payer: Self-pay

## 2024-03-13 DIAGNOSIS — M79672 Pain in left foot: Secondary | ICD-10-CM | POA: Diagnosis not present

## 2024-03-13 NOTE — Telephone Encounter (Signed)
 Copied from CRM [id number]. Topic: General - Other >> Mar 13, 2024  1:02 PM Rosina BIRCH wrote: Reason for CRM: patient called stating she called by accident and stated someone was supposed to call her back

## 2024-03-13 NOTE — Telephone Encounter (Signed)
 I do not see any notes in chart stating patient was called.

## 2024-03-20 DIAGNOSIS — H401131 Primary open-angle glaucoma, bilateral, mild stage: Secondary | ICD-10-CM | POA: Diagnosis not present

## 2024-03-20 DIAGNOSIS — H0288B Meibomian gland dysfunction left eye, upper and lower eyelids: Secondary | ICD-10-CM | POA: Diagnosis not present

## 2024-03-20 DIAGNOSIS — H0288A Meibomian gland dysfunction right eye, upper and lower eyelids: Secondary | ICD-10-CM | POA: Diagnosis not present

## 2024-03-25 ENCOUNTER — Telehealth: Payer: Self-pay

## 2024-03-25 NOTE — Telephone Encounter (Signed)
 Called patient and left vm to return call.  Patient is due for  COVID-19 vaccine  Flu vaccine  Tdap vaccine.   She can get these are her pharmacy

## 2024-03-25 NOTE — Telephone Encounter (Signed)
Would you like patient to come in?

## 2024-03-25 NOTE — Telephone Encounter (Signed)
 Copied from CRM #[id number]. Topic: Clinical - Medication Question >> Mar 25, 2024 10:19 AM Arm[place] J wrote: Reason for CRM: Patient wanted to let Dr. Mahlon know that she wants to go back on ozempic  for 3 months to see if it could help with weight loss. If this is something we can do, she would like the medication sent to:  [facility] [place], [place] - [address] [place]  [address] Phone: [phone] Fax: [phone] Hours: Not open 24 hours  Patient also wanted to let Dr. Mahlon know that Mom Lanetra Hartley loves her.

## 2024-03-25 NOTE — Telephone Encounter (Signed)
 Copied from CRM #[id number]. Topic: General - Other >> Mar 25, 2024 10:17 AM [place] J wrote: Reason for CRM: Patient would like if Dr. Mahlon could look over her immunization records to see what injections she's needing this year.

## 2024-03-25 NOTE — Telephone Encounter (Signed)
 Pt called back and was advised

## 2024-03-26 NOTE — Telephone Encounter (Signed)
Pt called for an update.

## 2024-03-27 NOTE — Telephone Encounter (Signed)
 Pt calling in for update. She's not sure if she needs an appt or if request will be approved. Please call at [phone] at home in the morning to inform her of recommendation.

## 2024-03-28 ENCOUNTER — Telehealth: Payer: Self-pay

## 2024-03-28 ENCOUNTER — Other Ambulatory Visit: Payer: Self-pay | Admitting: Family Medicine

## 2024-03-28 MED ORDER — OZEMPIC (0.25 OR 0.5 MG/DOSE) 2 MG/3ML ~~LOC~~ SOPN: 0.2500 mg | PEN_INJECTOR | SUBCUTANEOUS | 2 refills | Status: AC

## 2024-03-28 MED ORDER — OZEMPIC (0.25 OR 0.5 MG/DOSE) 2 MG/3ML ~~LOC~~ SOPN: 0.2500 mg | PEN_INJECTOR | SUBCUTANEOUS | 2 refills | Status: DC

## 2024-03-28 NOTE — Progress Notes (Signed)
Prescription sent as pt requested.

## 2024-03-28 NOTE — Telephone Encounter (Signed)
 Copied from CRM [id number]. Topic: Clinical - Medication Refill >> Mar 28, 2024 10:15 AM Robinson H wrote: Medication: OZEMPIC  OR 0.5 MG/DOSE, 2 MG/3ML SOPN  Has the patient contacted their pharmacy? No, didn't know if she had refills wanted to start taking back (Agent: If no, request that the patient contact the pharmacy for the refill. If patient does not wish to contact the pharmacy document the reason why and proceed with request.) (Agent: If yes, when and what did the pharmacy advise?)  This is the patient's preferred pharmacy:  [facility] [place], [place] - [address] [place] [address] Phone: [phone] Fax: [phone]   Is this the correct pharmacy for this prescription? Yes If no, delete pharmacy and type the correct one.   Has the prescription been filled recently? No  Is the patient out of the medication? Yes  Has the patient been seen for an appointment in the last year OR does the patient have an upcoming appointment? Yes  Can we respond through MyChart? Yes  Agent: Please be advised that Rx refills may take up to 3 business days. We ask that you follow-up with your pharmacy.

## 2024-03-28 NOTE — Telephone Encounter (Signed)
 Will send prescription to pharmacy.  However [employer] is sending messages/converting CRMs I am not able to prescribe from these encounters and have to open a new 'orders only' encounter every time.  Including office manager on this message to see if this can be corrected.

## 2024-03-28 NOTE — Telephone Encounter (Signed)
 Pharmacy Patient Advocate Encounter   Received notification from Onbase that prior authorization for Ozempic  (0.25 or 0.5 MG/DOSE) 2MG /3ML pen-injectors is required/requested.   Insurance verification completed.   The patient is insured through [facility].   Per test claim: PA required; PA submitted to above mentioned insurance via Latent Key/confirmation #/EOC [facility] Status is pending

## 2024-03-28 NOTE — Telephone Encounter (Signed)
 Pharmacy Patient Advocate Encounter  Received notification from [facility] that Prior Authorization for Ozempic  (0.25 or 0.5 MG/DOSE) 2MG /3ML pen-injectors has been APPROVED from 06/20/23 to 03/28/25.   PA #/Case ID/Reference #: [id number]

## 2024-03-28 NOTE — Addendum Note (Signed)
 Addended by: Lether Tesch E on: 03/28/2024 05:08 PM   Modules accepted: Orders

## 2024-03-31 NOTE — Telephone Encounter (Unsigned)
 Copied from CRM [id number]. Topic: General - Other >> Mar 31, 2024  8:54 AM Rosina BIRCH wrote: Reason for CRM: patient returning a call to the cma. I read the mychart message to her about her ozempic  being approved

## 2024-03-31 NOTE — Telephone Encounter (Signed)
 Called patient and left vm letting her know that medication was approved. Okay to leave detailed message per [facility]

## 2024-04-03 ENCOUNTER — Other Ambulatory Visit: Payer: Self-pay | Admitting: Family Medicine

## 2024-04-03 ENCOUNTER — Other Ambulatory Visit: Payer: Self-pay

## 2024-04-03 MED ORDER — OMEPRAZOLE 40 MG PO CPDR: 40.0000 mg | DELAYED_RELEASE_CAPSULE | Freq: Every day | ORAL | 1 refills | Status: DC

## 2024-04-04 DIAGNOSIS — S92515A Nondisplaced fracture of proximal phalanx of left lesser toe(s), initial encounter for closed fracture: Secondary | ICD-10-CM | POA: Diagnosis not present

## 2024-04-04 DIAGNOSIS — Z23 Encounter for immunization: Secondary | ICD-10-CM | POA: Diagnosis not present

## 2024-04-04 DIAGNOSIS — M79672 Pain in left foot: Secondary | ICD-10-CM | POA: Diagnosis not present

## 2024-04-29 ENCOUNTER — Telehealth: Payer: Self-pay

## 2024-04-29 NOTE — Telephone Encounter (Signed)
 Bone Density order from [facility] placed in provider bin for signature

## 2024-04-30 NOTE — Telephone Encounter (Signed)
 Placed in sign folder at nurses station

## 2024-05-01 ENCOUNTER — Other Ambulatory Visit: Payer: Self-pay | Admitting: Family Medicine

## 2024-05-06 NOTE — Telephone Encounter (Signed)
 Form completed and returned to [place]

## 2024-05-06 NOTE — Telephone Encounter (Signed)
 Faxed and placed in scan bin

## 2024-05-12 ENCOUNTER — Ambulatory Visit: Payer: Self-pay

## 2024-05-12 NOTE — Telephone Encounter (Signed)
 FYI Only or Action Required?: FYI only for provider: appointment scheduled on 05/12/2024.  Patient was last seen in primary care on 02/22/2024 by Mahlon Comer BRAVO, MD.  Called Nurse Triage reporting Sore Throat.  Symptoms began 1.5 weeks.  Interventions attempted: Nothing.  Symptoms are: gradually worsening.  Triage Disposition: See Physician Within 24 Hours  Patient/caregiver understands and will follow disposition?: Yes   Copied from CRM #[id number]. Topic: Clinical - Red Word Triage >> May 12, 2024  9:07 AM Rea ORN wrote: Red Word that prompted transfer to Nurse Triage: Chills, extremly fatigue, sore throat Reason for Disposition  SEVERE throat pain (e.g., excruciating)  Answer Assessment - Initial Assessment Questions 1. ONSET: When did the throat start hurting? (Hours or days ago)      X 1 5 week 2. SEVERITY: How bad is the sore throat? (Scale 1-10; mild, moderate or severe)     moderate 3. STREP EXPOSURE: Has there been any exposure to strep within the past week? If Yes, ask: What type of contact occurred?      no 4.  VIRAL SYMPTOMS: Are there any symptoms of a cold, such as a runny nose, cough, hoarse voice or red eyes?      Runny nose, sneeze, no appetite, extreme fatigue, chills,  5. FEVER: Do you have a fever? If Yes, ask: What is your temperature, how was it measured, and when did it start?     no 6. PUS ON THE TONSILS: Is there pus on the tonsils in the back of your throat?     no 7. OTHER SYMPTOMS: Do you have any other symptoms? (e.g., difficulty breathing, headache, rash)      no 8. PREGNANCY: Is there any chance you are pregnant? When was your last menstrual period?     na  Protocols used: Sore Throat-A-AH

## 2024-05-13 ENCOUNTER — Encounter: Payer: Self-pay | Admitting: Family Medicine

## 2024-05-13 ENCOUNTER — Ambulatory Visit: Admitting: Family Medicine

## 2024-05-13 VITALS — BP 132/70 | HR 74 | Temp 97.5°F | Resp 22 | Ht 63.0 in | Wt 154.0 lb

## 2024-05-13 DIAGNOSIS — J329 Chronic sinusitis, unspecified: Secondary | ICD-10-CM

## 2024-05-13 DIAGNOSIS — B9689 Other specified bacterial agents as the cause of diseases classified elsewhere: Secondary | ICD-10-CM

## 2024-05-13 DIAGNOSIS — R0981 Nasal congestion: Secondary | ICD-10-CM | POA: Diagnosis not present

## 2024-05-13 DIAGNOSIS — J029 Acute pharyngitis, unspecified: Secondary | ICD-10-CM | POA: Diagnosis not present

## 2024-05-13 LAB — POCT INFLUENZA A/B
Influenza A, POC: NEGATIVE
Influenza B, POC: NEGATIVE

## 2024-05-13 LAB — POCT COVID BINAXNOW CARD: SARS Coronavirus 2 Ag: NEGATIVE

## 2024-05-13 LAB — POCT RAPID STREP A (OFFICE): Rapid Strep A Screen: NEGATIVE

## 2024-05-13 MED ORDER — AMOXICILLIN 875 MG PO TABS: 875.0000 mg | ORAL_TABLET | Freq: Two times a day (BID) | ORAL | 0 refills | Status: AC

## 2024-05-13 NOTE — Progress Notes (Signed)
   Subjective:    Patient ID: [name], female    DOB: [birth year], 83 y.o.   MRN: [id number]  HPI Sore throat- pt reports sxs started 10-12 days ago, 'feeling really bad'.  Stayed in bed for 4-5 days.  Then started feeling better for the next few days.  Then sxs returned- chills, feeling feverish.  + congestion, fatigue.  Has still been exercising but having a hard time getting herself there.  Denies sinus pain.  Denies HA- 'my whole head feels odd', ear pain.  No cough.   Review of Systems For ROS see HPI     Objective:   Physical Exam Vitals reviewed.  Constitutional:      General: She is not in acute distress.    Appearance: She is well-developed.  HENT:     Head: Normocephalic and atraumatic.     Right Ear: Tympanic membrane normal.     Left Ear: Tympanic membrane normal.     Nose: Mucosal edema and congestion present. No rhinorrhea.     Right Sinus: Maxillary sinus tenderness and frontal sinus tenderness present.     Left Sinus: Maxillary sinus tenderness and frontal sinus tenderness present.     Mouth/Throat:     Pharynx: Uvula midline. No oropharyngeal exudate or posterior oropharyngeal erythema.  Eyes:     Conjunctiva/sclera: Conjunctivae normal.     Pupils: Pupils are equal, round, and reactive to light.  Cardiovascular:     Rate and Rhythm: Normal rate and regular rhythm.     Heart sounds: Normal heart sounds.  Pulmonary:     Effort: Pulmonary effort is normal. No respiratory distress.     Breath sounds: Normal breath sounds. No wheezing.  Musculoskeletal:     Cervical back: Normal range of motion and neck supple.  Lymphadenopathy:     Cervical: No cervical adenopathy.  Skin:    General: Skin is warm and dry.  Neurological:     General: No focal deficit present.     Mental Status: She is alert and oriented to person, place, and time.     Cranial Nerves: No cranial nerve deficit.     Motor: No weakness.     Coordination: Coordination normal.  Psychiatric:         Mood and Affect: Mood normal.        Behavior: Behavior normal.        Thought Content: Thought content normal.           Assessment & Plan:  Bacterial sinusitis- new.  Pt's sxs were vague but her PE is consistent w/ sinusitis.  Flu and COVID negative.  Start abx.  Reviewed supportive care and red flags that should prompt return.  Pt expressed understanding and is in agreement w/ plan.

## 2024-05-13 NOTE — Patient Instructions (Signed)
 Follow up as needed or as scheduled START the Amoxicillin  twice daily- take w/ food Drink LOTS of fluids REST!! Call with any questions or concerns Hang in there! Happy Thanksgiving!!!

## 2024-05-19 ENCOUNTER — Other Ambulatory Visit: Payer: Self-pay | Admitting: Family Medicine

## 2024-05-29 ENCOUNTER — Ambulatory Visit: Payer: Self-pay

## 2024-05-29 NOTE — Telephone Encounter (Signed)
 FYI Only or Action Required?: FYI only for provider: appointment scheduled on tomorrow.  Patient was last seen in primary care on 05/13/2024 by Mahlon Comer BRAVO, MD.  Called Nurse Triage reporting Sinusitis. Pt continues with ha/ sinus pain pressure and discolored sinus drainage. Not eating well.  Symptoms began several weeks ago.  Interventions attempted: Prescription medications: Antibiotics.  Symptoms are: gradually worsening.  Triage Disposition: See Physician Within 24 Hours  Patient/caregiver understands and will follow disposition?: Yes                     Copied from CRM #[id number]. Topic: Clinical - Red Word Triage >> May 29, 2024 10:04 AM Drema MATSU wrote: Red Word that prompted transfer to Nurse Triage: Patient has finished medication for sinus a couple days ago but she is still has discolored mucous and still feeling nauseous Reason for Disposition  [1] Taking antibiotic > 72 hours (3 days) AND [2] sinus pain not improved  Answer Assessment - Initial Assessment Questions 1. ANTIBIOTIC: What antibiotic are you taking? How many times a day?     Amoxicillin  2. ONSET: When was the antibiotic started?     05/13/2024 3. PAIN: How bad is the pain?   (Scale 0-10; or none, mild, moderate or severe)     moderate 4. FEVER: Do you have a fever? If Yes, ask: What is it, how was it measured, and when did it start?      no 5. SYMPTOMS: Are there any other symptoms you're concerned about? If Yes, ask: When did it start?     Nausea  Protocols used: Sinus Infection on Antibiotic Follow-up Call-A-AH

## 2024-05-30 ENCOUNTER — Encounter: Payer: Self-pay | Admitting: Family Medicine

## 2024-05-30 ENCOUNTER — Ambulatory Visit: Admitting: Family Medicine

## 2024-05-30 VITALS — BP 126/64 | HR 75 | Temp 98.1°F | Ht 63.0 in | Wt 145.0 lb

## 2024-05-30 DIAGNOSIS — Z7985 Long-term (current) use of injectable non-insulin antidiabetic drugs: Secondary | ICD-10-CM

## 2024-05-30 DIAGNOSIS — R0981 Nasal congestion: Secondary | ICD-10-CM

## 2024-05-30 DIAGNOSIS — S92515A Nondisplaced fracture of proximal phalanx of left lesser toe(s), initial encounter for closed fracture: Secondary | ICD-10-CM | POA: Diagnosis not present

## 2024-05-30 DIAGNOSIS — E119 Type 2 diabetes mellitus without complications: Secondary | ICD-10-CM | POA: Diagnosis not present

## 2024-05-30 LAB — POC COVID19/FLU A&B COMBO
Covid Antigen, POC: NEGATIVE
Influenza A Antigen, POC: NEGATIVE
Influenza B Antigen, POC: NEGATIVE

## 2024-05-30 LAB — MICROALBUMIN / CREATININE URINE RATIO
Creatinine,U: 92 mg/dL
Microalb Creat Ratio: 32.5 mg/g — ABNORMAL HIGH (ref 0.0–30.0)
Microalb, Ur: 3 mg/dL — ABNORMAL HIGH (ref 0.0–1.9)

## 2024-05-30 LAB — POC COVID19 BINAXNOW: SARS Coronavirus 2 Ag: NEGATIVE

## 2024-05-30 MED ORDER — PREDNISONE 10 MG PO TABS: ORAL_TABLET | ORAL | 0 refills | Status: AC

## 2024-05-30 NOTE — Patient Instructions (Signed)
 Follow up as needed or as scheduled START the Prednisone  as directed- 3 pills at the same time x3 days, then 2 pills at the same time x3 days, then 1 pill daily.  Take w/ food  Drink LOTS of fluids REST when needed Schedule your eye exam at your convenience and have them send me the report Call with any questions or concerns Hang in there! MERRY CHRISTMAS!!!

## 2024-05-30 NOTE — Progress Notes (Unsigned)
° °  Subjective:    Patient ID: Rock CHRISTELLA Boros, female    DOB: March 19, 1941, 83 y.o.   MRN: [id number]  HPI Bacterial sinusitis- was started on Amoxicillin  on 11/25.  Pt reports having chills, thick drainage from nose/sinuses- bloody or yellow.  Continues to have HA behind eyes.  Completed abx but 'i feel really poopy'.  No tooth pain.  Denies sinus pressure when leaning forward.  + nausea, intermittent dizziness.  DM- due for foot exam, microalbumin.  Due for eye exam.   Review of Systems For ROS see HPI     Objective:   Physical Exam Vitals reviewed.  Constitutional:      General: She is not in acute distress.    Appearance: Normal appearance. She is not ill-appearing.  HENT:     Head: Normocephalic and atraumatic.     Right Ear: Tympanic membrane and ear canal normal.     Left Ear: Tympanic membrane and ear canal normal.     Nose: Congestion present. No rhinorrhea.     Comments: No TTP over frontal or maxillary sinuses    Mouth/Throat:     Mouth: Mucous membranes are moist.     Pharynx: Oropharynx is clear. No oropharyngeal exudate or posterior oropharyngeal erythema.  Eyes:     Extraocular Movements: Extraocular movements intact.     Conjunctiva/sclera: Conjunctivae normal.  Cardiovascular:     Rate and Rhythm: Normal rate and regular rhythm.  Pulmonary:     Effort: Pulmonary effort is normal. No respiratory distress.     Breath sounds: No wheezing or rhonchi.  Musculoskeletal:     Cervical back: Neck supple.  Lymphadenopathy:     Cervical: No cervical adenopathy.  Skin:    General: Skin is warm and dry.  Neurological:     General: No focal deficit present.     Mental Status: She is alert and oriented to person, place, and time.  Psychiatric:        Mood and Affect: Mood normal.        Behavior: Behavior normal.        Thought Content: Thought content normal.           Assessment & Plan:  Sinus congestion- seems that bacterial infxn has resolved but she  continues to have fullness, pressure, some dizziness, nausea.  COVID and flu negative.  Will start low dose Prednisone  taper for inflammation.  Reviewed supportive care and red flags that should prompt return.  Pt expressed understanding and is in agreement w/ plan.   DM- chronic problem, microalbumin ordered.  Pt to schedule eye exam.

## 2024-06-02 ENCOUNTER — Ambulatory Visit: Payer: Self-pay | Admitting: Family Medicine

## 2024-06-02 MED ORDER — LOSARTAN POTASSIUM 25 MG PO TABS: 25.0000 mg | ORAL_TABLET | Freq: Every day | ORAL | 1 refills | Status: AC

## 2024-06-02 NOTE — Telephone Encounter (Signed)
Patient responded.

## 2024-06-05 ENCOUNTER — Ambulatory Visit: Payer: Self-pay

## 2024-06-05 NOTE — Telephone Encounter (Signed)
 FYI Only or Action Required?: Action required by provider: request for appointment and update on patient condition.  Patient was last seen in primary care on 05/30/2024 by Mahlon Comer BRAVO, MD.  Called Nurse Triage reporting Facial Pain.  Symptoms began several weeks ago.  Interventions attempted: Prescription medications: Prednisone  and Rest, hydration, or home remedies.  Symptoms are: gradually worsening.  Triage Disposition: See PCP When Office is Open (Within 3 Days)  Patient/caregiver understands and will follow disposition?: No, wishes to speak with PCP    Copied from CRM #[id number]. Topic: Clinical - Red Word Triage >> Jun 05, 2024  2:24 PM Alfonso ORN wrote: Red Word that prompted transfer to Nurse Triage: steroids for sinus infection given . sinus infection getting better. blowing snot and blood in nose, nose bleeds , headaches(pressure behind eyes and forehead). Reason for Disposition  [1] Sinus congestion (pressure, fullness) AND [2] present > 10 days  [1] Mild-moderate nosebleed AND [2] bleeding stopped now  Answer Assessment - Initial Assessment Questions Additional info: Sinus symptoms started in October, she completed 10 days of amoxicillin  with only minimal symptom improvement, her sore throat resolved but sinus pressure and congestion persisted. She had office visit 05/30/24 she received prednisone  taper, has 3 days are left, calling today  because symptoms are worsening, she is now having blood in her nasal discharge and ears feel full.  No appointments are available with pcp until 06/17/24. Patient is requesting a call back from pcp for advise on further management of her symptoms.    1. LOCATION: Where does it hurt?      Band around head, around eyes  2. ONSET: When did the sinus pain start?  (e.g., hours, days)      Several weeks  3. SEVERITY: How bad is the pain?   (Scale 0-10; or none, mild, moderate or severe)     moderate 4. RECURRENT SYMPTOM: Have  you ever had sinus problems before? If Yes, ask: When was the last time? and What happened that time?       5. NASAL CONGESTION: Is the nose blocked? If Yes, ask: Can you open it or must you breathe through your mouth?     yes 6. NASAL DISCHARGE: Do you have discharge from your nose? If so ask, What color?     Yes, blood mixed in discharge 7. FEVER: Do you have a fever? If Yes, ask: What is it, how was it measured, and when did it start?      Denies  8. OTHER SYMPTOMS: Do you have any other symptoms? (e.g., sore throat, cough, earache, difficulty breathing)     Ears feel congested. Fatigue.  Protocols used: Sinus Pain or Congestion-A-AH, Nosebleed-A-AH

## 2024-06-05 NOTE — Telephone Encounter (Signed)
 Called patient and she declined an appt with other clinic and she is going finish the 3 days she has left of her prescription and see how she does

## 2024-06-16 ENCOUNTER — Telehealth: Payer: Self-pay | Admitting: Family Medicine

## 2024-06-16 DIAGNOSIS — J329 Chronic sinusitis, unspecified: Secondary | ICD-10-CM

## 2024-06-16 NOTE — Telephone Encounter (Signed)
 Referral placed for ENT for dx of sinusitis w/ nasal polyps

## 2024-06-16 NOTE — Telephone Encounter (Signed)
 Copied from CRM [id number]. Topic: Referral - Request for Referral >> Jun 16, 2024 11:04 AM Viola F wrote: Did the patient discuss referral with their provider in the last year? Yes (If No - schedule appointment) (If Yes - send message)  Appointment offered? No  Type of order/referral and detailed reason for visit: regrown polyps in sinuses  Preference of office, provider, location: ENT   If referral order, have you been seen by this specialty before? No (If Yes, this issue or another issue? When? Where?  Can we respond through MyChart? Yes but she prefers a call when referral is in

## 2024-06-16 NOTE — Telephone Encounter (Signed)
Patient is needing a referral for ENT.

## 2024-06-16 NOTE — Addendum Note (Signed)
 Addended by: Won Kreuzer E on: 06/16/2024 12:45 PM   Modules accepted: Orders

## 2024-06-25 ENCOUNTER — Ambulatory Visit: Admitting: *Deleted

## 2024-06-25 VITALS — Ht 63.0 in | Wt 145.0 lb

## 2024-06-25 DIAGNOSIS — Z0000 Encounter for general adult medical examination without abnormal findings: Secondary | ICD-10-CM

## 2024-06-25 NOTE — Patient Instructions (Signed)
 [name],  Thank you for taking the time for your Medicare Wellness Visit. I appreciate your continued commitment to your health goals. Please review the care plan we discussed, and feel free to reach out if I can assist you further.  Please note that Annual Wellness Visits do not include a physical exam. Some assessments may be limited, especially if the visit was conducted virtually. If needed, we may recommend an in-person follow-up with your provider.  Ongoing Care Seeing your primary care provider every 3 to 6 months helps us  monitor your health and provide consistent, personalized care.   Referrals If a referral was made during today's visit and you haven't received any updates within two weeks, please contact the referred provider directly to check on the status.  Recommended Screenings:  Health Maintenance  Topic Date Due   DTaP/Tdap/Td vaccine (2 - Td or Tdap) 04/20/2022   Eye exam for diabetics  10/26/2023   COVID-19 Vaccine (8 - 2025-26 season) 02/18/2024   Breast Cancer Screening  08/12/2024   Hemoglobin A1C  08/21/2024   Yearly kidney function blood test for diabetes  02/21/2025   Yearly kidney health urinalysis for diabetes  05/30/2025   Complete foot exam   05/30/2025   Medicare Annual Wellness Visit  06/25/2025   Pneumococcal Vaccine for age over 68  Completed   Flu Shot  Completed   Osteoporosis screening with Bone Density Scan  Completed   Zoster (Shingles) Vaccine  Completed   Meningitis B Vaccine  Aged Out       11/16/2022    9:45 AM  Advanced Directives  Does Patient Have a Medical Advance Directive? Yes  Type of Estate Agent of [place];Living will  Copy of Healthcare Power of Attorney in Chart? Yes - validated most recent copy scanned in chart (See row information)    Vision: Annual vision screenings are recommended for early detection of glaucoma, cataracts, and diabetic retinopathy. These exams can also reveal signs of chronic  conditions such as diabetes and high blood pressure.  Dental: Annual dental screenings help detect early signs of oral cancer, gum disease, and other conditions linked to overall health, including heart disease and diabetes.  Please see the attached documents for additional preventive care recommendations.    [name] , Thank you for taking time to come for your Medicare Wellness Visit. I appreciate your ongoing commitment to your health goals. Please review the following plan we discussed and let me know if I can assist you in the future.   Screening recommendations/referrals: Colonoscopy:  Mammogram:  Bone Density:  Recommended yearly ophthalmology/optometry visit for glaucoma screening and checkup Recommended yearly dental visit for hygiene and checkup  Vaccinations: Influenza vaccine:  Pneumococcal vaccine:  Tdap vaccine:  Shingles vaccine:        Preventive Care 65 Years and Older, Female Preventive care refers to lifestyle choices and visits with your health care provider that can promote health and wellness. What does preventive care include? A yearly physical exam. This is also called an annual well check. Dental exams once or twice a year. Routine eye exams. Ask your health care provider how often you should have your eyes checked. Personal lifestyle choices, including: Daily care of your teeth and gums. Regular physical activity. Eating a healthy diet. Avoiding tobacco and drug use. Limiting alcohol use. Practicing safe sex. Taking low-dose aspirin every day. Taking vitamin and mineral supplements as recommended by your health care provider. What happens during an annual well check?  The services and screenings done by your health care provider during your annual well check will depend on your age, overall health, lifestyle risk factors, and family history of disease. Counseling  Your health care provider may ask you questions about your: Alcohol use. Tobacco  use. Drug use. Emotional well-being. Home and relationship well-being. Sexual activity. Eating habits. History of falls. Memory and ability to understand (cognition). Work and work astronomer. Reproductive health. Screening  You may have the following tests or measurements: Height, weight, and BMI. Blood pressure. Lipid and cholesterol levels. These may be checked every 5 years, or more frequently if you are over 49 years old. Skin check. Lung cancer screening. You may have this screening every year starting at age 78 if you have a 30-pack-year history of smoking and currently smoke or have quit within the past 15 years. Fecal occult blood test (FOBT) of the stool. You may have this test every year starting at age 70. Flexible sigmoidoscopy or colonoscopy. You may have a sigmoidoscopy every 5 years or a colonoscopy every 10 years starting at age 13. Hepatitis C blood test. Hepatitis B blood test. Sexually transmitted disease (STD) testing. Diabetes screening. This is done by checking your blood sugar (glucose) after you have not eaten for a while (fasting). You may have this done every 1-3 years. Bone density scan. This is done to screen for osteoporosis. You may have this done starting at age 3. Mammogram. This may be done every 1-2 years. Talk to your health care provider about how often you should have regular mammograms. Talk with your health care provider about your test results, treatment options, and if necessary, the need for more tests. Vaccines  Your health care provider may recommend certain vaccines, such as: Influenza vaccine. This is recommended every year. Tetanus, diphtheria, and acellular pertussis (Tdap, Td) vaccine. You may need a Td booster every 10 years. Zoster vaccine. You may need this after age 88. Pneumococcal 13-valent conjugate (PCV13) vaccine. One dose is recommended after age 52. Pneumococcal polysaccharide (PPSV23) vaccine. One dose is recommended  after age 46. Talk to your health care provider about which screenings and vaccines you need and how often you need them. This information is not intended to replace advice given to you by your health care provider. Make sure you discuss any questions you have with your health care provider. Document Released: 07/02/2015 Document Revised: 02/23/2016 Document Reviewed: 04/06/2015 Elsevier Interactive Patient Education  2017 [employer].  Fall Prevention in the Home Falls can cause injuries. They can happen to people of all ages. There are many things you can do to make your home safe and to help prevent falls. What can I do on the outside of my home? Regularly fix the edges of walkways and driveways and fix any cracks. Remove anything that might make you trip as you walk through a door, such as a raised step or threshold. Trim any bushes or trees on the path to your home. Use bright outdoor lighting. Clear any walking paths of anything that might make someone trip, such as rocks or tools. Regularly check to see if handrails are loose or broken. Make sure that both sides of any steps have handrails. Any raised decks and porches should have guardrails on the edges. Have any leaves, snow, or ice cleared regularly. Use sand or salt on walking paths during winter. Clean up any spills in your garage right away. This includes oil or grease spills. What can I do in the  bathroom? Use night lights. Install grab bars by the toilet and in the tub and shower. Do not use towel bars as grab bars. Use non-skid mats or decals in the tub or shower. If you need to sit down in the shower, use a plastic, non-slip stool. Keep the floor dry. Clean up any water  that spills on the floor as soon as it happens. Remove soap buildup in the tub or shower regularly. Attach bath mats securely with double-sided non-slip rug tape. Do not have throw rugs and other things on the floor that can make you trip. What can I do  in the bedroom? Use night lights. Make sure that you have a light by your bed that is easy to reach. Do not use any sheets or blankets that are too big for your bed. They should not hang down onto the floor. Have a firm chair that has side arms. You can use this for support while you get dressed. Do not have throw rugs and other things on the floor that can make you trip. What can I do in the kitchen? Clean up any spills right away. Avoid walking on wet floors. Keep items that you use a lot in easy-to-reach places. If you need to reach something above you, use a strong step stool that has a grab bar. Keep electrical cords out of the way. Do not use floor polish or wax that makes floors slippery. If you must use wax, use non-skid floor wax. Do not have throw rugs and other things on the floor that can make you trip. What can I do with my stairs? Do not leave any items on the stairs. Make sure that there are handrails on both sides of the stairs and use them. Fix handrails that are broken or loose. Make sure that handrails are as long as the stairways. Check any carpeting to make sure that it is firmly attached to the stairs. Fix any carpet that is loose or worn. Avoid having throw rugs at the top or bottom of the stairs. If you do have throw rugs, attach them to the floor with carpet tape. Make sure that you have a light switch at the top of the stairs and the bottom of the stairs. If you do not have them, ask someone to add them for you. What else can I do to help prevent falls? Wear shoes that: Do not have high heels. Have rubber bottoms. Are comfortable and fit you well. Are closed at the toe. Do not wear sandals. If you use a stepladder: Make sure that it is fully opened. Do not climb a closed stepladder. Make sure that both sides of the stepladder are locked into place. Ask someone to hold it for you, if possible. Clearly mark and make sure that you can see: Any grab bars or  handrails. First and last steps. Where the edge of each step is. Use tools that help you move around (mobility aids) if they are needed. These include: Canes. Walkers. Scooters. Crutches. Turn on the lights when you go into a dark area. Replace any light bulbs as soon as they burn out. Set up your furniture so you have a clear path. Avoid moving your furniture around. If any of your floors are uneven, fix them. If there are any pets around you, be aware of where they are. Review your medicines with your doctor. Some medicines can make you feel dizzy. This can increase your chance of falling. Ask your doctor what  other things that you can do to help prevent falls. This information is not intended to replace advice given to you by your health care provider. Make sure you discuss any questions you have with your health care provider. Document Released: 04/01/2009 Document Revised: 11/11/2015 Document Reviewed: 07/10/2014 Elsevier Interactive Patient Education  2017 [employer].

## 2024-06-25 NOTE — Progress Notes (Signed)
 "  Chief Complaint  Patient presents with   Medicare Wellness     Subjective:   JAJAIRA RUIS is a 84 y.o. female who presents for a Medicare Annual Wellness Visit.  No voiced or noted concerns at this time     Visit info / Clinical Intake: Medicare Wellness Visit Type:: Subsequent Annual Wellness Visit Persons participating in visit and providing information:: patient Medicare Wellness Visit Mode:: Telephone If telephone:: video declined Since this visit was completed virtually, some vitals may be partially provided or unavailable. Missing vitals are due to the limitations of the virtual format.: Unable to obtain vitals - no equipment If Telephone or Video please confirm:: I connected with patient using audio/video enable telemedicine. I verified patient identity with two identifiers, discussed telehealth limitations, and patient agreed to proceed. Patient Location:: home Provider Location:: office Interpreter Needed?: No Pre-visit prep was completed: no AWV questionnaire completed by patient prior to visit?: no Living arrangements:: (!) lives alone Patient's Overall Health Status Rating: good Typical amount of pain: none Does pain affect daily life?: no Are you currently prescribed opioids?: no  Dietary Habits and Nutritional Risks How many meals a day?: 3 Eats fruit and vegetables daily?: yes Most meals are obtained by: preparing own meals In the last 2 weeks, have you had any of the following?: none Diabetic:: no  Functional Status Activities of Daily Living (to include ambulation/medication): Independent Ambulation: Independent Medication Administration: Independent Home Management (perform basic housework or laundry): Independent Primary transportation is: driving Concerns about vision?: no *vision screening is required for WTM* Concerns about hearing?: no  Fall Screening Falls in the past year?: 0 Number of falls in past year: 0 Was there an injury with  Fall?: 0 Fall Risk Category Calculator: 0 Patient Fall Risk Level: Low Fall Risk  Fall Risk Patient at Risk for Falls Due to: No Fall Risks Fall risk Follow up: Falls evaluation completed; Education provided; Falls prevention discussed  Home and Transportation Safety: All rugs have non-skid backing?: yes All stairs or steps have railings?: N/A, no stairs Grab bars in the bathtub or shower?: yes Have non-skid surface in bathtub or shower?: yes Good home lighting?: yes Regular seat belt use?: yes Hospital stays in the last year:: no  Cognitive Assessment Difficulty concentrating, remembering, or making decisions? : no Will 6CIT or Mini Cog be Completed: yes What year is it?: 0 points What month is it?: 0 points Give patient an address phrase to remember (5 components): Its very sunny outside today in January About what time is it?: 0 points Count backwards from 20 to 1: 0 points Say the months of the year in reverse: 0 points Repeat the address phrase from earlier: 0 points 6 CIT Score: 0 points  Advance Directives (For Healthcare) Does Patient Have a Medical Advance Directive?: Yes Does patient want to make changes to medical advance directive?: No - Patient declined Type of Advance Directive: Healthcare Power of Attorney Copy of Healthcare Power of Attorney in Chart?: No - copy requested  Reviewed/Updated  Reviewed/Updated: Reviewed All (Medical, Surgical, Family, Medications, Allergies, Care Teams, Patient Goals); Surgical History; Family History; Medications; Allergies; Care Teams; Patient Goals; Medical History    Allergies (verified) Papain, Papaya derivatives, Mushroom ext cmplx(shiitake-reishi-mait), Mushroom, Mushroom extract complex (obsolete), and Venlafaxine    Current Medications (verified) Outpatient Encounter Medications as of 06/25/2024  Medication Sig   albuterol  (VENTOLIN  HFA) 108 (90 Base) MCG/ACT inhaler Inhale 2 puffs into the lungs every 6 (six) hours as  needed  for wheezing or shortness of breath.   atorvastatin  (LIPITOR) 20 MG tablet TAKE ONE (1) TABLET BY MOUTH EACH DAY   Biotin  5000 MCG CAPS Take 5,000 mcg by mouth daily.   Budeson-Glycopyrrol-Formoterol  (BREZTRI  AEROSPHERE) 160-9-4.8 MCG/ACT AERO Inhale 2 puffs into the lungs in the morning and at bedtime.   citalopram  (CELEXA ) 40 MG tablet TAKE ONE (1) TABLET BY MOUTH EVERY DAY   famotidine  (PEPCID ) 40 MG tablet TAKE ONE (1) TABLET BY MOUTH EVERY DAY   fenofibrate  (TRICOR ) 145 MG tablet TAKE 1 TABLET BY MOUTH EVERY MORNING AFTER BREAKFAST   LUMIGAN 0.01 % SOLN Place 1 drop into both eyes at bedtime.    Melatonin 10 MG TABS Take by mouth.   montelukast  (SINGULAIR ) 10 MG tablet TAKE 1 TABLET BY MOUTH AT BEDTIME   Multiple Vitamins-Minerals (CENTRUM SILVER PO) Take 1 tablet by mouth daily.   mupirocin  ointment (BACTROBAN ) 2 % Apply to incision twice daily until completely healed   nystatin  ointment (MYCOSTATIN ) APPLY TOPICALLY TWICE DAILY   Semaglutide ,0.25 or 0.5MG /DOS, (OZEMPIC , 0.25 OR 0.5 MG/DOSE,) 2 MG/3ML SOPN Inject 0.25 mg into the skin once a week.   alendronate  (FOSAMAX ) 70 MG tablet Take 1 tablet (70 mg total) by mouth every 7 (seven) days. Take with a full glass of water  on an empty stomach. (Patient not taking: Reported on 06/25/2024)   azelastine  (ASTELIN ) 0.1 % nasal spray Place 1 spray into both nostrils 2 (two) times daily. Use in each nostril as directed (Patient not taking: Reported on 06/25/2024)   budesonide  (ENTOCORT EC ) 3 MG 24 hr capsule TAKE ONE CAPSULE BY MOUTH DAILY (Patient not taking: Reported on 06/25/2024)   CALCIUM  PO Take 1 capsule by mouth daily. (Patient not taking: Reported on 06/25/2024)   Clobetasol  Propionate 0.05 % shampoo Wash hair as needed and leave on for 10-15 minutes before rinsing (Patient not taking: Reported on 06/25/2024)   dicyclomine  (BENTYL ) 20 MG tablet TAKE ONE (1) TABLET BY MOUTH 3 TIMES DAILY BEFORE MEALS (Patient not taking: Reported on 06/25/2024)    ibuprofen (ADVIL) 200 MG tablet Take 200 mg by mouth every 6 (six) hours as needed. (Patient not taking: Reported on 06/25/2024)   losartan  (COZAAR ) 25 MG tablet Take 1 tablet (25 mg total) by mouth daily. (Patient not taking: Reported on 06/25/2024)   omeprazole  (PRILOSEC) 40 MG capsule TAKE 1 CAPSULE BY MOUTH EVERY DAY (Patient not taking: Reported on 06/25/2024)   pantoprazole  (PROTONIX ) 40 MG tablet TAKE ONE (1) TABLET BY MOUTH EVERY DAY (Patient not taking: Reported on 06/25/2024)   predniSONE  (DELTASONE ) 10 MG tablet 3 tabs x3 days and then 2 tabs x3 days and then 1 tab x3 days.  Take w/ food. (Patient not taking: Reported on 06/25/2024)   No facility-administered encounter medications on file as of 06/25/2024.    History: Past Medical History:  Diagnosis Date   Allergic rhinitis    Anxiety    Asthma    Dysrhythmia    palpitations   Glaucoma    History of kidney stones    Hyperlipemia    Migraines    Palpitations    Pneumonia    hx   Urinary tract infection    Past Surgical History:  Procedure Laterality Date   ABDOMINAL HYSTERECTOMY     Has ovaries   BACK SURGERY  1987   lumb   BREAST SURGERY Left 07/2016   bx- needle in breast   CARPAL TUNNEL RELEASE Right 04/05/2022   Procedure: RIGHT CARPAL TUNNEL  RELEASE;  Surgeon: Jerri Kay HERO, MD;  Location: [facility];  Service: Orthopedics;  Laterality: Right;   CARPAL TUNNEL RELEASE Left 06/28/2022   Procedure: LEFT CARPAL TUNNEL RELEASE;  Surgeon: Jerri Kay HERO, MD;  Location: [facility];  Service: Orthopedics;  Laterality: Left;   CHOLECYSTECTOMY  09/26/13   CLOSED REDUCTION NASAL FRACTURE  04/29/2012   Procedure: CLOSED REDUCTION NASAL FRACTURE;  Surgeon: Marlyce Finer, MD;  Location: [facility];  Service: ENT;  Laterality: N/A;  WITH STABILIZATION   COLONOSCOPY     Deviated septum repair     EYE SURGERY Bilateral    cataracts   FOOT SURGERY Bilateral    ingrown toe nails    LITHOTRIPSY     REVERSE SHOULDER ARTHROPLASTY Right 09/15/2016   Procedure: RIGHT REVERSE SHOULDER ARTHROPLASTY;  Surgeon: Marcey Her, MD;  Location: [facility] OR;  Service: Orthopedics;  Laterality: Right;   REVERSE SHOULDER ARTHROPLASTY Left 10/15/2020   Procedure: REVERSE SHOULDER ARTHROPLASTY;  Surgeon: Her Marcey, MD;  Location: [facility];  Service: Orthopedics;  Laterality: Left;   ROTATOR CUFF REPAIR     right   TONSILLECTOMY     Family History  Problem Relation Age of Onset   Diabetes Father    Colon cancer Neg Hx    Esophageal cancer Neg Hx    Rectal cancer Neg Hx    Stomach cancer Neg Hx    Breast cancer Neg Hx    Social History   Occupational History   Occupation: Retired -Psychiatrist: RETIRED  Tobacco Use   Smoking status: Former    Current packs/day: 0.00    Average packs/day: 0.1 packs/day for 4.0 years (0.4 ttl pk-yrs)    Types: Cigarettes    Start date: 06/19/1982    Quit date: 06/19/1986    Years since quitting: 38.0   Smokeless tobacco: Never   Tobacco comments:    smoked in college years ago  Vaping Use   Vaping status: Never Used  Substance and Sexual Activity   Alcohol use: Yes    Comment: 1-2 glasses week variety beer occ scotch   Drug use: No   Sexual activity: Not Currently    Birth control/protection: Surgical   Tobacco Counseling Counseling given: Not Answered Tobacco comments: smoked in college years ago  SDOH Screenings   Food Insecurity: No Food Insecurity (06/25/2024)  Housing: Unknown (06/25/2024)  Transportation Needs: No Transportation Needs (06/25/2024)  Utilities: Not At Risk (06/25/2024)  Alcohol Screen: Low Risk (10/31/2023)  Depression (PHQ2-9): Low Risk (06/25/2024)  Financial Resource Strain: Low Risk (10/31/2023)  Physical Activity: Sufficiently Active (06/25/2024)  Social Connections: Moderately Integrated (06/25/2024)  Stress: No Stress Concern Present (06/25/2024)  Tobacco Use: Medium Risk (06/25/2024)  Health Literacy: Adequate  Health Literacy (06/25/2024)   See flowsheets for full screening details  Depression Screen PHQ 2 & 9 Depression Scale- Over the past 2 weeks, how often have you been bothered by any of the following problems? Little interest or pleasure in doing things: 0 Feeling down, depressed, or hopeless (PHQ Adolescent also includes...irritable): 0 PHQ-2 Total Score: 0 Trouble falling or staying asleep, or sleeping too much: 0 Feeling tired or having little energy: 0 Poor appetite or overeating (PHQ Adolescent also includes...weight loss): 0 Feeling bad about yourself - or that you are a failure or have let yourself or your family down: 0 Trouble concentrating on things, such as reading the newspaper or watching television (PHQ Adolescent also includes...like school  work): 0 Moving or speaking so slowly that other people could have noticed. Or the opposite - being so fidgety or restless that you have been moving around a lot more than usual: 0 Thoughts that you would be better off dead, or of hurting yourself in some way: 0 PHQ-9 Total Score: 0 If you checked off any problems, how difficult have these problems made it for you to do your work, take care of things at home, or get along with other people?: Not difficult at all     Goals Addressed             This Visit's Progress    Weight (lb) < 200 lb (90.7 kg)   145 lb (65.8 kg)            Objective:    Today's Vitals   06/25/24 1129  Weight: 145 lb (65.8 kg)  Height: 5' 3 (1.6 m)   Body mass index is 25.69 kg/m.  Hearing/Vision screen Hearing Screening - Comments:: No trouble hearing Vision Screening - Comments:: Up to date [employer]  Immunizations and Health Maintenance Health Maintenance  Topic Date Due   DTaP/Tdap/Td (2 - Td or Tdap) 04/20/2022   OPHTHALMOLOGY EXAM  10/26/2023   COVID-19 Vaccine (8 - 2025-26 season) 02/18/2024   Mammogram  08/12/2024   HEMOGLOBIN A1C  08/21/2024   Diabetic  kidney evaluation - eGFR measurement  02/21/2025   Diabetic kidney evaluation - Urine ACR  05/30/2025   FOOT EXAM  05/30/2025   Medicare Annual Wellness (AWV)  06/25/2025   Pneumococcal Vaccine: 50+ Years  Completed   Influenza Vaccine  Completed   Bone Density Scan  Completed   Zoster Vaccines- Shingrix  Completed   Meningococcal B Vaccine  Aged Out        Assessment/Plan:  This is a routine wellness examination for [place].  Patient Care Team: Mahlon Comer BRAVO, MD as PCP - General (Family Medicine) Tory Kirsch, AUD (Audiology) Kay Kemps, MD as Consulting Physician (Orthopedic Surgery) Hollar, Lahoma Greener, MD as Referring Physician (Dermatology) Teressa Toribio SQUIBB, MD (Inactive) as Attending Physician (Gastroenterology) Nathanael Deatrice Barrio, MD as Referring Physician (Pulmonary Disease) Camillo Golas, MD as Consulting Physician (Ophthalmology) Lynnette Dunnings, MD (General Practice) Tobie Grange, MD as Referring Physician (Endocrinology) Ottelin, Mark, MD (Inactive) as Consulting Physician (Urology) Pandora Cadet, [facility] as Pharmacist (Pharmacist)  I have personally reviewed and noted the following in the patients chart:   Medical and social history Use of alcohol, tobacco or illicit drugs  Current medications and supplements including opioid prescriptions. Functional ability and status Nutritional status Physical activity Advanced directives List of other physicians Hospitalizations, surgeries, and ER visits in previous 12 months Vitals Screenings to include cognitive, depression, and falls Referrals and appointments  No orders of the defined types were placed in this encounter.  In addition, I have reviewed and discussed with patient certain preventive protocols, quality metrics, and best practice recommendations. A written personalized care plan for preventive services as well as general preventive health recommendations were provided to patient.   Mliss Graff, LPN   01/19/7972   Return in 1 year (on 06/25/2025).  After Visit Summary: (MyChart) Due to this being a telephonic visit, the after visit summary with patients personalized plan was offered to patient via MyChart   Nurse Notes:   Chief Complaint  Patient presents with   Medicare Wellness     Subjective:   Rock  JACLIN FINKS is a 84 y.o. female who presents for a Medicare Annual Wellness Visit.  Visit info / Clinical Intake: Medicare Wellness Visit Type:: Subsequent Annual Wellness Visit Persons participating in visit and providing information:: patient Medicare Wellness Visit Mode:: Telephone If telephone:: video declined Since this visit was completed virtually, some vitals may be partially provided or unavailable. Missing vitals are due to the limitations of the virtual format.: Unable to obtain vitals - no equipment If Telephone or Video please confirm:: I connected with patient using audio/video enable telemedicine. I verified patient identity with two identifiers, discussed telehealth limitations, and patient agreed to proceed. Patient Location:: home Provider Location:: office Interpreter Needed?: No Pre-visit prep was completed: no AWV questionnaire completed by patient prior to visit?: no Living arrangements:: (!) lives alone Patient's Overall Health Status Rating: good Typical amount of pain: none Does pain affect daily life?: no Are you currently prescribed opioids?: no  Dietary Habits and Nutritional Risks How many meals a day?: 3 Eats fruit and vegetables daily?: yes Most meals are obtained by: preparing own meals In the last 2 weeks, have you had any of the following?: none Diabetic:: no  Functional Status Activities of Daily Living (to include ambulation/medication): Independent Ambulation: Independent Medication Administration: Independent Home Management (perform basic housework or laundry): Independent Primary transportation is: driving Concerns about  vision?: no *vision screening is required for WTM* Concerns about hearing?: no  Fall Screening Falls in the past year?: 0 Number of falls in past year: 0 Was there an injury with Fall?: 0 Fall Risk Category Calculator: 0 Patient Fall Risk Level: Low Fall Risk  Fall Risk Patient at Risk for Falls Due to: No Fall Risks Fall risk Follow up: Falls evaluation completed; Education provided; Falls prevention discussed  Home and Transportation Safety: All rugs have non-skid backing?: yes All stairs or steps have railings?: N/A, no stairs Grab bars in the bathtub or shower?: yes Have non-skid surface in bathtub or shower?: yes Good home lighting?: yes Regular seat belt use?: yes Hospital stays in the last year:: no  Cognitive Assessment Difficulty concentrating, remembering, or making decisions? : no Will 6CIT or Mini Cog be Completed: yes What year is it?: 0 points What month is it?: 0 points Give patient an address phrase to remember (5 components): Its very sunny outside today in January About what time is it?: 0 points Count backwards from 20 to 1: 0 points Say the months of the year in reverse: 0 points Repeat the address phrase from earlier: 0 points 6 CIT Score: 0 points  Advance Directives (For Healthcare) Does Patient Have a Medical Advance Directive?: Yes Does patient want to make changes to medical advance directive?: No - Patient declined Type of Advance Directive: Healthcare Power of Attorney Copy of Healthcare Power of Attorney in Chart?: No - copy requested  Reviewed/Updated  Reviewed/Updated: Reviewed All (Medical, Surgical, Family, Medications, Allergies, Care Teams, Patient Goals); Surgical History; Family History; Medications; Allergies; Care Teams; Patient Goals; Medical History    Allergies (verified) Papain, Papaya derivatives, Mushroom ext cmplx(shiitake-reishi-mait), Mushroom, Mushroom extract complex (obsolete), and Venlafaxine    Current Medications  (verified) Outpatient Encounter Medications as of 06/25/2024  Medication Sig   albuterol  (VENTOLIN  HFA) 108 (90 Base) MCG/ACT inhaler Inhale 2 puffs into the lungs every 6 (six) hours as needed for wheezing or shortness of breath.   atorvastatin  (LIPITOR) 20 MG tablet TAKE ONE (1) TABLET BY MOUTH EACH DAY   Biotin  5000 MCG CAPS Take 5,000 mcg by mouth  daily.   Budeson-Glycopyrrol-Formoterol  (BREZTRI  AEROSPHERE) 160-9-4.8 MCG/ACT AERO Inhale 2 puffs into the lungs in the morning and at bedtime.   citalopram  (CELEXA ) 40 MG tablet TAKE ONE (1) TABLET BY MOUTH EVERY DAY   famotidine  (PEPCID ) 40 MG tablet TAKE ONE (1) TABLET BY MOUTH EVERY DAY   fenofibrate  (TRICOR ) 145 MG tablet TAKE 1 TABLET BY MOUTH EVERY MORNING AFTER BREAKFAST   LUMIGAN 0.01 % SOLN Place 1 drop into both eyes at bedtime.    Melatonin 10 MG TABS Take by mouth.   montelukast  (SINGULAIR ) 10 MG tablet TAKE 1 TABLET BY MOUTH AT BEDTIME   Multiple Vitamins-Minerals (CENTRUM SILVER PO) Take 1 tablet by mouth daily.   mupirocin  ointment (BACTROBAN ) 2 % Apply to incision twice daily until completely healed   nystatin  ointment (MYCOSTATIN ) APPLY TOPICALLY TWICE DAILY   Semaglutide ,0.25 or 0.5MG /DOS, (OZEMPIC , 0.25 OR 0.5 MG/DOSE,) 2 MG/3ML SOPN Inject 0.25 mg into the skin once a week.   alendronate  (FOSAMAX ) 70 MG tablet Take 1 tablet (70 mg total) by mouth every 7 (seven) days. Take with a full glass of water  on an empty stomach. (Patient not taking: Reported on 06/25/2024)   azelastine  (ASTELIN ) 0.1 % nasal spray Place 1 spray into both nostrils 2 (two) times daily. Use in each nostril as directed (Patient not taking: Reported on 06/25/2024)   budesonide  (ENTOCORT EC ) 3 MG 24 hr capsule TAKE ONE CAPSULE BY MOUTH DAILY (Patient not taking: Reported on 06/25/2024)   CALCIUM  PO Take 1 capsule by mouth daily. (Patient not taking: Reported on 06/25/2024)   Clobetasol  Propionate 0.05 % shampoo Wash hair as needed and leave on for 10-15 minutes before  rinsing (Patient not taking: Reported on 06/25/2024)   dicyclomine  (BENTYL ) 20 MG tablet TAKE ONE (1) TABLET BY MOUTH 3 TIMES DAILY BEFORE MEALS (Patient not taking: Reported on 06/25/2024)   ibuprofen (ADVIL) 200 MG tablet Take 200 mg by mouth every 6 (six) hours as needed. (Patient not taking: Reported on 06/25/2024)   losartan  (COZAAR ) 25 MG tablet Take 1 tablet (25 mg total) by mouth daily. (Patient not taking: Reported on 06/25/2024)   omeprazole  (PRILOSEC) 40 MG capsule TAKE 1 CAPSULE BY MOUTH EVERY DAY (Patient not taking: Reported on 06/25/2024)   pantoprazole  (PROTONIX ) 40 MG tablet TAKE ONE (1) TABLET BY MOUTH EVERY DAY (Patient not taking: Reported on 06/25/2024)   predniSONE  (DELTASONE ) 10 MG tablet 3 tabs x3 days and then 2 tabs x3 days and then 1 tab x3 days.  Take w/ food. (Patient not taking: Reported on 06/25/2024)   No facility-administered encounter medications on file as of 06/25/2024.    History: Past Medical History:  Diagnosis Date   Allergic rhinitis    Anxiety    Asthma    Dysrhythmia    palpitations   Glaucoma    History of kidney stones    Hyperlipemia    Migraines    Palpitations    Pneumonia    hx   Urinary tract infection    Past Surgical History:  Procedure Laterality Date   ABDOMINAL HYSTERECTOMY     Has ovaries   BACK SURGERY  1987   lumb   BREAST SURGERY Left 07/2016   bx- needle in breast   CARPAL TUNNEL RELEASE Right 04/05/2022   Procedure: RIGHT CARPAL TUNNEL RELEASE;  Surgeon: Jerri Kay HERO, MD;  Location: [facility];  Service: Orthopedics;  Laterality: Right;   CARPAL TUNNEL RELEASE Left 06/28/2022   Procedure: LEFT CARPAL TUNNEL  RELEASE;  Surgeon: Jerri Kay HERO, MD;  Location: [facility];  Service: Orthopedics;  Laterality: Left;   CHOLECYSTECTOMY  09/26/13   CLOSED REDUCTION NASAL FRACTURE  04/29/2012   Procedure: CLOSED REDUCTION NASAL FRACTURE;  Surgeon: Marlyce Finer, MD;  Location: [facility];  Service:  ENT;  Laterality: N/A;  WITH STABILIZATION   COLONOSCOPY     Deviated septum repair     EYE SURGERY Bilateral    cataracts   FOOT SURGERY Bilateral    ingrown toe nails   LITHOTRIPSY     REVERSE SHOULDER ARTHROPLASTY Right 09/15/2016   Procedure: RIGHT REVERSE SHOULDER ARTHROPLASTY;  Surgeon: Marcey Her, MD;  [facility];  Service: Orthopedics;  Laterality: Right;   REVERSE SHOULDER ARTHROPLASTY Left 10/15/2020   Procedure: REVERSE SHOULDER ARTHROPLASTY;  Surgeon: Her Marcey, MD;  Location: [facility];  Service: Orthopedics;  Laterality: Left;   ROTATOR CUFF REPAIR     right   TONSILLECTOMY     Family History  Problem Relation Age of Onset   Diabetes Father    Colon cancer Neg Hx    Esophageal cancer Neg Hx    Rectal cancer Neg Hx    Stomach cancer Neg Hx    Breast cancer Neg Hx    Social History   Occupational History   Occupation: Retired -Psychiatrist: RETIRED  Tobacco Use   Smoking status: Former    Current packs/day: 0.00    Average packs/day: 0.1 packs/day for 4.0 years (0.4 ttl pk-yrs)    Types: Cigarettes    Start date: 06/19/1982    Quit date: 06/19/1986    Years since quitting: 38.0   Smokeless tobacco: Never   Tobacco comments:    smoked in college years ago  Vaping Use   Vaping status: Never Used  Substance and Sexual Activity   Alcohol use: Yes    Comment: 1-2 glasses week variety beer occ scotch   Drug use: No   Sexual activity: Not Currently    Birth control/protection: Surgical   Tobacco Counseling Counseling given: Not Answered Tobacco comments: smoked in college years ago  SDOH Screenings   Food Insecurity: No Food Insecurity (06/25/2024)  Housing: Unknown (06/25/2024)  Transportation Needs: No Transportation Needs (06/25/2024)  Utilities: Not At Risk (06/25/2024)  Alcohol Screen: Low Risk (10/31/2023)  Depression (PHQ2-9): Low Risk (06/25/2024)  Financial Resource Strain: Low Risk (10/31/2023)  Physical Activity: Sufficiently Active  (06/25/2024)  Social Connections: Moderately Integrated (06/25/2024)  Stress: No Stress Concern Present (06/25/2024)  Tobacco Use: Medium Risk (06/25/2024)  Health Literacy: Adequate Health Literacy (06/25/2024)   See flowsheets for full screening details  Depression Screen PHQ 2 & 9 Depression Scale- Over the past 2 weeks, how often have you been bothered by any of the following problems? Little interest or pleasure in doing things: 0 Feeling down, depressed, or hopeless (PHQ Adolescent also includes...irritable): 0 PHQ-2 Total Score: 0 Trouble falling or staying asleep, or sleeping too much: 0 Feeling tired or having little energy: 0 Poor appetite or overeating (PHQ Adolescent also includes...weight loss): 0 Feeling bad about yourself - or that you are a failure or have let yourself or your family down: 0 Trouble concentrating on things, such as reading the newspaper or watching television (PHQ Adolescent also includes...like school work): 0 Moving or speaking so slowly that other people could have noticed. Or the opposite - being so fidgety or restless that you have been moving around a lot more than  usual: 0 Thoughts that you would be better off dead, or of hurting yourself in some way: 0 PHQ-9 Total Score: 0 If you checked off any problems, how difficult have these problems made it for you to do your work, take care of things at home, or get along with other people?: Not difficult at all     Goals Addressed             This Visit's Progress    Weight (lb) < 200 lb (90.7 kg)   145 lb (65.8 kg)            Objective:    Today's Vitals   06/25/24 1129  Weight: 145 lb (65.8 kg)  Height: 5' 3 (1.6 m)   Body mass index is 25.69 kg/m.  Hearing/Vision screen Hearing Screening - Comments:: No trouble hearing Vision Screening - Comments:: Up to date [employer]  Immunizations and Health Maintenance Health Maintenance  Topic Date Due   DTaP/Tdap/Td (2  - Td or Tdap) 04/20/2022   OPHTHALMOLOGY EXAM  10/26/2023   COVID-19 Vaccine (8 - 2025-26 season) 02/18/2024   Mammogram  08/12/2024   HEMOGLOBIN A1C  08/21/2024   Diabetic kidney evaluation - eGFR measurement  02/21/2025   Diabetic kidney evaluation - Urine ACR  05/30/2025   FOOT EXAM  05/30/2025   Medicare Annual Wellness (AWV)  06/25/2025   Pneumococcal Vaccine: 50+ Years  Completed   Influenza Vaccine  Completed   Bone Density Scan  Completed   Zoster Vaccines- Shingrix  Completed   Meningococcal B Vaccine  Aged Out        Assessment/Plan:  This is a routine wellness examination for Morley.  Patient Care Team: Mahlon Comer BRAVO, MD as PCP - General (Family Medicine) Tory Kirsch, AUD (Audiology) Kay Kemps, MD as Consulting Physician (Orthopedic Surgery) Hollar, Lahoma Greener, MD as Referring Physician (Dermatology) Teressa Toribio SQUIBB, MD (Inactive) as Attending Physician (Gastroenterology) Nathanael Deatrice Barrio, MD as Referring Physician (Pulmonary Disease) Camillo Golas, MD as Consulting Physician (Ophthalmology) Lynnette Dunnings, MD (General Practice) Tobie Grange, MD as Referring Physician (Endocrinology) Ottelin, Mark, MD (Inactive) as Consulting Physician (Urology) Pandora Cadet, [facility] as Pharmacist (Pharmacist)  I have personally reviewed and noted the following in the patients chart:   Medical and social history Use of alcohol, tobacco or illicit drugs  Current medications and supplements including opioid prescriptions. Functional ability and status Nutritional status Physical activity Advanced directives List of other physicians Hospitalizations, surgeries, and ER visits in previous 12 months Vitals Screenings to include cognitive, depression, and falls Referrals and appointments  No orders of the defined types were placed in this encounter.  In addition, I have reviewed and discussed with patient certain preventive protocols, quality metrics, and  best practice recommendations. A written personalized care plan for preventive services as well as general preventive health recommendations were provided to patient.   Mliss Graff, LPN   01/19/7972   Return in 1 year (on 06/25/2025).  After Visit Summary:   Nurse Notes:  "

## 2024-06-26 ENCOUNTER — Encounter (INDEPENDENT_AMBULATORY_CARE_PROVIDER_SITE_OTHER): Payer: Self-pay | Admitting: Otolaryngology

## 2024-06-26 ENCOUNTER — Ambulatory Visit (INDEPENDENT_AMBULATORY_CARE_PROVIDER_SITE_OTHER): Admitting: Otolaryngology

## 2024-06-26 VITALS — BP 131/62 | HR 97 | Ht 63.0 in | Wt 135.0 lb

## 2024-06-26 DIAGNOSIS — J0181 Other acute recurrent sinusitis: Secondary | ICD-10-CM

## 2024-06-26 DIAGNOSIS — Z9889 Other specified postprocedural states: Secondary | ICD-10-CM

## 2024-06-26 DIAGNOSIS — R0982 Postnasal drip: Secondary | ICD-10-CM | POA: Diagnosis not present

## 2024-06-26 DIAGNOSIS — R0981 Nasal congestion: Secondary | ICD-10-CM

## 2024-06-26 MED ORDER — AZELASTINE HCL 0.1 % NA SOLN: 2.0000 | Freq: Two times a day (BID) | NASAL | 12 refills | Status: AC | PRN

## 2024-06-26 NOTE — Progress Notes (Signed)
 Dear Dr. Mahlon, Here is my assessment for our mutual patient, [name]. Thank you for allowing me the opportunity to care for your patient. Please do not hesitate to contact me should you have any other questions. Sincerely, Dr. Eldora Blanch  Otolaryngology Clinic Note  HISTORY: [name] is a 84 y.o. female referred by Dr. Mahlon for evaluation of sinusitis with possible nasal polyposis  Initial visit (06/2024): Discussed the use of AI scribe software for clinical note transcription with the patient, who gave verbal consent to proceed.  History of Present Illness [name] is an 84 year old female with prior nasal polyps and deviated septum surgery who presents for evaluation of possible recurrent nasal polyps following a recent sinus infection.  She previously had septoplasty with removal of multiple nasal polyps, which resolved prior breathing difficulty, headaches, and nausea. She had very infrequent sinus infections for 30 to 40 years until the recent episode.  Five to six weeks ago she developed an influenza-like illness that progressed to a painful sinus infection, which resolved after antibiotics and a five-day steroid course. Since then she has had intermittent rhinorrhea and suspects ongoing drainage but has no facial pain, pressure, or nasal obstruction. She is worried about recurrent polyps and is apprehensive about further surgery because of her age.  She does not use regular nasal medications and uses saline spray only intermittently. Her sense of smell is intact.   She has asthma and prior bronchitis episodes after influenza but notes improved respiratory symptoms since moving from [place]. She quit smoking in her twenties. Overall doing failry well today.  Allergy testing has been done She is currently using saline spray as needed  ENT Surgery: Septoplasty and Nasal Polyp Surgery (many years ago)  AP/AC: no Tobacco: remote, quit  PMHx: OAG, DM2,  Asthma  RADIOGRAPHIC EVALUATION AND INDEPENDENT REVIEW OF OTHER RECORDS:: Dr. Mahlon (06/16/2024): noted regrown polyps in nose; Dx: CRSwNP(?); Rx: ref to ENT Dr. Mahlon (05/13/2024): noted bacterial sinusitis; Rx: amox Dr. Mahlon 05/30/2024: Sinus congestion, infxn resolved but some fullness and pressure; Rx: prednisone  Dr. Nathanael (03/28/2022): Moderate asthma, chronic bronchitis but normal imaging and breathing testing with PFTs in November 2019 showing an FEV1 and FVC both above 80% with normal DLCO  CBC and BMP 02/22/2024: BUN/Cr 27/1.02; WBC 6.2; Eos 100 CT Face 04/20/2012 independently interpreted: prior b/l max, partial ethmoidectomy, bilateral sphenoid and frontals with mild MPT; mild MPT around max; septum relativley midline  Past Medical History:  Diagnosis Date   Allergic rhinitis    Anxiety    Asthma    Dysrhythmia    palpitations   Glaucoma    History of kidney stones    Hyperlipemia    Migraines    Palpitations    Pneumonia    hx   Urinary tract infection    Past Surgical History:  Procedure Laterality Date   ABDOMINAL HYSTERECTOMY     Has ovaries   BACK SURGERY  1987   lumb   BREAST SURGERY Left 07/2016   bx- needle in breast   CARPAL TUNNEL RELEASE Right 04/05/2022   Procedure: RIGHT CARPAL TUNNEL RELEASE;  Surgeon: Jerri Kay CHRISTELLA, MD;  Location: [facility];  Service: Orthopedics;  Laterality: Right;   CARPAL TUNNEL RELEASE Left 06/28/2022   Procedure: LEFT CARPAL TUNNEL RELEASE;  Surgeon: Jerri Kay CHRISTELLA, MD;  Location: [facility];  Service: Orthopedics;  Laterality: Left;   CHOLECYSTECTOMY  09/26/13   CLOSED REDUCTION NASAL FRACTURE  04/29/2012  Procedure: CLOSED REDUCTION NASAL FRACTURE;  Surgeon: Marlyce Finer, MD;  Location: [facility];  Service: ENT;  Laterality: N/A;  WITH STABILIZATION   COLONOSCOPY     Deviated septum repair     EYE SURGERY Bilateral    cataracts   FOOT SURGERY Bilateral    ingrown toe nails    LITHOTRIPSY     REVERSE SHOULDER ARTHROPLASTY Right 09/15/2016   Procedure: RIGHT REVERSE SHOULDER ARTHROPLASTY;  Surgeon: Marcey Her, MD;  Location: [facility];  Service: Orthopedics;  Laterality: Right;   REVERSE SHOULDER ARTHROPLASTY Left 10/15/2020   Procedure: REVERSE SHOULDER ARTHROPLASTY;  Surgeon: Her Marcey, MD;  Location: [facility];  Service: Orthopedics;  Laterality: Left;   ROTATOR CUFF REPAIR     right   TONSILLECTOMY     Family History  Problem Relation Age of Onset   Diabetes Father    Colon cancer Neg Hx    Esophageal cancer Neg Hx    Rectal cancer Neg Hx    Stomach cancer Neg Hx    Breast cancer Neg Hx    Social History   Tobacco Use   Smoking status: Former    Current packs/day: 0.00    Average packs/day: 0.1 packs/day for 4.0 years (0.4 ttl pk-yrs)    Types: Cigarettes    Start date: 06/19/1982    Quit date: 06/19/1986    Years since quitting: 38.0   Smokeless tobacco: Never   Tobacco comments:    smoked in college years ago  Substance Use Topics   Alcohol use: Yes    Comment: 1-2 glasses week variety beer occ scotch   Allergies[1] Current Outpatient Medications  Medication Sig Dispense Refill   albuterol  (VENTOLIN  HFA) 108 (90 Base) MCG/ACT inhaler Inhale 2 puffs into the lungs every 6 (six) hours as needed for wheezing or shortness of breath. 8 g 3   atorvastatin  (LIPITOR) 20 MG tablet TAKE ONE (1) TABLET BY MOUTH EACH DAY 90 tablet 1   azelastine  (ASTELIN ) 0.1 % nasal spray Place 2 sprays into both nostrils 2 (two) times daily as needed for rhinitis. Use in each nostril as directed 30 mL 12   Biotin  5000 MCG CAPS Take 5,000 mcg by mouth daily.     Budeson-Glycopyrrol-Formoterol  (BREZTRI  AEROSPHERE) 160-9-4.8 MCG/ACT AERO Inhale 2 puffs into the lungs in the morning and at bedtime. 10.7 g 6   citalopram  (CELEXA ) 40 MG tablet TAKE ONE (1) TABLET BY MOUTH EVERY DAY 90 tablet 1   famotidine  (PEPCID ) 40 MG tablet TAKE ONE (1) TABLET BY MOUTH EVERY DAY 30 tablet 3    fenofibrate  (TRICOR ) 145 MG tablet TAKE 1 TABLET BY MOUTH EVERY MORNING AFTER BREAKFAST 90 tablet 0   LUMIGAN 0.01 % SOLN Place 1 drop into both eyes at bedtime.      Melatonin 10 MG TABS Take by mouth.     montelukast  (SINGULAIR ) 10 MG tablet TAKE 1 TABLET BY MOUTH AT BEDTIME 90 tablet 3   Multiple Vitamins-Minerals (CENTRUM SILVER PO) Take 1 tablet by mouth daily.     mupirocin  ointment (BACTROBAN ) 2 % Apply to incision twice daily until completely healed 22 g 0   nystatin  ointment (MYCOSTATIN ) APPLY TOPICALLY TWICE DAILY 30 g 1   Semaglutide ,0.25 or 0.5MG /DOS, (OZEMPIC , 0.25 OR 0.5 MG/DOSE,) 2 MG/3ML SOPN Inject 0.25 mg into the skin once a week. 3 mL 2   alendronate  (FOSAMAX ) 70 MG tablet Take 1 tablet (70 mg total) by mouth every 7 (seven) days. Take with a full  glass of water  on an empty stomach. (Patient not taking: Reported on 06/26/2024) 12 tablet 3   budesonide  (ENTOCORT EC ) 3 MG 24 hr capsule TAKE ONE CAPSULE BY MOUTH DAILY (Patient not taking: Reported on 06/26/2024) 60 capsule 1   CALCIUM  PO Take 1 capsule by mouth daily. (Patient not taking: Reported on 06/26/2024)     Clobetasol  Propionate 0.05 % shampoo Wash hair as needed and leave on for 10-15 minutes before rinsing (Patient not taking: Reported on 06/26/2024) 118 mL 0   dicyclomine  (BENTYL ) 20 MG tablet TAKE ONE (1) TABLET BY MOUTH 3 TIMES DAILY BEFORE MEALS (Patient not taking: Reported on 06/26/2024) 90 tablet 3   ibuprofen (ADVIL) 200 MG tablet Take 200 mg by mouth every 6 (six) hours as needed. (Patient not taking: Reported on 06/26/2024)     losartan  (COZAAR ) 25 MG tablet Take 1 tablet (25 mg total) by mouth daily. (Patient not taking: Reported on 06/26/2024) 30 tablet 1   omeprazole  (PRILOSEC) 40 MG capsule TAKE 1 CAPSULE BY MOUTH EVERY DAY (Patient not taking: Reported on 06/26/2024) 30 capsule 2   pantoprazole  (PROTONIX ) 40 MG tablet TAKE ONE (1) TABLET BY MOUTH EVERY DAY (Patient not taking: Reported on 06/26/2024) 90 tablet 1    predniSONE  (DELTASONE ) 10 MG tablet 3 tabs x3 days and then 2 tabs x3 days and then 1 tab x3 days.  Take w/ food. (Patient not taking: Reported on 06/26/2024) 18 tablet 0   No current facility-administered medications for this visit.   BP 131/62 (BP Location: Left Arm, Patient Position: Sitting, Cuff Size: Normal)   Pulse 97   Ht 5' 3 (1.6 m)   Wt 135 lb (61.2 kg)   SpO2 97%   BMI 23.91 kg/m   PHYSICAL EXAM:  BP 131/62 (BP Location: Left Arm, Patient Position: Sitting, Cuff Size: Normal)   Pulse 97   Ht 5' 3 (1.6 m)   Wt 135 lb (61.2 kg)   SpO2 97%   BMI 23.91 kg/m    Salient findings:  CN II-XII intact Bilateral EAC clear and TM intact with well pneumatized middle ear spaces; b/l hearing aids in place Nose: Anterior rhinoscopy reveals septum relatively midline, bilateral inferior turbinates reduced.  Nasal endoscopy was indicated to better evaluate the nose and paranasal sinuses, given the patient's history and exam findings, and is detailed below. No lesions of oral cavity/oropharynx No obviously palpable neck masses/lymphadenopathy/thyromegaly No respiratory distress or stridor   PROCEDURE:  Prior to initiating any procedures, risks/benefits/alternatives were explained to the patient and verbal consent obtained. Diagnostic Nasal Endoscopy Pre-procedure diagnosis: Concern for sinusitis, history of nasal polyposis, prior history of endoscopic sinus surgery Post-procedure diagnosis: same Indication: See pre-procedure diagnosis and physical exam above Complications: None apparent EBL: 0 mL Anesthesia: Lidocaine  4% and topical decongestant was topically sprayed in each nasal cavity  Description of Procedure:  Patient was identified. A rigid 30 degree endoscope was utilized to evaluate the sinonasal cavities, mucosa, sinus ostia and turbinates and septum.  Overall, signs of mucosal inflammation are not noted.  No mucopurulence, polyps, or masses noted.   B/l MM and SE recess  clear; history of bilateral max and ethmoidectomy with patent max b/l with some scaring, no mucus   CPT CODE -- 31231 - Mod 25   ASSESSMENT:  84 y.o.  1. Nasal congestion   2. Post-nasal drip   3. Other acute recurrent sinusitis   4. S/P FESS (functional endoscopic sinus surgery)    Endo reassuring, no polyp  regrowth; recent sinusitis but doing better now after abx/steroids; history of recurrent sinusitis but now much less frequent; she was reassured re: polypsand happy to hear that.    - Recommend daily rinses if exacerbations - Can take astelin  PRN for congestion - f/u PRN  See below regarding exact medications prescribed this encounter including dosages and route: Meds ordered this encounter  Medications   azelastine  (ASTELIN ) 0.1 % nasal spray    Sig: Place 2 sprays into both nostrils 2 (two) times daily as needed for rhinitis. Use in each nostril as directed    Dispense:  30 mL    Refill:  12     Thank you for allowing me the opportunity to care for your patient. Please do not hesitate to contact me should you have any other questions.  Sincerely, Eldora Blanch, MD Otolaryngologist (ENT), [facility] Phone: [phone] Fax: [phone]  MDM:  Level 4: [phone] Complexity/Problems addressed: mod Data complexity: mod - independent interpretation of CT; review of notes, labs - Morbidity: mod  - Drug prescribed or managed: y  06/26/2024, 8:49 AM      [1]  Allergies Allergen Reactions   Papain Anaphylaxis    Takes an extreme concentration   Papaya Derivatives Anaphylaxis   Mushroom Ext Cmplx(Shiitake-Reishi-Mait) Other (See Comments)    Head congestion and sore throat   Mushroom Other (See Comments)    cultivated mushroom extract   Mushroom Extract Complex (Obsolete) Other (See Comments) and Nausea Only    Head congestion and sore throat after eating 2 days in row   Venlafaxine  Other (See Comments) and Rash    UNSPECIFIED REACTION  Pt states  she can take Name brand. Lethargy

## 2024-06-26 NOTE — Patient Instructions (Addendum)
 In case of congestion or allergies, you can take astelin  nasal spray two sprays each nostril twice daily as needed  A[facility] Med Nasal Saline Rinse  - start nasal saline rinses with NeilMed Bottle available over the counter    Nasal Saline Irrigation instructions: If you choose to make your own salt water  solution, You will need: Salt (kosher, canning, or pickling salt) Baking soda Nasal irrigation bottle (i.e. Aureliano Med Sinus Rinse) Measuring spoon ( teaspoon) Distilled / boiled water    Mix solution Mix 1 teaspoon of salt, 1/2 teaspoon of baking soda and 1 cup of water  into irrigation bottle ** May use saline packet instead of homemade recipe for this step if you prefer If medicine was prescribed to be mixed with solution, place this into bottle Examples 2 inches of 2% mupirocin  ointment Budesonide  solution Position your head: Lean over sink (about 45 degrees) Rotate head (about 45 degrees) so that one nostril is above the other Irrigate Insert tip of irrigation bottle into upper nostril so it forms a comfortable seal Irrigate while breathing through your mouth May remove the straw from the bottle in order to irrigate the entire solution (important if medicine was added) Exhale through nose when finished and blow nose as necessary  Repeat on opposite side with other 1/2 of solution (120 mL) or remake solution if all 240 mL was used on first side Wash irrigation bottle regularly, replace every 3 months

## 2024-07-07 ENCOUNTER — Other Ambulatory Visit: Payer: Self-pay | Admitting: Family Medicine
